# Patient Record
Sex: Female | Born: 1935
Health system: Southern US, Community
[De-identification: ages and names within clinical notes are randomized; demographics above are authoritative.]

## PROBLEM LIST (undated history)

## (undated) DIAGNOSIS — R011 Cardiac murmur, unspecified: Secondary | ICD-10-CM

## (undated) DIAGNOSIS — G4700 Insomnia, unspecified: Secondary | ICD-10-CM

## (undated) DIAGNOSIS — D649 Anemia, unspecified: Secondary | ICD-10-CM

## (undated) DIAGNOSIS — M479 Spondylosis, unspecified: Secondary | ICD-10-CM

## (undated) DIAGNOSIS — F32A Depression, unspecified: Secondary | ICD-10-CM

## (undated) DIAGNOSIS — C801 Malignant (primary) neoplasm, unspecified: Secondary | ICD-10-CM

## (undated) DIAGNOSIS — F419 Anxiety disorder, unspecified: Secondary | ICD-10-CM

## (undated) DIAGNOSIS — E039 Hypothyroidism, unspecified: Secondary | ICD-10-CM

## (undated) DIAGNOSIS — I10 Essential (primary) hypertension: Secondary | ICD-10-CM

## (undated) DIAGNOSIS — F329 Major depressive disorder, single episode, unspecified: Secondary | ICD-10-CM

## (undated) DIAGNOSIS — E079 Disorder of thyroid, unspecified: Secondary | ICD-10-CM

## (undated) DIAGNOSIS — E785 Hyperlipidemia, unspecified: Secondary | ICD-10-CM

## (undated) DIAGNOSIS — J189 Pneumonia, unspecified organism: Secondary | ICD-10-CM

## (undated) HISTORY — DX: Disorder of thyroid, unspecified: E07.9

## (undated) HISTORY — DX: Insomnia, unspecified: G47.00

## (undated) HISTORY — PX: APPENDECTOMY: SHX54

## (undated) HISTORY — DX: Essential (primary) hypertension: I10

## (undated) HISTORY — DX: Hyperlipidemia, unspecified: E78.5

## (undated) HISTORY — DX: Spondylosis, unspecified: M47.9

---

## 1988-06-28 HISTORY — PX: CHOLECYSTECTOMY: SHX55

## 1997-06-28 HISTORY — PX: CERVICAL DISC SURGERY: SHX588

## 1998-04-02 ENCOUNTER — Other Ambulatory Visit: Admission: RE | Admit: 1998-04-02 | Discharge: 1998-04-02 | Payer: Self-pay | Admitting: Family Medicine

## 1999-03-17 ENCOUNTER — Encounter: Admission: RE | Admit: 1999-03-17 | Discharge: 1999-03-30 | Payer: Self-pay | Admitting: Family Medicine

## 1999-07-08 ENCOUNTER — Other Ambulatory Visit: Admission: RE | Admit: 1999-07-08 | Discharge: 1999-07-08 | Payer: Self-pay | Admitting: Family Medicine

## 1999-09-22 ENCOUNTER — Encounter (INDEPENDENT_AMBULATORY_CARE_PROVIDER_SITE_OTHER): Payer: Self-pay

## 1999-09-22 ENCOUNTER — Ambulatory Visit (HOSPITAL_COMMUNITY): Admission: RE | Admit: 1999-09-22 | Discharge: 1999-09-22 | Payer: Self-pay | Admitting: Gastroenterology

## 2000-04-11 ENCOUNTER — Other Ambulatory Visit: Admission: RE | Admit: 2000-04-11 | Discharge: 2000-04-11 | Payer: Self-pay | Admitting: Internal Medicine

## 2000-04-11 ENCOUNTER — Encounter (INDEPENDENT_AMBULATORY_CARE_PROVIDER_SITE_OTHER): Payer: Self-pay

## 2001-07-27 ENCOUNTER — Encounter: Payer: Self-pay | Admitting: Family Medicine

## 2001-07-27 ENCOUNTER — Encounter: Admission: RE | Admit: 2001-07-27 | Discharge: 2001-07-27 | Payer: Self-pay | Admitting: Family Medicine

## 2002-10-09 ENCOUNTER — Encounter: Payer: Self-pay | Admitting: Family Medicine

## 2002-10-09 ENCOUNTER — Encounter: Admission: RE | Admit: 2002-10-09 | Discharge: 2002-10-09 | Payer: Self-pay | Admitting: Family Medicine

## 2003-08-17 ENCOUNTER — Emergency Department (HOSPITAL_COMMUNITY): Admission: EM | Admit: 2003-08-17 | Discharge: 2003-08-18 | Payer: Self-pay | Admitting: Emergency Medicine

## 2004-06-28 HISTORY — PX: CATARACT EXTRACTION, BILATERAL: SHX1313

## 2005-08-12 ENCOUNTER — Ambulatory Visit (HOSPITAL_COMMUNITY): Admission: RE | Admit: 2005-08-12 | Discharge: 2005-08-12 | Payer: Self-pay | Admitting: Ophthalmology

## 2005-10-18 ENCOUNTER — Ambulatory Visit (HOSPITAL_COMMUNITY): Admission: RE | Admit: 2005-10-18 | Discharge: 2005-10-18 | Payer: Self-pay | Admitting: Ophthalmology

## 2006-06-13 ENCOUNTER — Other Ambulatory Visit: Admission: RE | Admit: 2006-06-13 | Discharge: 2006-06-13 | Payer: Self-pay | Admitting: Family Medicine

## 2006-07-06 ENCOUNTER — Encounter: Admission: RE | Admit: 2006-07-06 | Discharge: 2006-07-06 | Payer: Self-pay | Admitting: Family Medicine

## 2007-06-29 HISTORY — PX: KNEE SURGERY: SHX244

## 2007-09-20 ENCOUNTER — Ambulatory Visit: Payer: Self-pay | Admitting: Internal Medicine

## 2007-09-27 LAB — HM COLONOSCOPY: HM Colonoscopy: NORMAL

## 2007-10-04 ENCOUNTER — Ambulatory Visit: Payer: Self-pay | Admitting: Internal Medicine

## 2007-10-04 ENCOUNTER — Encounter: Payer: Self-pay | Admitting: Internal Medicine

## 2007-11-27 ENCOUNTER — Encounter: Admission: RE | Admit: 2007-11-27 | Discharge: 2007-11-27 | Payer: Self-pay | Admitting: Family Medicine

## 2008-01-30 ENCOUNTER — Encounter: Admission: RE | Admit: 2008-01-30 | Discharge: 2008-03-05 | Payer: Self-pay | Admitting: Orthopedic Surgery

## 2008-08-20 LAB — HM PAP SMEAR: HM Pap smear: NORMAL

## 2009-06-28 HISTORY — PX: TOTAL HIP ARTHROPLASTY: SHX124

## 2009-08-19 ENCOUNTER — Inpatient Hospital Stay (HOSPITAL_COMMUNITY): Admission: RE | Admit: 2009-08-19 | Discharge: 2009-08-23 | Payer: Self-pay | Admitting: Orthopedic Surgery

## 2010-05-08 ENCOUNTER — Encounter: Payer: Self-pay | Admitting: Internal Medicine

## 2010-05-26 ENCOUNTER — Encounter: Admission: RE | Admit: 2010-05-26 | Discharge: 2010-05-26 | Payer: Self-pay | Admitting: Family Medicine

## 2010-06-05 ENCOUNTER — Encounter (INDEPENDENT_AMBULATORY_CARE_PROVIDER_SITE_OTHER): Payer: Self-pay | Admitting: *Deleted

## 2010-06-11 ENCOUNTER — Ambulatory Visit: Payer: Self-pay | Admitting: Internal Medicine

## 2010-07-02 ENCOUNTER — Ambulatory Visit
Admission: RE | Admit: 2010-07-02 | Discharge: 2010-07-02 | Payer: Self-pay | Source: Home / Self Care | Attending: Internal Medicine | Admitting: Internal Medicine

## 2010-07-02 ENCOUNTER — Encounter: Payer: Self-pay | Admitting: Internal Medicine

## 2010-07-06 ENCOUNTER — Encounter: Payer: Self-pay | Admitting: Internal Medicine

## 2010-07-18 ENCOUNTER — Encounter: Payer: Self-pay | Admitting: Family Medicine

## 2010-07-30 NOTE — Miscellaneous (Signed)
Summary: RECALL COLON/YF  Clinical Lists Changes  Medications: Added new medication of MOVIPREP 100 GM  SOLR (PEG-KCL-NACL-NASULF-NA ASC-C) As per prep instructions. - Signed Rx of MOVIPREP 100 GM  SOLR (PEG-KCL-NACL-NASULF-NA ASC-C) As per prep instructions.;  #1 x 0;  Signed;  Entered by: Clide Cliff RN;  Authorized by: Hilarie Fredrickson MD;  Method used: Print then Give to Patient Observations: Added new observation of ALLERGY REV: Done (06/11/2010 15:30)    Prescriptions: MOVIPREP 100 GM  SOLR (PEG-KCL-NACL-NASULF-NA ASC-C) As per prep instructions.  #1 x 0   Entered by:   Clide Cliff RN   Authorized by:   Hilarie Fredrickson MD   Signed by:   Clide Cliff RN on 06/11/2010   Method used:   Print then Give to Patient   RxID:   [id number]

## 2010-07-30 NOTE — Procedures (Signed)
Summary: Colonoscopy  Patient: [name] Note: All result statuses are Final unless otherwise noted.  Tests: (1) Colonoscopy (COL)   COL Colonoscopy           [facility]      [facility]     [address]     [place], [place]  [address]           COLONOSCOPY PROCEDURE REPORT           PATIENT:  Brenda Solis, Brenda Solis  MR#:  [id number]     BIRTHDATE:  1934/07/27, 75 yrs. old  GENDER:  female     ENDOSCOPIST:  Wilhemina Bonito. Eda Keys, MD     REF. BY:  Surveillance Program Recall,     PROCEDURE DATE:  07/02/2010     PROCEDURE:  Colonoscopy with snare polypectomy x 2,     Colonoscopy with submucosal     injection     ASA CLASS:  Class II     INDICATIONS:  history of pre-cancerous (adenomatous) colon polyps,     surveillance and high-risk screening ; index 2001 w/ large TVA;     f/u 2001; f/u 2009 w/ large TVA (pt overdue for followup - "put it     off")     MEDICATIONS:   Fentanyl 125 mcg IV, Versed 15 mg IV           DESCRIPTION OF PROCEDURE:   After the risks benefits and     alternatives of the procedure were thoroughly explained, informed     consent was obtained.  Digital rectal exam was performed and     revealed no abnormalities.   The LB 180AL [id number] endoscope was     introduced through the anus and advanced to the cecum, which was     identified by both the appendix and ileocecal valve, without     limitations.Time to cecum = 3:53 min.  The quality of the prep was     excellent, using MoviPrep.  The instrument was then slowly     withdrawn (time = 23:04 min) as the colon was fully examined.     <<PROCEDUREIMAGES>>           FINDINGS:  A 20-71mm sessile polyp was found in the cecum.     Submucosal injection of normal saline was used to raise the lesion     entirely. Polyp was snared, then cauterized with monopolar cautery     and removed in piecemeal fashion. Retrieval was successful.   A     diminutive polyp was found at the hepatic flexure. Polyp was     snared without cautery.  Retrieval was not attempted.  Otherwise     normal colonoscopy without other polyps, masses, vascular     ectasias, or inflammatory changes.   Retroflexed views in the     rectum revealed no abnormalities.    The scope was then withdrawn     from the patient and the procedure completed.           COMPLICATIONS:  None           ENDOSCOPIC IMPRESSION:     1) Large Sessile polyp in the cecum - removed     2) Diminutive polyp at the hepatic flexure - removed     3) Otherwise normal colonoscopy           RECOMMENDATIONS:     1) No ASA or NSAIDS x 2 weeks     2) Follow  up colonoscopy in 6 MONTHS           ______________________________     Wilhemina Bonito. Eda Keys, MD           CC:  Bennie Pierini, NP;  The Patient           n.     eSIGNED:   Myelle Poteat N. Eda Keys at 07/02/2010 10:05 AM           Rogelia Mire, [id number]  Note: An exclamation mark (!) indicates a result that was not dispersed into the flowsheet. Document Creation Date: 07/02/2010 10:06 AM _______________________________________________________________________  (1) Order result status: Final Collection or observation date-time: 07/02/2010 09:47 Requested date-time:  Receipt date-time:  Reported date-time:  Referring Physician:   Ordering Physician: Fransico Setters [id number]) Specimen Source:  Source: Launa Grill Order Number: [id number] Lab site:   Appended Document: Colonoscopy     Procedures Next Due Date:    Colonoscopy: 12/2010

## 2010-07-30 NOTE — Letter (Signed)
Summary: Pre Visit Letter Revised  [facility]  [address] [place], [place] [address]   Phone: [phone]  Fax: [phone]        05/08/2010 MRN: [id number]  Brenda Solis [address] [place], [place]  [address]             Procedure Date:  07-02-10 9am           Dr Marina Goodell   Welcome to the Gastroenterology Division at [facility].    You are scheduled to see a nurse for your pre-procedure visit on 06-11-10 at 3:30pm on the 3rd floor at [facility], [address]  We ask that you try to arrive at our office 15 minutes prior to your appointment time to allow for check-in.  Please take a minute to review the attached form.  If you answer "Yes" to one or more of the questions on the first page, we ask that you call the person listed at your earliest opportunity.  If you answer "No" to all of the questions, please complete the rest of the form and bring it to your appointment.    Your nurse visit will consist of discussing your medical and surgical history, your immediate family medical history, and your medications.   If you are unable to list all of your medications on the form, please bring the medication bottles to your appointment and we will list them.  We will need to be aware of both prescribed and over the counter drugs.  We will need to know exact dosage information as well.    Please be prepared to read and sign documents such as consent forms, a financial agreement, and acknowledgement forms.  If necessary, and with your consent, a friend or relative is welcome to sit-in on the nurse visit with you.  Please bring your insurance card so that we may make a copy of it.  If your insurance requires a referral to see a specialist, please bring your referral form from your primary care physician.  No co-pay is required for this nurse visit.     If you cannot keep your appointment, please call [phone] to cancel or reschedule prior to your appointment date.   This allows [place] the opportunity to schedule an appointment for another patient in need of care.    Thank you for choosing [facility] for your medical needs.  We appreciate the opportunity to care for you.  Please visit [place] at our website  to learn more about our practice.  Sincerely, The Gastroenterology Division

## 2010-07-30 NOTE — Letter (Signed)
Summary: Patient Notice- Polyp Results  [facility]  [address] [place], [place] [address]   Phone: [phone]  Fax: [phone]        July 06, 2010 MRN: [id number]    Brenda Solis [address] [place], [place]  [address]    Dear Ms. [place],  I am pleased to inform you that the colon polyps removed during your recent colonoscopy were found to be benign (no cancer detected) upon pathologic examination.  I recommend you have a repeat colonoscopy examination in 6 months to look for recurrent polyps, as having colon polyps increases your risk for having recurrent polyps or even colon cancer in the future.  Should you develop new or worsening symptoms of abdominal pain, bowel habit changes or bleeding from the rectum or bowels, please schedule an evaluation with either your primary care physician or with me.  Additional information/recommendations:  __ No further action with gastroenterology is needed at this time. Please      follow-up with your primary care physician for your other healthcare      needs.   Please call us if you are having persistent problems or have questions about your condition that have not been fully answered at this time.  Sincerely,  Hilarie Fredrickson MD  This letter has been electronically signed by your physician.  Appended Document: Patient Notice- Polyp Results Letter mailed

## 2010-07-30 NOTE — Letter (Signed)
Summary: [facility] Instructions  [facility]  [address] [place], [place] [address]   Phone: [phone]  Fax: [phone]       Brenda Solis    [birth year]    MRN: [id number]        Procedure Day [name]:  [name]  07/02/10     Arrival Time:  8:00AM     Procedure Time:  9:00AM     Location of Procedure:                    _ X_  [facility] (4th Floor)                      PREPARATION FOR COLONOSCOPY WITH MOVIPREP   Starting 5 days prior to your procedure 06/27/10 do not eat nuts, seeds, popcorn, corn, beans, peas,  salads, or any raw vegetables.  Do not take any fiber supplements (e.g. Metamucil, Citrucel, and Benefiber).  THE DAY BEFORE YOUR PROCEDURE         DATE: 07/01/10  DAY: WEDNESDAY  1.  Drink clear liquids the entire day-NO SOLID FOOD  2.  Do not drink anything colored red or purple.  Avoid juices with pulp.  No orange juice.  3.  Drink at least 64 oz. (8 glasses) of fluid/clear liquids during the day to prevent dehydration and help the prep work efficiently.  CLEAR LIQUIDS INCLUDE: Water Jello Ice Popsicles Tea (sugar ok, no milk/cream) Powdered fruit flavored drinks Coffee (sugar ok, no milk/cream) Gatorade Juice: apple, white grape, white cranberry  Lemonade Clear bullion, consomm, broth Carbonated beverages (any kind) Strained chicken noodle soup Hard Candy                             4.  In the morning, mix first dose of MoviPrep solution:    Empty 1 Pouch A and 1 Pouch B into the disposable container    Add lukewarm drinking water to the top line of the container. Mix to dissolve    Refrigerate (mixed solution should be used within 24 hrs)  5.  Begin drinking the prep at 5:00 p.m. The MoviPrep container is divided by 4 marks.   Every 15 minutes drink the solution down to the next mark (approximately 8 oz) until the full liter is complete.   6.  Follow completed prep with 16 oz of clear liquid of your choice (Nothing  red or purple).  Continue to drink clear liquids until bedtime.  7.  Before going to bed, mix second dose of MoviPrep solution:    Empty 1 Pouch A and 1 Pouch B into the disposable container    Add lukewarm drinking water to the top line of the container. Mix to dissolve    Refrigerate  THE DAY OF YOUR PROCEDURE      DATE: 07/02/10   DAY: THURSDAY  Beginning at 4:00AM (5 hours before procedure):         1. Every 15 minutes, drink the solution down to the next mark (approx 8 oz) until the full liter is complete.  2. Follow completed prep with 16 oz. of clear liquid of your choice.    3. You may drink clear liquids until 7:00AM (2 HOURS BEFORE PROCEDURE).   MEDICATION INSTRUCTIONS  Unless otherwise instructed, you should take regular prescription medications with a small sip of water   as early as possible the morning of  your procedure.         OTHER INSTRUCTIONS  You will need a responsible adult at least 74 years of age to accompany you and drive you home.   This person must remain in the waiting room during your procedure.  Wear loose fitting clothing that is easily removed.  Leave jewelry and other valuables at home.  However, you may wish to bring a book to read or  an iPod/MP3 player to listen to music as you wait for your procedure to start.  Remove all body piercing jewelry and leave at home.  Total time from sign-in until discharge is approximately 2-3 hours.  You should go home directly after your procedure and rest.  You can resume normal activities the  day after your procedure.  The day of your procedure you should not:   Drive   Make legal decisions   Operate machinery   Drink alcohol   Return to work  You will receive specific instructions about eating, activities and medications before you leave.    The above instructions have been reviewed and explained to me by   Clide Cliff, RN______________________    I fully understand and can  verbalize these instructions _____________________________ Date _________

## 2010-09-18 LAB — DIFFERENTIAL
Basophils Absolute: 0 10*3/uL (ref 0.0–0.1)
Basophils Relative: 1 % (ref 0–1)
Lymphocytes Relative: 36 % (ref 12–46)
Monocytes Relative: 9 % (ref 3–12)
Neutrophils Relative %: 53 % (ref 43–77)

## 2010-09-18 LAB — URINALYSIS, ROUTINE W REFLEX MICROSCOPIC
Glucose, UA: NEGATIVE mg/dL
Hgb urine dipstick: NEGATIVE
Ketones, ur: NEGATIVE mg/dL
Protein, ur: NEGATIVE mg/dL

## 2010-09-18 LAB — HEMOGLOBIN AND HEMATOCRIT, BLOOD
HCT: 28.1 % — ABNORMAL LOW (ref 36.0–46.0)
HCT: 28.6 % — ABNORMAL LOW (ref 36.0–46.0)
Hemoglobin: 10 g/dL — ABNORMAL LOW (ref 12.0–15.0)
Hemoglobin: 10.2 g/dL — ABNORMAL LOW (ref 12.0–15.0)
Hemoglobin: 10.4 g/dL — ABNORMAL LOW (ref 12.0–15.0)
Hemoglobin: 9.8 g/dL — ABNORMAL LOW (ref 12.0–15.0)

## 2010-09-18 LAB — CBC
Platelets: 232 10*3/uL (ref 150–400)
RDW: 13.8 % (ref 11.5–15.5)

## 2010-09-18 LAB — COMPREHENSIVE METABOLIC PANEL
Albumin: 4.3 g/dL (ref 3.5–5.2)
Alkaline Phosphatase: 53 U/L (ref 39–117)
BUN: 15 mg/dL (ref 6–23)
GFR calc non Af Amer: 59 mL/min — ABNORMAL LOW (ref >60)
Potassium: 4.3 mEq/L (ref 3.5–5.1)
Sodium: 138 mEq/L (ref 135–145)
Total Protein: 7.9 g/dL (ref 6.0–8.3)

## 2010-09-18 LAB — PROTIME-INR
INR: 1.01 (ref 0.00–1.49)
INR: 1.11 (ref 0.00–1.49)
INR: 1.39 (ref 0.00–1.49)
Prothrombin Time: 13.2 seconds (ref 11.6–15.2)
Prothrombin Time: 14.2 seconds (ref 11.6–15.2)
Prothrombin Time: 14.9 seconds (ref 11.6–15.2)
Prothrombin Time: 18.2 seconds — ABNORMAL HIGH (ref 11.6–15.2)

## 2010-09-18 LAB — TYPE AND SCREEN: Antibody Screen: NEGATIVE

## 2010-10-14 ENCOUNTER — Encounter: Payer: Self-pay | Admitting: Nurse Practitioner

## 2010-10-14 DIAGNOSIS — K2970 Gastritis, unspecified, without bleeding: Secondary | ICD-10-CM

## 2010-10-14 DIAGNOSIS — M479 Spondylosis, unspecified: Secondary | ICD-10-CM

## 2010-10-14 DIAGNOSIS — E039 Hypothyroidism, unspecified: Secondary | ICD-10-CM

## 2010-10-14 DIAGNOSIS — G4700 Insomnia, unspecified: Secondary | ICD-10-CM

## 2010-10-27 ENCOUNTER — Ambulatory Visit
Admission: RE | Admit: 2010-10-27 | Discharge: 2010-10-27 | Disposition: A | Discharge status: Home / Self Care | Payer: Medicare Other | Source: Ambulatory Visit | Attending: Family Medicine | Admitting: Family Medicine

## 2010-10-27 ENCOUNTER — Other Ambulatory Visit: Payer: Self-pay | Admitting: Family Medicine

## 2010-10-27 DIAGNOSIS — R55 Syncope and collapse: Secondary | ICD-10-CM

## 2010-10-27 MED ORDER — IOHEXOL 300 MG/ML  SOLN
75.0000 mL | Freq: Once | INTRAMUSCULAR | Status: AC | PRN
  Administered 2010-10-27: 75 mL via INTRAVENOUS

## 2010-11-03 ENCOUNTER — Ambulatory Visit: Payer: Medicare Other | Admitting: Cardiology

## 2010-11-13 NOTE — Op Note (Signed)
NAME:  Brenda Solis, Brenda Solis                 ACCOUNT NO.:  [id number]   MEDICAL RECORD NO.:  [id number]          PATIENT TYPE:  AMB   LOCATION:  DAY                           FACILITY:  [facility]   PHYSICIAN:  Trish Fountain, MD    DATE OF BIRTH:  01/10/1935   DATE OF PROCEDURE:  10/18/2005  DATE OF DISCHARGE:                                 OPERATIVE REPORT   PREOPERATIVE DIAGNOSIS:  Cataract, right eye.   POSTOPERATIVE DIAGNOSIS:  Cataract, right eye.   SURGERY:  Kelman phacoemulsification, right eye, with posterior chamber  intraocular lens, right eye.   ANESTHESIA:  MAC with topical anesthesia of the right eye.   SURGEON:  Trish Fountain, M.D.   SPECIMENS:  None.   COMPLICATIONS:  None.   LENS MODEL:  AMO model A945967, 17.5 Diopter lens, serial # [id number].   HISTORY:  This is a 76 year old female with progressive decreased vision in  the right eye.   DESCRIPTION OF PROCEDURE:  In the preoperative area, the patient had  Cyclogyl and Neo-Synephrine drops in the right eye in order to dilate the  eye along with Tetracaine to help anesthetize the eye.  Once the patient's  right eye was dilated, the patient was taken to the operating room and  prepped.  The right eye was prepped and draped in the usual sterile manner.  A lid speculum was placed in the right eye, and 2% Xylocaine jelly was  placed in the right eye as well.  A paracentesis was made through clear  cornea at the limbus at approximately the 11 o'clock position of the right  eye.  Nonpreserved Xylocaine 1% 1 cc was placed into the anterior chamber  for one minute.  Viscoat was then used to fill the anterior chamber.  Using  a 2.75 mm blade at the 9 o'clock position, an incision into the anterior  chamber was made through clear cornea near the limbus.  Viscoat was again  used to reform the anterior chamber.  A 25 gauge bent capsulotomy needle was  used to begin the capsulorrhexis through the anterior capsule of the lens.  Utrata forceps were used to make a 360 degree anterior capsulorrhexis.  A  Chang 27 gauge irrigating cannula was used to hydrodissect and  hydrodelineate the nucleus.  Once hydrodissection and hydrodelineation was  carried out, [facility] phacoemulsification was used to make a deep groove in  the lens nucleus.  The lens was rotated 360 degrees and divided into four  quadrants using deep grooves made by phacoemulsification with the [facility]  phacoemulsification tip.  The nucleus was then divided using the phaco tip  and the nucleus manipulator.  The nuclear quadrants were then removed using  phacoemulsification.  The irrigation aspiration was then used to remove the  remainder of the cortex.  The anterior chamber and posterior capsule was  filled with Provisc, and the 9 o'clock position incision was slightly  widened, using the same 2.75 mm blade that was initially used to make the  incision.  An intraocular lens was placed in the shooter, and this was  placed in the eye, followed by placement of the trailing haptic into the  posterior capsule, using the Kugelan.  Irrigation/aspiration was then used  to remove Provisc from the anterior chamber and the posterior capsule.  BSS  on a syringe was then used to hydrate the cornea at the 9 o'clock incision  site.  The incision site was then checked for water tightness, using a Weck-  cel.  Half-strength Betadine solution was placed, 1 drop, in the inner  canthus, and 1 drop in the outer canthus.  After one minute, this was rinsed  from the eye.  Drops were placed in the eye, Vigamox, followed by Nevanac  followed by Econopred.  A shield was placed over the patient's right eye,  and the patient was sent to the recovery room in satisfactory condition.      Trish Fountain, MD  Electronically Signed     PVK/MEDQ  D:  10/18/2005  T:  10/19/2005  Job:  [id number]

## 2010-11-13 NOTE — Op Note (Signed)
NAME:  Brenda Solis, Brenda Solis                 ACCOUNT NO.:  [id number]   MEDICAL RECORD NO.:  [id number]          PATIENT TYPE:  AMB   LOCATION:  DAY                           FACILITY:  [facility]   PHYSICIAN:  Trish Fountain, MD    DATE OF BIRTH:  1935/04/20   DATE OF PROCEDURE:  08/12/2005  DATE OF DISCHARGE:                                 OPERATIVE REPORT   /PREOPERATIVE DIAGNOSIS:  Cataract, left eye.   POSTOPERATIVE DIAGNOSIS:  Cataract, left eye.   SURGERY:  Kelman phacoemulsification, left eye, with posterior chamber  intraocular lens, left eye.   ANESTHESIA:  MAC with topical anesthesia of the left eye.   SURGEON:  Trish Fountain, MD   SPECIMENS:  None.   COMPLICATIONS:  None.   HISTORY:  This is a 76 year old female who has slowly progressive decrease  in vision in the left eye.   LENS MODEL:  AMO [id number], 17.0 diopter lens, serial #[id number].   DESCRIPTION OF PROCEDURE:  In the preoperative area, the patient had  Cyclogyl and Neo-Synephrine drops in the left eye in order to dilate the eye  along with Tetracaine to help anesthetize the eye.  Once the patient's left  eye was dilated, the patient was taken to the operating room and prepped.  The left eye was prepped and draped in the usual sterile manner.  A lid  speculum was placed in the left eye, and 2% Xylocaine jelly was placed in  the left eye as well.  A paracentesis was made through clear cornea at the  limbus at approximately the 5 o'clock position of the left eye.  Nonpreserved Xylocaine 1% 1 cc was placed into the anterior chamber for one  minute.  Viscoat was then used to fill the anterior chamber.  Using a 2.75  mm blade at the 3 o'clock position, an incision into the anterior chamber  was made through clear cornea near the limbus.  Viscoat was again used to  reform the anterior chamber.  A 25 gauge bent capsulotomy needle was used to  begin the capsulorrhexis through the anterior capsule of the lens.  Utrata  forceps were used to make a 360 degree anterior capsulorrhexis.  A Chang 27  gauge irrigating cannula was used to hydrodissect and hydrodelineate the  nucleus.  Once hydrodissection and hydrodelineation was carried out, [facility]  phacoemulsification was used to make a deep groove in the lens nucleus.  The  lens was rotated 360 degrees and divided into four quadrants using deep  grooves made by phacoemulsification with the [facility] phacoemulsification tip.  The nucleus was then divided using the phaco tip and the nucleus  manipulator.  The nuclear quadrants were then removed using  phacoemulsification.  The irrigation aspiration was then used to remove the  remainder of the cortex.  The anterior chamber and posterior capsule was  filled with Provisc, and the 3 o'clock position incision was slightly  widened, using the same 2.75 mm blade that was initially used to make the  incision.  An intraocular lens was placed in the shooter, and this was  placed in the eye, followed by placement of the trailing haptic into the  posterior capsule, using the Kugelan.  Irrigation/aspiration was then used  to remove Provisc from the anterior chamber and the posterior capsule.  BSS  on a syringe was then used to hydrate the cornea at the 3 o'clock incision  site.  The incision site was then checked for water tightness, using a Weck-  cel.  Half-strength Betadine solution was placed, 1 drop, in the inner  canthus, and 1 drop in the outer canthus.  After one minute, this was rinsed  from the eye.  Drops were placed in the eye, Vigamox, followed by Nevanac  followed by Econopred.  A shield was placed over the patient's left eye, and  the patient was sent to the recovery room in satisfactory condition.      Trish Fountain, MD  Electronically Signed     PVK/MEDQ  D:  08/13/2005  T:  08/13/2005  Job:  [id number]

## 2010-11-16 ENCOUNTER — Encounter: Payer: Self-pay | Admitting: Cardiology

## 2010-11-17 ENCOUNTER — Encounter: Payer: Self-pay | Admitting: Cardiology

## 2010-11-17 ENCOUNTER — Ambulatory Visit (INDEPENDENT_AMBULATORY_CARE_PROVIDER_SITE_OTHER): Payer: Medicare Other | Admitting: Cardiology

## 2010-11-17 ENCOUNTER — Encounter (INDEPENDENT_AMBULATORY_CARE_PROVIDER_SITE_OTHER): Payer: Medicare Other

## 2010-11-17 DIAGNOSIS — I10 Essential (primary) hypertension: Secondary | ICD-10-CM

## 2010-11-17 DIAGNOSIS — R55 Syncope and collapse: Secondary | ICD-10-CM

## 2010-11-17 MED ORDER — METOPROLOL SUCCINATE ER 25 MG PO TB24: 25.0000 mg | ORAL_TABLET | Freq: Every day | ORAL | Status: DC

## 2010-11-17 NOTE — Patient Instructions (Signed)
Your physician has requested that you have a dobutamine echocardiogram. For further information please visit [email or web address] Please follow instruction sheet as given.  Do not take your toprol the morning of your test.  Your physician has recommended that you wear an event monitor. Event monitors are medical devices that record the heart's electrical activity. Doctors most often [place] these monitors to diagnose arrhythmias. Arrhythmias are problems with the speed or rhythm of the heartbeat. The monitor is a small, portable device. You can wear one while you do your normal daily activities. This is usually used to diagnose what is causing palpitations/syncope (passing out). Cardionet monitor  Your physician recommends that you schedule a follow-up appointment in: 6 weeks with Dr. Jens Som Your physician has recommended you make the following change in your medication: STOP:  HCTZ    START: Toprol

## 2010-11-17 NOTE — Progress Notes (Signed)
HPI: 76 year old female with no prior cardiac history or evaluation of syncope. Patient had a syncopal episode in early May. CT of the head was unremarkable. Patient states that in December she had a syncopal episode. She had a leg cramp and when she stood she felt suddenly dizzy and had frank syncope. In early May she was in her kitchen. She was having a bout of vertigo. She developed nausea followed by syncope. There was no preceding chest pain, palpitations, dyspnea, or neurological symptoms. She is unclear about the duration of her unconsciousness but it appears to have been relatively short. She has had no problems since then. She denies dyspnea on exertion, orthopnea, PND, pedal edema or exertional chest pain. Because of her syncope we were asked to further evaluate.  Current Outpatient Prescriptions  Medication Sig Dispense Refill  . atorvastatin (LIPITOR) 40 MG tablet Take 40 mg by mouth daily.        . cholecalciferol (VITAMIN D) 1000 UNITS tablet Take 2,000 Units by mouth daily.        . hydrochlorothiazide 25 MG tablet Take 25 mg by mouth daily.        Marland Kitchen levothyroxine (SYNTHROID, LEVOTHROID) 25 MCG tablet Take 25 mcg by mouth daily.        . traMADol-acetaminophen (ULTRACET) 37.5-325 MG per tablet Take 1 tablet by mouth every 6 (six) hours as needed.        . zolpidem (AMBIEN) 10 MG tablet Take 10 mg by mouth at bedtime as needed.        . meclizine (ANTIVERT) 25 MG tablet Take 25 mg by mouth 3 (three) times daily as needed.        Marland Kitchen DISCONTD: aspirin 81 MG tablet Take 81 mg by mouth daily.        Marland Kitchen DISCONTD: benazepril-hydrochlorthiazide (LOTENSIN HCT) 10-12.5 MG per tablet Take 1 tablet by mouth daily.        Marland Kitchen DISCONTD: diclofenac (VOLTAREN) 75 MG EC tablet Take 75 mg by mouth 2 (two) times daily.        Marland Kitchen DISCONTD: DULoxetine (CYMBALTA) 60 MG capsule Take 60 mg by mouth daily.          No Known Allergies  Past Medical History  Diagnosis Date  . Thyroid disease     hypothyroidism    . Insomnia, unspecified   . Spondylosis of unspecified site without mention of myelopathy   . Hypertension   . Hyperlipidemia     Past Surgical History  Procedure Date  . Total hip arthroplasty     RIGHT  . Cataract extraction, bilateral   . Knee surgery   . Cholecystectomy   . Cervical disc surgery   . Cesarean section     History   Social History  . Marital Status: Widowed    Spouse Name: N/A    Number of Children: 3  . Years of Education: N/A   Occupational History  .     Social History Main Topics  . Smoking status: Never Smoker   . Smokeless tobacco: Not on file  . Alcohol Use: No  . Drug Use: No  . Sexually Active: Not on file   Other Topics Concern  . Not on file   Social History Narrative  . No narrative on file    Family History  Problem Relation Age of Onset  . Stroke      FAMILY  . Esophageal varices      FAMILY  . Coronary artery disease  Brother     70s    ROS: no fevers or chills, productive cough, hemoptysis, dysphasia, odynophagia, melena, hematochezia, dysuria, hematuria, rash, seizure activity, orthopnea, PND, pedal edema, claudication. Remaining systems are negative.  Physical Exam: General:  Well developed/well nourished in NAD Skin warm/dry Patient not depressed No peripheral clubbing Back-normal HEENT-normal/normal eyelids Neck supple/normal carotid upstroke bilaterally; no bruits; no JVD; no thyromegaly chest - CTA/ normal expansion CV - RRR/normal S1 and S2; no murmurs, rubs or gallops;  PMI nondisplaced, no change with Valsalva. Abdomen -NT/ND, no HSM, no mass, + bowel sounds, no bruit 2+ femoral pulses, no bruits Ext-no edema, chords, 2+ DP Neuro-grossly nonfocal  ECG Sinus rhythm at a rate of 79. Nonspecific T-wave changes.

## 2010-11-17 NOTE — Assessment & Plan Note (Signed)
Etiology unclear but this may have been a vagal episode. Schedule dobutamine echocardiogram to quantify LV function and exclude ischemia. Schedule [facility]. Discontinue HCTZ and treat hypertension with metoprolol 25 mg daily. Patient instructed not to drive until above testing complete.

## 2010-11-17 NOTE — Assessment & Plan Note (Signed)
Discontinued HCTZ and begin Toprol 25 mg daily.

## 2010-11-30 ENCOUNTER — Ambulatory Visit (HOSPITAL_BASED_OUTPATIENT_CLINIC_OR_DEPARTMENT_OTHER): Payer: Medicare Other | Admitting: Radiology

## 2010-11-30 ENCOUNTER — Ambulatory Visit (HOSPITAL_COMMUNITY): Payer: Medicare Other | Attending: Cardiology | Admitting: Radiology

## 2010-11-30 DIAGNOSIS — R55 Syncope and collapse: Secondary | ICD-10-CM | POA: Insufficient documentation

## 2010-11-30 DIAGNOSIS — R0989 Other specified symptoms and signs involving the circulatory and respiratory systems: Secondary | ICD-10-CM

## 2010-11-30 DIAGNOSIS — I10 Essential (primary) hypertension: Secondary | ICD-10-CM | POA: Insufficient documentation

## 2010-11-30 MED ORDER — SODIUM CHLORIDE 0.9 % IV SOLN: 30.0000 ug/kg | INTRAVENOUS | Status: DC

## 2010-12-01 ENCOUNTER — Telehealth: Payer: Self-pay | Admitting: Cardiology

## 2010-12-01 NOTE — Telephone Encounter (Signed)
Discuss stress test results from yesterday

## 2010-12-02 NOTE — Telephone Encounter (Signed)
Spoke with pt, she is aware of test results Brenda Solis

## 2010-12-03 ENCOUNTER — Telehealth: Payer: Self-pay | Admitting: Cardiology

## 2010-12-03 NOTE — Telephone Encounter (Signed)
Spoke with pt, questions answered Brenda Solis  

## 2010-12-15 ENCOUNTER — Telehealth: Payer: Self-pay | Admitting: *Deleted

## 2010-12-15 ENCOUNTER — Telehealth: Payer: Self-pay | Admitting: Cardiology

## 2010-12-15 NOTE — Telephone Encounter (Signed)
Spoke with pt, made aware monitor reviewed by dr Jens Som shows normal sinus rhythm Deliah Goody

## 2010-12-15 NOTE — Telephone Encounter (Signed)
Pt wants to know can she drive or wait until office visit.

## 2010-12-15 NOTE — Telephone Encounter (Signed)
Spoke with pt, will forward for dr Jens Som review Deliah Goody

## 2010-12-16 NOTE — Telephone Encounter (Signed)
Spoke with pt, she is aware she can drive [employer]

## 2010-12-16 NOTE — Telephone Encounter (Signed)
Ok to drive  Brenda Solis    

## 2010-12-24 ENCOUNTER — Ambulatory Visit: Payer: Medicare Other | Admitting: Cardiology

## 2010-12-28 ENCOUNTER — Ambulatory Visit: Payer: Medicare Other | Admitting: Cardiology

## 2011-01-12 ENCOUNTER — Encounter: Payer: Self-pay | Admitting: Internal Medicine

## 2011-01-14 ENCOUNTER — Ambulatory Visit (INDEPENDENT_AMBULATORY_CARE_PROVIDER_SITE_OTHER): Payer: Medicare Other | Admitting: Cardiology

## 2011-01-14 ENCOUNTER — Encounter: Payer: Self-pay | Admitting: Cardiology

## 2011-01-14 DIAGNOSIS — R55 Syncope and collapse: Secondary | ICD-10-CM

## 2011-01-14 DIAGNOSIS — I10 Essential (primary) hypertension: Secondary | ICD-10-CM

## 2011-01-14 NOTE — Assessment & Plan Note (Signed)
Blood pressure mildly elevated. Continue Toprol. She will monitor this and the dose can be increased if needed. We'll avoid diuretics as this may exacerbate syncope.

## 2011-01-14 NOTE — Progress Notes (Signed)
HPI:76 year old female with no prior cardiac history I saw in May of 2012 for evaluation of syncope. Patient had a syncopal episode in early May. CT of the head was unremarkable. Patient states that in December she had a syncopal episode. She had a leg cramp and when she stood she felt suddenly dizzy and had frank syncope. In early May she was in her kitchen. She was having a bout of vertigo. She developed nausea followed by syncope. There was no preceding chest pain, palpitations, dyspnea, or neurological symptoms. She is unclear about the duration of her unconsciousness but it appears to have been relatively short. Dobutamine echo in June of 2012 revealed no ischemia and normal LV function. Cardionet revealed sinus rhythm. Since then, the patient denies any dyspnea on exertion, orthopnea, PND, pedal edema, palpitations, syncope or chest pain.    Current Outpatient Prescriptions  Medication Sig Dispense Refill  . ALPRAZolam (XANAX) 0.25 MG tablet Take 0.25 mg by mouth at bedtime as needed.        Marland Kitchen atorvastatin (LIPITOR) 40 MG tablet Take 40 mg by mouth daily.        . cholecalciferol (VITAMIN D) 1000 UNITS tablet Take 2,000 Units by mouth daily.        Marland Kitchen levothyroxine (SYNTHROID, LEVOTHROID) 25 MCG tablet Take 25 mcg by mouth daily.        . meclizine (ANTIVERT) 25 MG tablet Take 25 mg by mouth 3 (three) times daily as needed.        . metoprolol succinate (TOPROL XL) 25 MG 24 hr tablet Take 1 tablet (25 mg total) by mouth daily.  30 tablet  11  . traMADol-acetaminophen (ULTRACET) 37.5-325 MG per tablet Take 1 tablet by mouth every 6 (six) hours as needed.        . zolpidem (AMBIEN) 10 MG tablet Take 10 mg by mouth at bedtime as needed.         No current facility-administered medications for this visit.   Facility-Administered Medications Ordered in Other Visits  Medication Dose Route Frequency Provider Last Rate Last Dose  . DOBUTamine (DOBUTREX) 1,000 mcg/mL in sodium chloride 0.9 % 150 mL  infusion  30 mcg/kg Intravenous Continuous Lewayne Bunting, MD   30 mcg/kg/min at 12/02/10 0815     Past Medical History  Diagnosis Date  . Thyroid disease     hypothyroidism  . Insomnia, unspecified   . Spondylosis of unspecified site without mention of myelopathy   . Hypertension   . Hyperlipidemia     Past Surgical History  Procedure Date  . Total hip arthroplasty     RIGHT  . Cataract extraction, bilateral   . Knee surgery   . Cholecystectomy   . Cervical disc surgery   . Cesarean section     History   Social History  . Marital Status: Widowed    Spouse Name: N/A    Number of Children: 3  . Years of Education: N/A   Occupational History  .     Social History Main Topics  . Smoking status: Never Smoker   . Smokeless tobacco: Not on file  . Alcohol Use: No  . Drug Use: No  . Sexually Active: Not on file   Other Topics Concern  . Not on file   Social History Narrative  . No narrative on file    ROS: no fevers or chills, productive cough, hemoptysis, dysphasia, odynophagia, melena, hematochezia, dysuria, hematuria, rash, seizure activity, orthopnea, PND, pedal edema, claudication. Remaining  systems are negative.  Physical Exam: Well-developed well-nourished in no acute distress.  Skin is warm and dry.  HEENT is normal.  Neck is supple. No thyromegaly.  Chest is clear to auscultation with normal expansion.  Cardiovascular exam is regular rate and rhythm.  Abdominal exam nontender or distended. No masses palpated. Extremities show no edema. neuro grossly intact

## 2011-01-14 NOTE — Assessment & Plan Note (Signed)
Etiology unclear. Possible vagal episode. Dobutamine echocardiogram normal. Cardionet with no significant arrhythmia. Will continue off of HCTZ. No further workup unless recurrent episodes in the future.

## 2011-12-05 ENCOUNTER — Other Ambulatory Visit: Payer: Self-pay | Admitting: Cardiology

## 2012-03-28 ENCOUNTER — Encounter: Payer: Self-pay | Admitting: Internal Medicine

## 2012-09-18 ENCOUNTER — Telehealth: Payer: Self-pay | Admitting: Nurse Practitioner

## 2012-09-18 NOTE — Telephone Encounter (Signed)
Patient would like celebrex samples if we have any. She asked if they could be given to Erick Colace if we do

## 2012-09-18 NOTE — Telephone Encounter (Signed)
celebrex samples OK if we have. Give to Osval[facility] Human

## 2012-09-18 NOTE — Telephone Encounter (Signed)
Samples given to lue ann

## 2012-09-24 ENCOUNTER — Other Ambulatory Visit: Payer: Self-pay | Admitting: Nurse Practitioner

## 2012-09-28 ENCOUNTER — Other Ambulatory Visit: Payer: Self-pay | Admitting: *Deleted

## 2012-09-28 MED ORDER — ZOLPIDEM TARTRATE 10 MG PO TABS: 10.0000 mg | ORAL_TABLET | Freq: Every evening | ORAL | Status: DC | PRN

## 2012-09-28 NOTE — Telephone Encounter (Signed)
Last seen 03/13/12, last filled 10/13 per our chart. Have nurse call into [facility] if approved

## 2012-10-02 ENCOUNTER — Telehealth: Payer: Self-pay | Admitting: Nurse Practitioner

## 2012-10-02 NOTE — Telephone Encounter (Signed)
rx callled into [facility]

## 2012-10-03 ENCOUNTER — Other Ambulatory Visit: Payer: Self-pay | Admitting: *Deleted

## 2012-10-05 ENCOUNTER — Telehealth: Payer: Self-pay | Admitting: Nurse Practitioner

## 2012-10-05 NOTE — Telephone Encounter (Signed)
Ok for celebrex samples if we have

## 2012-10-05 NOTE — Telephone Encounter (Signed)
PLEASE ADVISE.

## 2012-10-09 ENCOUNTER — Telehealth: Payer: Self-pay | Admitting: Nurse Practitioner

## 2012-10-09 NOTE — Telephone Encounter (Signed)
Samples given.  

## 2012-10-10 ENCOUNTER — Other Ambulatory Visit: Payer: Self-pay | Admitting: Nurse Practitioner

## 2012-10-10 NOTE — Telephone Encounter (Signed)
LAST OV 9/13 

## 2012-10-17 ENCOUNTER — Telehealth: Payer: Self-pay | Admitting: Nurse Practitioner

## 2012-10-17 NOTE — Telephone Encounter (Signed)
Savings card sent to patient since we have no samples.

## 2012-10-25 ENCOUNTER — Other Ambulatory Visit: Payer: Self-pay | Admitting: Nurse Practitioner

## 2012-10-26 NOTE — Telephone Encounter (Signed)
LAST LABS 9/13 

## 2012-10-31 ENCOUNTER — Other Ambulatory Visit: Payer: Self-pay | Admitting: *Deleted

## 2012-10-31 MED ORDER — ZOLPIDEM TARTRATE 10 MG PO TABS: 10.0000 mg | ORAL_TABLET | Freq: Every evening | ORAL | Status: DC | PRN

## 2012-10-31 NOTE — Telephone Encounter (Signed)
LAT REFILL 10/02/12. CALL IN [facility] [place]

## 2012-10-31 NOTE — Telephone Encounter (Signed)
Called to [facility] 

## 2012-10-31 NOTE — Telephone Encounter (Signed)
Call in [facility] to pharmacy

## 2012-11-13 NOTE — Telephone Encounter (Signed)
This is Brenda Solis's mother in law and she got her samples she requested

## 2012-11-24 ENCOUNTER — Other Ambulatory Visit: Payer: Self-pay

## 2012-11-24 MED ORDER — ZOLPIDEM TARTRATE 10 MG PO TABS: 10.0000 mg | ORAL_TABLET | Freq: Every evening | ORAL | Status: DC | PRN

## 2012-11-24 NOTE — Telephone Encounter (Signed)
Please call in ambien rx 

## 2012-11-24 NOTE — Telephone Encounter (Signed)
Last seen 9/13   Last filled 10/31/12  Call in and have nurse notify patient

## 2012-11-25 NOTE — Telephone Encounter (Signed)
Called into [facility] 

## 2012-11-27 ENCOUNTER — Telehealth: Payer: Self-pay | Admitting: Nurse Practitioner

## 2012-11-27 NOTE — Telephone Encounter (Signed)
Samples given.  

## 2012-12-04 ENCOUNTER — Other Ambulatory Visit: Payer: Self-pay | Admitting: Nurse Practitioner

## 2012-12-05 ENCOUNTER — Other Ambulatory Visit: Payer: Self-pay | Admitting: Orthopedic Surgery

## 2012-12-05 DIAGNOSIS — M48061 Spinal stenosis, lumbar region without neurogenic claudication: Secondary | ICD-10-CM

## 2012-12-06 ENCOUNTER — Ambulatory Visit
Admission: RE | Admit: 2012-12-06 | Discharge: 2012-12-06 | Disposition: A | Discharge status: Home / Self Care | Payer: Medicare Other | Source: Ambulatory Visit | Attending: Orthopedic Surgery | Admitting: Orthopedic Surgery

## 2012-12-06 VITALS — BP 129/49 | HR 58

## 2012-12-06 DIAGNOSIS — M48061 Spinal stenosis, lumbar region without neurogenic claudication: Secondary | ICD-10-CM

## 2012-12-06 MED ORDER — IOHEXOL 180 MG/ML  SOLN
17.0000 mL | Freq: Once | INTRAMUSCULAR | Status: AC | PRN
  Administered 2012-12-06: 17 mL via INTRATHECAL

## 2012-12-06 MED ORDER — DIAZEPAM 5 MG PO TABS
5.0000 mg | ORAL_TABLET | Freq: Once | ORAL | Status: AC
  Administered 2012-12-06: 5 mg via ORAL

## 2012-12-06 NOTE — Telephone Encounter (Signed)
Last seen 03/13/12

## 2012-12-06 NOTE — Progress Notes (Signed)
Pt states she has been off celexa for the past 2 days. Discharge instructions explained.

## 2012-12-25 ENCOUNTER — Other Ambulatory Visit: Payer: Self-pay

## 2012-12-25 MED ORDER — ZOLPIDEM TARTRATE 10 MG PO TABS: 10.0000 mg | ORAL_TABLET | Freq: Every evening | ORAL | Status: DC | PRN

## 2012-12-25 NOTE — Telephone Encounter (Signed)
Last seen 9/13    MMM    If approved call in and have nurse notify patient

## 2012-12-25 NOTE — Telephone Encounter (Signed)
RX called to [facility]. 

## 2012-12-25 NOTE — Telephone Encounter (Signed)
Lease call in [facility] with 2 refill

## 2013-01-01 ENCOUNTER — Other Ambulatory Visit: Payer: Self-pay | Admitting: Nurse Practitioner

## 2013-01-01 ENCOUNTER — Telehealth: Payer: Self-pay | Admitting: Nurse Practitioner

## 2013-01-04 NOTE — Telephone Encounter (Signed)
Last seen 09/13

## 2013-01-09 ENCOUNTER — Other Ambulatory Visit: Payer: Self-pay | Admitting: Cardiology

## 2013-01-22 ENCOUNTER — Encounter (HOSPITAL_COMMUNITY): Payer: Self-pay | Admitting: Pharmacy Technician

## 2013-01-22 ENCOUNTER — Other Ambulatory Visit (HOSPITAL_COMMUNITY): Payer: Self-pay | Admitting: Orthopedic Surgery

## 2013-01-22 NOTE — Patient Instructions (Addendum)
20 Brenda Solis  01/22/2013   Your procedure is scheduled on: 02-01-2013  Report to [facility] at 530  AM.  Call this number if you have problems the morning of surgery [phone]   Remember:   Do not eat food or drink liquids :After Midnight.     Take these medicines the morning of surgery with A SIP OF WATER: citalopram, levothyroxine, metoprolol succinate                                SEE [facility] PREPARING FOR SURGERY SHEET   Do not wear jewelry, make-up or nail polish.  Do not wear lotions, powders, or perfumes. You may wear deodorant.   Men may shave face and neck.  Do not bring valuables to the hospital. [facility] IS NOT RESPONSIBLE FOR VALUEABLES.  Contacts, dentures or bridgework may not be worn into surgery.  Leave suitcase in the car. After surgery it may be brought to your room.  For patients admitted to the hospital, checkout time is 11:00 AM the day of discharge.   Patients discharged the day of surgery will not be allowed to drive home.  Name and phone number of your driver:  Special Instructions: N/A   Please read over the following fact sheets that you were given: MRSA Informatio, incentive spirometer fact sheet.  Call Cain Sieve RN pre op nurse if needed [phone]    FAILURE TO FOLLOW THESE INSTRUCTIONS MAY RESULT IN THE CANCELLATION OF YOUR SURGERY.  PATIENT SIGNATURE___________________________________________  NURSE SIGNATURE_____________________________________________

## 2013-01-23 ENCOUNTER — Encounter (HOSPITAL_COMMUNITY)
Admission: RE | Admit: 2013-01-23 | Discharge: 2013-01-23 | Disposition: A | Discharge status: Home / Self Care | Payer: Medicare Other | Source: Ambulatory Visit | Attending: Orthopedic Surgery | Admitting: Orthopedic Surgery

## 2013-01-23 ENCOUNTER — Ambulatory Visit (HOSPITAL_COMMUNITY)
Admission: RE | Admit: 2013-01-23 | Discharge: 2013-01-23 | Disposition: A | Discharge status: Home / Self Care | Payer: Medicare Other | Source: Ambulatory Visit | Attending: Surgical | Admitting: Surgical

## 2013-01-23 ENCOUNTER — Encounter (HOSPITAL_COMMUNITY): Payer: Self-pay

## 2013-01-23 DIAGNOSIS — I10 Essential (primary) hypertension: Secondary | ICD-10-CM | POA: Insufficient documentation

## 2013-01-23 DIAGNOSIS — M5137 Other intervertebral disc degeneration, lumbosacral region: Secondary | ICD-10-CM | POA: Insufficient documentation

## 2013-01-23 DIAGNOSIS — M51379 Other intervertebral disc degeneration, lumbosacral region without mention of lumbar back pain or lower extremity pain: Secondary | ICD-10-CM | POA: Insufficient documentation

## 2013-01-23 DIAGNOSIS — Z01812 Encounter for preprocedural laboratory examination: Secondary | ICD-10-CM | POA: Insufficient documentation

## 2013-01-23 DIAGNOSIS — Z01818 Encounter for other preprocedural examination: Secondary | ICD-10-CM | POA: Insufficient documentation

## 2013-01-23 DIAGNOSIS — Z01810 Encounter for preprocedural cardiovascular examination: Secondary | ICD-10-CM | POA: Insufficient documentation

## 2013-01-23 HISTORY — DX: Depression, unspecified: F32.A

## 2013-01-23 HISTORY — DX: Major depressive disorder, single episode, unspecified: F32.9

## 2013-01-23 LAB — COMPREHENSIVE METABOLIC PANEL
ALT: 18 U/L (ref 0–35)
AST: 24 U/L (ref 0–37)
Albumin: 4 g/dL (ref 3.5–5.2)
Alkaline Phosphatase: 49 U/L (ref 39–117)
BUN: 20 mg/dL (ref 6–23)
CO2: 27 mEq/L (ref 19–32)
Calcium: 10.2 mg/dL (ref 8.4–10.5)
Chloride: 104 mEq/L (ref 96–112)
Creatinine, Ser: 0.88 mg/dL (ref 0.50–1.10)
GFR calc Af Amer: 72 mL/min — ABNORMAL LOW (ref >90)
GFR calc non Af Amer: 62 mL/min — ABNORMAL LOW (ref >90)
Glucose, Bld: 98 mg/dL (ref 70–99)
Potassium: 4.8 mEq/L (ref 3.5–5.1)
Sodium: 139 mEq/L (ref 135–145)
Total Bilirubin: 0.5 mg/dL (ref 0.3–1.2)
Total Protein: 7 g/dL (ref 6.0–8.3)

## 2013-01-23 LAB — CBC
MCHC: 33.5 g/dL (ref 30.0–36.0)
Platelets: 172 10*3/uL (ref 150–400)
RDW: 13.7 % (ref 11.5–15.5)

## 2013-01-23 LAB — URINALYSIS, ROUTINE W REFLEX MICROSCOPIC
Bilirubin Urine: NEGATIVE
Glucose, UA: NEGATIVE mg/dL
Hgb urine dipstick: NEGATIVE
Ketones, ur: NEGATIVE mg/dL
Leukocytes, UA: NEGATIVE
Nitrite: NEGATIVE
Protein, ur: NEGATIVE mg/dL
Specific Gravity, Urine: 1.012 (ref 1.005–1.030)
Urobilinogen, UA: 0.2 mg/dL (ref 0.0–1.0)
pH: 6.5 (ref 5.0–8.0)

## 2013-01-23 LAB — APTT: aPTT: 28 seconds (ref 24–37)

## 2013-01-23 LAB — SURGICAL PCR SCREEN
MRSA, PCR: NEGATIVE
Staphylococcus aureus: NEGATIVE

## 2013-01-23 LAB — PROTIME-INR
INR: 0.91 (ref 0.00–1.49)
Prothrombin Time: 12.1 seconds (ref 11.6–15.2)

## 2013-01-23 NOTE — Progress Notes (Signed)
Chest xray results from 01-23-13 faxed by epic to dr Darrelyn Hillock

## 2013-01-25 ENCOUNTER — Other Ambulatory Visit: Payer: Self-pay | Admitting: Nurse Practitioner

## 2013-01-26 NOTE — Telephone Encounter (Signed)
ast lipids 03/03/12  MMM

## 2013-01-28 NOTE — H&P (Signed)
Brenda Solis is an 78 y.o. female.   Chief Complaint: back pain HPI: Brenda Solis is a 78 year old female with the chief complaint of right sided back pain. She has had this for several months without any known injury. She is experiencing pain radiate from the back into the right LE. No numbness or tingling. No dysfunction with bladder or bowels. CT myelogram shows a near complete block at L4-L5.  Past Medical History  Diagnosis Date  . Thyroid disease     hypothyroidism  . Insomnia, unspecified   . Spondylosis of unspecified site without mention of myelopathy   . Hypertension   . Hyperlipidemia   . Depression     Past Surgical History  Procedure Laterality Date  . Total hip arthroplasty  2011    RIGHT  . Cataract extraction, bilateral Bilateral 2006  . Knee surgery Right 2009    meniscus repair  . Cervical disc surgery  1999  . Cesarean section  1962 and 1967  . Cholecystectomy  1990    Family History  Problem Relation Age of Onset  . Stroke      FAMILY  . Esophageal varices      FAMILY  . Coronary artery disease Brother     44s   Social History:  reports that she has never smoked. She has never used smokeless tobacco. She reports that she does not drink alcohol or use illicit drugs.  Allergies:  Allergies  Allergen Reactions  . Oxycodone Itching     Current outpatient prescriptions: ALPRAZolam (XANAX) 0.25 MG tablet, Take 0.25 mg by mouth at bedtime as needed for sleep. , Disp: , Rfl: ;   atorvastatin (LIPITOR) 40 MG tablet, Take 40 mg by mouth at bedtime., Disp: , Rfl: ;   cholecalciferol (VITAMIN D) 1000 UNITS tablet, Take 2,000 Units by mouth daily.  , Disp: , Rfl: ;   citalopram (CELEXA) 20 MG tablet, Take 20 mg by mouth every morning., Disp: , Rfl:  Cyanocobalamin (VITAMIN B 12) 100 MCG LOZG, Take by mouth daily., Disp: , Rfl: ;   levothyroxine (SYNTHROID, LEVOTHROID) 25 MCG tablet, Take 25 mcg by mouth daily before breakfast. , Disp: , Rfl: ;   meclizine  (ANTIVERT) 25 MG tablet, Take 25 mg by mouth 3 (three) times daily as needed (for inner ear problems). , Disp: , Rfl: ;   metoprolol succinate (TOPROL-XL) 25 MG 24 hr tablet, Take 25 mg by mouth every morning., Disp: , Rfl:  traMADol-acetaminophen (ULTRACET) 37.5-325 MG per tablet, Take 1 tablet by mouth every 8 (eight) hours as needed for pain. , Disp: , Rfl: ;   vitamin C (ASCORBIC ACID) 500 MG tablet, Take 500 mg by mouth daily., Disp: , Rfl: ;   vitamin E 400 UNIT capsule, Take 400 Units by mouth daily., Disp: , Rfl: ;   zolpidem (AMBIEN) 10 MG tablet, Take 10 mg by mouth at bedtime., Disp: , Rfl:      Review of Systems  Constitutional: Negative.   HENT: Negative.  Negative for neck pain.   Eyes: Negative.   Respiratory: Negative.   Cardiovascular: Negative.   Gastrointestinal: Negative.   Genitourinary: Negative.   Musculoskeletal: Positive for back pain and joint pain. Negative for myalgias and falls.  Skin: Negative.   Neurological: Negative.   Endo/Heme/Allergies: Negative.   Psychiatric/Behavioral: Negative.     Vitals Weight: 185 lb Height: 68.5 in Body Surface Area: 2.01 m Body Mass Index: 27.72 kg/m Pulse: 73 (Regular) BP: 166/60 (Sitting,  Left Arm, Standard)  Physical Exam  Constitutional: She is oriented to person, place, and time. She appears well-developed and well-nourished. No distress.  HENT:  Head: Normocephalic and atraumatic.  Right Ear: External ear normal.  Left Ear: External ear normal.  Nose: Nose normal.  Mouth/Throat: Oropharynx is clear and moist.  Eyes: Conjunctivae and EOM are normal.  Neck: Normal range of motion. Neck supple.  Cardiovascular: Normal rate, regular rhythm, normal heart sounds and intact distal pulses.   No murmur heard. Respiratory: Effort normal and breath sounds normal. She has no wheezes. She exhibits no tenderness.  GI: Soft. Bowel sounds are normal. She exhibits no distension and no mass.  Musculoskeletal:        Right hip: Normal.       Left hip: Normal.       Right knee: She exhibits decreased range of motion and swelling. She exhibits no effusion and no erythema. Tenderness found. Medial joint line and lateral joint line tenderness noted.       Left knee: Normal.       Lumbar back: She exhibits decreased range of motion, tenderness, pain and spasm. She exhibits no edema.       Right lower leg: She exhibits no tenderness and no swelling.       Left lower leg: She exhibits no tenderness and no swelling.  Neurological: She is alert and oriented to person, place, and time. She has normal strength and normal reflexes. No sensory deficit.  Skin: No rash noted. She is not diaphoretic. No erythema.  Psychiatric: She has a normal mood and affect.     Assessment/Plan Lumbar spinal stenosis, L4-L5 She needs a central decompression lumbar laminectomy L4-L5. Dr. Darrelyn Hillock discussed the risks and benefits of the procedure with the patient.   Brenda Solis Brenda Solis 01/28/2013, 1:54 PM

## 2013-02-01 ENCOUNTER — Encounter (HOSPITAL_COMMUNITY)
Admission: RE | Disposition: A | Discharge status: Home Health | Payer: Self-pay | Source: Ambulatory Visit | Attending: Orthopedic Surgery

## 2013-02-01 ENCOUNTER — Inpatient Hospital Stay (HOSPITAL_COMMUNITY): Payer: Medicare Other

## 2013-02-01 ENCOUNTER — Encounter (HOSPITAL_COMMUNITY): Payer: Self-pay | Admitting: *Deleted

## 2013-02-01 ENCOUNTER — Inpatient Hospital Stay (HOSPITAL_COMMUNITY): Payer: Medicare Other | Admitting: Anesthesiology

## 2013-02-01 ENCOUNTER — Inpatient Hospital Stay (HOSPITAL_COMMUNITY)
Admission: RE | Admit: 2013-02-01 | Discharge: 2013-02-02 | DRG: 491 | Disposition: A | Discharge status: Home Health | Payer: Medicare Other | Source: Ambulatory Visit | Attending: Orthopedic Surgery | Admitting: Orthopedic Surgery

## 2013-02-01 ENCOUNTER — Encounter (HOSPITAL_COMMUNITY): Payer: Self-pay | Admitting: Anesthesiology

## 2013-02-01 DIAGNOSIS — M216X9 Other acquired deformities of unspecified foot: Secondary | ICD-10-CM | POA: Diagnosis present

## 2013-02-01 DIAGNOSIS — F3289 Other specified depressive episodes: Secondary | ICD-10-CM | POA: Diagnosis present

## 2013-02-01 DIAGNOSIS — E039 Hypothyroidism, unspecified: Secondary | ICD-10-CM | POA: Diagnosis present

## 2013-02-01 DIAGNOSIS — M48062 Spinal stenosis, lumbar region with neurogenic claudication: Principal | ICD-10-CM | POA: Diagnosis present

## 2013-02-01 DIAGNOSIS — I10 Essential (primary) hypertension: Secondary | ICD-10-CM | POA: Diagnosis present

## 2013-02-01 DIAGNOSIS — F329 Major depressive disorder, single episode, unspecified: Secondary | ICD-10-CM | POA: Diagnosis present

## 2013-02-01 DIAGNOSIS — E785 Hyperlipidemia, unspecified: Secondary | ICD-10-CM | POA: Diagnosis present

## 2013-02-01 HISTORY — PX: DECOMPRESSIVE LUMBAR LAMINECTOMY LEVEL 1: SHX5791

## 2013-02-01 SURGERY — DECOMPRESSIVE LUMBAR LAMINECTOMY LEVEL 1
Anesthesia: General | Site: Back | Wound class: Clean

## 2013-02-01 MED ORDER — GLYCOPYRROLATE 0.2 MG/ML IJ SOLN
INTRAMUSCULAR | Status: DC | PRN
  Administered 2013-02-01: 0.6 mg via INTRAVENOUS

## 2013-02-01 MED ORDER — ROCURONIUM BROMIDE 100 MG/10ML IV SOLN
INTRAVENOUS | Status: DC | PRN
  Administered 2013-02-01: 50 mg via INTRAVENOUS
  Administered 2013-02-01: 10 mg via INTRAVENOUS
  Administered 2013-02-01: 5 mg via INTRAVENOUS

## 2013-02-01 MED ORDER — POLYETHYLENE GLYCOL 3350 17 G PO PACK: 17.0000 g | PACK | Freq: Every day | ORAL | Status: DC | PRN

## 2013-02-01 MED ORDER — MECLIZINE HCL 25 MG PO TABS
25.0000 mg | ORAL_TABLET | Freq: Three times a day (TID) | ORAL | Status: DC | PRN
  Filled 2013-02-01: qty 1

## 2013-02-01 MED ORDER — NEOSTIGMINE METHYLSULFATE 1 MG/ML IJ SOLN
INTRAMUSCULAR | Status: DC | PRN
  Administered 2013-02-01: 4 mg via INTRAVENOUS

## 2013-02-01 MED ORDER — FENTANYL CITRATE 0.05 MG/ML IJ SOLN: 25.0000 ug | INTRAMUSCULAR | Status: DC | PRN

## 2013-02-01 MED ORDER — LEVOTHYROXINE SODIUM 25 MCG PO TABS
25.0000 ug | ORAL_TABLET | Freq: Every day | ORAL | Status: DC
  Administered 2013-02-02: 25 ug via ORAL
  Filled 2013-02-01 (×2): qty 1

## 2013-02-01 MED ORDER — HYDROMORPHONE HCL PF 1 MG/ML IJ SOLN
0.5000 mg | INTRAMUSCULAR | Status: DC | PRN
  Administered 2013-02-01 – 2013-02-02 (×4): 1 mg via INTRAVENOUS

## 2013-02-01 MED ORDER — PROPOFOL INFUSION 10 MG/ML OPTIME
INTRAVENOUS | Status: DC | PRN
  Administered 2013-02-01: 150 mL via INTRAVENOUS

## 2013-02-01 MED ORDER — ACETAMINOPHEN 650 MG RE SUPP: 650.0000 mg | RECTAL | Status: DC | PRN

## 2013-02-01 MED ORDER — BISACODYL 10 MG RE SUPP: 10.0000 mg | Freq: Every day | RECTAL | Status: DC | PRN

## 2013-02-01 MED ORDER — ONDANSETRON HCL 4 MG/2ML IJ SOLN: 4.0000 mg | INTRAMUSCULAR | Status: DC | PRN

## 2013-02-01 MED ORDER — CEFAZOLIN SODIUM 1-5 GM-% IV SOLN
1.0000 g | Freq: Three times a day (TID) | INTRAVENOUS | Status: AC
  Administered 2013-02-01 – 2013-02-02 (×3): 1 g via INTRAVENOUS
  Filled 2013-02-01 (×4): qty 50

## 2013-02-01 MED ORDER — LIDOCAINE HCL (CARDIAC) 20 MG/ML IV SOLN
INTRAVENOUS | Status: DC | PRN
  Administered 2013-02-01: 80 mg via INTRAVENOUS

## 2013-02-01 MED ORDER — HYDROMORPHONE HCL PF 1 MG/ML IJ SOLN
1.0000 mg | INTRAMUSCULAR | Status: DC | PRN
  Administered 2013-02-02: 1 mg via INTRAVENOUS
  Filled 2013-02-01 (×5): qty 1

## 2013-02-01 MED ORDER — MEPERIDINE HCL 50 MG/ML IJ SOLN: 6.2500 mg | INTRAMUSCULAR | Status: DC | PRN

## 2013-02-01 MED ORDER — PHENOL 1.4 % MT LIQD: 1.0000 | OROMUCOSAL | Status: DC | PRN

## 2013-02-01 MED ORDER — SODIUM CHLORIDE 0.9 % IR SOLN
Status: DC | PRN
  Administered 2013-02-01: 08:00:00

## 2013-02-01 MED ORDER — BUPIVACAINE-EPINEPHRINE PF 0.5-1:200000 % IJ SOLN
INTRAMUSCULAR | Status: DC | PRN
  Administered 2013-02-01: 30 mL

## 2013-02-01 MED ORDER — METHOCARBAMOL 100 MG/ML IJ SOLN
500.0000 mg | Freq: Four times a day (QID) | INTRAVENOUS | Status: DC | PRN
  Administered 2013-02-01: 500 mg via INTRAVENOUS
  Filled 2013-02-01: qty 5

## 2013-02-01 MED ORDER — ONDANSETRON HCL 4 MG/2ML IJ SOLN
INTRAMUSCULAR | Status: DC | PRN
  Administered 2013-02-01 (×2): 2 mg via INTRAVENOUS

## 2013-02-01 MED ORDER — FLEET ENEMA 7-19 GM/118ML RE ENEM: 1.0000 | ENEMA | Freq: Once | RECTAL | Status: AC | PRN

## 2013-02-01 MED ORDER — CEFAZOLIN SODIUM-DEXTROSE 2-3 GM-% IV SOLR
2.0000 g | INTRAVENOUS | Status: AC
  Administered 2013-02-01: 2 g via INTRAVENOUS

## 2013-02-01 MED ORDER — FENTANYL CITRATE 0.05 MG/ML IJ SOLN
25.0000 ug | INTRAMUSCULAR | Status: DC | PRN
Start: 2013-02-01 — End: 2013-02-01
  Administered 2013-02-01 (×4): 50 ug via INTRAVENOUS

## 2013-02-01 MED ORDER — ZOLPIDEM TARTRATE 10 MG PO TABS: 10.0000 mg | ORAL_TABLET | Freq: Every day | ORAL | Status: DC

## 2013-02-01 MED ORDER — METHOCARBAMOL 500 MG PO TABS
500.0000 mg | ORAL_TABLET | Freq: Four times a day (QID) | ORAL | Status: DC | PRN
  Administered 2013-02-01 – 2013-02-02 (×2): 500 mg via ORAL
  Filled 2013-02-01 (×2): qty 1

## 2013-02-01 MED ORDER — LACTATED RINGERS IV SOLN: INTRAVENOUS | Status: DC

## 2013-02-01 MED ORDER — FENTANYL CITRATE 0.05 MG/ML IJ SOLN
INTRAMUSCULAR | Status: DC | PRN
  Administered 2013-02-01 (×3): 50 ug via INTRAVENOUS

## 2013-02-01 MED ORDER — CITALOPRAM HYDROBROMIDE 20 MG PO TABS
20.0000 mg | ORAL_TABLET | Freq: Every morning | ORAL | Status: DC
  Administered 2013-02-02: 20 mg via ORAL
  Filled 2013-02-01: qty 1

## 2013-02-01 MED ORDER — EPHEDRINE SULFATE 50 MG/ML IJ SOLN
INTRAMUSCULAR | Status: DC | PRN
  Administered 2013-02-01: 15 mg via INTRAVENOUS
  Administered 2013-02-01: 10 mg via INTRAVENOUS
  Administered 2013-02-01: 5 mg via INTRAVENOUS
  Administered 2013-02-01: 15 mg via INTRAVENOUS

## 2013-02-01 MED ORDER — ACETAMINOPHEN 325 MG PO TABS
650.0000 mg | ORAL_TABLET | ORAL | Status: DC | PRN
  Administered 2013-02-01 – 2013-02-02 (×2): 650 mg via ORAL
  Filled 2013-02-01 (×2): qty 2

## 2013-02-01 MED ORDER — BUPIVACAINE LIPOSOME 1.3 % IJ SUSP
20.0000 mL | Freq: Once | INTRAMUSCULAR | Status: DC
  Filled 2013-02-01: qty 20

## 2013-02-01 MED ORDER — THROMBIN 5000 UNITS EX SOLR
CUTANEOUS | Status: DC | PRN
  Administered 2013-02-01: 10000 [IU] via TOPICAL

## 2013-02-01 MED ORDER — BUPIVACAINE LIPOSOME 1.3 % IJ SUSP
INTRAMUSCULAR | Status: DC | PRN
  Administered 2013-02-01: 20 mL

## 2013-02-01 MED ORDER — LACTATED RINGERS IV SOLN
INTRAVENOUS | Status: DC | PRN
  Administered 2013-02-01 (×2): via INTRAVENOUS

## 2013-02-01 MED ORDER — METOPROLOL SUCCINATE ER 25 MG PO TB24
25.0000 mg | ORAL_TABLET | Freq: Every morning | ORAL | Status: DC
  Administered 2013-02-02: 25 mg via ORAL
  Filled 2013-02-01: qty 1

## 2013-02-01 MED ORDER — ZOLPIDEM TARTRATE 5 MG PO TABS
5.0000 mg | ORAL_TABLET | Freq: Every day | ORAL | Status: DC
  Administered 2013-02-01: 5 mg via ORAL
  Filled 2013-02-01: qty 1

## 2013-02-01 MED ORDER — PROMETHAZINE HCL 25 MG/ML IJ SOLN: 6.2500 mg | INTRAMUSCULAR | Status: DC | PRN

## 2013-02-01 MED ORDER — ALPRAZOLAM 0.25 MG PO TABS: 0.2500 mg | ORAL_TABLET | Freq: Every evening | ORAL | Status: DC | PRN

## 2013-02-01 MED ORDER — MENTHOL 3 MG MT LOZG: 1.0000 | LOZENGE | OROMUCOSAL | Status: DC | PRN

## 2013-02-01 MED ORDER — BACITRACIN-NEOMYCIN-POLYMYXIN 400-5-5000 EX OINT
TOPICAL_OINTMENT | CUTANEOUS | Status: DC | PRN
  Administered 2013-02-01: 1 via TOPICAL

## 2013-02-01 MED ORDER — POTASSIUM CHLORIDE 2 MEQ/ML IV SOLN
INTRAVENOUS | Status: DC
  Administered 2013-02-01: 18:00:00 via INTRAVENOUS
  Filled 2013-02-01 (×10): qty 1000

## 2013-02-01 MED ORDER — ATORVASTATIN CALCIUM 40 MG PO TABS
40.0000 mg | ORAL_TABLET | Freq: Every day | ORAL | Status: DC
  Administered 2013-02-01: 40 mg via ORAL
  Filled 2013-02-01 (×2): qty 1

## 2013-02-01 SURGICAL SUPPLY — 45 items
APL SKNCLS STERI-STRIP NONHPOA (GAUZE/BANDAGES/DRESSINGS) ×1
BAG SPEC THK2 15X12 ZIP CLS (MISCELLANEOUS) ×1
BAG ZIPLOCK 12X15 (MISCELLANEOUS) ×2 IMPLANT
BENZOIN TINCTURE PRP APPL 2/3 (GAUZE/BANDAGES/DRESSINGS) ×2 IMPLANT
CLEANER TIP ELECTROSURG 2X2 (MISCELLANEOUS) ×2 IMPLANT
CLOTH BEACON ORANGE TIMEOUT ST (SAFETY) ×2 IMPLANT
CONT SPECI 4OZ STER CLIK (MISCELLANEOUS) ×2 IMPLANT
DRAIN PENROSE 18X1/4 LTX STRL (WOUND CARE) IMPLANT
DRAPE MICROSCOPE LEICA (MISCELLANEOUS) ×2 IMPLANT
DRAPE POUCH INSTRU U-SHP 10X18 (DRAPES) ×2 IMPLANT
DRAPE SURG 17X11 SM STRL (DRAPES) ×2 IMPLANT
DRSG ADAPTIC 3X8 NADH LF (GAUZE/BANDAGES/DRESSINGS) ×2 IMPLANT
DRSG EMULSION OIL 3X16 NADH (GAUZE/BANDAGES/DRESSINGS) ×1 IMPLANT
DRSG PAD ABDOMINAL 8X10 ST (GAUZE/BANDAGES/DRESSINGS) ×4 IMPLANT
DURAPREP 26ML APPLICATOR (WOUND CARE) ×2 IMPLANT
ELECT REM PT RETURN 9FT ADLT (ELECTROSURGICAL) ×2
ELECTRODE REM PT RTRN 9FT ADLT (ELECTROSURGICAL) ×1 IMPLANT
GLOVE BIOGEL PI IND STRL 8 (GLOVE) ×1 IMPLANT
GLOVE BIOGEL PI INDICATOR 8 (GLOVE) ×1
GLOVE ECLIPSE 8.0 STRL XLNG CF (GLOVE) ×4 IMPLANT
GOWN PREVENTION PLUS LG XLONG (DISPOSABLE) ×6 IMPLANT
GOWN STRL REIN XL XLG (GOWN DISPOSABLE) ×4 IMPLANT
KIT BASIN OR (CUSTOM PROCEDURE TRAY) ×2 IMPLANT
KIT POSITIONING SURG ANDREWS (MISCELLANEOUS) ×2 IMPLANT
MANIFOLD NEPTUNE II (INSTRUMENTS) ×2 IMPLANT
NDL SPNL 18GX3.5 QUINCKE PK (NEEDLE) ×2 IMPLANT
NEEDLE SPNL 18GX3.5 QUINCKE PK (NEEDLE) ×4 IMPLANT
NS IRRIG 1000ML POUR BTL (IV SOLUTION) ×2 IMPLANT
PATTIES SURGICAL .5 X.5 (GAUZE/BANDAGES/DRESSINGS) IMPLANT
PATTIES SURGICAL .75X.75 (GAUZE/BANDAGES/DRESSINGS) IMPLANT
PATTIES SURGICAL 1X1 (DISPOSABLE) IMPLANT
PIN SAFETY NICK PLATE  2 MED (MISCELLANEOUS)
PIN SAFETY NICK PLATE 2 MED (MISCELLANEOUS) IMPLANT
POSITIONER SURGICAL ARM (MISCELLANEOUS) ×2 IMPLANT
SPONGE LAP 4X18 X RAY DECT (DISPOSABLE) IMPLANT
SPONGE SURGIFOAM ABS GEL 100 (HEMOSTASIS) ×2 IMPLANT
STAPLER VISISTAT 35W (STAPLE) IMPLANT
SUT VIC AB 0 CT1 27 (SUTURE) ×2
SUT VIC AB 0 CT1 27XBRD ANTBC (SUTURE) ×1 IMPLANT
SUT VIC AB 1 CT1 27 (SUTURE) ×8
SUT VIC AB 1 CT1 27XBRD ANTBC (SUTURE) ×4 IMPLANT
TAPE CLOTH SURG 6X10 WHT LF (GAUZE/BANDAGES/DRESSINGS) ×1 IMPLANT
TOWEL OR 17X26 10 PK STRL BLUE (TOWEL DISPOSABLE) ×4 IMPLANT
TRAY LAMINECTOMY (CUSTOM PROCEDURE TRAY) ×2 IMPLANT
WATER STERILE IRR 1500ML POUR (IV SOLUTION) ×2 IMPLANT

## 2013-02-01 NOTE — Brief Op Note (Signed)
02/01/2013  9:31 AM  PATIENT:  Brenda Solis  78 y.o. female  PRE-OPERATIVE DIAGNOSIS:  LUMBAR SPINAL STENOSIS  POST-OPERATIVE DIAGNOSIS:  LUMBAR SPINAL STENOSIS and Foraminal Stenosis at Two levels,Bilaterally  PROCEDURE:  Procedure(s): CENTRAL DECOMPRESSIVE LUMBAR LAMINECTOMY LEVEL 1 L4-L5 (N/A),Complete Decompressive Lumbar Laminectomy at L-4-L-5 and Hemilaminectomy L-3-L-4 Bilaterally with Decompression of TWO Roots,Bilaterall ,L-4 and L-5  Nerve Roots.  SURGEON:  Surgeon(s) and Role:    * Jacki Cones, MD - Primary    * Javier Docker, MD - Assisting    ASSISTANTS: Jene Every MD  ANESTHESIA:   general  EBL:  Total I/O In: 1500 [I.V.:1500] Out: 300 [Urine:200; Blood:100]  BLOOD ADMINISTERED:none  DRAINS: none   LOCAL MEDICATIONS USED:  MARCAINE 30cc of 0.50% with Epinephrine.Also Exparel 20cc mixed with 10cc of Normal Saline.    SPECIMEN:  No Specimen  DISPOSITION OF SPECIMEN:  N/A  COUNTS:  YES  TOURNIQUET:  * No tourniquets in log *  DICTATION: .Other Dictation: Dictation Number [id number]  PLAN OF CARE: Admit to inpatient   PATIENT DISPOSITION:  PACU - hemodynamically stable.   Delay start of Pharmacological VTE agent (>24hrs) due to surgical blood loss or risk of bleeding: yes

## 2013-02-01 NOTE — Interval H&P Note (Signed)
History and Physical Interval Note:  02/01/2013 7:17 AM  Brenda Solis  has presented today for surgery, with the diagnosis of LUMBAR SPINAL STENOSIS  The various methods of treatment have been discussed with the patient and family. After consideration of risks, benefits and other options for treatment, the patient has consented to  Procedure(s): CENTRAL DECOMPRESSIVE LUMBAR LAMINECTOMY LEVEL 1 L4-L5 (N/A) as a surgical intervention .  The patient's history has been reviewed, patient examined, no change in status, stable for surgery.  I have reviewed the patient's chart and labs.  Questions were answered to the patient's satisfaction.     Remi Rester A

## 2013-02-01 NOTE — Transfer of Care (Signed)
Immediate Anesthesia Transfer of Care Note  Patient: Brenda Solis  Procedure(s) Performed: Procedure(s): CENTRAL DECOMPRESSIVE LUMBAR LAMINECTOMY LEVEL 1 L4-L5 (N/A)  Patient Location: PACU  Anesthesia Type:General  Level of Consciousness: awake, alert , oriented and patient cooperative  Airway & Oxygen Therapy: Patient Spontanous Breathing and Patient connected to face mask oxygen  Post-op Assessment: Report given to PACU RN and Post -op Vital signs reviewed and stable  Post vital signs: Reviewed and stable  Complications: No apparent anesthesia complications

## 2013-02-01 NOTE — Anesthesia Postprocedure Evaluation (Signed)
  Anesthesia Post-op Note  Patient: Brenda Solis  Procedure(s) Performed: Procedure(s) (LRB): CENTRAL DECOMPRESSIVE LUMBAR LAMINECTOMY LEVEL 1 L4-L5 (N/A)  Patient Location: PACU  Anesthesia Type: General  Level of Consciousness: awake and alert   Airway and Oxygen Therapy: Patient Spontanous Breathing  Post-op Pain: mild  Post-op Assessment: Post-op Vital signs reviewed, Patient's Cardiovascular Status Stable, Respiratory Function Stable, Patent Airway and No signs of Nausea or vomiting  Last Vitals:  Filed Vitals:   02/01/13 1142  BP: 119/52  Pulse: 78  Temp: 36.3 C  Resp: 16    Post-op Vital Signs: stable   Complications: No apparent anesthesia complications

## 2013-02-01 NOTE — Anesthesia Preprocedure Evaluation (Addendum)
Anesthesia Evaluation  Patient identified by MRN, date of birth, ID band Patient awake    Reviewed: Allergy & Precautions, H&P , NPO status , Patient's Chart, lab work & pertinent test results  Airway Mallampati: II TM Distance: >3 FB Neck ROM: Full    Dental no notable dental hx.    Pulmonary neg pulmonary ROS,  breath sounds clear to auscultation  Pulmonary exam normal       Cardiovascular hypertension, Pt. on medications Rhythm:Regular Rate:Normal     Neuro/Psych negative neurological ROS  negative psych ROS   GI/Hepatic negative GI ROS, Neg liver ROS,   Endo/Other  Hypothyroidism   Renal/GU negative Renal ROS  negative genitourinary   Musculoskeletal negative musculoskeletal ROS (+)   Abdominal   Peds negative pediatric ROS (+)  Hematology negative hematology ROS (+)   Anesthesia Other Findings   Reproductive/Obstetrics negative OB ROS                          Anesthesia Physical Anesthesia Plan  ASA: II  Anesthesia Plan: General   Post-op Pain Management:    Induction: Intravenous  Airway Management Planned: Oral ETT  Additional Equipment:   Intra-op Plan:   Post-operative Plan: Extubation in OR  Informed Consent: I have reviewed the patients History and Physical, chart, labs and discussed the procedure including the risks, benefits and alternatives for the proposed anesthesia with the patient or authorized representative who has indicated his/her understanding and acceptance.   Dental advisory given  Plan Discussed with: CRNA  Anesthesia Plan Comments:         Anesthesia Quick Evaluation

## 2013-02-02 ENCOUNTER — Encounter (HOSPITAL_COMMUNITY): Payer: Self-pay | Admitting: Orthopedic Surgery

## 2013-02-02 MED ORDER — METHOCARBAMOL 500 MG PO TABS: 500.0000 mg | ORAL_TABLET | Freq: Four times a day (QID) | ORAL | Status: DC

## 2013-02-02 MED ORDER — HYDROMORPHONE HCL 2 MG PO TABS: 2.0000 mg | ORAL_TABLET | ORAL | Status: DC | PRN

## 2013-02-02 NOTE — Care Management Note (Signed)
    Page 1 of 2   02/02/2013     12:50:13 PM   CARE MANAGEMENT NOTE 02/02/2013  Patient:  Brenda Solis, Brenda Solis   Account Number:  [id number]  Date Initiated:  02/02/2013  Documentation initiated by:  Colleen Can  Subjective/Objective Assessment:   DX Lumbar spinal stenosis; decompressive lumbar laminectomy L3-4. L4-5     Action/Plan:   CM spoke with patient. Plans are for patient to reurn to her home in [place] [facility] where adult children will be her caregivers. She will need RW. Requesting [facility] for [facility] needs   Anticipated DC Date:  02/02/2013   Anticipated DC Plan:  HOME W HOME HEALTH SERVICES      DC Planning Services  CM consult      [facility] Choice  HOME HEALTH  DURABLE MEDICAL EQUIPMENT   Choice offered to / List presented to:  C-1 Patient   DME arranged  Levan Hurst      DME agency  [facility]     HH arranged  HH-2 PT  HH-3 OT      [facility] agency  [facility]   Status of service:  Completed, signed off Medicare Important Message given?  NA - LOS <3 / Initial given by admissions (If response is "NO", the following Medicare IM given date fields will be blank) Date Medicare IM given:   Date Additional Medicare IM given:    Discharge Disposition:  HOME W HOME HEALTH SERVICES  Per UR Regulation:    If discussed at Long Length of Stay Meetings, dates discussed:    Comments:  02/02/2013 Colleen Can BSN RN CCM [phone] [facility] notified of request for services; advised that they can provide hh services upon pt's discharge. RW has been delivered to pt's room. Pt states she already has commode seat and shower chair.

## 2013-02-02 NOTE — Op Note (Signed)
NAMEMarland Kitchen  Brenda Solis, Brenda Solis NO.:  [id number]  MEDICAL RECORD NO.:  [id number]  LOCATION:  [id number]                         FACILITY:  [facility]  PHYSICIAN:  Georges Lynch. Idamae Coccia, M.D.DATE OF BIRTH:  March 15, 1935  DATE OF PROCEDURE:  02/01/2013 DATE OF DISCHARGE:                              OPERATIVE REPORT   SURGEON:  Georges Lynch. Darrelyn Hillock, M.D.  ASSISTANT:  Jene Every, M.D.  PREOPERATIVE DIAGNOSIS: 1. Severe spinal stenosis at L4-L5. 2. Partial footdrop on the right. 3. Foraminal stenosis of L4 and L5 nerve roots bilaterally.  POSTOPERATIVE DIAGNOSIS: 1. Severe spinal stenosis at L4-L5. 2. Partial footdrop on the right. 3. Foraminal stenosis of L4 and L5 nerve roots bilaterally.  OPERATION: 1. Complete decompressive lumbar laminectomy at L4-5, hemilaminectomy     of L3-4 bilaterally. 2. Foraminotomies for the L4 and the L5 root bilaterally.  PROCEDURE IN DETAIL:  Under general anesthesia, the patient on spinal frame, routine orthopedic prep and draping of the lower back was carried out.  She had 2 g of IV Ancef preop.  At this particular point, appropriate time-out was carried out first.  Following that, after sterile prep and draping, 2 needles were placed in the back for localization purposes.  X-ray was taken.  Following that, I injected a mixture of 30 mL of 0.5% of Marcaine and epinephrine into the soft tissue structures.  This was done for bleeding control.  It was also done to help postop pain.  An incision then was made in the usual fashion.  Bleeders were identified and cauterized.  I then identified the spinous processes and I stripped the muscle from spinous process and lamina bilaterally at L4- L5, and proximal and distal to that level.  Another x-ray was taken. Following that, the MAS retractor were inserted.  We then began our central decompressive lumbar laminectomy, I removed the spinous process of L4 and a part of L3.  At this time, we went down to  the lamina and continued to remove bone centrally.  We then draped the microscope and brought the microscope and started our laminectomy.  We did a complete central decompressive lumbar laminectomy at L4-5.  Note, the ligamentum flavum was extremely thick and she also had evidence of a previous cortisone injection.  The ligamentum flavum was adhered to the dura.  We took a great deal of time with a microscope to go ahead and dissect that free.  Once that was done, we took another x-ray.  The x-rays finally showed [place] the appropriate levels distally and proximally, and we continued our decompression proximally and did a hemilaminectomy in L3-4 above as well went out into the foramina of the L4 root and L5 root to make sure now that we were completely free.  We were able to easily pass a hockey-stick distally and proximally and out the foramina.  We continued our dissection until the dura was free in all directions.  We thoroughly irrigated out the area and then I loosely applied some thrombin-soaked Gelfoam.  I then closed the wound layers in usual fashion.  At the end of the procedure, I injected a mixture of 20 mL of Exparel with 10  mL of saline into the muscle and the subcu.  The wound finally was closed with staple and sterile Neosporin dressing was applied.  The patient left the operative room in satisfactory condition.          ______________________________ Georges Lynch Darrelyn Hillock, M.D.     RAG/MEDQ  D:  02/01/2013  T:  02/02/2013  Job:  [id number]

## 2013-02-02 NOTE — Evaluation (Signed)
Physical Therapy Evaluation Patient Details Name: Brenda Solis MRN: [id number] DOB: August 17, 1934 Today's Date: 02/02/2013 Time: 4098-1191 PT Time Calculation (min): 24 min  PT Assessment / Plan / Recommendation History of Present Illness  s/p decompression L4-5  Clinical Impression  Pt will benefit form HHPT post acute    PT Assessment  All further PT needs can be met in the next venue of care    Follow Up Recommendations  Home health PT;Supervision - Intermittent    Does the patient have the potential to tolerate intense rehabilitation      Barriers to Discharge        Equipment Recommendations  Rolling walker with 5" wheels    Recommendations for Other Services     Frequency      Precautions / Restrictions Precautions Precautions: Back   Pertinent Vitals/Pain Back pain "ok", premedicated       Mobility  Bed Mobility Bed Mobility: Rolling Left;Sit to Sidelying Left Rolling Left: 4: Min guard Sit to Sidelying Left: 4: Min assist;HOB flat Details for Bed Mobility Assistance: cuesf or log roll, back precautions Transfers Transfers: Sit to Stand;Stand to Sit Sit to Stand: 4: Min guard;From chair/3-in-1;From bed Stand to Sit: 4: Min guard;To chair/3-in-1 Details for Transfer Assistance: cues for safety, hand placement, back precautions Ambulation/Gait Ambulation/Gait Assistance: 4: Min guard Ambulation Distance (Feet): 140 Feet (x 2) Assistive device: Rolling walker Ambulation/Gait Assistance Details: cues for RW position; LOB x 2 with min/guard recovery Gait Pattern: Step-through pattern Stairs Assistance: 4: Min assist Stairs Assistance Details (indicate cue type and reason): cues for sequence Stair Management Technique: One rail Right (HHA) Number of Stairs: 4    Exercises     PT Diagnosis:    PT Problem List:   PT Treatment Interventions:       PT Goals(Current goals can be found in the care plan section)    Visit Information  Last PT Received On:  02/02/13 Assistance Needed: +1 History of Present Illness: s/p decompression L4-5       Prior Functioning  Home Living Family/patient expects to be discharged to:: Private residence Living Arrangements: Alone Available Help at Discharge: Family Type of Home: House Home Access: Stairs to enter Secretary/administrator of Steps: 2 Entrance Stairs-Rails: Right Home Layout: One level Home Equipment: Shower seat;Cane - single point;Bedside commode Additional Comments: daughter with her for approx 1 day Prior Function Level of Independence: Independent Communication Communication: No difficulties    Cognition  Cognition Arousal/Alertness: Awake/alert Behavior During Therapy: WFL for tasks assessed/performed Overall Cognitive Status: Within Functional Limits for tasks assessed    Extremity/Trunk Assessment Upper Extremity Assessment Upper Extremity Assessment: Defer to OT evaluation Lower Extremity Assessment Lower Extremity Assessment: Overall WFL for tasks assessed   Balance Static Standing Balance Static Standing - Balance Support: Bilateral upper extremity supported;During functional activity Static Standing - Level of Assistance: 5: Stand by assistance  End of Session PT - End of Session Activity Tolerance: Patient tolerated treatment well Patient left: Other (comment) (with OT)  GP     [facility] 02/02/2013, 11:02 AM

## 2013-02-02 NOTE — Discharge Summary (Signed)
Physician Discharge Summary  Patient ID: Brenda Solis MRN: [id number] DOB/AGE: [birth year] 78 y.o.  Admit date: 02/01/2013 Discharge date: 02/02/2013  Admission Diagnoses:Spinal Stenosis  Discharge Diagnoses: Spinal Stenosis Active Problems:   Spinal stenosis, lumbar region, with neurogenic claudication   Discharged Condition: good  Hospital Course: No Post-op Problems.  Consults:PT  Significant Diagnostic Studies: Xrays in OR  Treatments: PT  Discharge Exam: Blood pressure 107/62, pulse 75, temperature 98.4 F (36.9 C), temperature source Oral, resp. rate 18, height 5' 8.5" (1.74 m), weight 85.276 kg (188 lb), SpO2 95.00%. Extremities: Homans sign is negative, no sign of DVT  Disposition:   Discharge Orders   Future Orders Complete By Expires     Call MD / Call 911  As directed     Comments:      If you experience chest pain or shortness of breath, CALL 911 and be transported to the hospital emergency room.  If you develope a fever above 101 F, pus (white drainage) or increased drainage or redness at the wound, or calf pain, call your surgeon's office.    Discharge instructions  As directed     Comments:      Change your dressing daily. Shower only, no tub bath. Call if any temperatures greater than 101 or any wound complications: [phone] during the day and ask for Dr. Jeannetta Ellis nurse, Mackey Birchwood. Aspirin 325mg m two times a day for two weeks.    Driving restrictions  As directed     Comments:      No driving for one week.    Increase activity slowly as tolerated  As directed         Medication List    STOP taking these medications       traMADol-acetaminophen 37.5-325 MG per tablet  Commonly known as:  ULTRACET      TAKE these medications       ALPRAZolam 0.25 MG tablet  Commonly known as:  XANAX  Take 0.25 mg by mouth at bedtime as needed for sleep.     atorvastatin 40 MG tablet  Commonly known as:  LIPITOR  Take 40 mg by mouth at bedtime.     cholecalciferol 1000 UNITS tablet  Commonly known as:  VITAMIN D  Take 2,000 Units by mouth daily.     citalopram 20 MG tablet  Commonly known as:  CELEXA  Take 20 mg by mouth every morning.     HYDROmorphone 2 MG tablet  Commonly known as:  DILAUDID  Take 1 tablet (2 mg total) by mouth every 4 (four) hours as needed for pain.     levothyroxine 25 MCG tablet  Commonly known as:  SYNTHROID, LEVOTHROID  Take 25 mcg by mouth daily before breakfast.     meclizine 25 MG tablet  Commonly known as:  ANTIVERT  Take 25 mg by mouth 3 (three) times daily as needed (for inner ear problems).     methocarbamol 500 MG tablet  Commonly known as:  ROBAXIN  Take 1 tablet (500 mg total) by mouth 4 (four) times daily.     metoprolol succinate 25 MG 24 hr tablet  Commonly known as:  TOPROL-XL  Take 25 mg by mouth every morning.     Vitamin B 12 100 MCG Lozg  Take by mouth daily.     vitamin C 500 MG tablet  Commonly known as:  ASCORBIC ACID  Take 500 mg by mouth daily.     vitamin E 400 UNIT capsule  Take 400 Units by mouth daily.     zolpidem 10 MG tablet  Commonly known as:  AMBIEN  Take 10 mg by mouth at bedtime.         Signed: Jessia Kief A 02/02/2013, 7:05 AM

## 2013-02-02 NOTE — Progress Notes (Signed)
Subjective: 1 Day Post-Op Procedure(s) (LRB): CENTRAL DECOMPRESSIVE LUMBAR LAMINECTOMY LEVEL 1 L4-L5 (N/A) Patient reports pain as 2 on 0-10 scale Doing very well today. Will ambulate and then DC.    Objective: Vital signs in last 24 hours: Temp:  [97.3 F (36.3 C)-99.5 F (37.5 C)] 98.4 F (36.9 C) (08/08 0612) Pulse Rate:  [75-86] 75 (08/08 0612) Resp:  [13-25] 18 (08/08 0612) BP: (100-125)/(40-67) 107/62 mmHg (08/08 0612) SpO2:  [95 %-100 %] 95 % (08/08 0612) Weight:  [85.276 kg (188 lb)] 85.276 kg (188 lb) (08/07 1052)  Intake/Output from previous day: 08/07 0701 - 08/08 0700 In: 4165 [P.O.:480; I.V.:3580; IV Piggyback:105] Out: 2450 [Urine:2350; Blood:100] Intake/Output this shift: Total I/O In: 1200 [I.V.:1200] Out: 900 [Urine:900]  No results found for this basename: HGB,  in the last 72 hours No results found for this basename: WBC, RBC, HCT, PLT,  in the last 72 hours No results found for this basename: NA, K, CL, CO2, BUN, CREATININE, GLUCOSE, CALCIUM,  in the last 72 hours No results found for this basename: LABPT, INR,  in the last 72 hours  Dorsiflexion/Plantar flexion intact  Assessment/Plan: 1 Day Post-Op Procedure(s) (LRB): CENTRAL DECOMPRESSIVE LUMBAR LAMINECTOMY LEVEL 1 L4-L5 (N/A) Discharge home with home health  Brenda Solis A 02/02/2013, 6:53 AM

## 2013-02-02 NOTE — Progress Notes (Signed)
Utilization review completed.  

## 2013-02-02 NOTE — Progress Notes (Addendum)
[facility]  [facility] is providing the following services: rolling walker (patient declined commode)  If patient discharges after hours, please call [phone].   Brenda Solis [phone] 02/02/2013, 12:04 PM

## 2013-02-02 NOTE — Evaluation (Signed)
Occupational Therapy Evaluation Patient Details Name: Brenda Solis MRN: [id number] DOB: October 04, 1934 Today's Date: 02/02/2013 Time: 4098-1191 OT Time Calculation (min): 32 min  OT Assessment / Plan / Recommendation History of present illness s/p decompression L4-5   Clinical Impression   Pt needs cues for reinforcement of back precautions throughout session. Recommend HHOT to follow up. Back care handout in room and reviewed with pt and explained how precautions apply to ADL.    OT Assessment  All further OT needs can be met in the next venue of care    Follow Up Recommendations  Home health OT;Supervision/Assistance - 24 hour    Barriers to Discharge      Equipment Recommendations  None recommended by OT    Recommendations for Other Services    Frequency       Precautions / Restrictions Precautions Precautions: Back Restrictions Weight Bearing Restrictions: No   Pertinent Vitals/Pain 2/10 premedicated    ADL  Eating/Feeding: Simulated;Independent Where Assessed - Eating/Feeding: Chair Grooming: Simulated;Wash/dry hands;Set up Where Assessed - Grooming: Supported sitting Upper Body Bathing: Simulated;Chest;Right arm;Left arm;Abdomen;Set up;Supervision/safety Where Assessed - Upper Body Bathing: Unsupported sitting Lower Body Bathing: Simulated;Minimal assistance (for lower legs and feet without AE) Where Assessed - Lower Body Bathing: Supported sit to stand Upper Body Dressing: Simulated;Set up;Supervision/safety Where Assessed - Upper Body Dressing: Unsupported sitting Lower Body Dressing: Moderate assistance;Performed (pt not able to cross legs up. used reacher to don underwear. Some difficulty with sequencing and use of reacher. Will benefit from additional practice.) Where Assessed - Lower Body Dressing: Supported sit to stand Toilet Transfer: Performed;Min Psychologist, sport and exercise: Raised toilet seat with arms (or 3-in-1 over toilet) Toileting - Clothing  Manipulation and Hygiene: Simulated;Minimal assistance Where Assessed - Engineer, mining and Hygiene: Sit to stand from 3-in-1 or toilet Tub/Shower Transfer: Simulated;Min guard (side step) Equipment Used: Long-handled shoe horn;Long-handled sponge;Reacher;Rolling walker;Sock aid ADL Comments: Demonstrated all AE and pt has a Sports administrator and LHS. She states she doesnt wear socks and has shoes that slip on easily but has ties. Issued elastic shoe laces to pt and explained coverage for AE if needed later. Pt does have some difficulty with using reacher to don underwear especially to not reach down to pull up underwear. Explained she needs to continue to pull up underwear with reacher until it reaches her knees and then she can reach for it. She needs frequent verbal and demo cues for hand placement during transition movements. Pt states her daughter can be with her at d/c for ADL. Recommend HHOT to reinforce back precautions. Pt also able to reach periareas for hygiene but had to emphasize back precautions several times and have her practice several trials to accomplish task while adhering to precautions.   OT Diagnosis: Generalized weakness  OT Problem List: Decreased strength;Decreased knowledge of use of DME or AE;Decreased knowledge of precautions OT Treatment Interventions:     OT Goals(Current goals can be found in the care plan section)    Visit Information  Last OT Received On: 02/02/13 Assistance Needed: +1 History of Present Illness: s/p decompression L4-5       Prior Functioning     Home Living Family/patient expects to be discharged to:: Private residence Living Arrangements: Alone Available Help at Discharge: Family Type of Home: House Home Access: Stairs to enter Secretary/administrator of Steps: 2 Entrance Stairs-Rails: Right Home Layout: One level Home Equipment: Shower seat;Cane - single point;Bedside commode;Adaptive equipment Adaptive Equipment:  Reacher;Long-handled sponge Additional Comments: daughter with  her for approx 1 day Prior Function Level of Independence: Independent Communication Communication: No difficulties         Vision/Perception     Cognition  Cognition Arousal/Alertness: Awake/alert Behavior During Therapy: WFL for tasks assessed/performed Overall Cognitive Status: Within Functional Limits for tasks assessed    Extremity/Trunk Assessment Upper Extremity Assessment Upper Extremity Assessment: Overall WFL for tasks assessed Lower Extremity Assessment Lower Extremity Assessment: Overall WFL for tasks assessed     Mobility  Transfers Transfers: Sit to Stand;Stand to Sit Sit to Stand: 4: Min guard;With upper extremity assist;From chair/3-in-1 Stand to Sit: 4: Min guard;With upper extremity assist;To chair/3-in-1 Details for Transfer Assistance: cues for safety, hand placement, back precautions     Exercise     Balance Balance Balance Assessed: Yes Static Standing Balance Static Standing - Balance Support: No upper extremity supported Static Standing - Level of Assistance: 4: Min assist (min guard)   End of Session OT - End of Session Activity Tolerance: Patient tolerated treatment well Patient left: in chair;with call bell/phone within reach  GO     Lennox Laity [phone] 02/02/2013, 11:41 AM

## 2013-02-12 ENCOUNTER — Other Ambulatory Visit (HOSPITAL_COMMUNITY): Payer: Self-pay | Admitting: Orthopedic Surgery

## 2013-02-12 ENCOUNTER — Ambulatory Visit (HOSPITAL_COMMUNITY)
Admission: RE | Admit: 2013-02-12 | Discharge: 2013-02-12 | Disposition: A | Discharge status: Home / Self Care | Payer: Medicare Other | Source: Ambulatory Visit | Attending: Orthopedic Surgery | Admitting: Orthopedic Surgery

## 2013-02-12 DIAGNOSIS — R52 Pain, unspecified: Secondary | ICD-10-CM

## 2013-02-12 DIAGNOSIS — R609 Edema, unspecified: Secondary | ICD-10-CM

## 2013-02-12 DIAGNOSIS — M79609 Pain in unspecified limb: Secondary | ICD-10-CM | POA: Insufficient documentation

## 2013-02-24 ENCOUNTER — Other Ambulatory Visit: Payer: Self-pay | Admitting: Nurse Practitioner

## 2013-02-27 NOTE — Telephone Encounter (Signed)
Patient needs to be seen. Has exceeded time since last visit. Needs to bring all medications to next appointment.   

## 2013-02-27 NOTE — Telephone Encounter (Signed)
Last lipids 03/03/12 MMM  Told on last fill that she needed an appt.  No appt scheduled

## 2013-02-28 ENCOUNTER — Other Ambulatory Visit: Payer: Self-pay | Admitting: Nurse Practitioner

## 2013-03-02 NOTE — Telephone Encounter (Signed)
Lacks 10 days being 1 year since she was seen

## 2013-03-05 ENCOUNTER — Ambulatory Visit (INDEPENDENT_AMBULATORY_CARE_PROVIDER_SITE_OTHER): Payer: Medicare Other | Admitting: Nurse Practitioner

## 2013-03-05 ENCOUNTER — Encounter: Payer: Self-pay | Admitting: Nurse Practitioner

## 2013-03-05 VITALS — BP 143/64 | HR 75 | Temp 98.8°F | Ht 68.5 in | Wt 186.0 lb

## 2013-03-05 DIAGNOSIS — I10 Essential (primary) hypertension: Secondary | ICD-10-CM

## 2013-03-05 DIAGNOSIS — F3289 Other specified depressive episodes: Secondary | ICD-10-CM

## 2013-03-05 DIAGNOSIS — E785 Hyperlipidemia, unspecified: Secondary | ICD-10-CM

## 2013-03-05 DIAGNOSIS — G4700 Insomnia, unspecified: Secondary | ICD-10-CM

## 2013-03-05 DIAGNOSIS — F329 Major depressive disorder, single episode, unspecified: Secondary | ICD-10-CM

## 2013-03-05 DIAGNOSIS — F411 Generalized anxiety disorder: Secondary | ICD-10-CM

## 2013-03-05 DIAGNOSIS — K5900 Constipation, unspecified: Secondary | ICD-10-CM

## 2013-03-05 DIAGNOSIS — E039 Hypothyroidism, unspecified: Secondary | ICD-10-CM

## 2013-03-05 DIAGNOSIS — G589 Mononeuropathy, unspecified: Secondary | ICD-10-CM

## 2013-03-05 DIAGNOSIS — G629 Polyneuropathy, unspecified: Secondary | ICD-10-CM

## 2013-03-05 MED ORDER — GABAPENTIN 300 MG PO CAPS: 300.0000 mg | ORAL_CAPSULE | Freq: Three times a day (TID) | ORAL | Status: DC

## 2013-03-05 MED ORDER — CITALOPRAM HYDROBROMIDE 20 MG PO TABS: 20.0000 mg | ORAL_TABLET | Freq: Every morning | ORAL | Status: DC

## 2013-03-05 MED ORDER — ZOLPIDEM TARTRATE 10 MG PO TABS: 10.0000 mg | ORAL_TABLET | Freq: Every day | ORAL | Status: DC

## 2013-03-05 MED ORDER — LINACLOTIDE 145 MCG PO CAPS: 145.0000 ug | ORAL_CAPSULE | Freq: Every day | ORAL | Status: DC

## 2013-03-05 MED ORDER — ATORVASTATIN CALCIUM 40 MG PO TABS: 40.0000 mg | ORAL_TABLET | Freq: Every day | ORAL | Status: DC

## 2013-03-05 MED ORDER — LEVOTHYROXINE SODIUM 25 MCG PO TABS: 25.0000 ug | ORAL_TABLET | Freq: Every day | ORAL | Status: DC

## 2013-03-05 MED ORDER — METOPROLOL SUCCINATE ER 25 MG PO TB24: 25.0000 mg | ORAL_TABLET | Freq: Every morning | ORAL | Status: DC

## 2013-03-05 MED ORDER — ALPRAZOLAM 0.25 MG PO TABS: 0.2500 mg | ORAL_TABLET | Freq: Every evening | ORAL | Status: DC | PRN

## 2013-03-05 NOTE — Patient Instructions (Signed)

## 2013-03-05 NOTE — Progress Notes (Signed)
Subjective:    Patient ID: Brenda Solis, female    DOB: [birth year], 78 y.o.   MRN: [id number]  Thyroid Problem Presents for follow-up (Hypothyroidism) visit. Patient reports no anxiety, cold intolerance, hair loss, heat intolerance or palpitations. The symptoms have been stable. The treatment provided moderate relief. Her past medical history is significant for hyperlipidemia. There is no history of diabetes.  Hypertension This is a chronic problem. The current episode started more than 1 year ago. The problem has been gradually improving since onset. The problem is controlled. Pertinent negatives include no palpitations or shortness of breath. Risk factors for coronary artery disease include post-menopausal state, dyslipidemia and family history. Past treatments include beta blockers. The current treatment provides moderate improvement. There are no compliance problems.  Hypertensive end-organ damage includes a thyroid problem.  Hyperlipidemia This is a chronic problem. The current episode started more than 1 year ago. The problem is uncontrolled. Recent lipid tests were reviewed and are high. Exacerbating diseases include hypothyroidism. She has no history of diabetes. Factors aggravating her hyperlipidemia include beta blockers. Pertinent negatives include no leg pain, myalgias or shortness of breath. Current antihyperlipidemic treatment includes statins. The current treatment provides moderate improvement of lipids. Risk factors for coronary artery disease include dyslipidemia, family history, hypertension and post-menopausal.   Depression Pt on Celexa 20 mg daily- pt states this controls her depression very well. No complaints or concerns. Pt also has prn Xanax .25 mg -but states she rarely uses it.  Insomnia Pt taking 10 mg Ambien q HS- pt states working well. Denies any side effects  * Patient c/o constant constipation - has tried [place] and miralax with no results     Review of  Systems  Respiratory: Negative for shortness of breath.   Cardiovascular: Negative for palpitations.  Endocrine: Negative for cold intolerance and heat intolerance.  Musculoskeletal: Positive for back pain (Surgery on lower back one month ago). Negative for myalgias.  All other systems reviewed and are negative.       Objective:   Physical Exam  Vitals reviewed. Constitutional: She is oriented to person, place, and time. She appears well-developed and well-nourished.  HENT:  Head: Normocephalic.  Right Ear: External ear normal.  Left Ear: External ear normal.  Nose: Nose normal.  Mouth/Throat: Oropharynx is clear and moist.  Eyes: EOM are normal. Pupils are equal, round, and reactive to light.  Neck: Normal range of motion. Neck supple.  Cardiovascular: Normal rate and regular rhythm.   Pulmonary/Chest: Effort normal and breath sounds normal.  Abdominal: Soft. Bowel sounds are normal. She exhibits no distension. There is no tenderness.  Musculoskeletal: She exhibits no edema.  Decreased ROM of lower back due to pain-Pt just had lower back surgery one month ago- walking with cane   Neurological: She is alert and oriented to person, place, and time.  Skin: Skin is warm and dry.  Psychiatric: She has a normal mood and affect. Her behavior is normal. Judgment and thought content normal.      BP 143/64  Pulse 75  Temp(Src) 98.8 F (37.1 C) (Oral)  Ht 5' 8.5" (1.74 m)  Wt 186 lb (84.369 kg)  BMI 27.87 kg/m2     Assessment & Plan:   1. GAD (generalized anxiety disorder)   2. Insomnia   3. Hypertension   4. Hyperlipidemia   5. Neuropathy   6. Hypothyroidism   7. Depression   8. Constipation  Orders Placed This Encounter  Procedures  . CMP14+EGFR  .  NMR, lipoprofile  . Thyroid Panel With TSH   Meds ordered this encounter  Medications  . DISCONTD: gabapentin (NEURONTIN) 300 MG capsule    Sig: Take 300 mg by mouth 3 (three) times daily.  Marland Kitchen atorvastatin (LIPITOR)  40 MG tablet    Sig: Take 1 tablet (40 mg total) by mouth at bedtime.    Dispense:  30 tablet    Refill:  5    Order Specific Question:  Supervising Provider    Answer:  Ernestina Penna [[id number]]  . gabapentin (NEURONTIN) 300 MG capsule    Sig: Take 1 capsule (300 mg total) by mouth 3 (three) times daily.    Dispense:  90 capsule    Refill:  5    Order Specific Question:  Supervising Provider    Answer:  Ernestina Penna [[id number]]  . metoprolol succinate (TOPROL-XL) 25 MG 24 hr tablet    Sig: Take 1 tablet (25 mg total) by mouth every morning.    Dispense:  30 tablet    Refill:  5    Order Specific Question:  Supervising Provider    Answer:  Ernestina Penna [[id number]]  . levothyroxine (SYNTHROID, LEVOTHROID) 25 MCG tablet    Sig: Take 1 tablet (25 mcg total) by mouth daily before breakfast.    Dispense:  30 tablet    Refill:  5    Order Specific Question:  Supervising Provider    Answer:  Ernestina Penna [[id number]]  . citalopram (CELEXA) 20 MG tablet    Sig: Take 1 tablet (20 mg total) by mouth every morning.    Dispense:  30 tablet    Refill:  5    Order Specific Question:  Supervising Provider    Answer:  Ernestina Penna [[id number]]  . zolpidem (AMBIEN) 10 MG tablet    Sig: Take 1 tablet (10 mg total) by mouth at bedtime.    Dispense:  30 tablet    Refill:  1    Order Specific Question:  Supervising Provider    Answer:  Ernestina Penna [[id number]]  . ALPRAZolam (XANAX) 0.25 MG tablet    Sig: Take 1 tablet (0.25 mg total) by mouth at bedtime as needed for sleep.    Dispense:  30 tablet    Refill:  1    Order Specific Question:  Supervising Provider    Answer:  Christell Constant, DONALD W [[id number]]  -Linzess 145mg  1 po qd #30 0 refills   Continue all meds Labs pending Diet and exercise encouraged Follow up in 3 months Health maintenance reviewed  Mary-Margaret Daphine Deutscher, FNP

## 2013-03-06 LAB — CMP14+EGFR
AST: 24 IU/L (ref 0–40)
Albumin: 4.4 g/dL (ref 3.5–4.8)
Alkaline Phosphatase: 56 IU/L (ref 39–117)
BUN/Creatinine Ratio: 13 (ref 11–26)
BUN: 11 mg/dL (ref 8–27)
CO2: 25 mmol/L (ref 18–29)
Calcium: 9.9 mg/dL (ref 8.6–10.2)
Chloride: 103 mmol/L (ref 97–108)
Creatinine, Ser: 0.82 mg/dL (ref 0.57–1.00)
Sodium: 140 mmol/L (ref 134–144)

## 2013-03-06 LAB — NMR, LIPOPROFILE
HDL Cholesterol by NMR: 51 mg/dL (ref >=40)
LDL Particle Number: 1028 nmol/L — ABNORMAL HIGH (ref <1000)
LDLC SERPL CALC-MCNC: 68 mg/dL (ref <100)
LP-IR Score: 51 — ABNORMAL HIGH (ref <=45)

## 2013-03-06 LAB — THYROID PANEL WITH TSH
Free Thyroxine Index: 2 (ref 1.2–4.9)
T3 Uptake Ratio: 27 % (ref 24–39)
TSH: 2.87 u[IU]/mL (ref 0.450–4.500)

## 2013-03-08 ENCOUNTER — Telehealth: Payer: Self-pay | Admitting: Nurse Practitioner

## 2013-03-08 NOTE — Telephone Encounter (Signed)
Patient aware.

## 2013-03-08 NOTE — Telephone Encounter (Signed)
Nothing else comparable that isn't expensive- will have to continue mucinex daily- with occasional laxative as needed

## 2013-03-28 ENCOUNTER — Ambulatory Visit (INDEPENDENT_AMBULATORY_CARE_PROVIDER_SITE_OTHER): Payer: Medicare Other

## 2013-03-28 DIAGNOSIS — Z23 Encounter for immunization: Secondary | ICD-10-CM

## 2013-04-18 ENCOUNTER — Telehealth: Payer: Self-pay | Admitting: Nurse Practitioner

## 2013-04-19 NOTE — Telephone Encounter (Signed)
No samples patient aware. 

## 2013-06-01 ENCOUNTER — Other Ambulatory Visit: Payer: Self-pay | Admitting: *Deleted

## 2013-06-01 DIAGNOSIS — G4700 Insomnia, unspecified: Secondary | ICD-10-CM

## 2013-06-01 MED ORDER — ZOLPIDEM TARTRATE 10 MG PO TABS: 10.0000 mg | ORAL_TABLET | Freq: Every day | ORAL | Status: DC

## 2013-06-01 NOTE — Telephone Encounter (Signed)
Please call in ambien rx with 1 refill 

## 2013-06-01 NOTE — Telephone Encounter (Signed)
Last filled 05/02/13, last seen 03/05/13. Pt uses [facility]

## 2013-06-04 NOTE — Telephone Encounter (Signed)
rx called [facility]

## 2013-06-13 ENCOUNTER — Other Ambulatory Visit: Payer: Self-pay | Admitting: Nurse Practitioner

## 2013-06-13 MED ORDER — OSELTAMIVIR PHOSPHATE 75 MG PO CAPS: 75.0000 mg | ORAL_CAPSULE | Freq: Every day | ORAL | Status: DC

## 2013-06-16 ENCOUNTER — Other Ambulatory Visit: Payer: Self-pay | Admitting: General Practice

## 2013-06-16 ENCOUNTER — Ambulatory Visit (INDEPENDENT_AMBULATORY_CARE_PROVIDER_SITE_OTHER): Payer: Medicare Other | Admitting: General Practice

## 2013-06-16 VITALS — BP 150/65 | HR 67 | Temp 97.8°F | Wt 191.0 lb

## 2013-06-16 DIAGNOSIS — R059 Cough, unspecified: Secondary | ICD-10-CM

## 2013-06-16 DIAGNOSIS — J029 Acute pharyngitis, unspecified: Secondary | ICD-10-CM

## 2013-06-16 DIAGNOSIS — J069 Acute upper respiratory infection, unspecified: Secondary | ICD-10-CM

## 2013-06-16 DIAGNOSIS — R509 Fever, unspecified: Secondary | ICD-10-CM

## 2013-06-16 DIAGNOSIS — R05 Cough: Secondary | ICD-10-CM

## 2013-06-16 LAB — POCT RAPID STREP A (OFFICE): Rapid Strep A Screen: NEGATIVE

## 2013-06-16 MED ORDER — AZITHROMYCIN 250 MG PO TABS: ORAL_TABLET | ORAL | Status: DC

## 2013-06-16 MED ORDER — BENZONATATE 100 MG PO CAPS: 100.0000 mg | ORAL_CAPSULE | Freq: Two times a day (BID) | ORAL | Status: DC | PRN

## 2013-06-16 NOTE — Patient Instructions (Signed)

## 2013-06-16 NOTE — Progress Notes (Signed)
   Subjective:    Patient ID: WANNA GULLY, female    DOB: May 26, 1935, 78 y.o.   MRN: [id number]  Fever  This is a new problem. The current episode started yesterday. The problem occurs daily. The problem has been unchanged. The maximum temperature noted was 100 to 100.9 F. The temperature was taken using an oral thermometer. Associated symptoms include congestion, coughing and a sore throat. Pertinent negatives include no chest pain, muscle aches or wheezing. She has tried acetaminophen for the symptoms. The treatment provided significant relief.      Review of Systems  Constitutional: Positive for fever. Negative for chills.  HENT: Positive for congestion, postnasal drip and sore throat. Negative for sinus pressure.   Respiratory: Positive for cough. Negative for chest tightness, shortness of breath and wheezing.   Cardiovascular: Negative for chest pain and palpitations.  All other systems reviewed and are negative.       Objective:   Physical Exam  Constitutional: She is oriented to person, place, and time. She appears well-developed and well-nourished.  HENT:  Head: Normocephalic and atraumatic.  Right Ear: External ear normal.  Left Ear: External ear normal.  Mouth/Throat: Posterior oropharyngeal erythema present.  Cardiovascular: Normal rate, regular rhythm and normal heart sounds.   Pulmonary/Chest: Effort normal and breath sounds normal. No respiratory distress. She exhibits no tenderness.  Neurological: She is alert and oriented to person, place, and time.  Skin: Skin is warm and dry.  Psychiatric: She has a normal mood and affect.      Results for orders placed in visit on 06/16/13  POCT RAPID STREP A (OFFICE)      Result Value Range   Rapid Strep A Screen Negative  Negative  POCT INFLUENZA A/B      Result Value Range   Influenza A, POC Negative     Influenza B, POC           Assessment & Plan:  1. Sore throat  - POCT rapid strep A  2. Fever  - POCT  Influenza A/B  3. Upper respiratory infection  - azithromycin (ZITHROMAX) 250 MG tablet; Take as directed  Dispense: 6 tablet; Refill: 0  4. Cough  - benzonatate (TESSALON) 100 MG capsule; Take 1 capsule (100 mg total) by mouth 2 (two) times daily as needed for cough.  Dispense: 20 capsule; Refill: 0 -avoid irritants -Patient verbalized understanding Coralie Keens, FNP-C

## 2013-06-19 ENCOUNTER — Other Ambulatory Visit: Payer: Self-pay | Admitting: General Practice

## 2013-06-19 ENCOUNTER — Telehealth: Payer: Self-pay | Admitting: General Practice

## 2013-06-19 NOTE — Telephone Encounter (Signed)
Spoke with patient and she verbalized diarrhea has resolved. Will continue to take zpack and notify office if symptoms (sinus pressure) unresolved.

## 2013-07-02 ENCOUNTER — Other Ambulatory Visit: Payer: Self-pay | Admitting: General Practice

## 2013-07-02 ENCOUNTER — Other Ambulatory Visit: Payer: Self-pay

## 2013-07-02 DIAGNOSIS — F411 Generalized anxiety disorder: Secondary | ICD-10-CM

## 2013-07-02 NOTE — Telephone Encounter (Signed)
Last seen 06/16/13 Brenda Solis  If approved route to nurse to call into [place]

## 2013-07-03 MED ORDER — ALPRAZOLAM 0.25 MG PO TABS: 0.2500 mg | ORAL_TABLET | Freq: Every evening | ORAL | Status: DC | PRN

## 2013-07-03 NOTE — Telephone Encounter (Signed)
Please phone in

## 2013-07-04 NOTE — Telephone Encounter (Signed)
Called in.

## 2013-07-04 NOTE — Telephone Encounter (Signed)
PT HAS MEDICINE. °

## 2013-08-06 ENCOUNTER — Other Ambulatory Visit: Payer: Self-pay | Admitting: *Deleted

## 2013-08-06 DIAGNOSIS — G4700 Insomnia, unspecified: Secondary | ICD-10-CM

## 2013-08-06 MED ORDER — ZOLPIDEM TARTRATE 10 MG PO TABS: 10.0000 mg | ORAL_TABLET | Freq: Every day | ORAL | Status: DC

## 2013-08-06 NOTE — Telephone Encounter (Signed)
Last filled 07/05/13, completely out, pt uses [facility]

## 2013-08-06 NOTE — Telephone Encounter (Signed)
Please call in zolipidem with 1 refills

## 2013-08-06 NOTE — Telephone Encounter (Signed)
Called into [facility] 

## 2013-10-01 ENCOUNTER — Other Ambulatory Visit: Payer: Self-pay | Admitting: Nurse Practitioner

## 2013-10-02 NOTE — Telephone Encounter (Signed)
Left refill authorization on voicemail

## 2013-10-02 NOTE — Telephone Encounter (Signed)
Patient last seen in office on 06-16-13. Rx last filled on 09-03-13 for #30. Please advise. If approved please route to Pool B so nurse can phone in to pharmacy

## 2013-10-02 NOTE — Telephone Encounter (Signed)
Please call in ambien with 1 refills 

## 2013-10-09 ENCOUNTER — Other Ambulatory Visit: Payer: Self-pay | Admitting: Nurse Practitioner

## 2013-10-10 NOTE — Telephone Encounter (Signed)
Patient NTBS for follow up and lab work in order to get refill

## 2013-10-10 NOTE — Telephone Encounter (Signed)
Last lipids 9/14.

## 2013-10-17 ENCOUNTER — Telehealth: Payer: Self-pay | Admitting: Nurse Practitioner

## 2013-10-17 NOTE — Telephone Encounter (Signed)
appt given for 11 with [facility]

## 2013-10-18 ENCOUNTER — Other Ambulatory Visit: Payer: Self-pay | Admitting: Nurse Practitioner

## 2013-10-18 ENCOUNTER — Ambulatory Visit (HOSPITAL_COMMUNITY)
Admission: RE | Admit: 2013-10-18 | Discharge: 2013-10-18 | Disposition: A | Discharge status: Home / Self Care | Payer: Medicare Other | Source: Ambulatory Visit | Attending: Nurse Practitioner | Admitting: Nurse Practitioner

## 2013-10-18 ENCOUNTER — Ambulatory Visit (INDEPENDENT_AMBULATORY_CARE_PROVIDER_SITE_OTHER): Payer: Medicare Other | Admitting: Nurse Practitioner

## 2013-10-18 ENCOUNTER — Encounter: Payer: Self-pay | Admitting: Nurse Practitioner

## 2013-10-18 VITALS — BP 157/68 | HR 72 | Temp 97.6°F | Ht 69.6 in | Wt 185.6 lb

## 2013-10-18 DIAGNOSIS — M79662 Pain in left lower leg: Secondary | ICD-10-CM

## 2013-10-18 DIAGNOSIS — M79609 Pain in unspecified limb: Secondary | ICD-10-CM | POA: Insufficient documentation

## 2013-10-18 NOTE — Patient Instructions (Signed)
Deep Vein Thrombosis  A deep vein thrombosis (DVT) is a blood clot that develops in the deep, larger veins of the leg, arm, or pelvis. These are more dangerous than clots that might form in veins near the surface of the body. A DVT can lead to complications if the clot breaks off and travels in the bloodstream to the lungs.   A DVT can damage the valves in your leg veins, so that instead of flowing upward, the blood pools in the lower leg. This is called post-thrombotic syndrome, and it can result in pain, swelling, discoloration, and sores on the leg.  CAUSES  Usually, several things contribute to blood clots forming. Contributing factors include:   The flow of blood slows down.   The inside of the vein is damaged in some way.   You have a condition that makes blood clot more easily.  RISK FACTORS  Some people are more likely than others to develop blood clots. Risk factors include:    Older age, especially over 75 years of age.   Having a family history of blood clots or if you have already had a blot clot.   Having major or lengthy surgery. This is especially true for surgery on the hip, knee, or belly (abdomen). Hip surgery is particularly high risk.   Breaking a hip or leg.   Sitting or lying still for a long time. This includes long-distance travel, paralysis, or recovery from an illness or surgery.   Having cancer or cancer treatment.   Having a long, thin tube (catheter) placed inside a vein during a medical procedure.   Being overweight (obese).   Pregnancy and childbirth.   Hormone changes make the blood clot more easily during pregnancy.   The fetus puts pressure on the veins of the pelvis.   There is a risk of injury to veins during delivery or a caesarean. The risk is highest just after childbirth.   Medicines with the female hormone estrogen. This includes birth control pills and hormone replacement therapy.   Smoking.   Other circulation or heart problems.    SIGNS AND SYMPTOMS  When  a clot forms, it can either partially or totally block the blood flow in that vein. Symptoms of a DVT can include:   Swelling of the leg or arm, especially if one side is much worse.   Warmth and redness of the leg or arm, especially if one side is much worse.   Pain in an arm or leg. If the clot is in the leg, symptoms may be more noticeable or worse when standing or walking.  The symptoms of a DVT that has traveled to the lungs (pulmonary embolism, PE) usually start suddenly and include:   Shortness of breath.   Coughing.   Coughing up blood or blood-tinged phlegm.   Chest pain. The chest pain is often worse with deep breaths.   Rapid heartbeat.  Anyone with these symptoms should get emergency medical treatment right away. Call your local emergency services (911 in the [place] if you have these symptoms.  DIAGNOSIS  If a DVT is suspected, your health care provider will take a full medical history and perform a physical exam. Tests that also may be required include:   Blood tests, including studies of the clotting properties of the blood.   Ultrasonography to see if you have clots in your legs or lungs.   X-rays to show the flow of blood when dye is injected into the veins (  venography).   Studies of your lungs if you have any chest symptoms.  PREVENTION   Exercise the legs regularly. Take a brisk 30-minute walk every day.   Maintain a weight that is appropriate for your height.   Avoid sitting or lying in bed for long periods of time without moving your legs.   Women, particularly those over the age of 35 years, should consider the risks and benefits of taking estrogen medicines, including birth control pills.   Do not smoke, especially if you take estrogen medicines.   Long-distance travel can increase your risk of DVT. You should exercise your legs by walking or pumping the muscles every hour.   In-hospital prevention:   Many of the risk factors above relate to situations that exist with  hospitalization, either for illness, injury, or elective surgery.   Your health care provider will assess you for the need for venous thromboembolism prophylaxis when you are admitted to the hospital. If you are having surgery, your surgeon will assess you the day of or day after surgery.   Prevention may include medical and nonmedical measures.  TREATMENT  Once identified, a DVT can be treated. It can also be prevented in some circumstances. Once you have had a DVT, you may be at increased risk for a DVT in the future. The most common treatment for DVT is blood thinning (anticoagulant) medicine, which reduces the blood's tendency to clot. Anticoagulants can stop new blood clots from forming and stop old ones from growing. They cannot dissolve existing clots. Your body does this by itself over time. Anticoagulants can be given by mouth, by IV access, or by injection. Your health care provider will determine the best program for you. Other medicines or treatments that may be used are:   Heparin or related medicines (low molecular weight heparin) are usually the first treatment for a blood clot. They act quickly. However, they cannot be taken orally.   Heparin can cause a fall in a component of blood that stops bleeding and forms blood clots (platelets). You will be monitored with blood tests to be sure this does not occur.   Warfarin is an anticoagulant that can be swallowed. It takes a few days to start working, so usually heparin or related medicines are used in combination. Once warfarin is working, heparin is usually stopped.   Less commonly, clot dissolving drugs (thrombolytics) are used to dissolve a DVT. They carry a high risk of bleeding, so they are used mainly in severe cases, where your life or a limb is threatened.   Very rarely, a blood clot in the leg needs to be removed surgically.   If you are unable to take anticoagulants, your health care provider may arrange for you to have a filter placed  in a main vein in your abdomen. This filter prevents clots from traveling to your lungs.  HOME CARE INSTRUCTIONS   Take all medicines prescribed by your health care provider. Only take over-the-counter or prescription medicines for pain, fever, or discomfort as directed by your health care provider.   Warfarin. Most people will continue taking warfarin after hospital discharge. Your health care provider will advise you on the length of treatment (usually 3 6 months, sometimes lifelong).   Too much and too little warfarin are both dangerous. Too much warfarin increases the risk of bleeding. Too little warfarin continues to allow the risk for blood clots. While taking warfarin, you will need to have regular blood tests to measure your   blood clotting time. These blood tests usually include both the prothrombin time (PT) and international normalized ratio (INR) tests. The PT and INR results allow your health care provider to adjust your dose of warfarin. The dose can change for many reasons. It is critically important that you take warfarin exactly as prescribed, and that you have your PT and INR levels drawn exactly as directed.   Many foods, especially foods high in vitamin K, can interfere with warfarin and affect the PT and INR results. Foods high in vitamin K include spinach, kale, broccoli, cabbage, collard and turnip greens, brussel sprouts, peas, cauliflower, seaweed, and parsley as well as beef and pork liver, green tea, and soybean oil. You should eat a consistent amount of foods high in vitamin K. Avoid major changes in your diet, or notify your health care provider before changing your diet. Arrange a visit with a dietitian to answer your questions.   Many medicines can interfere with warfarin and affect the PT and INR results. You must tell your health care provider about any and all medicines you take. This includes all vitamins and supplements. Be especially cautious with aspirin and  anti-inflammatory medicines. Ask your health care provider before taking these. Do not take or discontinue any prescribed or over-the-counter medicine except on the advice of your health care provider or pharmacist.   Warfarin can have side effects, primarily excessive bruising or bleeding. You will need to hold pressure over cuts for longer than usual. Your health care provider or pharmacist will discuss other potential side effects.   Alcohol can change the body's ability to handle warfarin. It is best to avoid alcoholic drinks or consume only very small amounts while taking warfarin. Notify your health care provider if you change your alcohol intake.   Notify your dentist or other health care providers before procedures.   Activity. Ask your health care provider how soon you can go back to normal activities. It is important to stay active to prevent blood clots. If you are on anticoagulant medicine, avoid contact sports.   Exercise. It is very important to exercise. This is especially important while traveling, sitting, or standing for long periods of time. Exercise your legs by walking or by pumping the muscles frequently. Take frequent walks.   Compression stockings. These are tight elastic stockings that apply pressure to the lower legs. This pressure can help keep the blood in the legs from clotting. You may need to wear compression stockings at home to help prevent a DVT.   Do not smoke. If you smoke, quit. Ask your health care provider for help with quitting smoking.   Learn as much as you can about DVT. Knowing more about the condition should help you keep it from coming back.   Wear a medical alert bracelet or carry a medical alert card.  SEEK MEDICAL CARE IF:   You notice a rapid heartbeat.   You feel weaker or more tired than usual.   You feel faint.   You notice increased bruising.   You feel your symptoms are not getting better in the time expected.   You believe you are having side  effects of medicine.  SEEK IMMEDIATE MEDICAL CARE IF:   You have chest pain.   You have trouble breathing.   You have new or increased swelling or pain in one leg.   You cough up blood.   You notice blood in vomit, in a bowel movement, or in urine.  MAKE SURE   YOU:   Understand these instructions.   Will watch your condition.   Will get help right away if you are not doing well or get worse.  Document Released: 06/14/2005 Document Revised: 04/04/2013 Document Reviewed: 02/19/2013  [employer] Patient Information 2014 [employer]

## 2013-10-18 NOTE — Progress Notes (Signed)
   Subjective:    Patient ID: Brenda Solis, female    DOB: 1935-03-06, 79 y.o.   MRN: [id number]  HPI Patient in c/o left leg pain- mainly on th lateral side of leg- worse in mornings when she first gets up- a sharp pain. Seems to get better throughout the day. No calf swelling.    Review of Systems  Constitutional: Negative.   Respiratory: Negative.   Cardiovascular: Negative.   Gastrointestinal: Negative.   Genitourinary: Negative.   Psychiatric/Behavioral: Negative.   All other systems reviewed and are negative.      Objective:   Physical Exam  Constitutional: She is oriented to person, place, and time. She appears well-developed and well-nourished.  Cardiovascular: Normal rate, normal heart sounds and intact distal pulses.   Pulmonary/Chest: Effort normal and breath sounds normal.  Musculoskeletal:  Pain left lateral leg on palpation (+) homan sign  Neurological: She is alert and oriented to person, place, and time.  Skin: Skin is warm and dry.  Psychiatric: She has a normal mood and affect. Her behavior is normal. Judgment and thought content normal.    BP 157/68  Pulse 72  Temp(Src) 97.6 F (36.4 C) (Oral)  Ht 5' 9.6" (1.768 m)  Wt 185 lb 9.6 oz (84.188 kg)  BMI 26.93 kg/m2       Assessment & Plan:   1. Pain of left lower leg    Orders Placed This Encounter  Procedures  . US Venous Img Lower Unilateral Left    Standing Status: Future     Number of Occurrences:      Standing Expiration Date: 12/19/2014    Order Specific Question:  Reason for Exam (SYMPTOM  OR DIAGNOSIS REQUIRED)    Answer:  left leg pain    Order Specific Question:  Preferred imaging location?    Answer:  [facility]    R/O DVT Continue ASA daily Will call once get doppler results  Mary-Margaret Hassell Done, FNP

## 2013-11-13 ENCOUNTER — Encounter: Payer: Self-pay | Admitting: Physician Assistant

## 2013-11-13 ENCOUNTER — Ambulatory Visit (INDEPENDENT_AMBULATORY_CARE_PROVIDER_SITE_OTHER): Payer: Medicare Other | Admitting: Physician Assistant

## 2013-11-13 VITALS — BP 137/64 | HR 72 | Temp 98.0°F | Ht 69.0 in | Wt 185.2 lb

## 2013-11-13 DIAGNOSIS — M25569 Pain in unspecified knee: Secondary | ICD-10-CM

## 2013-11-13 MED ORDER — PREDNISONE 10 MG PO KIT: 1.0000 | PACK | ORAL | Status: DC

## 2013-11-13 NOTE — Progress Notes (Signed)
Subjective:     Patient ID: Brenda Solis, female   DOB: Oct 07, 1934, 79 y.o.   MRN: [id number]  HPI Pt here for recheck of lateral L leg pain Prev seen for same and had neg [place] of leg for DVT Hx of spinal stenosis with decompression  Review of Systems Pain to the lateral L leg Sx worse in the am + giving way of the leg in the am Denies trauma to the leg/back    Objective:   Physical Exam NAD Sitting comfortably + TTP to lateral quad all of the way to the ankle No post calf TTP Good pulses/sensory Strength good distal No muscle wasting seen    Assessment:     Leg pain    Plan:     Discussed with pt this seems more like a radicular sx Trial of Pred Pack- SE reviewed Hold Mobic while taking Pred If sx cont f/u with [facility]

## 2013-11-16 ENCOUNTER — Other Ambulatory Visit: Payer: Self-pay | Admitting: Nurse Practitioner

## 2013-11-19 NOTE — Telephone Encounter (Signed)
Last labs 02/2013 

## 2013-11-20 NOTE — Telephone Encounter (Signed)
Patient NTBS for follow up and lab work  

## 2013-11-29 ENCOUNTER — Encounter: Payer: Self-pay | Admitting: Cardiology

## 2013-11-30 ENCOUNTER — Other Ambulatory Visit: Payer: Self-pay | Admitting: Nurse Practitioner

## 2013-11-30 NOTE — Telephone Encounter (Signed)
Patient last seen in office on 11-13-13. Rx last filled on 11-02-13 for #30. Please advise on refill. If approved please route to Pool B so nurse can phone in to pharmacy

## 2013-12-03 NOTE — Telephone Encounter (Signed)
Please call in ambien with 1 refills 

## 2013-12-03 NOTE — Telephone Encounter (Signed)
Called in [place]

## 2013-12-12 ENCOUNTER — Encounter: Payer: Self-pay | Admitting: Physician Assistant

## 2013-12-12 ENCOUNTER — Ambulatory Visit (INDEPENDENT_AMBULATORY_CARE_PROVIDER_SITE_OTHER): Payer: Medicare Other | Admitting: Physician Assistant

## 2013-12-12 VITALS — BP 164/76 | HR 70 | Temp 99.2°F | Ht 69.0 in | Wt 181.4 lb

## 2013-12-12 DIAGNOSIS — R5381 Other malaise: Secondary | ICD-10-CM

## 2013-12-12 DIAGNOSIS — R5383 Other fatigue: Principal | ICD-10-CM

## 2013-12-12 LAB — POCT CBC
GRANULOCYTE PERCENT: 59.6 % (ref 37–80)
HCT, POC: 36.4 % — AB (ref 37.7–47.9)
HEMOGLOBIN: 11.7 g/dL — AB (ref 12.2–16.2)
Lymph, poc: 2.3 (ref 0.6–3.4)
MCH, POC: 29.8 pg (ref 27–31.2)
MCHC: 32.3 g/dL (ref 31.8–35.4)
MCV: 92.3 fL (ref 80–97)
MPV: 6.9 fL (ref 0–99.8)
POC GRANULOCYTE: 4 (ref 2–6.9)
POC LYMPH %: 34.5 % (ref 10–50)
Platelet Count, POC: 203 10*3/uL (ref 142–424)
RBC: 3.9 M/uL — AB (ref 4.04–5.48)
RDW, POC: 14.3 %
WBC: 6.7 10*3/uL (ref 4.6–10.2)

## 2013-12-12 NOTE — Progress Notes (Signed)
Subjective:     Patient ID: Brenda Solis, female   DOB: February 21, 1935, 79 y.o.   MRN: [id number]  HPI Pt with a 3 week hx of fatigue Sx started with a several day bout of diarrhea Now after several weeks she does not feel like she has the same energy  Review of Systems No fever/chills No further N/V/D + hx of anemia in the past    Objective:   Physical Exam Oral- membranes moist No cerv nodes Heart- RRR w/o M Lungs- CTA CBC- sl anemia    Assessment:     Anemia    Plan:     Fluids Watch caff/dairy intake Restart daily iron and Vitamin F/U in 1 month for repeat CBC

## 2013-12-12 NOTE — Patient Instructions (Signed)
Anemia, Nonspecific Anemia is a condition in which the concentration of red blood cells or hemoglobin in the blood is below normal. Hemoglobin is a substance in red blood cells that carries oxygen to the tissues of the body. Anemia results in not enough oxygen reaching these tissues.  CAUSES  Common causes of anemia include:   Excessive bleeding. Bleeding may be internal or external. This includes excessive bleeding from periods (in women) or from the intestine.   Poor nutrition.   Chronic kidney, thyroid, and liver disease.  Bone marrow disorders that decrease red blood cell production.  Cancer and treatments for cancer.  HIV, AIDS, and their treatments.  Spleen problems that increase red blood cell destruction.  Blood disorders.  Excess destruction of red blood cells due to infection, medicines, and autoimmune disorders. SIGNS AND SYMPTOMS   Minor weakness.   Dizziness.   Headache.  Palpitations.   Shortness of breath, especially with exercise.   Paleness.  Cold sensitivity.  Indigestion.  Nausea.  Difficulty sleeping.  Difficulty concentrating. Symptoms may occur suddenly or they may develop slowly.  DIAGNOSIS  Additional blood tests are often needed. These help your health care provider determine the best treatment. Your health care provider will check your stool for blood and look for other causes of blood loss.  TREATMENT  Treatment varies depending on the cause of the anemia. Treatment can include:   Supplements of iron, vitamin B12, or folic acid.   Hormone medicines.   A blood transfusion. This may be needed if blood loss is severe.   Hospitalization. This may be needed if there is significant continual blood loss.   Dietary changes.  Spleen removal. HOME CARE INSTRUCTIONS Keep all follow-up appointments. It often takes many weeks to correct anemia, and having your health care provider check on your condition and your response to  treatment is very important. SEEK IMMEDIATE MEDICAL CARE IF:   You develop extreme weakness, shortness of breath, or chest pain.   You become dizzy or have trouble concentrating.  You develop heavy vaginal bleeding.   You develop a rash.   You have bloody or black, tarry stools.   You faint.   You vomit up blood.   You vomit repeatedly.   You have abdominal pain.  You have a fever or persistent symptoms for more than 2-3 days.   You have a fever and your symptoms suddenly get worse.   You are dehydrated.  MAKE SURE YOU:  Understand these instructions.  Will watch your condition.  Will get help right away if you are not doing well or get worse. Document Released: 07/22/2004 Document Revised: 02/14/2013 Document Reviewed: 12/08/2012 ExitCare Patient Information 2015 [employer]. This information is not intended to replace advice given to you by your health care provider. Make sure you discuss any questions you have with your health care provider.  

## 2013-12-13 ENCOUNTER — Telehealth: Payer: Self-pay | Admitting: Physician Assistant

## 2013-12-13 NOTE — Telephone Encounter (Signed)
Spoke with patient. She was told to take an MVI with iron but she would like to start B12 injections.  Explained that unless she is b12 deficient this will not correct the problem. She would like to know specifically what is causing the anemia.  Can you add an anemia panel to the labs drawn yesterday?

## 2013-12-13 NOTE — Addendum Note (Signed)
Addended by: Selmer Dominion on: 12/13/2013 02:46 PM   Modules accepted: Orders

## 2013-12-14 LAB — ANEMIA PROFILE B
Ferritin: 115 ng/mL (ref 15–150)
Folate: 12 ng/mL (ref >3.0)
IRON: 40 ug/dL (ref 35–155)
Iron Saturation: 13 % — ABNORMAL LOW (ref 15–55)
TIBC: 309 ug/dL (ref 250–450)
UIBC: 269 ug/dL (ref 150–375)
Vitamin B-12: >1999 pg/mL — ABNORMAL HIGH (ref 211–946)

## 2013-12-18 ENCOUNTER — Telehealth: Payer: Self-pay | Admitting: Physician Assistant

## 2013-12-18 NOTE — Telephone Encounter (Signed)
Patient is not feeling any better she was wondering if you could call in RX for iron that would be stronger for her. She still feels very week

## 2013-12-19 NOTE — Telephone Encounter (Signed)
She will try taking OTC iron supplement with vitamin C to aid in absorption. Also advised her to increase fiber and water intake to combat constipation and that she may want to add a stool softener as well. Patient stated understanding and agreement to plan and will f/u as directed and PRN.

## 2013-12-31 ENCOUNTER — Other Ambulatory Visit: Payer: Self-pay | Admitting: Nurse Practitioner

## 2014-01-01 ENCOUNTER — Other Ambulatory Visit: Payer: Self-pay | Admitting: Nurse Practitioner

## 2014-01-03 ENCOUNTER — Other Ambulatory Visit: Payer: Self-pay

## 2014-01-03 NOTE — Telephone Encounter (Signed)
Please call in ambien with 1 refills 

## 2014-01-03 NOTE — Telephone Encounter (Signed)
Last seen 12/12/13  [facility]  IF approved route to nurse to call into [facility]

## 2014-01-03 NOTE — Telephone Encounter (Signed)
Called to [facility]. 

## 2014-01-03 NOTE — Telephone Encounter (Signed)
Already doen earlier today- in basket to be called in

## 2014-01-28 ENCOUNTER — Other Ambulatory Visit: Payer: Self-pay | Admitting: Cardiology

## 2014-01-29 NOTE — Telephone Encounter (Signed)
Yes, thank you.

## 2014-01-29 NOTE — Telephone Encounter (Signed)
Patient has not seen Dr Stanford Breed in over 3 years. Should this be deferred to pcp? Please advise. Thanks, MI

## 2014-02-01 ENCOUNTER — Encounter: Payer: Self-pay | Admitting: Nurse Practitioner

## 2014-02-01 ENCOUNTER — Ambulatory Visit: Payer: Medicare Other | Admitting: Nurse Practitioner

## 2014-02-01 ENCOUNTER — Ambulatory Visit (INDEPENDENT_AMBULATORY_CARE_PROVIDER_SITE_OTHER): Payer: Medicare Other | Admitting: Nurse Practitioner

## 2014-02-01 VITALS — BP 142/70 | HR 80 | Temp 97.9°F | Ht 69.0 in | Wt 183.0 lb

## 2014-02-01 DIAGNOSIS — I10 Essential (primary) hypertension: Secondary | ICD-10-CM

## 2014-02-01 DIAGNOSIS — G4700 Insomnia, unspecified: Secondary | ICD-10-CM

## 2014-02-01 DIAGNOSIS — E039 Hypothyroidism, unspecified: Secondary | ICD-10-CM

## 2014-02-01 DIAGNOSIS — R5381 Other malaise: Secondary | ICD-10-CM

## 2014-02-01 DIAGNOSIS — E785 Hyperlipidemia, unspecified: Secondary | ICD-10-CM | POA: Insufficient documentation

## 2014-02-01 DIAGNOSIS — Z13820 Encounter for screening for osteoporosis: Secondary | ICD-10-CM

## 2014-02-01 DIAGNOSIS — F3289 Other specified depressive episodes: Secondary | ICD-10-CM

## 2014-02-01 DIAGNOSIS — R5383 Other fatigue: Secondary | ICD-10-CM

## 2014-02-01 DIAGNOSIS — F32A Depression, unspecified: Secondary | ICD-10-CM

## 2014-02-01 DIAGNOSIS — F329 Major depressive disorder, single episode, unspecified: Secondary | ICD-10-CM | POA: Insufficient documentation

## 2014-02-01 MED ORDER — CITALOPRAM HYDROBROMIDE 40 MG PO TABS: 40.0000 mg | ORAL_TABLET | Freq: Every day | ORAL | Status: DC

## 2014-02-01 NOTE — Progress Notes (Signed)
Subjective:    Patient ID: Brenda Solis, female    DOB: [birth year], 79 y.o.   MRN: [id number]  Leg Pain   Thyroid Problem Presents for follow-up (Hypothyroidism) visit. Patient reports no anxiety, cold intolerance, hair loss, heat intolerance or palpitations. The symptoms have been stable. The treatment provided moderate relief. Her past medical history is significant for hyperlipidemia. There is no history of diabetes.  Hypertension This is a chronic problem. The current episode started more than 1 year ago. The problem has been gradually improving since onset. The problem is controlled. Pertinent negatives include no palpitations or shortness of breath. Risk factors for coronary artery disease include post-menopausal state, dyslipidemia and family history. Past treatments include beta blockers. The current treatment provides moderate improvement. There are no compliance problems.  Hypertensive end-organ damage includes a thyroid problem.  Hyperlipidemia This is a chronic problem. The current episode started more than 1 year ago. The problem is uncontrolled. Recent lipid tests were reviewed and are high. Exacerbating diseases include hypothyroidism. She has no history of diabetes. Factors aggravating her hyperlipidemia include beta blockers. Associated symptoms include leg pain. Pertinent negatives include no myalgias or shortness of breath. Current antihyperlipidemic treatment includes statins. The current treatment provides moderate improvement of lipids. Risk factors for coronary artery disease include dyslipidemia, family history, hypertension and post-menopausal.   Depression Pt on Celexa 20 mg daily-depression screen scored 11- not under control.. No complaints or concerns. Pt also has prn Xanax .25 mg -but states she rarely uses it.  Insomnia Pt taking 10 mg Ambien q HS- pt states working well. Denies any side effects  * c/o of having very little energy- says that she has to push herself  to do things * C/O lower leg pain- said that she stopped her statin and that made no change in her soreness- so she wnet back on statin.     Review of Systems  Constitutional: Negative.   Respiratory: Negative for shortness of breath.   Cardiovascular: Negative for palpitations.  Endocrine: Negative for cold intolerance and heat intolerance.  Musculoskeletal: Positive for back pain (Surgery on lower back one month ago). Negative for myalgias.  Psychiatric/Behavioral: Positive for dysphoric mood. Negative for suicidal ideas.  All other systems reviewed and are negative.      Objective:   Physical Exam  Vitals reviewed. Constitutional: She is oriented to person, place, and time. She appears well-developed and well-nourished.  HENT:  Head: Normocephalic.  Right Ear: External ear normal.  Left Ear: External ear normal.  Nose: Nose normal.  Mouth/Throat: Oropharynx is clear and moist.  Eyes: EOM are normal. Pupils are equal, round, and reactive to light.  Neck: Normal range of motion. Neck supple.  Cardiovascular: Normal rate and regular rhythm.   Pulmonary/Chest: Effort normal and breath sounds normal.  Abdominal: Soft. Bowel sounds are normal. She exhibits no distension. There is no tenderness.  Musculoskeletal: She exhibits no edema.     Neurological: She is alert and oriented to person, place, and time.  Skin: Skin is warm and dry.  Psychiatric: She has a normal mood and affect. Her behavior is normal. Judgment and thought content normal.      BP 142/70  Pulse 80  Temp(Src) 97.9 F (36.6 C) (Oral)  Ht 5' 9"  (1.753 m)  Wt 183 lb (83.008 kg)  BMI 27.01 kg/m2     Assessment & Plan:   1. Hypothyroidism, unspecified hypothyroidism type   2. Essential hypertension   3. Insomnia   4.  Screening for osteoporosis   5. Other malaise and fatigue   6. Hyperlipidemia with target LDL less than 100   7. Depression    Orders Placed This Encounter  Procedures  . DG Bone  Density    Standing Status: Future     Number of Occurrences:      Standing Expiration Date: 04/03/2015    Order Specific Question:  Reason for Exam (SYMPTOM  OR DIAGNOSIS REQUIRED)    Answer:  screening    Order Specific Question:  Preferred imaging location?    Answer:  Internal  . Anemia Profile B  . CMP14+EGFR  . NMR, lipoprofile  . Thyroid Panel With TSH  . Vit D  25 hydroxy (rtn osteoporosis monitoring)   Meds ordered this encounter  Medications  . IRON, FERROUS GLUCONATE, PO    Sig: Take 65 mg by mouth.  . citalopram (CELEXA) 40 MG tablet    Sig: Take 1 tablet (40 mg total) by mouth daily.    Dispense:  30 tablet    Refill:  5    Order Specific Question:  Supervising Provider    Answer:  Chipper Herb [[id number]]  increased celexa to 40 mg daily hemoccult cards given to patient with directions Labs pending- once labs are back we may hold statin for a month to see if helps with leg pain Health maintenance reviewed Diet and exercise encouraged Continue all meds Follow up  In 3 months   [place], FNP

## 2014-02-01 NOTE — Patient Instructions (Signed)

## 2014-02-02 LAB — CMP14+EGFR
A/G RATIO: 1.7 (ref 1.1–2.5)
ALK PHOS: 50 IU/L (ref 39–117)
ALT: 15 IU/L (ref 0–32)
AST: 24 IU/L (ref 0–40)
Albumin: 4 g/dL (ref 3.5–4.8)
BUN/Creatinine Ratio: 17 (ref 11–26)
BUN: 18 mg/dL (ref 8–27)
CO2: 22 mmol/L (ref 18–29)
Calcium: 9.9 mg/dL (ref 8.7–10.3)
Chloride: 101 mmol/L (ref 97–108)
Creatinine, Ser: 1.05 mg/dL — ABNORMAL HIGH (ref 0.57–1.00)
GFR calc non Af Amer: 51 mL/min/{1.73_m2} — ABNORMAL LOW (ref >59)
GFR, EST AFRICAN AMERICAN: 59 mL/min/{1.73_m2} — AB (ref >59)
GLOBULIN, TOTAL: 2.4 g/dL (ref 1.5–4.5)
Glucose: 88 mg/dL (ref 65–99)
POTASSIUM: 4.3 mmol/L (ref 3.5–5.2)
Sodium: 139 mmol/L (ref 134–144)
Total Bilirubin: 0.3 mg/dL (ref 0.0–1.2)
Total Protein: 6.4 g/dL (ref 6.0–8.5)

## 2014-02-02 LAB — ANEMIA PROFILE B
BASOS ABS: 0 10*3/uL (ref 0.0–0.2)
Basos: 0 %
Eos: 2 %
Eosinophils Absolute: 0.2 10*3/uL (ref 0.0–0.4)
FERRITIN: 79 ng/mL (ref 15–150)
FOLATE: 18 ng/mL (ref >3.0)
HCT: 38.4 % (ref 34.0–46.6)
Hemoglobin: 13 g/dL (ref 11.1–15.9)
IMMATURE GRANS (ABS): 0 10*3/uL (ref 0.0–0.1)
IMMATURE GRANULOCYTES: 0 %
IRON SATURATION: 20 % (ref 15–55)
Iron: 59 ug/dL (ref 35–155)
LYMPHS ABS: 2.4 10*3/uL (ref 0.7–3.1)
LYMPHS: 34 %
MCH: 30.8 pg (ref 26.6–33.0)
MCHC: 33.9 g/dL (ref 31.5–35.7)
MCV: 91 fL (ref 79–97)
Monocytes Absolute: 0.7 10*3/uL (ref 0.1–0.9)
Monocytes: 10 %
Neutrophils Absolute: 3.8 10*3/uL (ref 1.4–7.0)
Neutrophils Relative %: 54 %
PLATELETS: 164 10*3/uL (ref 150–379)
RBC: 4.22 x10E6/uL (ref 3.77–5.28)
RDW: 13.9 % (ref 12.3–15.4)
RETIC CT PCT: 1.1 % (ref 0.6–2.6)
TIBC: 292 ug/dL (ref 250–450)
UIBC: 233 ug/dL (ref 150–375)
Vitamin B-12: >1999 pg/mL — ABNORMAL HIGH (ref 211–946)
WBC: 7.1 10*3/uL (ref 3.4–10.8)

## 2014-02-02 LAB — NMR, LIPOPROFILE
Cholesterol: 138 mg/dL (ref 100–199)
HDL CHOLESTEROL BY NMR: 43 mg/dL (ref >39)
HDL PARTICLE NUMBER: 33.7 umol/L (ref >=30.5)
LDL Particle Number: 884 nmol/L (ref <1000)
LDL Size: 20.8 nm (ref >20.5)
LDLC SERPL CALC-MCNC: 73 mg/dL (ref 0–99)
LP-IR SCORE: 53 — AB (ref <=45)
SMALL LDL PARTICLE NUMBER: 333 nmol/L (ref <=527)
Triglycerides by NMR: 109 mg/dL (ref 0–149)

## 2014-02-02 LAB — THYROID PANEL WITH TSH
Free Thyroxine Index: 1.8 (ref 1.2–4.9)
T3 Uptake Ratio: 28 % (ref 24–39)
T4, Total: 6.6 ug/dL (ref 4.5–12.0)
TSH: 4.34 u[IU]/mL (ref 0.450–4.500)

## 2014-02-02 LAB — VITAMIN D 25 HYDROXY (VIT D DEFICIENCY, FRACTURES): VIT D 25 HYDROXY: 34.6 ng/mL (ref 30.0–100.0)

## 2014-02-05 ENCOUNTER — Other Ambulatory Visit: Payer: Medicare Other

## 2014-02-05 DIAGNOSIS — Z1212 Encounter for screening for malignant neoplasm of rectum: Secondary | ICD-10-CM

## 2014-02-06 ENCOUNTER — Other Ambulatory Visit: Payer: Self-pay | Admitting: *Deleted

## 2014-02-06 LAB — FECAL OCCULT BLOOD, IMMUNOCHEMICAL: Fecal Occult Bld: NEGATIVE

## 2014-02-06 MED ORDER — LEVOTHYROXINE SODIUM 25 MCG PO TABS: 25.0000 ug | ORAL_TABLET | Freq: Every day | ORAL | Status: DC

## 2014-02-28 ENCOUNTER — Other Ambulatory Visit: Payer: Self-pay

## 2014-02-28 MED ORDER — ZOLPIDEM TARTRATE 10 MG PO TABS: ORAL_TABLET | ORAL | Status: DC

## 2014-02-28 NOTE — Telephone Encounter (Signed)
Please call in ambien  with 0 refills 

## 2014-02-28 NOTE — Telephone Encounter (Signed)
Left authorization on voicemail 

## 2014-02-28 NOTE — Telephone Encounter (Signed)
Last seen 02/01/14  MMM If approved route to nurse to call into [facility]

## 2014-03-05 ENCOUNTER — Encounter: Payer: Self-pay | Admitting: Family Medicine

## 2014-03-05 ENCOUNTER — Ambulatory Visit (INDEPENDENT_AMBULATORY_CARE_PROVIDER_SITE_OTHER): Payer: Medicare Other

## 2014-03-05 ENCOUNTER — Ambulatory Visit (INDEPENDENT_AMBULATORY_CARE_PROVIDER_SITE_OTHER): Payer: Medicare Other | Admitting: Family Medicine

## 2014-03-05 ENCOUNTER — Telehealth: Payer: Self-pay | Admitting: Nurse Practitioner

## 2014-03-05 VITALS — BP 130/61 | HR 70 | Temp 98.5°F | Ht 69.0 in | Wt 180.0 lb

## 2014-03-05 DIAGNOSIS — M542 Cervicalgia: Secondary | ICD-10-CM

## 2014-03-05 MED ORDER — HYDROCODONE-ACETAMINOPHEN 5-325 MG PO TABS: 1.0000 | ORAL_TABLET | Freq: Four times a day (QID) | ORAL | Status: DC | PRN

## 2014-03-05 MED ORDER — KETOROLAC TROMETHAMINE 30 MG/ML IJ SOLN
30.0000 mg | Freq: Once | INTRAMUSCULAR | Status: AC
  Administered 2014-03-05: 30 mg via INTRAMUSCULAR

## 2014-03-05 MED ORDER — METHYLPREDNISOLONE (PAK) 4 MG PO TABS: ORAL_TABLET | ORAL | Status: DC

## 2014-03-05 NOTE — Progress Notes (Signed)
   Subjective:    Patient ID: Brenda Solis, female    DOB: 11-22-1934, 79 y.o.   MRN: [id number]  HPI This 79 y.o. female presents for evaluation of right neck pain and discomfort.  She has hx of ddd of the cervical spine. She denies radicular sx's.   Review of Systems C/o neck discomfort No chest pain, SOB, HA, dizziness, vision change, N/V, diarrhea, constipation, dysuria, urinary urgency or frequency or rash.     Objective:   Physical Exam  Vital signs noted  Well developed well nourished female.  HEENT - Head atraumatic Normocephalic Respiratory - Lungs CTA bilateral Cardiac - RRR S1 and S2 without murmur GI - Abdomen soft Nontender and bowel sounds active x 4 MS - TTP left trapezius muscle  Cervical spine xray - DDD Prelimnary reading by Iverson Alamin    Assessment & Plan:  Neck pain - Plan: DG Cervical Spine Complete, methylPREDNIsolone (MEDROL DOSPACK) 4 MG tablet, HYDROcodone-acetaminophen (NORCO) 5-325 MG per tablet, ketorolac (TORADOL) 30 MG/ML injection 30 mg  Lysbeth Penner FNP

## 2014-03-05 NOTE — Telephone Encounter (Signed)
appt given for today at 2:45 with [facility]

## 2014-03-12 ENCOUNTER — Other Ambulatory Visit: Payer: Self-pay | Admitting: General Practice

## 2014-03-13 ENCOUNTER — Other Ambulatory Visit: Payer: Self-pay

## 2014-03-13 MED ORDER — METOPROLOL SUCCINATE ER 25 MG PO TB24
25.0000 mg | ORAL_TABLET | Freq: Every morning | ORAL | Status: DC
Start: 2014-03-13 — End: 2014-12-24

## 2014-03-13 NOTE — Telephone Encounter (Signed)
Please call in xanax with 1 refills 

## 2014-03-13 NOTE — Telephone Encounter (Signed)
Rf called to [facility]

## 2014-03-13 NOTE — Telephone Encounter (Signed)
Last seen 02/01/14, last filled 07/04/13. Call into [facility]

## 2014-03-14 ENCOUNTER — Encounter: Payer: Self-pay | Admitting: *Deleted

## 2014-03-23 ENCOUNTER — Other Ambulatory Visit: Payer: Self-pay | Admitting: Family Medicine

## 2014-03-23 ENCOUNTER — Telehealth: Payer: Self-pay | Admitting: Family Medicine

## 2014-03-23 MED ORDER — AZITHROMYCIN 250 MG PO TABS: ORAL_TABLET | ORAL | Status: DC

## 2014-03-23 NOTE — Telephone Encounter (Signed)
zpak sent to pharm

## 2014-03-23 NOTE — Telephone Encounter (Signed)
Patient aware rx sent to pharmacy.  

## 2014-04-22 ENCOUNTER — Telehealth: Payer: Self-pay | Admitting: Nurse Practitioner

## 2014-04-22 NOTE — Telephone Encounter (Signed)
Patient wants to try ketoprofen to see if it works better than her tramadol.  She has a friend that says it works great for there pain.

## 2014-04-23 MED ORDER — KETOPROFEN 50 MG PO CAPS: 50.0000 mg | ORAL_CAPSULE | Freq: Three times a day (TID) | ORAL | Status: DC | PRN

## 2014-04-23 NOTE — Telephone Encounter (Signed)
rx ready for pick up- can give rx to Brenda Solis if okay with patient

## 2014-04-23 NOTE — Telephone Encounter (Signed)
Patient aware.

## 2014-04-24 ENCOUNTER — Encounter: Payer: Self-pay | Admitting: Pharmacist

## 2014-04-24 ENCOUNTER — Ambulatory Visit (INDEPENDENT_AMBULATORY_CARE_PROVIDER_SITE_OTHER): Payer: Medicare Other

## 2014-04-24 ENCOUNTER — Ambulatory Visit (INDEPENDENT_AMBULATORY_CARE_PROVIDER_SITE_OTHER): Payer: Medicare Other | Admitting: Pharmacist

## 2014-04-24 VITALS — Ht 68.0 in | Wt 178.0 lb

## 2014-04-24 DIAGNOSIS — Z13820 Encounter for screening for osteoporosis: Secondary | ICD-10-CM

## 2014-04-24 DIAGNOSIS — Z23 Encounter for immunization: Secondary | ICD-10-CM

## 2014-04-24 DIAGNOSIS — M8580 Other specified disorders of bone density and structure, unspecified site: Secondary | ICD-10-CM

## 2014-04-24 LAB — HM DEXA SCAN

## 2014-04-24 NOTE — Addendum Note (Signed)
Addended by: Cherre Robins on: 04/24/2014 10:59 AM   Modules accepted: Orders, Medications, Level of Service

## 2014-04-24 NOTE — Patient Instructions (Signed)
Fall Prevention and Home Safety Falls cause injuries and can affect all age groups. It is possible to use preventive measures to significantly decrease the likelihood of falls. There are many simple measures which can make your home safer and prevent falls. OUTDOORS  Repair cracks and edges of walkways and driveways.  Remove high doorway thresholds.  Trim shrubbery on the main path into your home.  Have good outside lighting.  Clear walkways of tools, rocks, debris, and clutter.  Check that handrails are not broken and are securely fastened. Both sides of steps should have handrails.  Have leaves, snow, and ice cleared regularly.  Use sand or salt on walkways during winter months.  In the garage, clean up grease or oil spills. BATHROOM  Install night lights.  Install grab bars by the toilet and in the tub and shower.  Use non-skid mats or decals in the tub or shower.  Place a plastic non-slip stool in the shower to sit on, if needed.  Keep floors dry and clean up all water on the floor immediately.  Remove soap buildup in the tub or shower on a regular basis.  Secure bath mats with non-slip, double-sided rug tape.  Remove throw rugs and tripping hazards from the floors. BEDROOMS  Install night lights.  Make sure a bedside light is easy to reach.  Do not use oversized bedding.  Keep a telephone by your bedside.  Have a firm chair with side arms to use for getting dressed.  Remove throw rugs and tripping hazards from the floor. KITCHEN  Keep handles on pots and pans turned toward the center of the stove. Use back burners when possible.  Clean up spills quickly and allow time for drying.  Avoid walking on wet floors.  Avoid hot utensils and knives.  Position shelves so they are not too high or low.  Place commonly used objects within easy reach.  If necessary, use a sturdy step stool with a grab bar when reaching.  Keep electrical cables out of the  way.  Do not use floor polish or wax that makes floors slippery. If you must use wax, use non-skid floor wax.  Remove throw rugs and tripping hazards from the floor. STAIRWAYS  Never leave objects on stairs.  Place handrails on both sides of stairways and use them. Fix any loose handrails. Make sure handrails on both sides of the stairways are as long as the stairs.  Check carpeting to make sure it is firmly attached along stairs. Make repairs to worn or loose carpet promptly.  Avoid placing throw rugs at the top or bottom of stairways, or properly secure the rug with carpet tape to prevent slippage. Get rid of throw rugs, if possible.  Have an electrician put in a light switch at the top and bottom of the stairs. OTHER FALL PREVENTION TIPS  Wear low-heel or rubber-soled shoes that are supportive and fit well. Wear closed toe shoes.  When using a stepladder, make sure it is fully opened and both spreaders are firmly locked. Do not climb a closed stepladder.  Add color or contrast paint or tape to grab bars and handrails in your home. Place contrasting color strips on first and last steps.  Learn and use mobility aids as needed. Install an electrical emergency response system.  Turn on lights to avoid dark areas. Replace light bulbs that burn out immediately. Get light switches that glow.  Arrange furniture to create clear pathways. Keep furniture in the same place.    Firmly attach carpet with non-skid or double-sided tape.  Eliminate uneven floor surfaces.  Select a carpet pattern that does not visually hide the edge of steps.  Be aware of all pets. OTHER HOME SAFETY TIPS  Set the water temperature for 120 F (48.8 C).  Keep emergency numbers on or near the telephone.  Keep smoke detectors on every level of the home and near sleeping areas. Document Released: 06/04/2002 Document Revised: 12/14/2011 Document Reviewed: 09/03/2011 ExitCare Patient Information 2015  [employer]. This information is not intended to replace advice given to you by your health care provider. Make sure you discuss any questions you have with your health care provider.                Exercise for Strong Bones  Exercise is important to build and maintain strong bones / bone density.  There are 2 types of exercises that are important to building and maintaining strong bones:  Weight- bearing and muscle-stregthening.  Weight-bearing Exercises  These exercises include activities that make you move against gravity while staying upright. Weight-bearing exercises can be high-impact or low-impact.  High-impact weight-bearing exercises help build bones and keep them strong. If you have broken a bone due to osteoporosis or are at risk of breaking a bone, you may need to avoid high-impact exercises. If you're not sure, you should check with your healthcare provider.  Examples of high-impact weight-bearing exercises are: Dancing  Doing high-impact aerobics  Hiking  Jogging/running  Jumping Rope  Stair climbing  Tennis  Low-impact weight-bearing exercises can also help keep bones strong and are a safe alternative if you cannot do high-impact exercises.   Examples of low-impact weight-bearing exercises are: Using elliptical training machines  Doing low-impact aerobics  Using stair-step machines  Fast walking on a treadmill or outside   Muscle-Strengthening Exercises These exercises include activities where you move your body, a weight or some other resistance against gravity. They are also known as resistance exercises and include: Lifting weights  Using elastic exercise bands  Using weight machines  Lifting your own body weight  Functional movements, such as standing and rising up on your toes  Yoga and Pilates can also improve strength, balance and flexibility. However, certain positions may not be safe for people with osteoporosis or those at increased risk of broken  bones. For example, exercises that have you bend forward may increase the chance of breaking a bone in the spine.   Non-Impact Exercises There are other types of exercises that can help prevent falls.  Non-impact exercises can help you to improve balance, posture and how well you move in everyday activities. Some of these exercises include: Balance exercises that strengthen your legs and test your balance, such as Tai Chi, can decrease your risk of falls.  Posture exercises that improve your posture and reduce rounded or "sloping" shoulders can help you decrease the chance of breaking a bone, especially in the spine.  Functional exercises that improve how well you move can help you with everyday activities and decrease your chance of falling and breaking a bone. For example, if you have trouble getting up from a chair or climbing stairs, you should do these activities as exercises.   **A physical therapist can teach you balance, posture and functional exercises. He/she can also help you learn which exercises are safe and appropriate for you.  [facility] has a physical therapy office in [place] in front of our office and referrals can be made for assessments   and treatment as needed and strength and balance training.  If you would like to have an assessment with [facility] and our physical therapy team please let a nurse or provider know.    

## 2014-04-24 NOTE — Progress Notes (Addendum)
Patient ID: Brenda Solis, female   DOB: [birth year], 79 y.o.   MRN: [id number]  [facility] Current Height: Height: 5\' 8"  (172.7 cm)      Max Lifetime Height:  5'9" Current Weight: Weight: 178 lb (80.74 kg)       Ethnicity:Caucasian   HPI: Does pt already have a diagnosis of:  Osteopenia?  No Osteoporosis?  No  Back Pain?  Yes - arthritis and history of past back surgery 02/2013  Has appt for spinal steroid injection in November 4th 2015 Kyphosis?  No Prior fracture?  Yes - right foot  Med(s) for Osteoporosis/Osteopenia:  none Med(s) previously tried for Osteoporosis/Osteopenia:  none                                                             PMH: Age at menopause:  79 yo Hysterectomy?  No Oophorectomy?  No HRT? Yes - Former.  Type/duration: unsure type - only took for a few months Steroid Use?  No Thyroid med?  Yes History of cancer?  No History of digestive disorders (ie Crohn's)?  No Current or previous eating disorders?  No Last Vitamin D Result:  34.6 (02/01/2014) Last GFR Result:  51 (02/01/2014)   FH/SH: Family history of osteoporosis?  Yes - mother Parent with history of hip fracture?  No Family history of breast cancer?  Yes - mother Exercise?  No Smoking?  No Alcohol?  No    Calcium Assessment Calcium Intake  # of servings/day  Calcium mg  Milk (8 oz) 1-2  x  300  = 300- 600mg   Yogurt (4 oz) 0 x  200 = 0  Cheese (1 oz) 1 x  200 = 200mg   Other Calcium sources   250mg   Ca supplement none = 0   Estimated calcium intake per day 750mg  - 1050mg     DEXA Results Date of Test T-Score for AP Spine L1-L4 T-Score for Neck of Left Hip T-Score for Total Right Hip  04/24/2014 0.9 -1.4 -0.7  05/13/2010 0.8 -1.2 -0.3  07/25/2006 0.7 -0.8 -0.2  02/07/2001 0.6 -0.1 0.1   FRAX 10 year estimate: Total FX risk:  18%  (consider medication if >/= 20%) Hip FX risk:  3.4%  (consider medication if >/= 3%)  Assessment: Osteopenia   Recommendations: 1.   Discussed fracture risk and FRAX estimate.  Results of BMD discussed 2.  recommend calcium 1200mg  daily through supplementation and / or diet.  3.  recommend weight bearing exercise - 30 minutes at least 4 days per week - as able with back pain.  4.  Counseled and educated about fall risk and prevention. 5.   Influenza vaccine given in office today 6.  Recommended decrease zolpidem 10mg  to 1/2 tablet qhs and try to discontinue in future due to patient's age and increased fall risk.  7.  Also recommended discontinue meloxicam since starting ketoprofen  Recheck DEXA:  2 years  Time spent counseling patient:  20 minutes

## 2014-04-30 ENCOUNTER — Other Ambulatory Visit: Payer: Self-pay | Admitting: Nurse Practitioner

## 2014-05-01 NOTE — Telephone Encounter (Signed)
Called in to [place].

## 2014-05-01 NOTE — Telephone Encounter (Signed)
Last ordered 03/13/14 #30 no refills, last seen 03/05/14, phone in [place] appt scheduled

## 2014-05-01 NOTE — Telephone Encounter (Signed)
Please call in ambien 10 mg  1 po qhs #30 with 1 refills 

## 2014-06-15 ENCOUNTER — Emergency Department (HOSPITAL_BASED_OUTPATIENT_CLINIC_OR_DEPARTMENT_OTHER)
Admission: EM | Admit: 2014-06-15 | Discharge: 2014-06-15 | Disposition: A | Discharge status: Home / Self Care | Payer: Medicare Other | Attending: Emergency Medicine | Admitting: Emergency Medicine

## 2014-06-15 ENCOUNTER — Encounter (HOSPITAL_BASED_OUTPATIENT_CLINIC_OR_DEPARTMENT_OTHER): Payer: Self-pay

## 2014-06-15 ENCOUNTER — Emergency Department (HOSPITAL_BASED_OUTPATIENT_CLINIC_OR_DEPARTMENT_OTHER): Payer: Medicare Other

## 2014-06-15 DIAGNOSIS — F329 Major depressive disorder, single episode, unspecified: Secondary | ICD-10-CM | POA: Diagnosis not present

## 2014-06-15 DIAGNOSIS — Z79899 Other long term (current) drug therapy: Secondary | ICD-10-CM | POA: Diagnosis not present

## 2014-06-15 DIAGNOSIS — E039 Hypothyroidism, unspecified: Secondary | ICD-10-CM | POA: Insufficient documentation

## 2014-06-15 DIAGNOSIS — F428 Other obsessive-compulsive disorder: Secondary | ICD-10-CM | POA: Insufficient documentation

## 2014-06-15 DIAGNOSIS — F419 Anxiety disorder, unspecified: Secondary | ICD-10-CM | POA: Insufficient documentation

## 2014-06-15 DIAGNOSIS — F919 Conduct disorder, unspecified: Secondary | ICD-10-CM | POA: Diagnosis not present

## 2014-06-15 DIAGNOSIS — I10 Essential (primary) hypertension: Secondary | ICD-10-CM | POA: Insufficient documentation

## 2014-06-15 DIAGNOSIS — E785 Hyperlipidemia, unspecified: Secondary | ICD-10-CM | POA: Insufficient documentation

## 2014-06-15 DIAGNOSIS — F39 Unspecified mood [affective] disorder: Secondary | ICD-10-CM | POA: Insufficient documentation

## 2014-06-15 DIAGNOSIS — Z008 Encounter for other general examination: Secondary | ICD-10-CM | POA: Diagnosis present

## 2014-06-15 LAB — COMPREHENSIVE METABOLIC PANEL
ALK PHOS: 55 U/L (ref 39–117)
ALT: 18 U/L (ref 0–35)
ANION GAP: 13 (ref 5–15)
AST: 26 U/L (ref 0–37)
Albumin: 4.3 g/dL (ref 3.5–5.2)
BUN: 17 mg/dL (ref 6–23)
CALCIUM: 10.5 mg/dL (ref 8.4–10.5)
CO2: 23 mEq/L (ref 19–32)
Chloride: 105 mEq/L (ref 96–112)
Creatinine, Ser: 0.9 mg/dL (ref 0.50–1.10)
GFR calc non Af Amer: 60 mL/min — ABNORMAL LOW (ref >90)
GFR, EST AFRICAN AMERICAN: 70 mL/min — AB (ref >90)
GLUCOSE: 106 mg/dL — AB (ref 70–99)
Potassium: 4.2 mEq/L (ref 3.7–5.3)
Sodium: 141 mEq/L (ref 137–147)
Total Bilirubin: 0.6 mg/dL (ref 0.3–1.2)
Total Protein: 7.5 g/dL (ref 6.0–8.3)

## 2014-06-15 LAB — RAPID URINE DRUG SCREEN, HOSP PERFORMED
Amphetamines: NOT DETECTED
BENZODIAZEPINES: NOT DETECTED
Barbiturates: NOT DETECTED
Cocaine: NOT DETECTED
OPIATES: NOT DETECTED
Tetrahydrocannabinol: NOT DETECTED

## 2014-06-15 LAB — URINALYSIS, ROUTINE W REFLEX MICROSCOPIC
BILIRUBIN URINE: NEGATIVE
GLUCOSE, UA: NEGATIVE mg/dL
HGB URINE DIPSTICK: NEGATIVE
KETONES UR: NEGATIVE mg/dL
Leukocytes, UA: NEGATIVE
NITRITE: NEGATIVE
Protein, ur: NEGATIVE mg/dL
SPECIFIC GRAVITY, URINE: 1.013 (ref 1.005–1.030)
Urobilinogen, UA: 1 mg/dL (ref 0.0–1.0)
pH: 7 (ref 5.0–8.0)

## 2014-06-15 LAB — CBC
HEMATOCRIT: 39.2 % (ref 36.0–46.0)
HEMOGLOBIN: 13.2 g/dL (ref 12.0–15.0)
MCH: 31.2 pg (ref 26.0–34.0)
MCHC: 33.7 g/dL (ref 30.0–36.0)
MCV: 92.7 fL (ref 78.0–100.0)
Platelets: 215 10*3/uL (ref 150–400)
RBC: 4.23 MIL/uL (ref 3.87–5.11)
RDW: 13.5 % (ref 11.5–15.5)
WBC: 7.2 10*3/uL (ref 4.0–10.5)

## 2014-06-15 LAB — TSH: TSH: 2.83 u[IU]/mL (ref 0.350–4.500)

## 2014-06-15 LAB — ETHANOL

## 2014-06-15 MED ORDER — RISPERIDONE 1 MG PO TABS: 0.2500 mg | ORAL_TABLET | Freq: Every day | ORAL | Status: DC

## 2014-06-15 MED ORDER — LORAZEPAM 1 MG PO TABS
0.5000 mg | ORAL_TABLET | Freq: Once | ORAL | Status: AC
  Administered 2014-06-15: 0.5 mg via ORAL
  Filled 2014-06-15: qty 1
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
## 2014-06-15 NOTE — ED Provider Notes (Signed)
CSN: [id number]     Arrival date & time 06/15/14  0746 History   First MD Initiated Contact with Patient 06/15/14 [phone]     Chief Complaint  Patient presents with  . Medical Clearance     (Consider location/radiation/quality/duration/timing/severity/associated sxs/prior Treatment) HPI 79 year old female who presents today stating that for the past 4 days she has had thoughts that she cannot get out of her head. She voices these as not religious thoughts which cause her her great anxiety as she states that she is "a [place]" and that these thoughts are making her ashamed. She states that these thoughts are present constantly while she is awake and she is unable to get them out of her mind. She has been sleeping she takes Ambien at night. She denies any sleep problems. She denies any head injury, headaches, fever, chills, or recent changes in her medication. She has a history of a reactive depression her husband died several years ago and has been on citalopram since that time but she has not had any major depressive disorders, bipolar episodes, or psychoses in the past. She has no history of substance abuse. Past Medical History  Diagnosis Date  . Thyroid disease     hypothyroidism  . Insomnia, unspecified   . Spondylosis of unspecified site without mention of myelopathy   . Hypertension   . Hyperlipidemia   . Depression    Past Surgical History  Procedure Laterality Date  . Total hip arthroplasty  2011    RIGHT  . Cataract extraction, bilateral Bilateral 2006  . Knee surgery Right 2009    meniscus repair  . Cervical disc surgery  1999  . Cesarean section  1962 and 1967  . Cholecystectomy  1990  . Decompressive lumbar laminectomy level 1 N/A 02/01/2013    Procedure: CENTRAL DECOMPRESSIVE LUMBAR LAMINECTOMY LEVEL 1 L4-L5;  Surgeon: Tobi Bastos, MD;  Location: [facility];  Service: Orthopedics;  Laterality: N/A;   Family History  Problem Relation Age of Onset  . Stroke      FAMILY   . Esophageal varices      FAMILY  . Coronary artery disease Brother     62s  . Heart disease Brother   . Cancer Mother     breast  . Osteoporosis Mother   . Osteoporosis Maternal Aunt    History  Substance Use Topics  . Smoking status: Never Smoker   . Smokeless tobacco: Never Used  . Alcohol Use: No   OB History    No data available     Review of Systems  All other systems reviewed and are negative.     Allergies  Gabapentin and Oxycodone  Home Medications   Prior to Admission medications   Medication Sig Start Date End Date Taking? Authorizing Provider  ALPRAZolam Duanne Moron) 0.25 MG tablet TAKE ONE TABLET BY MOUTH AT BEDTIME AS NEEDED 03/13/14   Mary-Margaret Hassell Done, FNP  atorvastatin (LIPITOR) 40 MG tablet TAKE ONE TABLET BY MOUTH AT BEDTIME    Mary-Margaret Hassell Done, FNP  cholecalciferol (VITAMIN D) 1000 UNITS tablet Take 2,000 Units by mouth daily.      Historical Provider, MD  citalopram (CELEXA) 40 MG tablet Take 1 tablet (40 mg total) by mouth daily. 02/01/14   Mary-Margaret Hassell Done, FNP  ketoprofen (ORUDIS) 50 MG capsule Take 1 capsule (50 mg total) by mouth every 8 (eight) hours as needed. 04/23/14   Mary-Margaret Hassell Done, FNP  levothyroxine (SYNTHROID, LEVOTHROID) 25 MCG tablet Take 1 tablet (25 mcg total)  by mouth daily before breakfast. 02/06/14   Mary-Margaret Hassell Done, FNP  meclizine (ANTIVERT) 25 MG tablet Take 25 mg by mouth 3 (three) times daily as needed (for inner ear problems).     Historical Provider, MD  metoprolol succinate (TOPROL-XL) 25 MG 24 hr tablet Take 1 tablet (25 mg total) by mouth every morning. 03/13/14   Lysbeth Penner, FNP  traMADol (ULTRAM) 50 MG tablet Take 50 mg by mouth every 6 (six) hours as needed.    Historical Provider, MD  zolpidem (AMBIEN) 10 MG tablet TAKE ONE TABLET BY MOUTH AT BEDTIME 05/01/14   Mary-Margaret Hassell Done, FNP   BP 130/46 mmHg  Pulse 60  Temp(Src) 98.3 F (36.8 C) (Oral)  Resp 16  Ht 5\' 8"  (1.727 m)  Wt 175 lb (79.379  kg)  BMI 26.61 kg/m2  SpO2 96% Physical Exam  Constitutional: She is oriented to person, place, and time. She appears well-developed and well-nourished.  HENT:  Head: Normocephalic and atraumatic.  Right Ear: External ear normal.  Left Ear: External ear normal.  Nose: Nose normal.  Mouth/Throat: Oropharynx is clear and moist.  Eyes: Conjunctivae and EOM are normal. Pupils are equal, round, and reactive to light.  Neck: Normal range of motion. Neck supple.  Cardiovascular: Normal rate, regular rhythm, normal heart sounds and intact distal pulses.   Pulmonary/Chest: Effort normal and breath sounds normal.  Abdominal: Soft. Bowel sounds are normal.  Musculoskeletal: Normal range of motion.  Neurological: She is alert and oriented to person, place, and time. She has normal reflexes.  Skin: Skin is warm and dry.  Psychiatric: Her speech is normal and behavior is normal. Judgment normal. Her mood appears anxious. Cognition and memory are normal.  Intrusive thoughts present.  Nursing note and vitals reviewed.   ED Course  Procedures (including critical care time) Labs Review Labs Reviewed  COMPREHENSIVE METABOLIC PANEL - Abnormal; Notable for the following:    Glucose, Bld 106 (*)    GFR calc non Af Amer 60 (*)    GFR calc Af Amer 70 (*)    All other components within normal limits  URINALYSIS, ROUTINE W REFLEX MICROSCOPIC - Abnormal; Notable for the following:    APPearance CLOUDY (*)    All other components within normal limits  CBC  ETHANOL  URINE RAPID DRUG SCREEN (HOSP PERFORMED)  TSH    Imaging Review Ct Head Wo Contrast  06/15/2014   CLINICAL DATA:  Patient here requesting help for "thoughts I just cant get out of my head". Denies suicidal ideation. History of depression but this has been very different the past 4 days. Denies injury, headache and blurred vision. Hx of htn.  EXAM: CT HEAD WITHOUT CONTRAST  TECHNIQUE: Contiguous axial images were obtained from the base of  the skull through the vertex without intravenous contrast.  COMPARISON:  10/27/2010  FINDINGS: Ventricles are normal in size and configuration.  No parenchymal masses or mass effect. There is no evidence of an infarct.  Subtle areas of white matter hypoattenuation are noted consistent with chronic microvascular ischemic change.  No extra-axial masses or abnormal fluid collections.  No intracranial hemorrhage.  Visualized sinuses and mastoid air cells are clear. No skull lesion.  IMPRESSION: 1. No acute intracranial abnormalities. Stable appearance from the prior head CT.   Electronically Signed   By: Lajean Manes M.D.   On: 06/15/2014 09:08    MDM   Final diagnoses:  Obsessional thoughts   Patient care discussed with [facility] nurse practitioner  on 4 behavioral health. We will start Risperdal 0.25 mg daily at bedtime. She is arranging for outpatient follow-up.  Shaune Pollack, MD 06/15/14 [phone]

## 2014-06-15 NOTE — ED Notes (Signed)
TTS assessment in process  

## 2014-06-15 NOTE — Discharge Instructions (Signed)
Please follow up as instructed by behavioral health.

## 2014-06-15 NOTE — ED Notes (Signed)
Patient here requesting help for "thoughts I just cant get out of my head". Denies suicidal ideation. History of depression but this has been very different the past 4 days.

## 2014-06-15 NOTE — ED Notes (Signed)
MD at bedside. 
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
## 2014-06-15 NOTE — ED Notes (Signed)
Patient excorted to family room to wait for additional discharge instructions from behavorial health

## 2014-06-18 ENCOUNTER — Other Ambulatory Visit: Payer: Self-pay | Admitting: Nurse Practitioner

## 2014-06-18 ENCOUNTER — Telehealth: Payer: Self-pay | Admitting: Nurse Practitioner

## 2014-06-18 NOTE — Patient Instructions (Signed)
[facility]- Therapist [address] [place] ,[place] [address] [phone] Children limited to anxiety and depression- NO ADD/ADHD Does not accept Medicaid  [facility] [address] [place], [place] [phone] Does see children Does accept medicaid Will assess for Autism but not treat  [facility] [address] [place] [phone] Does see children  Does accept Medicaid Medication management- substance abuse- bipolar- grief- family-marriage- OCD- Anxiety- PTSD  [facility] [address] [place]  [phone] Does see children Does accept medicaid They do perform psychological testing  [facility] [address] [place] Schedule through [employer] [phone] Patient must call and make own appointment Does se children Does accept Medicaid  [facility] [address] [place], [place] [phone] Sees Children 7-10 accompanied by an adult, 11 and up by themselves Does accept Medicaid Will see patients with- substance abuse-ADHD-ADD-Bipolar-Domestic violence-Marriage counseling- Family Counseling and sexual abuse  [facility]- Psychologist and Psychiatrist [address] [place] [phone] Does see children Does accept Medicaid  [facility] [address] [place] [phone]  Dr. Lugo-  Psychiatrist [address] [place], [place] [phone] Specializes in ADHD and addictions They do ADHD testing Suboxone clinic  [facility] [address] [place] [phone] Does Child psychological testing  [facility] [address] [place] [phone] Does Accept Medicaid Evaluates for Autism  [facility] [address] [place] [phone] Does Not accept Medicaid Does do adult ADD  evaluations  Dr. Akinlayo [address] [place] [phone] Does not Take Medicaid Sees ADD and ADHD for treatment      [facility] [address] [place], [place] [address] [phone] Takes Medicaid WIll see children as young as 3         

## 2014-06-18 NOTE — Telephone Encounter (Signed)
Patient aware that we are sending a list of counselors home with Brenda Solis and that if she feels in any way that she may harm herself she is advised to go to [facility] for evaluation.

## 2014-07-01 ENCOUNTER — Other Ambulatory Visit: Payer: Self-pay | Admitting: Nurse Practitioner

## 2014-07-01 NOTE — Telephone Encounter (Signed)
Last seen 04/24/14, last filled 06/01/14. Call into [facility]

## 2014-07-01 NOTE — Telephone Encounter (Signed)
Last filled 06/01/14, last seen 04/24/14.

## 2014-07-02 ENCOUNTER — Telehealth: Payer: Self-pay | Admitting: *Deleted

## 2014-07-02 NOTE — Telephone Encounter (Signed)
Please call in ambien with 1 refills 

## 2014-07-02 NOTE — Telephone Encounter (Signed)
Script for [employer] called in.

## 2014-07-09 ENCOUNTER — Encounter: Payer: Self-pay | Admitting: Nurse Practitioner

## 2014-07-09 ENCOUNTER — Telehealth: Payer: Self-pay | Admitting: Nurse Practitioner

## 2014-07-09 ENCOUNTER — Ambulatory Visit (INDEPENDENT_AMBULATORY_CARE_PROVIDER_SITE_OTHER): Payer: Medicare Other | Admitting: Nurse Practitioner

## 2014-07-09 VITALS — BP 153/58 | HR 67 | Temp 96.9°F | Ht 68.0 in | Wt 178.0 lb

## 2014-07-09 DIAGNOSIS — R29898 Other symptoms and signs involving the musculoskeletal system: Secondary | ICD-10-CM

## 2014-07-09 DIAGNOSIS — M47816 Spondylosis without myelopathy or radiculopathy, lumbar region: Secondary | ICD-10-CM

## 2014-07-09 NOTE — Telephone Encounter (Signed)
Appointment given for 3:45 today with Mary Martin, FNP.  

## 2014-07-09 NOTE — Progress Notes (Signed)
   Subjective:    Patient ID: Brenda Solis, female    DOB: 27-Feb-1935, 80 y.o.   MRN: [id number]  HPI Patient in c/o low blood pressure but she thought it should be higher than 50 diastolic. She is c/o of feeling like her legs are weak and some trouble with her balance. She says this is has been going on since she had her back surgery in 2014. Pain starts in lower back and goes all the way down to her ankles. Dr. Nelva Bush injected her back 2 weeks ago and she can see no difference. She refuses PT because it is $40 everytime she goes.    Review of Systems  Constitutional: Negative.   HENT: Negative.   Respiratory: Negative.   Cardiovascular: Negative.   Gastrointestinal: Negative.   Genitourinary: Negative.   Neurological: Negative.   Psychiatric/Behavioral: Negative.   All other systems reviewed and are negative.      Objective:   Physical Exam  Constitutional: She is oriented to person, place, and time. She appears well-developed and well-nourished.  Cardiovascular: Normal rate, regular rhythm and normal heart sounds.   Pulmonary/Chest: Effort normal and breath sounds normal.  Musculoskeletal:  Decrease ROM of lumbar spine due to pain with flexion and extension. (-) SLR bil Motor strength and sensation distally intact  Neurological: She is alert and oriented to person, place, and time. She has normal reflexes.  Skin: Skin is warm.  Psychiatric: She has a normal mood and affect. Her behavior is normal. Judgment and thought content normal.   BP 153/58 mmHg  Pulse 67  Temp(Src) 96.9 F (36.1 C) (Oral)  Ht 5\' 8"  (1.727 m)  Wt 178 lb (80.74 kg)  BMI 27.07 kg/m2        Assessment & Plan:   1. Lumbar spondylosis, unspecified spinal osteoarthritis   2. Weakness of both lower extremities    Ride exercise bike daily Back strengthening exercises RTO for routine follow up.   Brenda Hassell Done, FNP

## 2014-07-09 NOTE — Patient Instructions (Signed)

## 2014-07-12 ENCOUNTER — Ambulatory Visit (INDEPENDENT_AMBULATORY_CARE_PROVIDER_SITE_OTHER): Payer: Medicare Other | Admitting: Nurse Practitioner

## 2014-07-12 ENCOUNTER — Encounter: Payer: Self-pay | Admitting: Nurse Practitioner

## 2014-07-12 VITALS — BP 127/55 | HR 66 | Temp 97.7°F | Ht 68.0 in | Wt 178.0 lb

## 2014-07-12 DIAGNOSIS — D485 Neoplasm of uncertain behavior of skin: Secondary | ICD-10-CM

## 2014-07-12 DIAGNOSIS — L989 Disorder of the skin and subcutaneous tissue, unspecified: Secondary | ICD-10-CM

## 2014-07-12 DIAGNOSIS — Z0000 Encounter for general adult medical examination without abnormal findings: Secondary | ICD-10-CM | POA: Diagnosis not present

## 2014-07-12 DIAGNOSIS — L821 Other seborrheic keratosis: Secondary | ICD-10-CM | POA: Diagnosis not present

## 2014-07-12 DIAGNOSIS — D487 Neoplasm of uncertain behavior of other specified sites: Secondary | ICD-10-CM | POA: Diagnosis not present

## 2014-07-12 NOTE — Patient Instructions (Signed)

## 2014-07-12 NOTE — Progress Notes (Signed)
   Subjective:    Patient ID: Brenda Solis, female    DOB: April 03, 1935, 79 y.o.   MRN: [id number]  HPI  Patient in today for mole removal- mole right temporal area- wants removed.   Review of Systems  Constitutional: Negative.   HENT: Negative.   Respiratory: Negative.   Cardiovascular: Negative.   Gastrointestinal: Negative.   Genitourinary: Negative.   Neurological: Negative.   Psychiatric/Behavioral: Negative.   All other systems reviewed and are negative.      Objective:   Physical Exam  Constitutional: She is oriented to person, place, and time. She appears well-developed and well-nourished.  Cardiovascular: Normal rate, regular rhythm and normal heart sounds.   Pulmonary/Chest: Effort normal and breath sounds normal.  Neurological: She is alert and oriented to person, place, and time.  Skin: Skin is warm.  2cm annular raised flesh colored lesion right temporal area    BP 127/55 mmHg  Pulse 66  Temp(Src) 97.7 F (36.5 C) (Oral)  Ht 5\' 8"  (1.727 m)  Wt 178 lb (80.74 kg)  BMI 27.07 kg/m2  Procedure:  Lidocaine 1% with epi- 1cc local Cleaned with betadine Lesion shaved off Silver nitrate sticks for cauterization Cleaned with Saline Antibiotic ointment     Assessment & Plan:

## 2014-07-12 NOTE — Addendum Note (Signed)
Addended by: Earlene Plater on: 07/12/2014 04:58 PM   Modules accepted: Orders

## 2014-07-15 ENCOUNTER — Telehealth: Payer: Self-pay | Admitting: Nurse Practitioner

## 2014-07-15 NOTE — Telephone Encounter (Signed)
Notified pt okay to put Bacitracin ointment on wound Verbalizes understanding

## 2014-07-17 DIAGNOSIS — Z961 Presence of intraocular lens: Secondary | ICD-10-CM | POA: Diagnosis not present

## 2014-07-17 DIAGNOSIS — H5212 Myopia, left eye: Secondary | ICD-10-CM | POA: Diagnosis not present

## 2014-07-17 DIAGNOSIS — H5201 Hypermetropia, right eye: Secondary | ICD-10-CM | POA: Diagnosis not present

## 2014-07-17 DIAGNOSIS — H52223 Regular astigmatism, bilateral: Secondary | ICD-10-CM | POA: Diagnosis not present

## 2014-07-17 LAB — PATHOLOGY

## 2014-07-24 DIAGNOSIS — M5126 Other intervertebral disc displacement, lumbar region: Secondary | ICD-10-CM | POA: Diagnosis not present

## 2014-07-30 ENCOUNTER — Other Ambulatory Visit: Payer: Self-pay | Admitting: Nurse Practitioner

## 2014-07-31 NOTE — Telephone Encounter (Signed)
Looks like she should have another month?

## 2014-07-31 NOTE — Telephone Encounter (Signed)
rx  Called into pharmacy

## 2014-07-31 NOTE — Telephone Encounter (Signed)
Please call in ambien with 1 refills 

## 2014-08-14 ENCOUNTER — Other Ambulatory Visit: Payer: Self-pay | Admitting: Nurse Practitioner

## 2014-08-28 ENCOUNTER — Other Ambulatory Visit: Payer: Self-pay

## 2014-08-28 MED ORDER — RISPERIDONE 1 MG PO TABS: 0.2500 mg | ORAL_TABLET | Freq: Every day | ORAL | Status: DC

## 2014-08-28 NOTE — Telephone Encounter (Signed)
Last seen 07/12/14 MMM If approved route to nurse to call into [facility]

## 2014-09-12 DIAGNOSIS — M4696 Unspecified inflammatory spondylopathy, lumbar region: Secondary | ICD-10-CM | POA: Diagnosis not present

## 2014-09-12 DIAGNOSIS — M4806 Spinal stenosis, lumbar region: Secondary | ICD-10-CM | POA: Diagnosis not present

## 2014-09-18 ENCOUNTER — Other Ambulatory Visit: Payer: Self-pay | Admitting: Orthopedic Surgery

## 2014-09-18 DIAGNOSIS — Z9889 Other specified postprocedural states: Secondary | ICD-10-CM

## 2014-09-18 DIAGNOSIS — M79605 Pain in left leg: Secondary | ICD-10-CM

## 2014-09-18 DIAGNOSIS — M545 Low back pain: Principal | ICD-10-CM

## 2014-09-18 DIAGNOSIS — M48061 Spinal stenosis, lumbar region without neurogenic claudication: Secondary | ICD-10-CM

## 2014-09-18 DIAGNOSIS — M79604 Pain in right leg: Secondary | ICD-10-CM

## 2014-09-18 DIAGNOSIS — G8929 Other chronic pain: Secondary | ICD-10-CM

## 2014-09-30 ENCOUNTER — Other Ambulatory Visit: Payer: Self-pay | Admitting: Orthopedic Surgery

## 2014-09-30 ENCOUNTER — Inpatient Hospital Stay
Admission: RE | Admit: 2014-09-30 | Discharge: 2014-09-30 | Disposition: A | Discharge status: Home / Self Care | Payer: Self-pay | Source: Ambulatory Visit | Attending: Orthopedic Surgery | Admitting: Orthopedic Surgery

## 2014-09-30 DIAGNOSIS — M545 Low back pain: Secondary | ICD-10-CM

## 2014-10-01 ENCOUNTER — Ambulatory Visit
Admission: RE | Admit: 2014-10-01 | Discharge: 2014-10-01 | Disposition: A | Discharge status: Home / Self Care | Payer: 59 | Source: Ambulatory Visit | Attending: Orthopedic Surgery | Admitting: Orthopedic Surgery

## 2014-10-01 ENCOUNTER — Other Ambulatory Visit: Payer: Self-pay | Admitting: *Deleted

## 2014-10-01 DIAGNOSIS — M79605 Pain in left leg: Secondary | ICD-10-CM | POA: Diagnosis not present

## 2014-10-01 DIAGNOSIS — Z9889 Other specified postprocedural states: Secondary | ICD-10-CM

## 2014-10-01 DIAGNOSIS — M545 Low back pain, unspecified: Secondary | ICD-10-CM

## 2014-10-01 DIAGNOSIS — I7090 Unspecified atherosclerosis: Secondary | ICD-10-CM | POA: Diagnosis not present

## 2014-10-01 DIAGNOSIS — M48061 Spinal stenosis, lumbar region without neurogenic claudication: Secondary | ICD-10-CM

## 2014-10-01 DIAGNOSIS — M79604 Pain in right leg: Secondary | ICD-10-CM

## 2014-10-01 DIAGNOSIS — G8929 Other chronic pain: Secondary | ICD-10-CM

## 2014-10-01 MED ORDER — IOHEXOL 180 MG/ML  SOLN
15.0000 mL | Freq: Once | INTRAMUSCULAR | Status: AC | PRN
  Administered 2014-10-01: 15 mL via INTRATHECAL

## 2014-10-01 MED ORDER — MEPERIDINE HCL 100 MG/ML IJ SOLN
50.0000 mg | Freq: Once | INTRAMUSCULAR | Status: AC
  Administered 2014-10-01: 50 mg via INTRAMUSCULAR

## 2014-10-01 MED ORDER — ZOLPIDEM TARTRATE 10 MG PO TABS: 10.0000 mg | ORAL_TABLET | Freq: Every day | ORAL | Status: DC

## 2014-10-01 MED ORDER — ONDANSETRON HCL 4 MG/2ML IJ SOLN: 4.0000 mg | Freq: Four times a day (QID) | INTRAMUSCULAR | Status: DC | PRN

## 2014-10-01 MED ORDER — ONDANSETRON HCL 4 MG/2ML IJ SOLN
4.0000 mg | Freq: Once | INTRAMUSCULAR | Status: AC
  Administered 2014-10-01: 4 mg via INTRAMUSCULAR

## 2014-10-01 MED ORDER — DIAZEPAM 5 MG PO TABS
5.0000 mg | ORAL_TABLET | Freq: Once | ORAL | Status: AC
Start: 2014-10-01 — End: 2014-10-01
  Administered 2014-10-01: 5 mg via ORAL

## 2014-10-01 NOTE — Progress Notes (Signed)
Pt states she has been off Risperdal, Tramadol and Celexa for the past 2 days. Discharge instructions explained to pt.

## 2014-10-01 NOTE — Telephone Encounter (Signed)
Please call in ambien with 1 refills 

## 2014-10-01 NOTE — Discharge Instructions (Signed)
Myelogram Discharge Instructions  1. Go home and rest quietly for the next 24 hours.  It is important to lie flat for the next 24 hours.  Get up only to go to the restroom.  You may lie in the bed or on a couch on your back, your stomach, your left side or your right side.  You may have one pillow under your head.  You may have pillows between your knees while you are on your side or under your knees while you are on your back.  2. DO NOT drive today.  Recline the seat as far back as it will go, while still wearing your seat belt, on the way home.  3. You may get up to go to the bathroom as needed.  You may sit up for 10 minutes to eat.  You may resume your normal diet and medications unless otherwise indicated.  Drink plenty of extra fluids today and tomorrow.  4. The incidence of a spinal headache with nausea and/or vomiting is about 5% (one in 20 patients).  If you develop a headache, lie flat and drink plenty of fluids until the headache goes away.  Caffeinated beverages may be helpful.  If you develop severe nausea and vomiting or a headache that does not go away with flat bed rest, call [phone].  5. You may resume normal activities after your 24 hours of bed rest is over; however, do not exert yourself strongly or do any heavy lifting tomorrow.  6. Call your physician for a follow-up appointment.   You may resume Celexa, Risperdal and Tramadol on Wednesday, October 02, 2014 after 2:00p.m.

## 2014-10-01 NOTE — Telephone Encounter (Signed)
Last filled 08/28/14, last seen 07/12/14. Call into [facility] as[facility], pt is out

## 2014-10-01 NOTE — Telephone Encounter (Signed)
Refill called to pharmacy.

## 2014-10-07 DIAGNOSIS — M4806 Spinal stenosis, lumbar region: Secondary | ICD-10-CM | POA: Diagnosis not present

## 2014-10-09 ENCOUNTER — Other Ambulatory Visit: Payer: Self-pay | Admitting: Nurse Practitioner

## 2014-10-15 IMAGING — CR DG CHEST 2V
2 series · 2 of 2 positions shown · non-contrast
Comparison: CT 12/06/2012.

CLINICAL DATA: Preoperative evaluation.  History of hypertension.
History of spinal stenosis.

CHEST - 2 VIEW

[w chest pa]
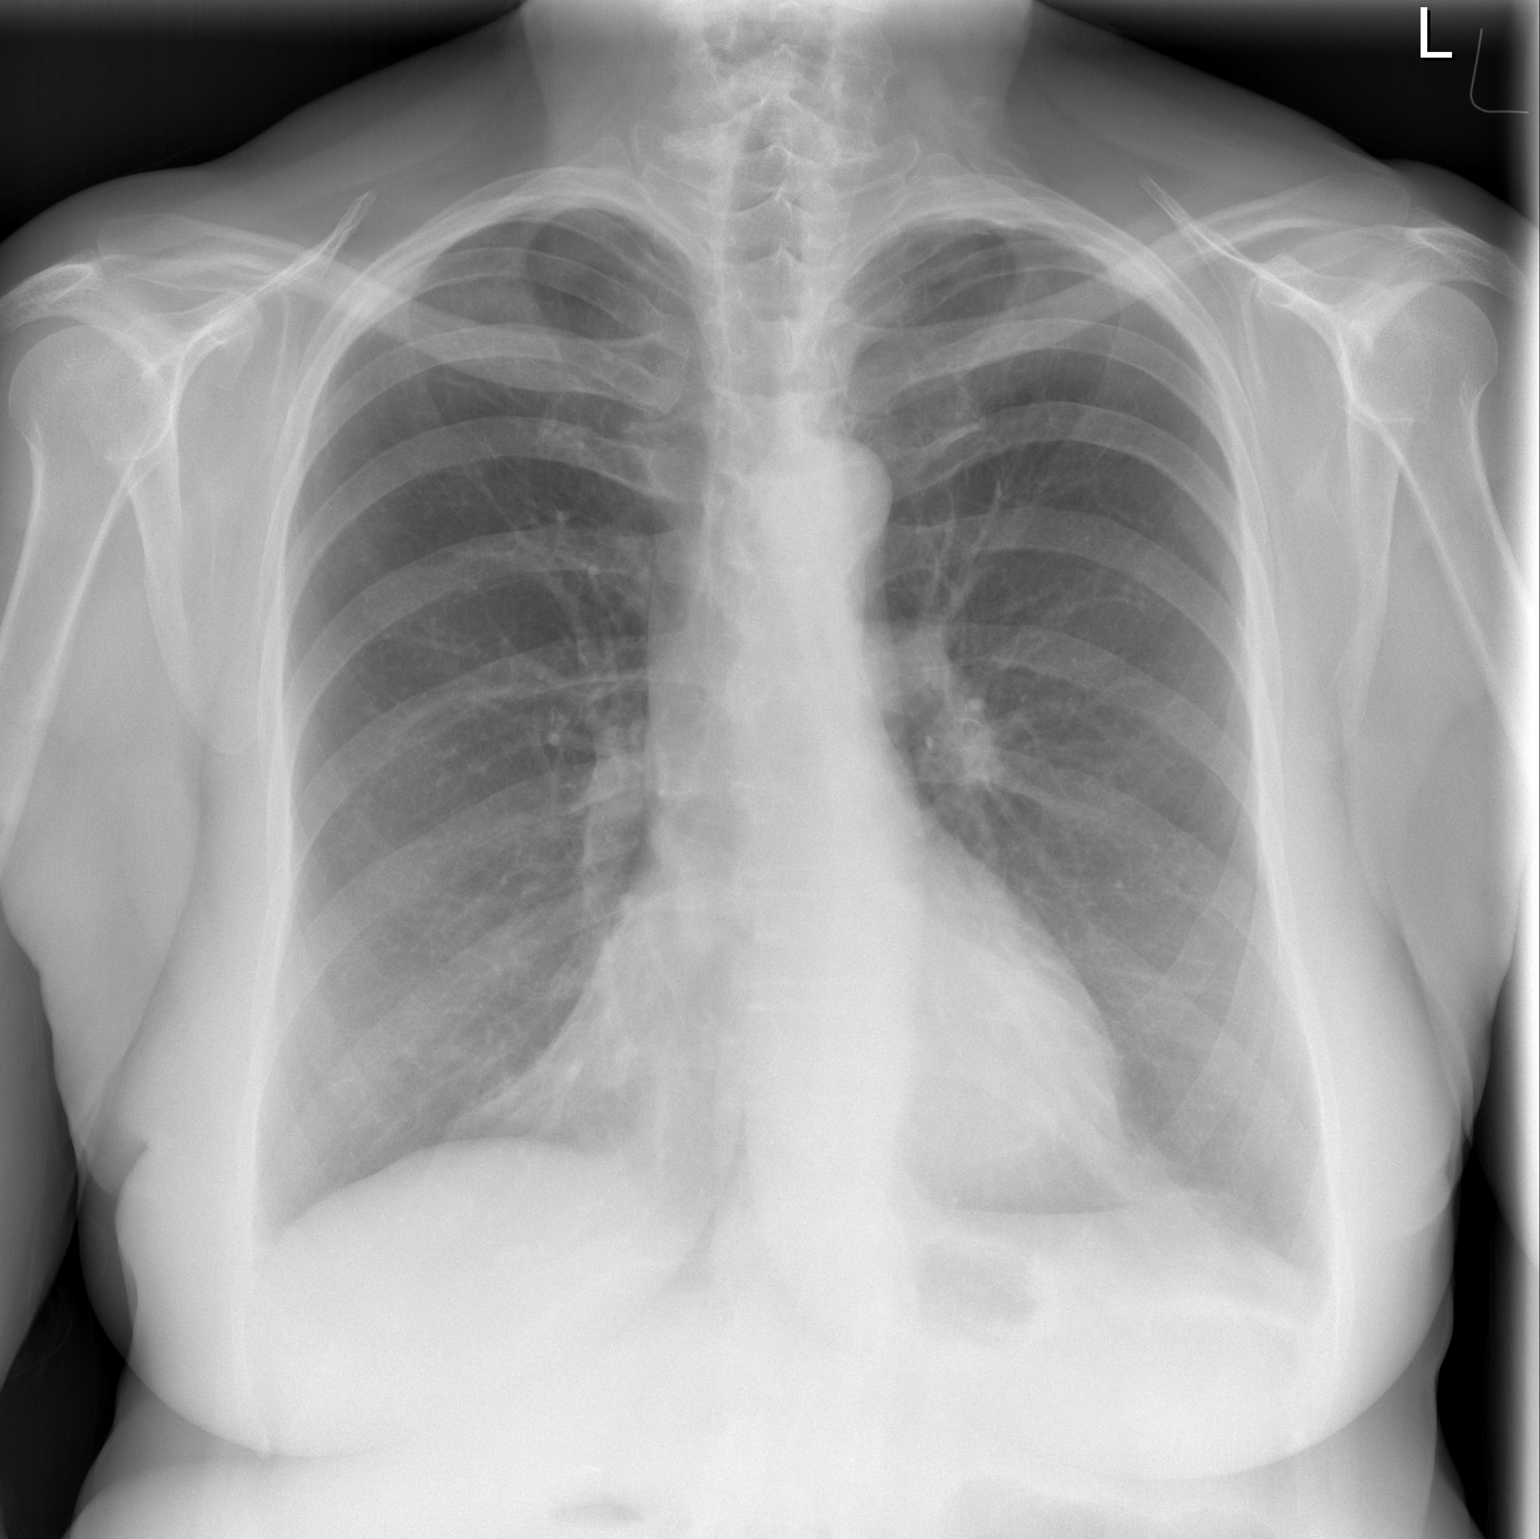

[w chest lat]
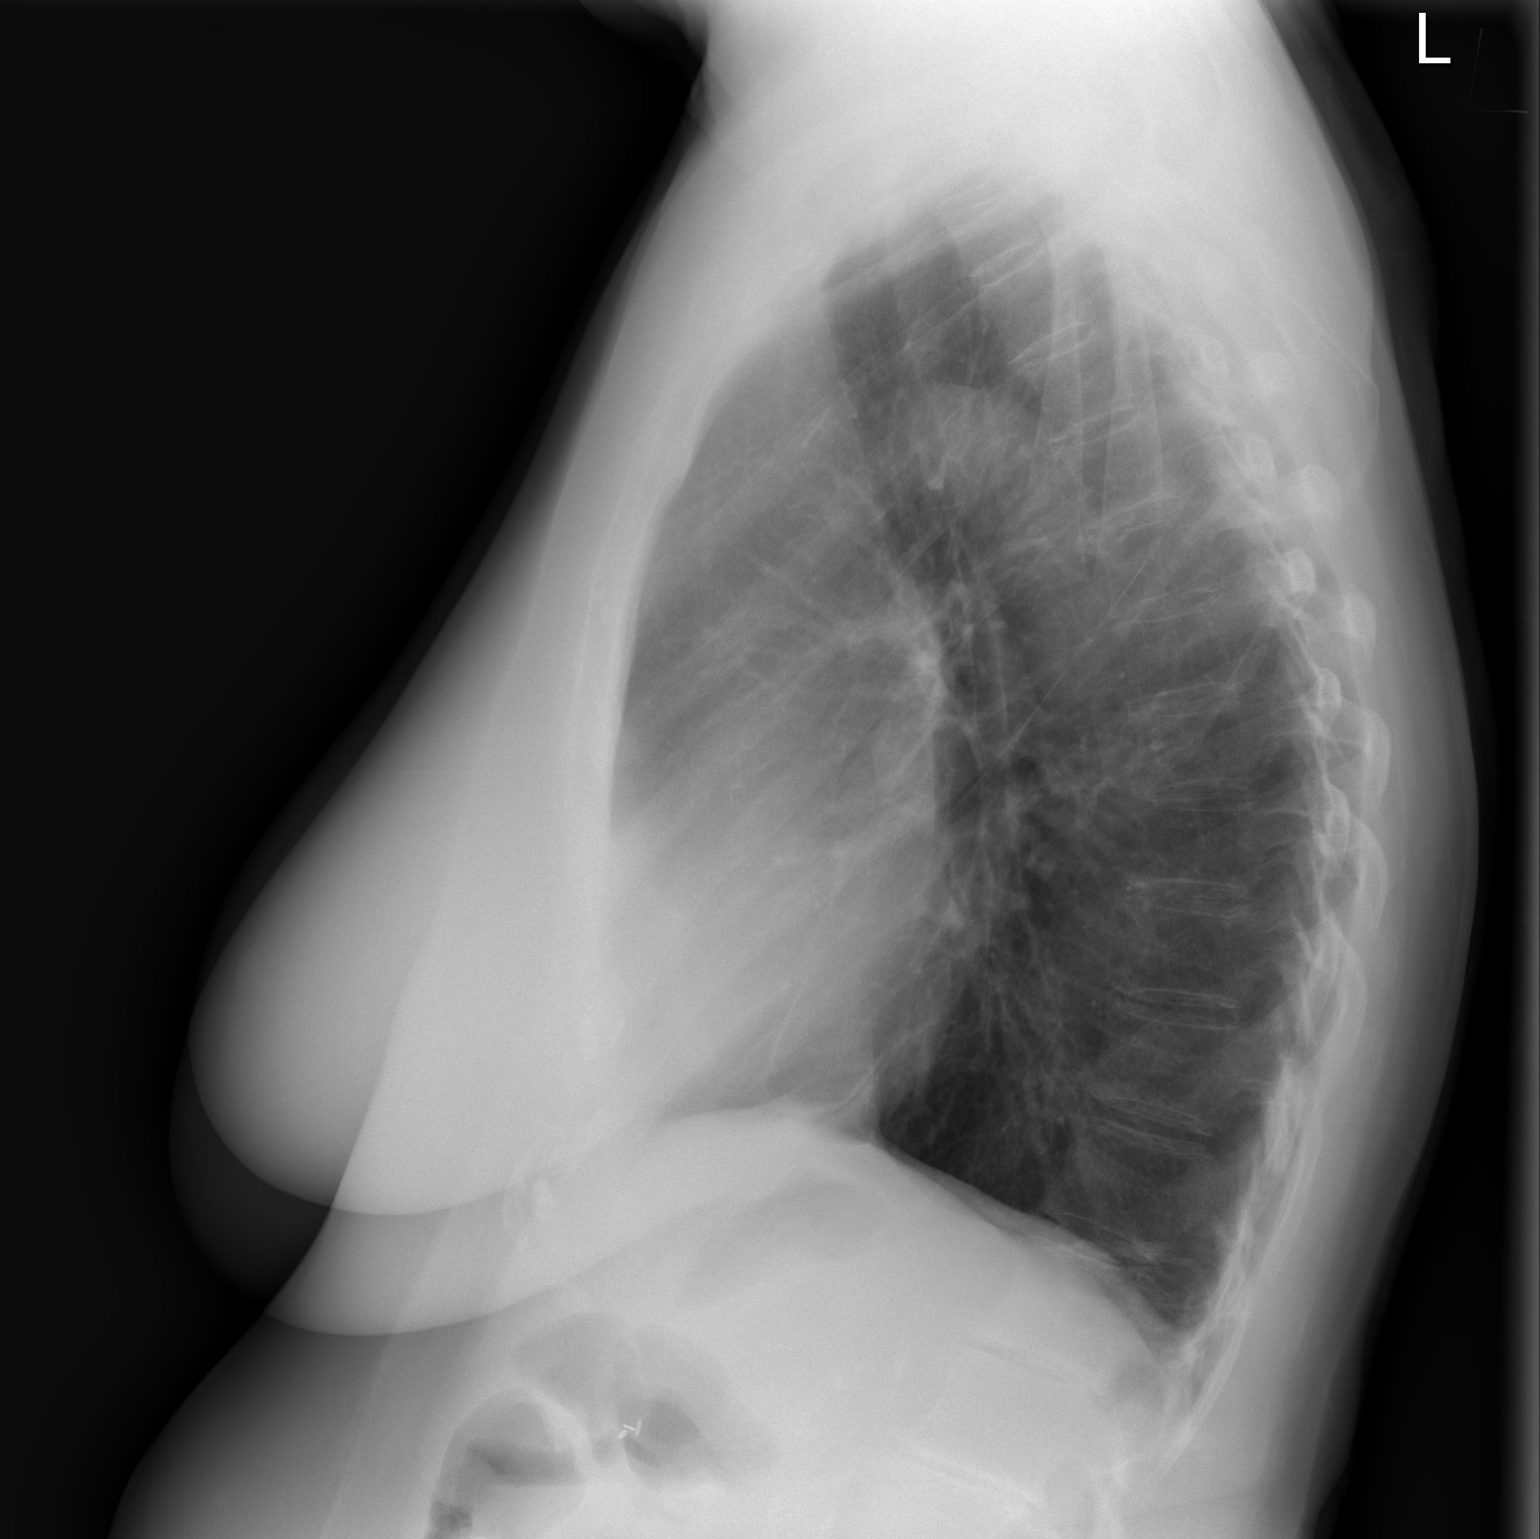

[2 of 2 positions shown; findings below may reference images not displayed]

FINDINGS: Cardiac silhouette is normal size.  There is opacity seen
in the right cardiophrenic angle region with loss of definition of
the margin of the right side of the cardiac silhouette.  There is
minimal density in the left cardiophrenic angle with slight loss of
definition of the cardiac margin inferiorly on the left on the PA
image.  On the lateral image no infiltrative densities are seen
within the lingula or the right middle lobe.  The opacities in the
cardiophrenic angle regions may reflect atelectasis right greater
than left or epicardial fat pads.  The similar appearance is seen
on the scout image of the previous CT of 12/06/2012.

There is a low-lying diaphragm with slight flattening on lateral
image suggesting mild hyperinflation configuration.  No pulmonary
masses are seen.  No consolidation or pleural effusion is evident.
There is slight aortic ectasia.  There is osteopenic appearance of
the bones.  Changes of degenerative disc disease and degenerative
spondylosis are seen.  Cholecystectomy clips are present.
IMPRESSION: The opacities in the cardiophrenic angle regions may reflect
atelectasis right greater than left or epicardial fat pads.  The
similar appearance is seen on the scout image of the previous CT of
12/06/2012.

Low-lying diaphragm with slight flattening of diaphragm on lateral
image suggests mild hyperinflation.  No pulmonary edema or pleural
effusion is seen.  Chronic findings detailed above.

## 2014-10-16 ENCOUNTER — Encounter: Payer: Self-pay | Admitting: *Deleted

## 2014-10-16 ENCOUNTER — Ambulatory Visit (INDEPENDENT_AMBULATORY_CARE_PROVIDER_SITE_OTHER): Payer: Medicare Other | Admitting: *Deleted

## 2014-10-16 VITALS — BP 114/54 | HR 62 | Ht 68.0 in | Wt 194.0 lb

## 2014-10-16 DIAGNOSIS — Z0000 Encounter for general adult medical examination without abnormal findings: Secondary | ICD-10-CM

## 2014-10-16 NOTE — Patient Instructions (Signed)
Information on advanced directives can be found at [email or web address]  You received a pneumonia booster shot today  Please make sure to call and schedule your mammogram   Thank you for coming in today for your annual Medicare Wellness Visit!  Preventive Care for Adults A healthy lifestyle and preventive care can promote health and wellness. Preventive health guidelines for women include the following key practices.  A routine yearly physical is a good way to check with your health care provider about your health and preventive screening. It is a chance to share any concerns and updates on your health and to receive a thorough exam.  Visit your dentist for a routine exam and preventive care every 6 months. Brush your teeth twice a day and floss once a day. Good oral hygiene prevents tooth decay and gum disease.  The frequency of eye exams is based on your age, health, family medical history, use of contact lenses, and other factors. Follow your health care provider's recommendations for frequency of eye exams.  Eat a healthy diet. Foods like vegetables, fruits, whole grains, low-fat dairy products, and lean protein foods contain the nutrients you need without too many calories. Decrease your intake of foods high in solid fats, added sugars, and salt. Eat the right amount of calories for you.Get information about a proper diet from your health care provider, if necessary.  Regular physical exercise is one of the most important things you can do for your health. Most adults should get at least 150 minutes of moderate-intensity exercise (any activity that increases your heart rate and causes you to sweat) each week. In addition, most adults need muscle-strengthening exercises on 2 or more days a week.  Maintain a healthy weight. The body mass index (BMI) is a screening tool to identify possible weight problems. It provides an estimate of body fat based on height and weight. Your health care  provider can find your BMI and can help you achieve or maintain a healthy weight.For adults 20 years and older:  A BMI below 18.5 is considered underweight.  A BMI of 18.5 to 24.9 is normal.  A BMI of 25 to 29.9 is considered overweight.  A BMI of 30 and above is considered obese.  Maintain normal blood lipids and cholesterol levels by exercising and minimizing your intake of saturated fat. Eat a balanced diet with plenty of fruit and vegetables. Blood tests for lipids and cholesterol should begin at age 84 and be repeated every 5 years. If your lipid or cholesterol levels are high, you are over 50, or you are at high risk for heart disease, you may need your cholesterol levels checked more frequently.Ongoing high lipid and cholesterol levels should be treated with medicines if diet and exercise are not working.  If you smoke, find out from your health care provider how to quit. If you do not use tobacco, do not start.  Lung cancer screening is recommended for adults aged 51-80 years who are at high risk for developing lung cancer because of a history of smoking. A yearly low-dose CT scan of the lungs is recommended for people who have at least a 30-pack-year history of smoking and are a current smoker or have quit within the past 15 years. A pack year of smoking is smoking an average of 1 pack of cigarettes a day for 1 year (for example: 1 pack a day for 30 years or 2 packs a day for 15 years). Yearly screening should continue until the smoker  has stopped smoking for at least 15 years. Yearly screening should be stopped for people who develop a health problem that would prevent them from having lung cancer treatment.  If you are pregnant, do not drink alcohol. If you are breastfeeding, be very cautious about drinking alcohol. If you are not pregnant and choose to drink alcohol, do not have more than 1 drink per day. One drink is considered to be 12 ounces (355 mL) of beer, 5 ounces (148 mL) of  wine, or 1.5 ounces (44 mL) of liquor.  Avoid use of street drugs. Do not share needles with anyone. Ask for help if you need support or instructions about stopping the use of drugs.  High blood pressure causes heart disease and increases the risk of stroke. Your blood pressure should be checked at least every 1 to 2 years. Ongoing high blood pressure should be treated with medicines if weight loss and exercise do not work.  If you are 57-45 years old, ask your health care provider if you should take aspirin to prevent strokes.  Diabetes screening involves taking a blood sample to check your fasting blood sugar level. This should be done once every 3 years, after age 35, if you are within normal weight and without risk factors for diabetes. Testing should be considered at a younger age or be carried out more frequently if you are overweight and have at least 1 risk factor for diabetes.  Breast cancer screening is essential preventive care for women. You should practice "breast self-awareness." This means understanding the normal appearance and feel of your breasts and may include breast self-examination. Any changes detected, no matter how small, should be reported to a health care provider. Women in their 2s and 30s should have a clinical breast exam (CBE) by a health care provider as part of a regular health exam every 1 to 3 years. After age 88, women should have a CBE every year. Starting at age 21, women should consider having a mammogram (breast X-ray test) every year. Women who have a family history of breast cancer should talk to their health care provider about genetic screening. Women at a high risk of breast cancer should talk to their health care providers about having an MRI and a mammogram every year.  Breast cancer gene (BRCA)-related cancer risk assessment is recommended for women who have family members with BRCA-related cancers. BRCA-related cancers include breast, ovarian, tubal, and  peritoneal cancers. Having family members with these cancers may be associated with an increased risk for harmful changes (mutations) in the breast cancer genes BRCA1 and BRCA2. Results of the assessment will determine the need for genetic counseling and BRCA1 and BRCA2 testing.  Routine pelvic exams to screen for cancer are no longer recommended for nonpregnant women who are considered low risk for cancer of the pelvic organs (ovaries, uterus, and vagina) and who do not have symptoms. Ask your health care provider if a screening pelvic exam is right for you.  If you have had past treatment for cervical cancer or a condition that could lead to cancer, you need Pap tests and screening for cancer for at least 20 years after your treatment. If Pap tests have been discontinued, your risk factors (such as having a new sexual partner) need to be reassessed to determine if screening should be resumed. Some women have medical problems that increase the chance of getting cervical cancer. In these cases, your health care provider may recommend more frequent screening and Pap  tests.  The HPV test is an additional test that may be used for cervical cancer screening. The HPV test looks for the virus that can cause the cell changes on the cervix. The cells collected during the Pap test can be tested for HPV. The HPV test could be used to screen women aged 68 years and older, and should be used in women of any age who have unclear Pap test results. After the age of 55, women should have HPV testing at the same frequency as a Pap test.  Colorectal cancer can be detected and often prevented. Most routine colorectal cancer screening begins at the age of 19 years and continues through age 70 years. However, your health care provider may recommend screening at an earlier age if you have risk factors for colon cancer. On a yearly basis, your health care provider may provide home test kits to check for hidden blood in the stool.  Use of a small camera at the end of a tube, to directly examine the colon (sigmoidoscopy or colonoscopy), can detect the earliest forms of colorectal cancer. Talk to your health care provider about this at age 29, when routine screening begins. Direct exam of the colon should be repeated every 5-10 years through age 73 years, unless early forms of pre-cancerous polyps or small growths are found.  People who are at an increased risk for hepatitis B should be screened for this virus. You are considered at high risk for hepatitis B if:  You were born in a country where hepatitis B occurs often. Talk with your health care provider about which countries are considered high risk.  Your parents were born in a high-risk country and you have not received a shot to protect against hepatitis B (hepatitis B vaccine).  You have HIV or AIDS.  You use needles to inject street drugs.  You live with, or have sex with, someone who has hepatitis B.  You get hemodialysis treatment.  You take certain medicines for conditions like cancer, organ transplantation, and autoimmune conditions.  Hepatitis C blood testing is recommended for all people born from [birth year] through 1965 and any individual with known risks for hepatitis C.  Practice safe sex. Use condoms and avoid high-risk sexual practices to reduce the spread of sexually transmitted infections (STIs). STIs include gonorrhea, chlamydia, syphilis, trichomonas, herpes, HPV, and human immunodeficiency virus (HIV). Herpes, HIV, and HPV are viral illnesses that have no cure. They can result in disability, cancer, and death.  You should be screened for sexually transmitted illnesses (STIs) including gonorrhea and chlamydia if:  You are sexually active and are younger than 24 years.  You are older than 24 years and your health care provider tells you that you are at risk for this type of infection.  Your sexual activity has changed since you were last screened and  you are at an increased risk for chlamydia or gonorrhea. Ask your health care provider if you are at risk.  If you are at risk of being infected with HIV, it is recommended that you take a prescription medicine daily to prevent HIV infection. This is called preexposure prophylaxis (PrEP). You are considered at risk if:  You are a heterosexual woman, are sexually active, and are at increased risk for HIV infection.  You take drugs by injection.  You are sexually active with a partner who has HIV.  Talk with your health care provider about whether you are at high risk of being infected with HIV.  If you choose to begin PrEP, you should first be tested for HIV. You should then be tested every 3 months for as long as you are taking PrEP.  Osteoporosis is a disease in which the bones lose minerals and strength with aging. This can result in serious bone fractures or breaks. The risk of osteoporosis can be identified using a bone density scan. Women ages 32 years and over and women at risk for fractures or osteoporosis should discuss screening with their health care providers. Ask your health care provider whether you should take a calcium supplement or vitamin D to reduce the rate of osteoporosis.  Menopause can be associated with physical symptoms and risks. Hormone replacement therapy is available to decrease symptoms and risks. You should talk to your health care provider about whether hormone replacement therapy is right for you.  Use sunscreen. Apply sunscreen liberally and repeatedly throughout the day. You should seek shade when your shadow is shorter than you. Protect yourself by wearing long sleeves, pants, a wide-brimmed hat, and sunglasses year round, whenever you are outdoors.  Once a month, do a whole body skin exam, using a mirror to look at the skin on your back. Tell your health care provider of new moles, moles that have irregular borders, moles that are larger than a pencil eraser, or  moles that have changed in shape or color.  Stay current with required vaccines (immunizations).  Influenza vaccine. All adults should be immunized every year.  Tetanus, diphtheria, and acellular pertussis (Td, Tdap) vaccine. Pregnant women should receive 1 dose of Tdap vaccine during each pregnancy. The dose should be obtained regardless of the length of time since the last dose. Immunization is preferred during the 27th-36th week of gestation. An adult who has not previously received Tdap or who does not know her vaccine status should receive 1 dose of Tdap. This initial dose should be followed by tetanus and diphtheria toxoids (Td) booster doses every 10 years. Adults with an unknown or incomplete history of completing a 3-dose immunization series with Td-containing vaccines should begin or complete a primary immunization series including a Tdap dose. Adults should receive a Td booster every 10 years.  Varicella vaccine. An adult without evidence of immunity to varicella should receive 2 doses or a second dose if she has previously received 1 dose. Pregnant females who do not have evidence of immunity should receive the first dose after pregnancy. This first dose should be obtained before leaving the health care facility. The second dose should be obtained 4-8 weeks after the first dose.  Human papillomavirus (HPV) vaccine. Females aged 13-26 years who have not received the vaccine previously should obtain the 3-dose series. The vaccine is not recommended for use in pregnant females. However, pregnancy testing is not needed before receiving a dose. If a female is found to be pregnant after receiving a dose, no treatment is needed. In that case, the remaining doses should be delayed until after the pregnancy. Immunization is recommended for any person with an immunocompromised condition through the age of 60 years if she did not get any or all doses earlier. During the 3-dose series, the second dose  should be obtained 4-8 weeks after the first dose. The third dose should be obtained 24 weeks after the first dose and 16 weeks after the second dose.  Zoster vaccine. One dose is recommended for adults aged 53 years or older unless certain conditions are present.  Measles, mumps, and rubella (MMR) vaccine. Adults  born before [birth year] generally are considered immune to measles and mumps. Adults born in [birth year] or later should have 1 or more doses of MMR vaccine unless there is a contraindication to the vaccine or there is laboratory evidence of immunity to each of the three diseases. A routine second dose of MMR vaccine should be obtained at least 28 days after the first dose for students attending postsecondary schools, health care workers, or international travelers. People who received inactivated measles vaccine or an unknown type of measles vaccine during 1963-1967 should receive 2 doses of MMR vaccine. People who received inactivated mumps vaccine or an unknown type of mumps vaccine before 1979 and are at high risk for mumps infection should consider immunization with 2 doses of MMR vaccine. For females of childbearing age, rubella immunity should be determined. If there is no evidence of immunity, females who are not pregnant should be vaccinated. If there is no evidence of immunity, females who are pregnant should delay immunization until after pregnancy. Unvaccinated health care workers born before [birth year] who lack laboratory evidence of measles, mumps, or rubella immunity or laboratory confirmation of disease should consider measles and mumps immunization with 2 doses of MMR vaccine or rubella immunization with 1 dose of MMR vaccine.  Pneumococcal 13-valent conjugate (PCV13) vaccine. When indicated, a person who is uncertain of her immunization history and has no record of immunization should receive the PCV13 vaccine. An adult aged 51 years or older who has certain medical conditions and has not been  previously immunized should receive 1 dose of PCV13 vaccine. This PCV13 should be followed with a dose of pneumococcal polysaccharide (PPSV23) vaccine. The PPSV23 vaccine dose should be obtained at least 8 weeks after the dose of PCV13 vaccine. An adult aged 71 years or older who has certain medical conditions and previously received 1 or more doses of PPSV23 vaccine should receive 1 dose of PCV13. The PCV13 vaccine dose should be obtained 1 or more years after the last PPSV23 vaccine dose.  Pneumococcal polysaccharide (PPSV23) vaccine. When PCV13 is also indicated, PCV13 should be obtained first. All adults aged 24 years and older should be immunized. An adult younger than age 36 years who has certain medical conditions should be immunized. Any person who resides in a nursing home or long-term care facility should be immunized. An adult smoker should be immunized. People with an immunocompromised condition and certain other conditions should receive both PCV13 and PPSV23 vaccines. People with human immunodeficiency virus (HIV) infection should be immunized as soon as possible after diagnosis. Immunization during chemotherapy or radiation therapy should be avoided. Routine use of PPSV23 vaccine is not recommended for American Indians, [place] Natives, or people younger than 65 years unless there are medical conditions that require PPSV23 vaccine. When indicated, people who have unknown immunization and have no record of immunization should receive PPSV23 vaccine. One-time revaccination 5 years after the first dose of PPSV23 is recommended for people aged 19-64 years who have chronic kidney failure, nephrotic syndrome, asplenia, or immunocompromised conditions. People who received 1-2 doses of PPSV23 before age 86 years should receive another dose of PPSV23 vaccine at age 66 years or later if at least 5 years have passed since the previous dose. Doses of PPSV23 are not needed for people immunized with PPSV23 at or  after age 80 years.  Meningococcal vaccine. Adults with asplenia or persistent complement component deficiencies should receive 2 doses of quadrivalent meningococcal conjugate (MenACWY-D) vaccine. The doses should be obtained at least  2 months apart. Microbiologists working with certain meningococcal bacteria, [place] recruits, people at risk during an outbreak, and people who travel to or live in countries with a high rate of meningitis should be immunized. A first-year college student up through age 77 years who is living in a residence hall should receive a dose if she did not receive a dose on or after her 16th birthday. Adults who have certain high-risk conditions should receive one or more doses of vaccine.  Hepatitis A vaccine. Adults who wish to be protected from this disease, have certain high-risk conditions, work with hepatitis A-infected animals, work in hepatitis A research labs, or travel to or work in countries with a high rate of hepatitis A should be immunized. Adults who were previously unvaccinated and who anticipate close contact with an international adoptee during the first 60 days after arrival in the [place] from a country with a high rate of hepatitis A should be immunized.  Hepatitis B vaccine. Adults who wish to be protected from this disease, have certain high-risk conditions, may be exposed to blood or other infectious body fluids, are household contacts or sex partners of hepatitis B positive people, are clients or workers in certain care facilities, or travel to or work in countries with a high rate of hepatitis B should be immunized.  Haemophilus influenzae type b (Hib) vaccine. A previously unvaccinated person with asplenia or sickle cell disease or having a scheduled splenectomy should receive 1 dose of Hib vaccine. Regardless of previous immunization, a recipient of a hematopoietic stem cell transplant should receive a 3-dose series 6-12 months after her successful  transplant. Hib vaccine is not recommended for adults with HIV infection. Preventive Services / Frequency Ages 63 to 90 years  Blood pressure check.** / Every 1 to 2 years.  Lipid and cholesterol check.** / Every 5 years beginning at age 22.  Clinical breast exam.** / Every 3 years for women in their 63s and 42s.  BRCA-related cancer risk assessment.** / For women who have family members with a BRCA-related cancer (breast, ovarian, tubal, or peritoneal cancers).  Pap test.** / Every 2 years from ages 75 through 83. Every 3 years starting at age 61 through age 20 or 54 with a history of 3 consecutive normal Pap tests.  HPV screening.** / Every 3 years from ages 36 through ages 67 to 72 with a history of 3 consecutive normal Pap tests.  Hepatitis C blood test.** / For any individual with known risks for hepatitis C.  Skin self-exam. / Monthly.  Influenza vaccine. / Every year.  Tetanus, diphtheria, and acellular pertussis (Tdap, Td) vaccine.** / Consult your health care provider. Pregnant women should receive 1 dose of Tdap vaccine during each pregnancy. 1 dose of Td every 10 years.  Varicella vaccine.** / Consult your health care provider. Pregnant females who do not have evidence of immunity should receive the first dose after pregnancy.  HPV vaccine. / 3 doses over 6 months, if 36 and younger. The vaccine is not recommended for use in pregnant females. However, pregnancy testing is not needed before receiving a dose.  Measles, mumps, rubella (MMR) vaccine.** / You need at least 1 dose of MMR if you were born in 1957 or later. You may also need a 2nd dose. For females of childbearing age, rubella immunity should be determined. If there is no evidence of immunity, females who are not pregnant should be vaccinated. If there is no evidence of immunity, females who are  pregnant should delay immunization until after pregnancy.  Pneumococcal 13-valent conjugate (PCV13) vaccine.** / Consult  your health care provider.  Pneumococcal polysaccharide (PPSV23) vaccine.** / 1 to 2 doses if you smoke cigarettes or if you have certain conditions.  Meningococcal vaccine.** / 1 dose if you are age 72 to 85 years and a Market researcher living in a residence hall, or have one of several medical conditions, you need to get vaccinated against meningococcal disease. You may also need additional booster doses.  Hepatitis A vaccine.** / Consult your health care provider.  Hepatitis B vaccine.** / Consult your health care provider.  Haemophilus influenzae type b (Hib) vaccine.** / Consult your health care provider. Ages 64 to 3 years  Blood pressure check.** / Every 1 to 2 years.  Lipid and cholesterol check.** / Every 5 years beginning at age 74 years.  Lung cancer screening. / Every year if you are aged 87-80 years and have a 30-pack-year history of smoking and currently smoke or have quit within the past 15 years. Yearly screening is stopped once you have quit smoking for at least 15 years or develop a health problem that would prevent you from having lung cancer treatment.  Clinical breast exam.** / Every year after age 64 years.  BRCA-related cancer risk assessment.** / For women who have family members with a BRCA-related cancer (breast, ovarian, tubal, or peritoneal cancers).  Mammogram.** / Every year beginning at age 56 years and continuing for as long as you are in good health. Consult with your health care provider.  Pap test.** / Every 3 years starting at age 2 years through age 37 or 1 years with a history of 3 consecutive normal Pap tests.  HPV screening.** / Every 3 years from ages 54 years through ages 52 to 84 years with a history of 3 consecutive normal Pap tests.  Fecal occult blood test (FOBT) of stool. / Every year beginning at age 20 years and continuing until age 37 years. You may not need to do this test if you get a colonoscopy every 10  years.  Flexible sigmoidoscopy or colonoscopy.** / Every 5 years for a flexible sigmoidoscopy or every 10 years for a colonoscopy beginning at age 39 years and continuing until age 66 years.  Hepatitis C blood test.** / For all people born from [birth year] through 1965 and any individual with known risks for hepatitis C.  Skin self-exam. / Monthly.  Influenza vaccine. / Every year.  Tetanus, diphtheria, and acellular pertussis (Tdap/Td) vaccine.** / Consult your health care provider. Pregnant women should receive 1 dose of Tdap vaccine during each pregnancy. 1 dose of Td every 10 years.  Varicella vaccine.** / Consult your health care provider. Pregnant females who do not have evidence of immunity should receive the first dose after pregnancy.  Zoster vaccine.** / 1 dose for adults aged 51 years or older.  Measles, mumps, rubella (MMR) vaccine.** / You need at least 1 dose of MMR if you were born in 1957 or later. You may also need a 2nd dose. For females of childbearing age, rubella immunity should be determined. If there is no evidence of immunity, females who are not pregnant should be vaccinated. If there is no evidence of immunity, females who are pregnant should delay immunization until after pregnancy.  Pneumococcal 13-valent conjugate (PCV13) vaccine.** / Consult your health care provider.  Pneumococcal polysaccharide (PPSV23) vaccine.** / 1 to 2 doses if you smoke cigarettes or if you have certain conditions.  Meningococcal vaccine.** / Consult your health care provider.  Hepatitis A vaccine.** / Consult your health care provider.  Hepatitis B vaccine.** / Consult your health care provider.  Haemophilus influenzae type b (Hib) vaccine.** / Consult your health care provider. Ages 53 years and over  Blood pressure check.** / Every 1 to 2 years.  Lipid and cholesterol check.** / Every 5 years beginning at age 37 years.  Lung cancer screening. / Every year if you are aged 10-80 years  and have a 30-pack-year history of smoking and currently smoke or have quit within the past 15 years. Yearly screening is stopped once you have quit smoking for at least 15 years or develop a health problem that would prevent you from having lung cancer treatment.  Clinical breast exam.** / Every year after age 7 years.  BRCA-related cancer risk assessment.** / For women who have family members with a BRCA-related cancer (breast, ovarian, tubal, or peritoneal cancers).  Mammogram.** / Every year beginning at age 70 years and continuing for as long as you are in good health. Consult with your health care provider.  Pap test.** / Every 3 years starting at age 100 years through age 15 or 5 years with 3 consecutive normal Pap tests. Testing can be stopped between 65 and 70 years with 3 consecutive normal Pap tests and no abnormal Pap or HPV tests in the past 10 years.  HPV screening.** / Every 3 years from ages 55 years through ages 38 or 38 years with a history of 3 consecutive normal Pap tests. Testing can be stopped between 65 and 70 years with 3 consecutive normal Pap tests and no abnormal Pap or HPV tests in the past 10 years.  Fecal occult blood test (FOBT) of stool. / Every year beginning at age 26 years and continuing until age 44 years. You may not need to do this test if you get a colonoscopy every 10 years.  Flexible sigmoidoscopy or colonoscopy.** / Every 5 years for a flexible sigmoidoscopy or every 10 years for a colonoscopy beginning at age 97 years and continuing until age 107 years.  Hepatitis C blood test.** / For all people born from [birth year] through 1963 and any individual with known risks for hepatitis C.  Osteoporosis screening.** / A one-time screening for women ages 84 years and over and women at risk for fractures or osteoporosis.  Skin self-exam. / Monthly.  Influenza vaccine. / Every year.  Tetanus, diphtheria, and acellular pertussis (Tdap/Td) vaccine.** / 1 dose of Td  every 10 years.  Varicella vaccine.** / Consult your health care provider.  Zoster vaccine.** / 1 dose for adults aged 53 years or older.  Pneumococcal 13-valent conjugate (PCV13) vaccine.** / Consult your health care provider.  Pneumococcal polysaccharide (PPSV23) vaccine.** / 1 dose for all adults aged 56 years and older.  Meningococcal vaccine.** / Consult your health care provider.  Hepatitis A vaccine.** / Consult your health care provider.  Hepatitis B vaccine.** / Consult your health care provider.  Haemophilus influenzae type b (Hib) vaccine.** / Consult your health care provider. ** Family history and personal history of risk and conditions may change your health care provider's recommendations. Document Released: 08/10/2001 Document Revised: 10/29/2013 Document Reviewed: 11/09/2010 [facility] Patient Information 2015 [place], [place]. This information is not intended to replace advice given to you by your health care provider. Make sure you discuss any questions you have with your health care provider.  Fall Prevention and Home Safety Falls cause injuries and can  affect all age groups. It is possible to use preventive measures to significantly decrease the likelihood of falls. There are many simple measures which can make your home safer and prevent falls. OUTDOORS  Repair cracks and edges of walkways and driveways.  Remove high doorway thresholds.  Trim shrubbery on the main path into your home.  Have good outside lighting.  Clear walkways of tools, rocks, debris, and clutter.  Check that handrails are not broken and are securely fastened. Both sides of steps should have handrails.  Have leaves, snow, and ice cleared regularly.  Use sand or salt on walkways during winter months.  In the garage, clean up grease or oil spills. BATHROOM  Install night lights.  Install grab bars by the toilet and in the tub and shower.  Use non-skid mats or decals in the tub or  shower.  Place a plastic non-slip stool in the shower to sit on, if needed.  Keep floors dry and clean up all water on the floor immediately.  Remove soap buildup in the tub or shower on a regular basis.  Secure bath mats with non-slip, double-sided rug tape.  Remove throw rugs and tripping hazards from the floors. BEDROOMS  Install night lights.  Make sure a bedside light is easy to reach.  Do not use oversized bedding.  Keep a telephone by your bedside.  Have a firm chair with side arms to use for getting dressed.  Remove throw rugs and tripping hazards from the floor. KITCHEN  Keep handles on pots and pans turned toward the center of the stove. Use back burners when possible.  Clean up spills quickly and allow time for drying.  Avoid walking on wet floors.  Avoid hot utensils and knives.  Position shelves so they are not too high or low.  Place commonly used objects within easy reach.  If necessary, use a sturdy step stool with a grab bar when reaching.  Keep electrical cables out of the way.  Do not use floor polish or wax that makes floors slippery. If you must use wax, use non-skid floor wax.  Remove throw rugs and tripping hazards from the floor. STAIRWAYS  Never leave objects on stairs.  Place handrails on both sides of stairways and use them. Fix any loose handrails. Make sure handrails on both sides of the stairways are as long as the stairs.  Check carpeting to make sure it is firmly attached along stairs. Make repairs to worn or loose carpet promptly.  Avoid placing throw rugs at the top or bottom of stairways, or properly secure the rug with carpet tape to prevent slippage. Get rid of throw rugs, if possible.  Have an electrician put in a light switch at the top and bottom of the stairs. OTHER FALL PREVENTION TIPS  Wear low-heel or rubber-soled shoes that are supportive and fit well. Wear closed toe shoes.  When using a stepladder, make sure it  is fully opened and both spreaders are firmly locked. Do not climb a closed stepladder.  Add color or contrast paint or tape to grab bars and handrails in your home. Place contrasting color strips on first and last steps.  Learn and use mobility aids as needed. Install an electrical emergency response system.  Turn on lights to avoid dark areas. Replace light bulbs that burn out immediately. Get light switches that glow.  Arrange furniture to create clear pathways. Keep furniture in the same place.  Firmly attach carpet with non-skid or double-sided tape.  Eliminate uneven floor surfaces.  Select a carpet pattern that does not visually hide the edge of steps.  Be aware of all pets. OTHER HOME SAFETY TIPS  Set the water temperature for 120 F (48.8 C).  Keep emergency numbers on or near the telephone.  Keep smoke detectors on every level of the home and near sleeping areas. Document Released: 06/04/2002 Document Revised: 12/14/2011 Document Reviewed: 09/03/2011 [facility] Patient Information 2015 [place], [place]. This information is not intended to replace advice given to you by your health care provider. Make sure you discuss any questions you have with your health care provider.

## 2014-10-17 ENCOUNTER — Encounter: Payer: Self-pay | Admitting: *Deleted

## 2014-10-17 NOTE — Progress Notes (Signed)
Subjective:   Brenda Solis is a 80 y.o. female who presents for an Initial Medicare Annual Wellness Visit.  She complains of leg and back pain, and uses the assistance of a cane for walkin.  She states her back and leg pain is chornic.  She is in the process of seeing Dr. Gladstone Lighter and one of his associates to schedule surgery- lumbar disc fusion - per patient.  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  Her husband is deceased, all of her children within walking distance of her home.    Review of Systems      Cardiac Risk Factors include: advanced age (>61men, >70 women);dyslipidemia;hypertension;sedentary lifestyle     Objective:    Today's Vitals   10/16/14 1013  Height: 5\' 8"  (1.727 m)  Weight: 194 lb (87.998 kg)  PainSc: 4     Current Medications (verified) Outpatient Encounter Prescriptions as of 10/16/2014  Medication Sig  . ALPRAZolam (XANAX) 0.25 MG tablet TAKE ONE TABLET BY MOUTH AT BEDTIME AS NEEDED  . cholecalciferol (VITAMIN D) 1000 UNITS tablet Take 2,000 Units by mouth daily.    . citalopram (CELEXA) 40 MG tablet TAKE ONE TABLET BY MOUTH ONE TIME DAILY  . levothyroxine (SYNTHROID, LEVOTHROID) 25 MCG tablet Take 1 tablet (25 mcg total) by mouth daily before breakfast.  . meclizine (ANTIVERT) 25 MG tablet TAKE ONE TABLET BY MOUTH THREE TIMES DAILY AS NEEDED FOR DIZZINESS  . meloxicam (MOBIC) 15 MG tablet Take 15 mg by mouth daily as needed for pain.  . metoprolol succinate (TOPROL-XL) 25 MG 24 hr tablet Take 1 tablet (25 mg total) by mouth every morning.  . risperiDONE (RISPERDAL) 1 MG tablet Take 0.5 tablets (0.5 mg total) by mouth at bedtime.  . traMADol (ULTRAM) 50 MG tablet Take 50 mg by mouth every 6 (six) hours as needed.  . zolpidem (AMBIEN) 10 MG tablet Take 1 tablet (10 mg total) by mouth at bedtime.  Marland Kitchen atorvastatin (LIPITOR) 40 MG tablet TAKE ONE TABLET BY MOUTH AT BEDTIME (Patient not taking: Reported on 10/16/2014)    Allergies  (verified) Gabapentin and Oxycodone   History: Past Medical History  Diagnosis Date  . Thyroid disease     hypothyroidism  . Insomnia, unspecified   . Spondylosis of unspecified site without mention of myelopathy   . Hypertension   . Hyperlipidemia   . Depression    Past Surgical History  Procedure Laterality Date  . Total hip arthroplasty  2011    RIGHT  . Cataract extraction, bilateral Bilateral 2006  . Knee surgery Right 2009    meniscus repair  . Cervical disc surgery  1999  . Cesarean section  1962 and 1967  . Cholecystectomy  1990  . Decompressive lumbar laminectomy level 1 N/A 02/01/2013    Procedure: CENTRAL DECOMPRESSIVE LUMBAR LAMINECTOMY LEVEL 1 L4-L5;  Surgeon: Tobi Bastos, MD;  Location: [facility];  Service: Orthopedics;  Laterality: N/A;   Family History  Problem Relation Age of Onset  . Stroke      FAMILY  . Esophageal varices      FAMILY  . Coronary artery disease Brother     52s  . Heart disease Brother   . Cancer Mother     breast  . Osteoporosis Mother   . Heart disease Mother   . Osteoporosis Maternal Aunt   . Heart disease Father   . Stroke Father    Social History  Occupational History  .     Social History Main Topics  . Smoking status: Never Smoker   . Smokeless tobacco: Never Used  . Alcohol Use: No  . Drug Use: No  . Sexual Activity: No    Tobacco Counseling Counseling given: Not Answered   Activities of Daily Living In your present state of health, do you have any difficulty performing the following activities: 10/16/2014 06/15/2014  Hearing? N N  Vision? N N  Difficulty concentrating or making decisions? Y N  Walking or climbing stairs? Y N  Dressing or bathing? N N  Doing errands, shopping? N -  Preparing Food and eating ? N -  Using the Toilet? N -  In the past six months, have you accidently leaked urine? N -  Do you have problems with loss of bowel control? N -  Managing your Medications? N -  Managing your  Finances? N -  Housekeeping or managing your Housekeeping? N -    Immunizations and Health Maintenance Immunization History  Administered Date(s) Administered  . Influenza,inj,Quad PF,36+ Mos 03/28/2013, 04/24/2014  . Pneumococcal Conjugate-13 05/28/2006  . Td 06/29/1995   Health Maintenance Due  Topic Date Due  . ZOSTAVAX  12/05/1996  . PNA vac Low Risk Adult (2 of 2 - PPSV23) 05/29/2007    Patient Care Team: Chevis Pretty, FNP as PCP - General (Nurse Practitioner)  Indicate any recent Medical Services you may have received from other than [facility] providers in the past year (date may be approximate).     Assessment:   This is a routine wellness examination for Brenda Solis.   Hearing/Vision screen Patient saw Dr. Hassell Done at [facility] office in [place] 09/15/14- she got a prescription for new glasses.  She has history of bilateral cataract surgery in 2006, negative for glaucoma. States she still hears well, except for when there is significant background noise- she declines a referral to audiology at this point.   Dietary issues and exercise activities discussed: Current Exercise Habits:: Exercise is limited by, Limited by:: orthopedic condition(s);neurologic condition(s) (planning to schedule lumbar fusion )  Goals    None    Increase walking for exercise once spine issues are corrected   Depression Screen PHQ 2/9 Scores 10/16/2014 07/09/2014 02/01/2014 03/05/2013  PHQ - 2 Score 0 0 5 0  PHQ- 9 Score - - 11 -    Fall Risk Fall Risk  10/16/2014 07/09/2014 02/01/2014 03/05/2013  Falls in the past year? No No No No  Risk for fall due to : Impaired mobility - - -       Screening Tests Health Maintenance  Topic Date Due  . ZOSTAVAX  12/05/1996  . PNA vac Low Risk Adult (2 of 2 - PPSV23) 05/29/2007  . INFLUENZA VACCINE  01/27/2015  . COLONOSCOPY  09/26/2017  . TETANUS/TDAP  04/28/2021  . DEXA SCAN  Completed   Declined zostavax today due to cost, she will check back later  in the year.  Prevnar 13 given today.      Plan:       During the course of the visit, Brenda Solis was educated and counseled about the following appropriate screening and preventive services:   Vaccines to include Pneumoccal, Influenza, Td, Zostavax  Colorectal cancer screening - up to date  Bone density screening- up to date  Diabetes screening- up to date  Glaucoma screening- up to date  Mammography/PAP- patient states she has her mammograms done in [place]- she will call to get this  scheduled as mammogram is due now   Patient Instructions (the written plan) were given to the patient.    Zaviyar Rahal M, RN   10/17/2014      I have reviewed and agree with the above AWV documentation.  Claretta Fraise, M.D.

## 2014-10-23 ENCOUNTER — Telehealth: Payer: Self-pay | Admitting: Nurse Practitioner

## 2014-10-23 ENCOUNTER — Encounter: Payer: Self-pay | Admitting: Nurse Practitioner

## 2014-10-23 NOTE — Telephone Encounter (Signed)
Letter ready for pick up

## 2014-10-30 DIAGNOSIS — M4806 Spinal stenosis, lumbar region: Secondary | ICD-10-CM | POA: Diagnosis not present

## 2014-10-30 DIAGNOSIS — M4696 Unspecified inflammatory spondylopathy, lumbar region: Secondary | ICD-10-CM | POA: Diagnosis not present

## 2014-11-04 DIAGNOSIS — M21371 Foot drop, right foot: Secondary | ICD-10-CM | POA: Diagnosis not present

## 2014-11-05 ENCOUNTER — Ambulatory Visit (INDEPENDENT_AMBULATORY_CARE_PROVIDER_SITE_OTHER): Payer: Medicare Other | Admitting: Nurse Practitioner

## 2014-11-05 ENCOUNTER — Encounter: Payer: Self-pay | Admitting: Nurse Practitioner

## 2014-11-05 VITALS — BP 134/59 | HR 56 | Temp 97.7°F | Ht 68.0 in | Wt 191.0 lb

## 2014-11-05 DIAGNOSIS — Z01818 Encounter for other preprocedural examination: Secondary | ICD-10-CM

## 2014-11-05 NOTE — Progress Notes (Signed)
   Subjective:    Patient ID: Brenda Solis, female    DOB: March 31, 1935, 80 y.o.   MRN: [id number]  HPI Patient here today for surgical clearance. She is going to have back surgery due to chronic back pain and scar tissue in back. SHe is having lots of pain.    Review of Systems  Constitutional: Negative.   HENT: Negative.   Respiratory: Negative.   Cardiovascular: Negative.   Gastrointestinal: Negative.   Genitourinary: Negative.   Neurological: Negative.   Psychiatric/Behavioral: Negative.   All other systems reviewed and are negative.      Objective:   Physical Exam  Constitutional: She is oriented to person, place, and time. She appears well-developed and well-nourished.  Cardiovascular: Normal rate, regular rhythm and normal heart sounds.   Pulmonary/Chest: Effort normal and breath sounds normal.  Musculoskeletal:  Low back pain with all movement (+) SLR on right Motor strength and sensation distally intact Gait slow and steady with use of cane  Neurological: She is alert and oriented to person, place, and time.  Skin: Skin is warm and dry.  Psychiatric: She has a normal mood and affect. Her behavior is normal. Judgment and thought content normal.   BP 134/59 mmHg  Pulse 56  Temp(Src) 97.7 F (36.5 C) (Oral)  Ht 5\' 8"  (1.727 m)  Wt 191 lb (86.637 kg)  BMI 29.05 kg/m2   EKG- sinus Verne Grain, FNP      Assessment & Plan:   1. Preoperative clearance    ekg copy given to patient Labs to be done at hospital RTO prn  [place], [place]

## 2014-11-12 DIAGNOSIS — M4806 Spinal stenosis, lumbar region: Secondary | ICD-10-CM | POA: Diagnosis not present

## 2014-11-15 ENCOUNTER — Encounter (HOSPITAL_COMMUNITY)
Admission: RE | Admit: 2014-11-15 | Discharge: 2014-11-15 | Disposition: A | Discharge status: Home / Self Care | Payer: 59 | Source: Ambulatory Visit | Attending: Orthopedic Surgery | Admitting: Orthopedic Surgery

## 2014-11-15 ENCOUNTER — Encounter (HOSPITAL_COMMUNITY): Payer: Self-pay

## 2014-11-15 DIAGNOSIS — Z01812 Encounter for preprocedural laboratory examination: Secondary | ICD-10-CM | POA: Insufficient documentation

## 2014-11-15 DIAGNOSIS — I10 Essential (primary) hypertension: Secondary | ICD-10-CM | POA: Diagnosis not present

## 2014-11-15 DIAGNOSIS — Z0183 Encounter for blood typing: Secondary | ICD-10-CM | POA: Diagnosis not present

## 2014-11-15 DIAGNOSIS — E785 Hyperlipidemia, unspecified: Secondary | ICD-10-CM | POA: Insufficient documentation

## 2014-11-15 DIAGNOSIS — G4700 Insomnia, unspecified: Secondary | ICD-10-CM | POA: Insufficient documentation

## 2014-11-15 DIAGNOSIS — Z01818 Encounter for other preprocedural examination: Secondary | ICD-10-CM | POA: Insufficient documentation

## 2014-11-15 DIAGNOSIS — Z79899 Other long term (current) drug therapy: Secondary | ICD-10-CM | POA: Diagnosis not present

## 2014-11-15 DIAGNOSIS — E039 Hypothyroidism, unspecified: Secondary | ICD-10-CM | POA: Insufficient documentation

## 2014-11-15 DIAGNOSIS — M4806 Spinal stenosis, lumbar region: Secondary | ICD-10-CM | POA: Diagnosis not present

## 2014-11-15 HISTORY — DX: Cardiac murmur, unspecified: R01.1

## 2014-11-15 HISTORY — DX: Hypothyroidism, unspecified: E03.9

## 2014-11-15 LAB — CBC
HCT: 39 % (ref 36.0–46.0)
Hemoglobin: 13.1 g/dL (ref 12.0–15.0)
MCH: 30 pg (ref 26.0–34.0)
MCHC: 33.6 g/dL (ref 30.0–36.0)
MCV: 89.2 fL (ref 78.0–100.0)
Platelets: 178 10*3/uL (ref 150–400)
RBC: 4.37 MIL/uL (ref 3.87–5.11)
RDW: 13.4 % (ref 11.5–15.5)
WBC: 8.2 10*3/uL (ref 4.0–10.5)

## 2014-11-15 LAB — BASIC METABOLIC PANEL
ANION GAP: 9 (ref 5–15)
BUN: 18 mg/dL (ref 6–20)
CO2: 22 mmol/L (ref 22–32)
CREATININE: 0.95 mg/dL (ref 0.44–1.00)
Calcium: 9.8 mg/dL (ref 8.9–10.3)
Chloride: 106 mmol/L (ref 101–111)
GFR calc Af Amer: >60 mL/min (ref >60)
GFR calc non Af Amer: 56 mL/min — ABNORMAL LOW (ref >60)
Glucose, Bld: 85 mg/dL (ref 65–99)
POTASSIUM: 4.3 mmol/L (ref 3.5–5.1)
Sodium: 137 mmol/L (ref 135–145)

## 2014-11-15 LAB — SURGICAL PCR SCREEN
MRSA, PCR: NEGATIVE
Staphylococcus aureus: NEGATIVE

## 2014-11-15 NOTE — Pre-Procedure Instructions (Addendum)
Brenda Solis  11/15/2014      [place], [place] - [address] [place] [address] Phone: [phone] Fax: [phone]    Your procedure is scheduled on 11/20/14.  [facility] short [facility] admitting at 630 A.M.  Call this number if you have problems the morning of surgery:  [phone]   Remember:  Do not eat food or drink liquids after midnight.  Take these medicines the morning of surgery with A SIP OF WATER xanax,pain med if needed,  Celexa, metropolol    STOP all herbel meds, nsaids (aleve,naproxen,advil,ibuprofen) 5 days prior to surgery starting today including vitamins, aspirin, vit D, meloxicam,aspercreme,vit C    Bring brace   Do not wear jewelry, make-up or nail polish.  Do not wear lotions, powders, or perfumes.  You may wear deodorant.  Do not shave 48 hours prior to surgery.  Men may shave face and neck.  Do not bring valuables to the hospital.  [facility] is not responsible for any belongings or valuables.  Contacts, dentures or bridgework may not be worn into surgery.  Leave your suitcase in the car.  After surgery it may be brought to your room.  For patients admitted to the hospital, discharge time will be determined by your treatment team.  Patients discharged the day of surgery will not be allowed to drive home.   Name and phone number of your driver:    Special instructions:   Special Instructions: [place] - Preparing for Surgery  Before surgery, you can play an important role.  Because skin is not sterile, your skin needs to be as free of germs as possible.  You can reduce the number of germs on you skin by washing with CHG (chlorahexidine gluconate) soap before surgery.  CHG is an antiseptic cleaner which kills germs and bonds with the skin to continue killing germs even after washing.  Please DO NOT use if you have an allergy to CHG or antibacterial soaps.  If your skin becomes reddened/irritated stop  using the CHG and inform your nurse when you arrive at Short Stay.  Do not shave (including legs and underarms) for at least 48 hours prior to the first CHG shower.  You may shave your face.  Please follow these instructions carefully:   1.  Shower with CHG Soap the night before surgery and the morning of Surgery.  2.  If you choose to wash your hair, wash your hair first as usual with your normal shampoo.  3.  After you shampoo, rinse your hair and body thoroughly to remove the Shampoo.  4.  Use CHG as you would any other liquid soap.  You can apply chg directly  to the skin and wash gently with scrungie or a clean washcloth.  5.  Apply the CHG Soap to your body ONLY FROM THE NECK DOWN.  Do not use on open wounds or open sores.  Avoid contact with your eyes ears, mouth and genitals (private parts).  Wash genitals (private parts)       with your normal soap.  6.  Wash thoroughly, paying special attention to the area where your surgery will be performed.  7.  Thoroughly rinse your body with warm water from the neck down.  8.  DO NOT shower/wash with your normal soap after using and rinsing off the CHG Soap.  9.  Pat yourself dry with a clean towel.  10.  Wear clean pajamas.            11.  Place clean sheets on your bed the night of your first shower and do not sleep with pets.  Day of Surgery  Do not apply any lotions/deodorants the morning of surgery.  Please wear clean clothes to the hospital/surgery center.  Please read over the following fact sheets that you were given. Pain Booklet, Coughing and Deep Breathing, Blood Transfusion Information, MRSA Information and Surgical Site Infection Prevention

## 2014-11-18 NOTE — Progress Notes (Signed)
Anesthesia Chart Review:  Patient is a 80 year old female scheduled for revision lumbar decompression and in situ fusion L4-5 on 11/20/14 by Dr. Rolena Infante.  History includes non-smoker, hypothyroidism, insomnia, HTN, depression, HLD, murmur (not specified), right THA '11, cervical and lumbar ('14) surgeries. PCP is with [facility].  Patient was seen there by Mary-Margaret Hassell Done, FNP on 11/05/14 for a pre-operative evaluation (see Epic; message left with Judeen Hammans at Dr. Rolena Infante' office to fax any additional clearance records). She is not routinely followed by cardiology, but saw Dr. Stanford Breed in 2012 after a syncopal episode. Following a stress echo and event monitor, he thought the event was likely a vagal episode.   Meds include Xanax, Celexa, levothyroxine, Toprol XL, tramadol, Ambien.  11/05/14 EKG: SB at 55 bpm, non-specific ST abnormality.  11/30/10 Stress echo: Stress echo results: Normal dobutamine echo. There is no evidence of inducible ischemia.  21 day event monitor in 5/21012 showed SR.  Preoperative labs noted.   Pre-operative testing results appear acceptable for OR. Further evaluation by her assigned anesthesiologist on the day of surgery.  [facility] Short Stay Center/Anesthesiology Phone [phone] 11/18/2014 11:39 AM

## 2014-11-19 MED ORDER — CEFAZOLIN SODIUM-DEXTROSE 2-3 GM-% IV SOLR
2.0000 g | INTRAVENOUS | Status: AC
  Administered 2014-11-20: 2 g via INTRAVENOUS
  Filled 2014-11-19: qty 50

## 2014-11-20 ENCOUNTER — Encounter (HOSPITAL_COMMUNITY)
Admission: RE | Disposition: A | Discharge status: Skilled Nursing Facility | Payer: Self-pay | Source: Ambulatory Visit | Attending: Orthopedic Surgery

## 2014-11-20 ENCOUNTER — Inpatient Hospital Stay (HOSPITAL_COMMUNITY): Payer: Medicare Other | Admitting: Vascular Surgery

## 2014-11-20 ENCOUNTER — Inpatient Hospital Stay (HOSPITAL_COMMUNITY): Payer: Medicare Other | Admitting: Anesthesiology

## 2014-11-20 ENCOUNTER — Encounter (HOSPITAL_COMMUNITY): Payer: Self-pay | Admitting: Surgery

## 2014-11-20 ENCOUNTER — Inpatient Hospital Stay (HOSPITAL_COMMUNITY): Payer: Medicare Other

## 2014-11-20 ENCOUNTER — Inpatient Hospital Stay (HOSPITAL_COMMUNITY)
Admission: RE | Admit: 2014-11-20 | Discharge: 2014-11-23 | DRG: 460 | Disposition: A | Discharge status: Skilled Nursing Facility | Payer: Medicare Other | Source: Ambulatory Visit | Attending: Orthopedic Surgery | Admitting: Orthopedic Surgery

## 2014-11-20 DIAGNOSIS — M47816 Spondylosis without myelopathy or radiculopathy, lumbar region: Secondary | ICD-10-CM | POA: Diagnosis not present

## 2014-11-20 DIAGNOSIS — Z4789 Encounter for other orthopedic aftercare: Secondary | ICD-10-CM | POA: Diagnosis not present

## 2014-11-20 DIAGNOSIS — M21371 Foot drop, right foot: Secondary | ICD-10-CM | POA: Diagnosis not present

## 2014-11-20 DIAGNOSIS — F329 Major depressive disorder, single episode, unspecified: Secondary | ICD-10-CM | POA: Diagnosis present

## 2014-11-20 DIAGNOSIS — E785 Hyperlipidemia, unspecified: Secondary | ICD-10-CM | POA: Diagnosis not present

## 2014-11-20 DIAGNOSIS — M5126 Other intervertebral disc displacement, lumbar region: Secondary | ICD-10-CM | POA: Diagnosis not present

## 2014-11-20 DIAGNOSIS — M179 Osteoarthritis of knee, unspecified: Secondary | ICD-10-CM | POA: Diagnosis present

## 2014-11-20 DIAGNOSIS — Z9889 Other specified postprocedural states: Secondary | ICD-10-CM | POA: Diagnosis not present

## 2014-11-20 DIAGNOSIS — M549 Dorsalgia, unspecified: Secondary | ICD-10-CM | POA: Diagnosis not present

## 2014-11-20 DIAGNOSIS — M4806 Spinal stenosis, lumbar region: Principal | ICD-10-CM | POA: Diagnosis present

## 2014-11-20 DIAGNOSIS — G4700 Insomnia, unspecified: Secondary | ICD-10-CM | POA: Diagnosis present

## 2014-11-20 DIAGNOSIS — E079 Disorder of thyroid, unspecified: Secondary | ICD-10-CM | POA: Diagnosis not present

## 2014-11-20 DIAGNOSIS — M129 Arthropathy, unspecified: Secondary | ICD-10-CM | POA: Diagnosis not present

## 2014-11-20 DIAGNOSIS — M4316 Spondylolisthesis, lumbar region: Secondary | ICD-10-CM | POA: Diagnosis not present

## 2014-11-20 DIAGNOSIS — M4800 Spinal stenosis, site unspecified: Secondary | ICD-10-CM | POA: Diagnosis present

## 2014-11-20 DIAGNOSIS — I10 Essential (primary) hypertension: Secondary | ICD-10-CM | POA: Diagnosis not present

## 2014-11-20 DIAGNOSIS — Z981 Arthrodesis status: Secondary | ICD-10-CM | POA: Diagnosis not present

## 2014-11-20 DIAGNOSIS — F39 Unspecified mood [affective] disorder: Secondary | ICD-10-CM | POA: Diagnosis not present

## 2014-11-20 DIAGNOSIS — E039 Hypothyroidism, unspecified: Secondary | ICD-10-CM | POA: Diagnosis not present

## 2014-11-20 DIAGNOSIS — Z419 Encounter for procedure for purposes other than remedying health state, unspecified: Secondary | ICD-10-CM

## 2014-11-20 HISTORY — PX: DECOMPRESSIVE LUMBAR LAMINECTOMY LEVEL 1: SHX5791

## 2014-11-20 LAB — TYPE AND SCREEN
ABO/RH(D): A POS
ANTIBODY SCREEN: NEGATIVE

## 2014-11-20 SURGERY — DECOMPRESSIVE LUMBAR LAMINECTOMY LEVEL 1
Anesthesia: General | Site: Back

## 2014-11-20 MED ORDER — ALPRAZOLAM 0.25 MG PO TABS: 0.2500 mg | ORAL_TABLET | Freq: Every evening | ORAL | Status: DC | PRN

## 2014-11-20 MED ORDER — FENTANYL CITRATE (PF) 100 MCG/2ML IJ SOLN
INTRAMUSCULAR | Status: AC
  Administered 2014-11-20: 25 ug via INTRAVENOUS
  Filled 2014-11-20: qty 2

## 2014-11-20 MED ORDER — LIDOCAINE HCL (CARDIAC) 20 MG/ML IV SOLN
INTRAVENOUS | Status: AC
  Filled 2014-11-20: qty 5

## 2014-11-20 MED ORDER — 0.9 % SODIUM CHLORIDE (POUR BTL) OPTIME
TOPICAL | Status: DC | PRN
  Administered 2014-11-20: 1000 mL

## 2014-11-20 MED ORDER — GLYCOPYRROLATE 0.2 MG/ML IJ SOLN
INTRAMUSCULAR | Status: DC | PRN
  Administered 2014-11-20: 0.6 mg via INTRAVENOUS
  Administered 2014-11-20: 0.2 mg via INTRAVENOUS

## 2014-11-20 MED ORDER — LACTATED RINGERS IV SOLN
INTRAVENOUS | Status: DC | PRN
  Administered 2014-11-20 (×2): via INTRAVENOUS

## 2014-11-20 MED ORDER — ACETAMINOPHEN 10 MG/ML IV SOLN
1000.0000 mg | Freq: Four times a day (QID) | INTRAVENOUS | Status: AC
  Administered 2014-11-20 – 2014-11-21 (×3): 1000 mg via INTRAVENOUS
  Filled 2014-11-20 (×3): qty 100

## 2014-11-20 MED ORDER — VANCOMYCIN HCL 1000 MG IV SOLR
INTRAVENOUS | Status: DC | PRN
Start: 2014-11-20 — End: 2014-11-20
  Administered 2014-11-20: 1000 mg

## 2014-11-20 MED ORDER — OXYCODONE HCL 5 MG PO TABS: 10.0000 mg | ORAL_TABLET | ORAL | Status: DC | PRN

## 2014-11-20 MED ORDER — THROMBIN 20000 UNITS EX SOLR
CUTANEOUS | Status: AC
  Filled 2014-11-20: qty 20000

## 2014-11-20 MED ORDER — LEVOTHYROXINE SODIUM 25 MCG PO TABS
25.0000 ug | ORAL_TABLET | Freq: Every day | ORAL | Status: DC
  Administered 2014-11-21 – 2014-11-23 (×3): 25 ug via ORAL
  Filled 2014-11-20 (×3): qty 1

## 2014-11-20 MED ORDER — PROMETHAZINE HCL 25 MG/ML IJ SOLN: 6.2500 mg | INTRAMUSCULAR | Status: DC | PRN

## 2014-11-20 MED ORDER — FENTANYL CITRATE (PF) 250 MCG/5ML IJ SOLN
INTRAMUSCULAR | Status: AC
  Filled 2014-11-20: qty 5

## 2014-11-20 MED ORDER — HEMOSTATIC AGENTS (NO CHARGE) OPTIME
TOPICAL | Status: DC | PRN
  Administered 2014-11-20: 1 via TOPICAL

## 2014-11-20 MED ORDER — SUCCINYLCHOLINE CHLORIDE 20 MG/ML IJ SOLN
INTRAMUSCULAR | Status: AC
  Filled 2014-11-20: qty 1

## 2014-11-20 MED ORDER — NEOSTIGMINE METHYLSULFATE 10 MG/10ML IV SOLN
INTRAVENOUS | Status: AC
  Filled 2014-11-20: qty 1

## 2014-11-20 MED ORDER — EPHEDRINE SULFATE 50 MG/ML IJ SOLN
INTRAMUSCULAR | Status: AC
  Filled 2014-11-20: qty 1

## 2014-11-20 MED ORDER — ALBUMIN HUMAN 5 % IV SOLN
INTRAVENOUS | Status: DC | PRN
  Administered 2014-11-20: 12:00:00 via INTRAVENOUS

## 2014-11-20 MED ORDER — CITALOPRAM HYDROBROMIDE 40 MG PO TABS
40.0000 mg | ORAL_TABLET | Freq: Every day | ORAL | Status: DC
  Administered 2014-11-21 – 2014-11-23 (×3): 40 mg via ORAL
  Filled 2014-11-20 (×3): qty 1

## 2014-11-20 MED ORDER — METHOCARBAMOL 1000 MG/10ML IJ SOLN
500.0000 mg | Freq: Four times a day (QID) | INTRAVENOUS | Status: DC | PRN
  Filled 2014-11-20: qty 5

## 2014-11-20 MED ORDER — PHENOL 1.4 % MT LIQD: 1.0000 | OROMUCOSAL | Status: DC | PRN

## 2014-11-20 MED ORDER — BUPIVACAINE-EPINEPHRINE (PF) 0.25% -1:200000 IJ SOLN
INTRAMUSCULAR | Status: AC
  Filled 2014-11-20: qty 30

## 2014-11-20 MED ORDER — EPHEDRINE SULFATE 50 MG/ML IJ SOLN
INTRAMUSCULAR | Status: DC | PRN
  Administered 2014-11-20: 10 mg via INTRAVENOUS
  Administered 2014-11-20 (×2): 5 mg via INTRAVENOUS

## 2014-11-20 MED ORDER — CEFAZOLIN SODIUM 1-5 GM-% IV SOLN
1.0000 g | Freq: Three times a day (TID) | INTRAVENOUS | Status: AC
  Administered 2014-11-20 – 2014-11-21 (×2): 1 g via INTRAVENOUS
  Filled 2014-11-20 (×2): qty 50

## 2014-11-20 MED ORDER — LACTATED RINGERS IV SOLN
INTRAVENOUS | Status: DC
  Administered 2014-11-20 (×2): via INTRAVENOUS

## 2014-11-20 MED ORDER — SODIUM CHLORIDE 0.9 % IJ SOLN: 3.0000 mL | INTRAMUSCULAR | Status: DC | PRN

## 2014-11-20 MED ORDER — FLEET ENEMA 7-19 GM/118ML RE ENEM
1.0000 | ENEMA | Freq: Once | RECTAL | Status: DC
Start: 2014-11-20 — End: 2014-11-23

## 2014-11-20 MED ORDER — FENTANYL CITRATE (PF) 100 MCG/2ML IJ SOLN
25.0000 ug | INTRAMUSCULAR | Status: AC | PRN
  Administered 2014-11-20 (×6): 25 ug via INTRAVENOUS

## 2014-11-20 MED ORDER — ONDANSETRON HCL 4 MG/2ML IJ SOLN
INTRAMUSCULAR | Status: DC | PRN
  Administered 2014-11-20: 4 mg via INTRAVENOUS

## 2014-11-20 MED ORDER — ACETAMINOPHEN 10 MG/ML IV SOLN
INTRAVENOUS | Status: DC | PRN
  Administered 2014-11-20: 1000 mg via INTRAVENOUS

## 2014-11-20 MED ORDER — ARTIFICIAL TEARS OP OINT
TOPICAL_OINTMENT | OPHTHALMIC | Status: DC | PRN
  Administered 2014-11-20: 1 via OPHTHALMIC

## 2014-11-20 MED ORDER — PROPOFOL 10 MG/ML IV BOLUS
INTRAVENOUS | Status: AC
  Filled 2014-11-20: qty 20

## 2014-11-20 MED ORDER — ACETAMINOPHEN 10 MG/ML IV SOLN
INTRAVENOUS | Status: AC
  Filled 2014-11-20: qty 100

## 2014-11-20 MED ORDER — SUCCINYLCHOLINE CHLORIDE 20 MG/ML IJ SOLN
INTRAMUSCULAR | Status: DC | PRN
  Administered 2014-11-20: 100 mg via INTRAVENOUS

## 2014-11-20 MED ORDER — FENTANYL CITRATE (PF) 100 MCG/2ML IJ SOLN
INTRAMUSCULAR | Status: AC
  Filled 2014-11-20: qty 2

## 2014-11-20 MED ORDER — SODIUM CHLORIDE 0.9 % IV SOLN: 250.0000 mL | INTRAVENOUS | Status: DC

## 2014-11-20 MED ORDER — ONDANSETRON HCL 4 MG/2ML IJ SOLN: 4.0000 mg | INTRAMUSCULAR | Status: DC | PRN

## 2014-11-20 MED ORDER — NEOSTIGMINE METHYLSULFATE 10 MG/10ML IV SOLN
INTRAVENOUS | Status: DC | PRN
  Administered 2014-11-20: 4 mg via INTRAVENOUS

## 2014-11-20 MED ORDER — RISPERIDONE 0.5 MG PO TABS
0.2500 mg | ORAL_TABLET | Freq: Every day | ORAL | Status: DC
  Administered 2014-11-20 – 2014-11-22 (×3): 0.25 mg via ORAL
  Filled 2014-11-20 (×3): qty 1

## 2014-11-20 MED ORDER — VANCOMYCIN HCL 1000 MG IV SOLR
INTRAVENOUS | Status: AC
  Filled 2014-11-20: qty 1000

## 2014-11-20 MED ORDER — LIDOCAINE HCL (CARDIAC) 20 MG/ML IV SOLN
INTRAVENOUS | Status: DC | PRN
  Administered 2014-11-20: 60 mg via INTRAVENOUS

## 2014-11-20 MED ORDER — METHOCARBAMOL 500 MG PO TABS
500.0000 mg | ORAL_TABLET | Freq: Four times a day (QID) | ORAL | Status: DC | PRN
Start: 2014-11-20 — End: 2014-11-23
  Administered 2014-11-20 – 2014-11-23 (×5): 500 mg via ORAL
  Filled 2014-11-20 (×6): qty 1

## 2014-11-20 MED ORDER — ROCURONIUM BROMIDE 100 MG/10ML IV SOLN
INTRAVENOUS | Status: DC | PRN
  Administered 2014-11-20: 5 mg via INTRAVENOUS
  Administered 2014-11-20: 50 mg via INTRAVENOUS
  Administered 2014-11-20: 10 mg via INTRAVENOUS

## 2014-11-20 MED ORDER — MORPHINE SULFATE 2 MG/ML IJ SOLN
1.0000 mg | INTRAMUSCULAR | Status: DC | PRN
  Administered 2014-11-20: 2 mg via INTRAVENOUS
  Administered 2014-11-20: 4 mg via INTRAVENOUS
  Administered 2014-11-21 (×2): 2 mg via INTRAVENOUS
  Filled 2014-11-20: qty 2
  Filled 2014-11-20 (×3): qty 1

## 2014-11-20 MED ORDER — METOPROLOL SUCCINATE ER 25 MG PO TB24
25.0000 mg | ORAL_TABLET | Freq: Every day | ORAL | Status: DC
  Administered 2014-11-21 – 2014-11-23 (×3): 25 mg via ORAL
  Filled 2014-11-20 (×3): qty 1

## 2014-11-20 MED ORDER — MENTHOL 3 MG MT LOZG: 1.0000 | LOZENGE | OROMUCOSAL | Status: DC | PRN

## 2014-11-20 MED ORDER — METHOCARBAMOL 500 MG PO TABS
ORAL_TABLET | ORAL | Status: AC
  Filled 2014-11-20: qty 1

## 2014-11-20 MED ORDER — ROCURONIUM BROMIDE 50 MG/5ML IV SOLN
INTRAVENOUS | Status: AC
  Filled 2014-11-20: qty 1

## 2014-11-20 MED ORDER — THROMBIN 20000 UNITS EX SOLR
CUTANEOUS | Status: DC | PRN
  Administered 2014-11-20: 20 mL via TOPICAL

## 2014-11-20 MED ORDER — GLYCOPYRROLATE 0.2 MG/ML IJ SOLN
INTRAMUSCULAR | Status: AC
  Filled 2014-11-20: qty 3

## 2014-11-20 MED ORDER — ONDANSETRON HCL 4 MG/2ML IJ SOLN
INTRAMUSCULAR | Status: AC
  Filled 2014-11-20: qty 2

## 2014-11-20 MED ORDER — ROCURONIUM BROMIDE 100 MG/10ML IV SOLN: INTRAVENOUS | Status: DC | PRN

## 2014-11-20 MED ORDER — SODIUM CHLORIDE 0.9 % IJ SOLN
3.0000 mL | Freq: Two times a day (BID) | INTRAMUSCULAR | Status: DC
  Administered 2014-11-21: 3 mL via INTRAVENOUS

## 2014-11-20 MED ORDER — ZOLPIDEM TARTRATE 5 MG PO TABS
5.0000 mg | ORAL_TABLET | Freq: Every evening | ORAL | Status: DC | PRN
  Administered 2014-11-20 – 2014-11-22 (×3): 5 mg via ORAL
  Filled 2014-11-20 (×3): qty 1

## 2014-11-20 MED ORDER — MIDAZOLAM HCL 2 MG/2ML IJ SOLN
INTRAMUSCULAR | Status: AC
  Filled 2014-11-20: qty 2

## 2014-11-20 MED ORDER — ARTIFICIAL TEARS OP OINT
TOPICAL_OINTMENT | OPHTHALMIC | Status: AC
  Filled 2014-11-20: qty 3.5

## 2014-11-20 MED ORDER — FENTANYL CITRATE (PF) 100 MCG/2ML IJ SOLN
INTRAMUSCULAR | Status: DC | PRN
  Administered 2014-11-20 (×5): 50 ug via INTRAVENOUS

## 2014-11-20 MED ORDER — BUPIVACAINE-EPINEPHRINE (PF) 0.25% -1:200000 IJ SOLN
INTRAMUSCULAR | Status: DC | PRN
  Administered 2014-11-20: 10 mL

## 2014-11-20 MED ORDER — HYDROCODONE-ACETAMINOPHEN 5-325 MG PO TABS
1.0000 | ORAL_TABLET | ORAL | Status: DC | PRN
  Administered 2014-11-20 – 2014-11-21 (×3): 2 via ORAL
  Administered 2014-11-21: 1 via ORAL
  Administered 2014-11-21 – 2014-11-22 (×5): 2 via ORAL
  Administered 2014-11-23: 1 via ORAL
  Administered 2014-11-23 (×3): 2 via ORAL
  Filled 2014-11-20 (×14): qty 2

## 2014-11-20 MED ORDER — MAGNESIUM CITRATE PO SOLN: 0.5000 | Freq: Once | ORAL | Status: DC

## 2014-11-20 MED ORDER — STERILE WATER FOR INJECTION IJ SOLN
INTRAMUSCULAR | Status: AC
  Filled 2014-11-20: qty 10

## 2014-11-20 MED ORDER — PROPOFOL 10 MG/ML IV BOLUS
INTRAVENOUS | Status: DC | PRN
  Administered 2014-11-20: 160 mg via INTRAVENOUS

## 2014-11-20 MED ORDER — EPHEDRINE SULFATE 50 MG/ML IJ SOLN: INTRAMUSCULAR | Status: DC | PRN

## 2014-11-20 SURGICAL SUPPLY — 69 items
BNDG GAUZE ELAST 4 BULKY (GAUZE/BANDAGES/DRESSINGS) ×3 IMPLANT
BUR ROUND PRECISION 4.0 (BURR) ×1 IMPLANT
BUR ROUND PRECISION 4.0MM (BURR) ×1
BUR SURG 4X8 MED (BURR) IMPLANT
BURR SURG 4MMX8MM MEDIUM (BURR) ×1
BURR SURG 4X8 MED (BURR) ×2
CLOSURE STERI-STRIP 1/2X4 (GAUZE/BANDAGES/DRESSINGS) ×1
CLSR STERI-STRIP ANTIMIC 1/2X4 (GAUZE/BANDAGES/DRESSINGS) ×1 IMPLANT
CORDS BIPOLAR (ELECTRODE) ×3 IMPLANT
COVER SURGICAL LIGHT HANDLE (MISCELLANEOUS) ×3 IMPLANT
DRAIN CHANNEL 15F RND FF W/TCR (WOUND CARE) ×2 IMPLANT
DRAPE POUCH INSTRU U-SHP 10X18 (DRAPES) ×3 IMPLANT
DRAPE SURG 17X11 SM STRL (DRAPES) ×3 IMPLANT
DRAPE U-SHAPE 47X51 STRL (DRAPES) ×3 IMPLANT
DRSG MEPILEX BORDER 4X4 (GAUZE/BANDAGES/DRESSINGS) ×3 IMPLANT
DRSG MEPILEX BORDER 4X8 (GAUZE/BANDAGES/DRESSINGS) ×2 IMPLANT
DURAPREP 26ML APPLICATOR (WOUND CARE) ×3 IMPLANT
ELECT BLADE 4.0 EZ CLEAN MEGAD (MISCELLANEOUS) ×3
ELECT PENCIL ROCKER SW 15FT (MISCELLANEOUS) ×3 IMPLANT
ELECT REM PT RETURN 9FT ADLT (ELECTROSURGICAL) ×3
ELECTRODE BLDE 4.0 EZ CLN MEGD (MISCELLANEOUS) ×1 IMPLANT
ELECTRODE REM PT RTRN 9FT ADLT (ELECTROSURGICAL) ×1 IMPLANT
EVACUATOR SILICONE 100CC (DRAIN) ×2 IMPLANT
GAUZE SPONGE 4X4 12PLY STRL (GAUZE/BANDAGES/DRESSINGS) ×3 IMPLANT
GLOVE BIO SURGEON STRL SZ 6.5 (GLOVE) ×2 IMPLANT
GLOVE BIO SURGEONS STRL SZ 6.5 (GLOVE) ×2
GLOVE BIOGEL PI IND STRL 7.0 (GLOVE) IMPLANT
GLOVE BIOGEL PI IND STRL 8 (GLOVE) ×1 IMPLANT
GLOVE BIOGEL PI IND STRL 8.5 (GLOVE) ×1 IMPLANT
GLOVE BIOGEL PI INDICATOR 7.0 (GLOVE) ×2
GLOVE BIOGEL PI INDICATOR 8 (GLOVE) ×2
GLOVE BIOGEL PI INDICATOR 8.5 (GLOVE) ×2
GLOVE ORTHO TXT STRL SZ7.5 (GLOVE) ×3 IMPLANT
GLOVE SS BIOGEL STRL SZ 8.5 (GLOVE) ×1 IMPLANT
GLOVE SUPERSENSE BIOGEL SZ 8.5 (GLOVE) ×2
GOWN STRL REUS W/ TWL LRG LVL3 (GOWN DISPOSABLE) ×1 IMPLANT
GOWN STRL REUS W/TWL 2XL LVL3 (GOWN DISPOSABLE) ×6 IMPLANT
GOWN STRL REUS W/TWL LRG LVL3 (GOWN DISPOSABLE) ×3
KIT BASIN OR (CUSTOM PROCEDURE TRAY) ×3 IMPLANT
KIT STIMULAN RAPID CURE 5CC (Orthopedic Implant) ×2 IMPLANT
NDL SPNL 18GX3.5 QUINCKE PK (NEEDLE) ×2 IMPLANT
NEEDLE 22X1 1/2 (OR ONLY) (NEEDLE) ×3 IMPLANT
NEEDLE SPNL 18GX3.5 QUINCKE PK (NEEDLE) ×6 IMPLANT
NS IRRIG 1000ML POUR BTL (IV SOLUTION) ×3 IMPLANT
PACK LAMINECTOMY ORTHO (CUSTOM PROCEDURE TRAY) ×3 IMPLANT
PACK UNIVERSAL I (CUSTOM PROCEDURE TRAY) ×3 IMPLANT
PATTIES SURGICAL .5 X.5 (GAUZE/BANDAGES/DRESSINGS) IMPLANT
PATTIES SURGICAL .5 X1 (DISPOSABLE) ×6 IMPLANT
PUTTY DBX 5CC (Putty) ×2 IMPLANT
SPONGE LAP 4X18 X RAY DECT (DISPOSABLE) IMPLANT
SPONGE SURGIFOAM ABS GEL 100 (HEMOSTASIS) ×3 IMPLANT
SURGIFLO TRUKIT (HEMOSTASIS) ×2 IMPLANT
SUT BONE WAX W31G (SUTURE) ×3 IMPLANT
SUT ETHILON 2 0 FS 18 (SUTURE) ×2 IMPLANT
SUT MON AB 3-0 SH 27 (SUTURE) ×3
SUT MON AB 3-0 SH27 (SUTURE) ×1 IMPLANT
SUT VIC AB 1 CT1 27 (SUTURE) ×6
SUT VIC AB 1 CT1 27XBRD ANTBC (SUTURE) ×2 IMPLANT
SUT VIC AB 2-0 CT1 18 (SUTURE) ×6 IMPLANT
SUT VIC AB 2-0 CT1 27 (SUTURE) ×3
SUT VIC AB 2-0 CT1 TAPERPNT 27 (SUTURE) IMPLANT
SUT VICRYL 0 UR6 27IN ABS (SUTURE) ×3 IMPLANT
SYR BULB IRRIGATION 50ML (SYRINGE) ×3 IMPLANT
SYR CONTROL 10ML LL (SYRINGE) ×3 IMPLANT
TAPE CLOTH SURG 4X10 WHT LF (GAUZE/BANDAGES/DRESSINGS) ×2 IMPLANT
TOWEL OR 17X26 10 PK STRL BLUE (TOWEL DISPOSABLE) ×6 IMPLANT
TRAY FOLEY CATH 16FRSI W/METER (SET/KITS/TRAYS/PACK) IMPLANT
WATER STERILE IRR 1000ML POUR (IV SOLUTION) ×1 IMPLANT
YANKAUER SUCT BULB TIP NO VENT (SUCTIONS) ×3 IMPLANT

## 2014-11-20 NOTE — Transfer of Care (Signed)
Immediate Anesthesia Transfer of Care Note  Patient: Brenda Solis  Procedure(s) Performed: Procedure(s): REVISION L4-S1 DECOMPRESSION, L4-L5 IN SITU FUSION (LEVEL 1) (N/A)  Patient Location: PACU  Anesthesia Type:General  Level of Consciousness: awake, alert , oriented and sedated  Airway & Oxygen Therapy: Patient Spontanous Breathing and Patient connected to nasal cannula oxygen  Post-op Assessment: Report given to RN, Post -op Vital signs reviewed and stable and Patient moving all extremities  Post vital signs: Reviewed and stable  Last Vitals:  Filed Vitals:   11/20/14 0704  BP: 154/63  Pulse: 59  Temp: 36.5 C  Resp: 20    Complications: No apparent anesthesia complications

## 2014-11-20 NOTE — Anesthesia Procedure Notes (Signed)
Procedure Name: Intubation Date/Time: 11/20/2014 8:42 AM Performed by: Scheryl Darter Pre-anesthesia Checklist: Patient identified, Emergency Drugs available, Suction available, Patient being monitored and Timeout performed Patient Re-evaluated:Patient Re-evaluated prior to inductionOxygen Delivery Method: Circle system utilized Preoxygenation: Pre-oxygenation with 100% oxygen Intubation Type: IV induction and Rapid sequence Ventilation: Mask ventilation without difficulty Laryngoscope Size: Miller and 2 Grade View: Grade I Tube type: Oral Tube size: 7.5 mm Number of attempts: 1 Airway Equipment and Method: Stylet Placement Confirmation: ETT inserted through vocal cords under direct vision and positive ETCO2 Secured at: 21 cm Tube secured with: Tape Dental Injury: Teeth and Oropharynx as per pre-operative assessment

## 2014-11-20 NOTE — Brief Op Note (Signed)
11/20/2014  1:10 PM  PATIENT:  Brenda Solis  80 y.o. female  PRE-OPERATIVE DIAGNOSIS:  L4-L5 RECURRENT SPINAL STENOSIS WITH GRADE 1 SLIP  POST-OPERATIVE DIAGNOSIS:  L4-L5 RECURRENT SPINAL STENOSIS WITH GRADE 1 SLIP  PROCEDURE:  Procedure(s): REVISION L4-S1 DECOMPRESSION, L4-L5 IN SITU FUSION (LEVEL 1) (N/A)  SURGEON:  Surgeon(s) and Role:    * Melina Schools, MD - Primary  PHYSICIAN ASSISTANT:   ASSISTANTS: none   ANESTHESIA:   general  EBL:  Total I/O In: 1950 [I.V.:1700; IV Piggyback:250] Out: 712 [Urine:185; Blood:500]  BLOOD ADMINISTERED:none  DRAINS: JP in the back  LOCAL MEDICATIONS USED:  MARCAINE     SPECIMEN:  No Specimen  DISPOSITION OF SPECIMEN:  N/A  COUNTS:  YES  TOURNIQUET:  * No tourniquets in log *  DICTATION: .Other Dictation: Dictation Number [id number]  PLAN OF CARE: Admit to inpatient   PATIENT DISPOSITION:  PACU - hemodynamically stable.

## 2014-11-20 NOTE — H&P (Signed)
History of Present Illness The patient is a 80 year old female who presents to the practice today for a transition into care. The patient is transitioning into care from another physician and a summary of care was reviewed .  Additional reasons for visit:  Back pain is described as the following: The patient is here today for a surgical consult. The patient reports low back symptoms including pain and bil leg pain which began year(s) ago. Symptoms are reported to be located symmetrically and Symptoms include pain, muscle spasm, stiffness, weakness and pain in the calf. The pain radiates to the left buttock, left posterior thigh, left posterior lower leg, left foot, right buttock, right posterior thigh, right posterior lower leg and right foot. The patient describes the pain as throbbing. The patient describes the severity of their symptoms as severe. The patient feels as if the symptoms are unchanging. Symptoms are exacerbated by standing. Symptoms are relieved by recumbency, activity modification, nonsteroidal anti-inflammatory drugs and non-opioid analgesics. Current treatment includes nonsteroidal anti-inflammatory drugs, opioid analgesics and muscle relaxants. Pertinent medical history includes previous back surgery and spinal stenosis. Prior to being seen today the patient was previously evaluated Dr. Gladstone Lighter. Symptoms present at the patient's previous evaluation included back pain and decreased range of motion. Past evaluation has included CT of the lumbar spine and MRI of the lumbar spine. Past treatment has included nonsteroidal anti-inflammatory drugs, opioid analgesics, muscle relaxants, corticosteroids and back surgery.  Problem List/Past Medical  Spinal stenosis of lumbar region (M48.06) Aftercare following surgery of the musculoskeletal system (Z47.89) Lumbar facet arthropathy (M46.96) Osteoarthritis, Knee (715.96) Bursitis of hip (M70.70) Spondylolisthesis, lumbar region  (M43.16) Spondylosis without myelopathy or radiculopathy, lumbar region (M47.816)  Allergies No Known Drug Allergies05/29/2014  Family History First Degree Relatives Cerebrovascular Accident First Degree Relatives. father Heart Disease First Degree Relatives. brother  Social History  Living situation live alone Children 3 Tobacco / smoke exposure no Tobacco use Never smoker. never smoker Marital status widowed Current work status retired Alcohol use never consumed alcohol  Past Surgical History  Total Hip Replacement right Tubal Ligation Spinal Fusion neck Appendectomy  Other Problems  Lumbar spine pain (724.2) Derangement, lateral meniscus NEC (717.49)02/24/2009 Osteoarthrosis, local NOS, pelvis/thigh (715.35)02/17/2010   Objective Transcription She is a pleasant woman, appears younger than her stated age. She is alert. She is oriented x3. She has no shortness breath or chest pain. The abdomen is soft and nontender. No loss of bowel and bladder control. She has horrific back pain radiating down bilaterally into both hamstring and down into both legs. She has horrific pain with extension or rotation despite relief with forward flexion. She has an obvious gait disturbance with a right footdrop. She has no hip, knee or ankle pain with range of motion. The compartments are soft and nontender. She has numbness and dysesthesias in both lower extremities worse in the proximal aspect of the hamstring and lateral aspect of the thigh, but still present in the calf. Intact 1+ peripheral pulses. She had a CT myelogram that was done on 10/01/2014. She has actually severe spinal canal at L4-5 with an anterolisthesis at L4-5. There is actually a dye cut-off at L4-5, indicating how severe the stenosis is. This has actually progressed since her previous imaging study. She has chronic degenerative disc disease at L1-2 and L2-3, but it only produces mild  stenosis.    Assessment & Plan   Goal Of Surgery:Discussed that goal of surgery is to reduce pain and improve function and  quality of life. Patient is aware that despite all appropriate treatment that there pain and function could be the same, worse, or different. Posterior decompression/Fusion:Risks of surgery include infection, bleeding, nerve damage, death, stroke, paralysis, failure to heal, need for further surgery, ongoing or worse pain, need for further surgery, CSF leak, loss of bowel or bladder, and recurrent disc herniation or stenosis which would necessitate need for further surgery. Non-union, adjacent segment disease and recurrent pain.  At this point in time, the patient's principal problem is lumbar spinal stenosis with neurogenic claudication. She has had a previous L4-5 decompression for a similar symptom. She states she got a little bit better for short period of time and then essentially has been getting progressively worse over the last 14 to 18 months. At this point, her myelogram is quite concerning because the dye flow cut-off the high-grade spinal stenosis. It is consistent with her clinical findings of neurogenic claudication. At this point, my recommendation is to do a revision decompression at L4-5. This has essentially taken down starting at the L5 level and then doing a laminectomy at L5 so, that it could be in the correct plane, remove the scar tissue at the L4-5 level and complete the L4 laminectomy and bring that out wider to about the medial border of the pedicle. This large central decompression will address the severe central stenosis. Then, I will do a revision foraminotomy and lateral recess decompression. Should she already has the anterior instability, I would then recommend doing an in situ posterolateral fusion at L4-5. Given the underlying osteopenia that she has, I am reluctant to recommend instrumentation as this may lead to further complicating features. I am  also going to get her an AFO brace for her chronic footdrop. I have had a long talk with the patient and her daughter, the goal of surgery are essentially to help eliminate her pain. I have indicated to her that she will feel the relief that she gets when she leans forward or if she is standing in erect neutral position. I am a little less confident in how much of the nerve function in terms of the foot drop can be restored simply because of the severity and duration of that symptom. The risks of surgery include infection, bleeding, nerve damage, death, stroke, paralysis, failure to heal, need for further surgery, ongoing or worse pain, and leak of spinal fluid. All of her questions were addressed. We will plan on moving forward with surgery in a very timely manner because of the severity of her pain and deterioration that she has been feeling in terms of pain and quality of life.

## 2014-11-20 NOTE — Anesthesia Preprocedure Evaluation (Addendum)
Anesthesia Evaluation  Patient identified by MRN, date of birth, ID band Patient awake    Reviewed: Allergy & Precautions, NPO status , Patient's Chart, lab work & pertinent test results  Airway Mallampati: II  TM Distance: >3 FB Neck ROM: Full    Dental   Pulmonary neg pulmonary ROS,  breath sounds clear to auscultation        Cardiovascular hypertension, Rhythm:Regular Rate:Normal     Neuro/Psych    GI/Hepatic negative GI ROS, Neg liver ROS,   Endo/Other  Hypothyroidism   Renal/GU negative Renal ROS     Musculoskeletal   Abdominal   Peds  Hematology   Anesthesia Other Findings   Reproductive/Obstetrics                            Anesthesia Physical Anesthesia Plan  ASA: III  Anesthesia Plan: General   Post-op Pain Management:    Induction: Intravenous  Airway Management Planned: Oral ETT  Additional Equipment:   Intra-op Plan:   Post-operative Plan: Possible Post-op intubation/ventilation  Informed Consent: I have reviewed the patients History and Physical, chart, labs and discussed the procedure including the risks, benefits and alternatives for the proposed anesthesia with the patient or authorized representative who has indicated his/her understanding and acceptance.   Dental advisory given  Plan Discussed with: CRNA and Anesthesiologist  Anesthesia Plan Comments:         Anesthesia Quick Evaluation

## 2014-11-21 ENCOUNTER — Encounter (HOSPITAL_COMMUNITY): Payer: Self-pay | Admitting: Orthopedic Surgery

## 2014-11-21 NOTE — Progress Notes (Signed)
    Subjective: Procedure(s) (LRB): REVISION L4-S1 DECOMPRESSION, L4-L5 IN SITU FUSION (LEVEL 1) (N/A) 1 Day Post-Op  Patient reports pain as 3 on 0-10 scale.  Reports decreased leg pain reports incisional back pain   Positive void Negative bowel movement Positive flatus Negative chest pain or shortness of breath  Objective: Vital signs in last 24 hours: Temp:  [98 F (36.7 C)-98.9 F (37.2 C)] 98.2 F (36.8 C) (05/26 0552) Pulse Rate:  [61-75] 75 (05/26 0552) Resp:  [13-18] 16 (05/26 0552) BP: (101-132)/(34-52) 112/42 mmHg (05/26 0552) SpO2:  [99 %-100 %] 100 % (05/26 0552)  Intake/Output from previous day: 05/25 0701 - 05/26 0700 In: 3493.5 [P.O.:100; I.V.:2643.5; IV Piggyback:450] Out: 5701 [Urine:2885; Drains:260; Blood:500]  Labs: No results for input(s): WBC, RBC, HCT, PLT in the last 72 hours. No results for input(s): NA, K, CL, CO2, BUN, CREATININE, GLUCOSE, CALCIUM in the last 72 hours. No results for input(s): LABPT, INR in the last 72 hours.  Physical Exam: Neurologically intact ABD soft Neurovascular intact Incision: dressing C/D/I and no drainage Compartment soft  Assessment/Plan: Patient stable  xrays n/a Continue mobilization with physical therapy Continue care  Advance diet Up with therapy    Melina Schools, MD [place] [phone]

## 2014-11-21 NOTE — Evaluation (Signed)
Occupational Therapy Evaluation Patient Details Name: Brenda Solis MRN: [id number] DOB: [birth year] Today's Date: 11/21/2014    History of Present Illness s/p decompressive lumbar laminectomy L4-S1   Clinical Impression   This 80 yo female admitted and underwent above presents to acute OT with decreased mobility and back precautions affecting her ability to care for herself independently as she was doing at home pta. She will benefit from one more session of acute OT with follow up [place].    Follow Up Recommendations  Home health OT    Equipment Recommendations  None recommended by OT       Precautions / Restrictions Precautions Precautions: Back Precaution Booklet Issued: Yes (comment) Precaution Comments: Pt able to state all 3 back precautions when asked Required Braces or Orthoses: Spinal Brace Spinal Brace: Lumbar corset;Applied in sitting position Restrictions Weight Bearing Restrictions: No      Mobility Bed Mobility Overal bed mobility: Needs Assistance Bed Mobility: Rolling;Sidelying to Sit;Sit to Sidelying Rolling: Supervision Sidelying to sit: Supervision (with rail)     Sit to sidelying: Supervision (without rail) General bed mobility comments: Cues for proper technique  Transfers Overall transfer level: Needs assistance Equipment used: Rolling walker (2 wheeled) Transfers: Sit to/from Stand Sit to Stand: Supervision         General transfer comment: Cues for safe hand placement         ADL Overall ADL's : Needs assistance/impaired Eating/Feeding: Independent;Sitting   Grooming: Set up;Supervision/safety;Standing Grooming Details (indicate cue type and reason): Explained to pt how it is beneficial to use 2 cups for brushing her teeth to avoid bending over the sink (a rinse cup and a spit cup) Upper Body Bathing: Set up;Supervision/ safety   Lower Body Bathing: Moderate assistance (for lower legs and feet with S sit<>stand)   Upper Body  Dressing : Set up;Sitting   Lower Body Dressing: Moderate assistance (for socks and shoes, with S sit<>stand, pt states that family will A her with LBD prn.)   Toilet Transfer: Supervision/safety;Ambulation Toilet Transfer Details (indicate cue type and reason): Educated pt on better stance for sit<>stand from toilet to allow her to keep her back as straight as possible and avoid bending Toileting- Clothing Manipulation and Hygiene: Supervision/safety Toileting - Clothing Manipulation Details (indicate cue type and reason): Educated pt on use of wet wipes for back peri care to increased ease of hygiene and avoid as much twisting             Vision Additional Comments: No change from baseline          Pertinent Vitals/Pain Pain Assessment: No/denies pain Pain Score: 0-No pain      Hand Dominance Right   Extremity/Trunk Assessment Upper Extremity Assessment Upper Extremity Assessment: Overall WFL for tasks assessed     Communication Communication Communication: No difficulties   Cognition Arousal/Alertness: Awake/alert Behavior During Therapy: WFL for tasks assessed/performed Overall Cognitive Status: Within Functional Limits for tasks assessed                                Home Living Family/patient expects to be discharged to:: Private residence Living Arrangements: Alone Available Help at Discharge: Family;Available 24 hours/day Type of Home: House Home Access: Stairs to enter [employer] of Steps: 2 Entrance Stairs-Rails: Right Home Layout: One level     Bathroom Shower/Tub: Tub/shower unit;Curtain Shower/tub characteristics: Architectural technologist: Standard     Home Equipment: Cane - single  point;Shower seat;Walker - 2 wheels;Toilet riser   Additional Comments: Reports family will be able to stay with her 24 hours/day through the holiday and then A  from family and friends as needed.      Prior Functioning/Environment Level of  Independence: Independent with assistive device(s)        Comments: Amb with cane    OT Diagnosis: Generalized weakness   OT Problem List: Decreased strength;Decreased knowledge of use of DME or AE;Decreased knowledge of precautions   OT Treatment/Interventions: Self-care/ADL training;Patient/family education;Balance training;DME and/or AE instruction    OT Goals(Current goals can be found in the care plan section) Acute Rehab OT Goals Patient Stated Goal: home maybe tomorrow OT Goal Formulation: With patient Time For Goal Achievement: 11/28/14 Potential to Achieve Goals: Good  OT Frequency: Min 2X/week              End of Session Equipment Utilized During Treatment: Rolling walker;Back brace  Activity Tolerance: Patient tolerated treatment well Patient left: in bed;with call bell/phone within reach   Time: 4695-0722 OT Time Calculation (min): 20 min Charges:  OT General Charges $OT Visit: 1 Procedure OT Evaluation $Initial OT Evaluation Tier I: 1 Procedure  Almon Register [phone] 11/21/2014, 2:09 PM

## 2014-11-21 NOTE — Evaluation (Signed)
Physical Therapy Evaluation Patient Details Name: Brenda Solis MRN: [id number] DOB: 08/26/1934 Today's Date: 11/21/2014   History of Present Illness  s/p decompressive lumbar laminectomy L4-S1  Clinical Impression  Pt admitted with above diagnosis. Pt currently with functional limitations due to the deficits listed below (see PT Problem List).  Pt will benefit from skilled PT to increase their independence and safety with mobility to allow discharge to the venue listed below.  Pt moving at MIn/guard to MIN A level with RW and some cueing for body mechanics.  Pt will have 24 hour family/friend support through next week and intermittent A after that.  Feel pt is safe to d/c home with HHPT.      Follow Up Recommendations Home health PT    Equipment Recommendations  None recommended by PT    Recommendations for Other Services       Precautions / Restrictions Precautions Precautions: Back Precaution Booklet Issued: Yes (comment) Precaution Comments: Reviewed back precautions and how to incorporate into mobility as well as brace. Required Braces or Orthoses: Spinal Brace Spinal Brace: Lumbar corset;Applied in sitting position      Mobility  Bed Mobility Overal bed mobility: Needs Assistance Bed Mobility: Rolling;Sidelying to Sit;Sit to Sidelying Rolling: Supervision Sidelying to sit: Min assist     Sit to sidelying: Min assist General bed mobility comments: Cues for body mechanics with transition to EOB.  sit > supine needed A for LE and supine> sit she needed very light MIn A at end of transition to fully get trunk upright. Educated on use of brace.   Transfers Overall transfer level: Needs assistance Equipment used: Rolling walker (2 wheeled) Transfers: Sit to/from Stand Sit to Stand: Min guard         General transfer comment: Cues for hand placement and for proper form.  Ambulation/Gait Ambulation/Gait assistance: Min guard Ambulation Distance (Feet): 55  Feet Assistive device: Rolling walker (2 wheeled) Gait Pattern/deviations: Trunk flexed;Step-through pattern     General Gait Details: decreased speed and guarded. Adjusted RW at end of gait session due to pt height.  Stairs            Wheelchair Mobility    Modified Rankin (Stroke Patients Only)       Balance Overall balance assessment: Needs assistance Sitting-balance support: Feet supported Sitting balance-Leahy Scale: Good     Standing balance support: Bilateral upper extremity supported Standing balance-Leahy Scale: Fair Standing balance comment: Pt able to adjust brace as needed in standing.                             Pertinent Vitals/Pain Pain Assessment: 0-10 Pain Score: 3  Pain Descriptors / Indicators: Aching Pain Intervention(s): Premedicated before session;Limited activity within patient's tolerance;Monitored during session    [place] expects to be discharged to:: Private residence Living Arrangements: Alone Available Help at Discharge: Family;Available 24 hours/day Type of Home: House Home Access: Stairs to enter Entrance Stairs-Rails: Right Entrance Stairs-Number of Steps: 2 (not together ) Home Layout: One level Home Equipment: Cane - single point;Bedside commode;Shower seat;Walker - 2 wheels Additional Comments: Reports family will be able to stay with her 24 hours/day through next week and then friends can A as needed.    Prior Function Level of Independence: Independent with assistive device(s)         Comments: Amb with cane     Hand Dominance        Extremity/Trunk  Assessment   Upper Extremity Assessment: Defer to OT evaluation           Lower Extremity Assessment: Overall WFL for tasks assessed      Cervical / Trunk Assessment: Other exceptions  Communication   Communication: No difficulties  Cognition Arousal/Alertness: Awake/alert Behavior During Therapy: WFL for tasks  assessed/performed Overall Cognitive Status: Within Functional Limits for tasks assessed                      General Comments General comments (skin integrity, edema, etc.): Pt able to recall back precautions at end of session without any verbal cues. Pt up in recliner upon arrival.  Adjusted brace, then ambulated and practiced bed mobility and remained in bed at end of session.    Exercises        Assessment/Plan    PT Assessment Patient needs continued PT services  PT Diagnosis Difficulty walking   PT Problem List Decreased activity tolerance;Decreased mobility;Decreased knowledge of precautions;Decreased knowledge of use of DME  PT Treatment Interventions DME instruction;Gait training;Functional mobility training;Therapeutic activities;Therapeutic exercise;Patient/family education   PT Goals (Current goals can be found in the Care Plan section) Acute Rehab PT Goals Patient Stated Goal: to get moving PT Goal Formulation: With patient Time For Goal Achievement: 11/28/14 Potential to Achieve Goals: Good    Frequency Min 5X/week   Barriers to discharge        Co-evaluation               End of Session Equipment Utilized During Treatment: Gait belt;Back brace Activity Tolerance: Patient tolerated treatment well Patient left: in bed;with call bell/phone within reach;Other (comment);with SCD's reapplied (with lunch) Nurse Communication: Mobility status         Time: 8366-2947 PT Time Calculation (min) (ACUTE ONLY): 29 min   Charges:   PT Evaluation $Initial PT Evaluation Tier I: 1 Procedure PT Treatments $Gait Training: 8-22 mins   PT G Codes:        Dyamon Sosinski [id number] 11/21/2014, 1:43 PM

## 2014-11-21 NOTE — Op Note (Signed)
NAMEMarland Kitchen  Brenda, Solis                 ACCOUNT NO.:  [id number]  MEDICAL RECORD NO.:  [id number]  LOCATION:  5N23C                        FACILITY:  [facility]  PHYSICIAN:  Jourdyn Ferrin D. [name], M.D. DATE OF BIRTH:  1934/12/01  DATE OF PROCEDURE:  11/20/2014 DATE OF DISCHARGE:                              OPERATIVE REPORT   PREOPERATIVE DIAGNOSES: 1. Recurrent lumbar spinal stenosis, L4-L5. 2. Grade 1 degenerative spondylolisthesis, L4-L5.  POSTOPERATIVE DIAGNOSES: 1. Recurrent lumbar spinal stenosis, L4-L5. 2. Grade 1 degenerative spondylolisthesis, L4-L5.  OPERATIVE PROCEDURE:  Posterior decompression at L4-S1.  In situ arthrodesis at L4-L5 with local autograft bone plus DBX.  HISTORY:  This is a very pleasant 80 year old woman, who had a previous L4-L5 decompression done, but continued to have a footdrop and severe back pain and neurogenic claudication (bilateral leg pain). Preoperative CT myelogram confirmed a complete cut off at L4-L5 with ongoing severe spinal stenosis.  As a result, she ultimately presented to my care for further workup and treatment.  After discussing treatment options, she elected to proceed with surgery.  All risks, benefits, and alternatives to surgery were discussed and consent was obtained.  OPERATIVE FINDINGS:  Initially, I felt as though L4-L5 revision decompression would be required, however, because of the extensive scar, I elected to go down to the L5-S1 space and started from where there was essentially original tissue planes and performed an L5 laminectomy, so that I could avoid inadvertent CSF penetration (dural leak).  COMPLICATIONS:  None.  CONDITION:  Stable.  OPERATIVE NOTE:  The patient was brought to the operating room, placed supine on the operating table.  After successful induction of general anesthesia and endotracheal intubation, TEDs, SCDs, and Foley were inserted.  The patient was turned prone onto the Wilson frame.  All  bony prominences were well padded and the back was prepped and draped in standard fashion.  Time-out was taken confirming patient, procedure, and all other pertinent important data.  Needles were placed at the proposed incision site and identified the L4-L5 disk space.  A portion of the previous incision was infiltrated with 0.25% Marcaine and this was reused.  Sharp dissection was carried out down to the deep fascia.  I stripped the deep fascia and then identified the L3 spinous process.  I then dissected the paraspinal muscles off this and used a Cobb elevator to completely expose the L3 spinous process.  Based on the preoperative MRI, the left side of L4 was still mostly intact.  I then proceeded down the left-hand side.  The L5 spinous process was intact based on the preoperative imaging CT.  I then exposed the L5 spinous process and again stripped the paraspinal muscles to expose the lamina and the facet complex.  At this point, I had a good bony landmarks above and below the previous incision site.  There was an exuberant amount of scar tissue noted.  I then began dissecting laterally until I exposed the L4-L5 facet complex.  Once this was done, I took my curette and began resecting the scar tissue as I proceeded medially along the intact lamina.  Once I had the portion of the L4 spinous process that was intact  based on the imaging studies, I was able to use a double-action Leksell rongeur to debride the bulk of the scar tissue.  At this point, once I had the posterolateral exposure done, I then identified the L5 lamina and took an x-ray to confirm this.  Once this was confirmed, I then elected to perform an L5 laminectomy.  After examining the L4-L5 space, there was a significant amount of scar tissue and will take a long period of time to gently and fully  develop a plane; however, by extending my decompression to L5, I essentially could identify the thecal sac and have the level  upon which I could then appropriately do my decompression with less risk to the thecal sac.  I elected to proceed with doing this.  Once I used 4-0 forward-angled Karlin curette to release the ligamentum flavum from the undersurface of the L5 lamina and then used 2 and 3 mm Kerrison punches to perform a very generous central laminotomy of L5.  I then carried this into the lateral recess.  There was actually some significant thickened ligamentum flavum that I was able to release.  I then identified the traversing S1 nerve root and traced this back towards the foramen.  At this point, I had a very adequate central and posterolateral decompression at L5-S1.  With the thecal sac level known, I was able to continue my decompression superiorly.  I then resected using a double-action Leksell rongeur even more of the scar tissue once I had the appropriate level.  However, once the majority of the L5 lamina was resected, I was running into scar tissue, and I could not complete this without knowing exactly where the thecal sac was at the L4-L5 space.  As such, I took a curette and then identified the intact L4 lamina and then began resecting the scar tissue as I slowly worked inferiorly.  Once I identified the leading edge of the previous laminotomy, I was able to release the scar tissue and perform an even more generous laminotomy of L4.  Once this was done, I now had exposed the thecal sac above and below the area of marked compression.  I then used a Penfield 4 and painstakingly began dissecting through the bulk of the scar tissue.  Once this was resected, I was able to remove the remaining portion of the L5 lamina and complete L5 laminectomy.  With this done, I was able to work into the lateral recess decompressing the thickened ligamentum flavum and exposing the medial border of the L5 pedicle.  I then identified the L5 nerve root and began decompressing this as it traveled towards the L5  foramen.  I then went superiorly into the lateral recess over the disk space again decompressing the area.  I then proceeded superiorly until I identified the L4 foramen and performed a generous foraminotomy.  At this point, I could easily take my [facility] and pass it superiorly along the thecal sac above the L4 pedicle, palpate the medial border of the L4 pedicle and to help visualize the L4 nerve root going into the foramen and passed my Surveyor, quantity at the foramen.  I could also freely pass my [facility] in the lateral recess.  I could visualize the L5 nerve root and traced it along the medial border of the pedicle and out the L5 foramen.  At this point, with one side completely decompressed, I went to the contralateral side and using the same technique, decompressed L4-L5 nerve roots  at this level.  At this point, I had thinned down the overlying scar tissue significantly, but in trying to get more of the scar tissue, I realized that it was quite adherent to the thecal sac.  I was concerned that too aggressive resection of the scar tissue would result in tear to the thecal sac, which would result in a CSF leak.  At this point, I had completely decompressed the L4-L5 and L5-S1 from above the L4 pedicle to about the level of the S1 pedicle.  This corresponded to the area of maximum compression seen on the CT myelogram.  I also decompressed from pedicle to pedicle, so I had a wide enough decompression channel.  The thecal sac had re-expanded. In addition, there was no further downward pressure on the scar tissue to cause further compression.  At this point, I elected not to proceed with further decompression.  I had the necessary nerve roots decompressed and thecal sac decompressed.  At this point, because of the underlying pre-existing spondylolisthesis, I elected to do an in situ fusion.  I decorticated the lateral aspect of the L4 and L5 facets and transverse  processes and packed the posterolateral gutter with autograft bone that I had harvested from the decompression and DBX mix.  I packed this into the lateral gutter, and I had good fixation.  I then obtained hemostasis using bipolar electrocautery and FloSeal.  I then irrigated the wound copiously with normal saline and then placed a thrombin-soaked Gelfoam patty over the exposed thecal sac and then placed a drain. Because of her age and the duration of surgery, I then placed vancomycin- impregnated beads to decrease the risk of postoperative infection into the wound and then closed the deep fascia with interrupted #1 Vicryl sutures.  I then closed superficial with 2-0 Vicryl sutures and a 3-0 Monocryl for the skin.  Steri-Strips and dry dressing were applied and the patient was ultimately extubated, transferred to PACU without incident. At the end of the case, all needle and sponge counts were correct. There were no adverse intraoperative events.     Brenda Solis, M.D.     DDB/MEDQ  D:  11/20/2014  T:  11/21/2014  Job:  [id number]

## 2014-11-21 NOTE — Anesthesia Postprocedure Evaluation (Signed)
  Anesthesia Post-op Note  Patient: Brenda Solis  Procedure(s) Performed: Procedure(s): REVISION L4-S1 DECOMPRESSION, L4-L5 IN SITU FUSION (LEVEL 1) (N/A)  Patient Location: PACU  Anesthesia Type:General  Level of Consciousness: awake  Airway and Oxygen Therapy: Patient Spontanous Breathing  Post-op Pain: mild  Post-op Assessment: Post-op Vital signs reviewed  Post-op Vital Signs: Reviewed  Last Vitals:  Filed Vitals:   11/21/14 0552  BP: 112/42  Pulse: 75  Temp: 36.8 C  Resp: 16    Complications: No apparent anesthesia complications

## 2014-11-21 NOTE — Progress Notes (Signed)
Utilization Review Completed.Donne Anon T5/26/2016

## 2014-11-22 MED ORDER — HYDROCODONE-ACETAMINOPHEN 10-325 MG PO TABS: 1.0000 | ORAL_TABLET | Freq: Four times a day (QID) | ORAL | Status: DC | PRN

## 2014-11-22 MED ORDER — ONDANSETRON HCL 4 MG PO TABS: 4.0000 mg | ORAL_TABLET | Freq: Three times a day (TID) | ORAL | Status: DC | PRN

## 2014-11-22 MED ORDER — METHOCARBAMOL 500 MG PO TABS: 500.0000 mg | ORAL_TABLET | Freq: Three times a day (TID) | ORAL | Status: DC | PRN

## 2014-11-22 NOTE — Progress Notes (Signed)
    Subjective: Procedure(s) (LRB): REVISION L4-S1 DECOMPRESSION, L4-L5 IN SITU FUSION (LEVEL 1) (N/A) 2 Days Post-Op  Patient reports pain as 2 on 0-10 scale.  Reports decreased leg pain reports incisional back pain   Positive void Positive bowel movement Positive flatus Negative chest pain or shortness of breath  Objective: Vital signs in last 24 hours: Temp:  [98.1 F (36.7 C)-98.5 F (36.9 C)] 98.5 F (36.9 C) (05/27 0051) Pulse Rate:  [66-70] 70 (05/27 0623) Resp:  [16-18] 18 (05/27 0623) BP: (113-132)/(39-43) 113/40 mmHg (05/27 0625) SpO2:  [93 %-100 %] 93 % (05/27 0623)  Intake/Output from previous day: 05/26 0701 - 05/27 0700 In: 600 [P.O.:600] Out: 165 [Drains:165]  Labs: No results for input(s): WBC, RBC, HCT, PLT in the last 72 hours. No results for input(s): NA, K, CL, CO2, BUN, CREATININE, GLUCOSE, CALCIUM in the last 72 hours. No results for input(s): LABPT, INR in the last 72 hours.  Physical Exam: Neurologically intact ABD soft Intact pulses distally Incision: dressing C/D/I and no drainage Compartment soft  Assessment/Plan: Patient stable  xrays n/a Continue mobilization with physical therapy Continue care  Advance diet Up with therapy Discharge to SNF when bed is ready  Melina Schools, MD [place] [phone]

## 2014-11-22 NOTE — Progress Notes (Signed)
Physical Therapy Treatment Patient Details Name: Brenda Solis MRN: [id number] DOB: [birth year] Today's Date: 11/22/2014    History of Present Illness s/p decompressive lumbar laminectomy L4-S1    PT Comments    Patient is progressing well with ambulation and overall mobility. After speaking with patient and MD, family does not have adequate support at home and would benefit from SNF for ongoing therapy to regain functional independence prior to returning home where she lives alone. Patient and family in agreement. Will update CSW and Case Manager.   Follow Up Recommendations  SNF     Equipment Recommendations  None recommended by PT    Recommendations for Other Services       Precautions / Restrictions Precautions Precautions: Back Precaution Comments: Pt able to state all 3 back precautions when asked Required Braces or Orthoses: Spinal Brace Spinal Brace: Lumbar corset;Applied in sitting position    Mobility  Bed Mobility     Rolling: Supervision Sidelying to sit: Supervision (with rails)       General bed mobility comments: Cues for proper technique  Transfers Overall transfer level: Needs assistance Equipment used: Rolling walker (2 wheeled)   Sit to Stand: Min guard         General transfer comment: Cues for safe hand placement  Ambulation/Gait Ambulation/Gait assistance: Min guard Ambulation Distance (Feet): 110 Feet Assistive device: Rolling walker (2 wheeled) Gait Pattern/deviations: Step-through pattern;Decreased stride length   Gait velocity interpretation: Below normal speed for age/gender General Gait Details: Cues for upright posture and RW safety   Stairs            Wheelchair Mobility    Modified Rankin (Stroke Patients Only)       Balance                                    Cognition Arousal/Alertness: Awake/alert Behavior During Therapy: WFL for tasks assessed/performed Overall Cognitive Status: Within  Functional Limits for tasks assessed                      Exercises      General Comments        Pertinent Vitals/Pain Pain Score: 4  Pain Location: back Pain Descriptors / Indicators: Sore Pain Intervention(s): Monitored during session    Home Living                      Prior Function            PT Goals (current goals can now be found in the care plan section) Progress towards PT goals: Progressing toward goals    Frequency  Min 5X/week    PT Plan Discharge plan needs to be updated    Co-evaluation             End of Session Equipment Utilized During Treatment: Back brace Activity Tolerance: Patient tolerated treatment well Patient left: in chair;with call bell/phone within reach     Time: 0756-0815 PT Time Calculation (min) (ACUTE ONLY): 19 min  Charges:  $Gait Training: 8-22 mins                    G Codes:      Jacqualyn Posey 11/22/2014, 8:23 AM  11/22/2014 Jacqualyn Posey PTA [phone] pager [phone] office

## 2014-11-22 NOTE — Clinical Social Work Placement (Signed)
   CLINICAL SOCIAL WORK PLACEMENT  NOTE  Date:  11/22/2014  Patient Details  Name: Brenda Solis MRN: [id number] Date of Birth: 08-May-1935  Clinical Social Work is seeking post-discharge placement for this patient at the [place] level of care (*CSW will initial, date and re-position this form in  chart as items are completed):  Yes   Patient/family provided with [facility] Work Department's list of facilities offering this level of care within the geographic area requested by the patient (or if unable, by the patient's family).  Yes   Patient/family informed of their freedom to choose among providers that offer the needed level of care, that participate in Medicare, Medicaid or managed care program needed by the patient, have an available bed and are willing to accept the patient.  Yes   Patient/family informed of [facility]'s ownership interest in [facility] and [facility], as well as of the fact that they are under no obligation to receive care at these facilities.  PASRR submitted to EDS on 11/22/14     PASRR number received on 11/22/14     Existing PASRR number confirmed on       FL2 transmitted to all facilities in geographic area requested by pt/family on 11/22/14     FL2 transmitted to all facilities within larger geographic area on       Patient informed that his/her managed care company has contracts with or will negotiate with certain facilities, including the following:            Patient/family informed of bed offers received.  Patient chooses bed at       Physician recommends and patient chooses bed at      Patient to be transferred to   on  .  Patient to be transferred to facility by       Patient family notified on   of transfer.  Name of family member notified:        PHYSICIAN Please sign FL2     Additional Comment:    _______________________________________________ Caroline Sauger, LCSW 11/22/2014, 4:00  PM [phone]

## 2014-11-22 NOTE — Progress Notes (Signed)
Occupational Therapy Treatment Patient Details Name: Brenda Solis MRN: [id number] DOB: 06/02/1935 Today's Date: 11/22/2014    History of present illness s/p decompressive lumbar laminectomy L4-S1   OT comments  This 80 yo female progressing, but as she needs to to be able to be at home by herself intermittently. Plan now recommended is [facility] SNF.  Follow Up Recommendations  SNF    Equipment Recommendations  None recommended by OT       Precautions / Restrictions Precautions Precautions: Back Precaution Comments: Pt able to state all 3 back precautions when asked Required Braces or Orthoses: Spinal Brace Spinal Brace: Lumbar corset;Applied in sitting position Restrictions Weight Bearing Restrictions: No       Mobility Bed Mobility     Rolling: Supervision Sidelying to sit: Supervision (with rails)       General bed mobility comments: Pt up in recliner upon my arrival  Transfers Overall transfer level: Needs assistance Equipment used: Rolling walker (2 wheeled) Transfers: Sit to/from Stand Sit to Stand: Supervision         General transfer comment: Cues for safe hand placement        ADL Overall ADL's : Needs assistance/impaired     Grooming: Set up;Supervision/safety;Standing                           Tub/ Shower Transfer: Tub transfer;Min guard;Ambulation;Rolling walker (side step)                      Cognition   Behavior During Therapy: WFL for tasks assessed/performed Overall Cognitive Status: Within Functional Limits for tasks assessed                                    Pertinent Vitals/ Pain       Pain Assessment: 0-10 Pain Score: 2  Pain Location: back Pain Descriptors / Indicators: Aching;Sore Pain Intervention(s): Monitored during session         Frequency Min 2X/week     Progress Toward Goals  OT Goals(current goals can now be found in the care plan section)  Progress towards OT goals:  Progressing toward goals     Plan Discharge plan needs to be updated       End of Session Equipment Utilized During Treatment: Rolling walker;Back brace   Activity Tolerance Patient tolerated treatment well   Patient Left in chair;with call bell/phone within reach           Time: 2800-3491 OT Time Calculation (min): 20 min  Charges: OT General Charges $OT Visit: 1 Procedure OT Treatments $Self Care/Home Management : 8-22 mins  Almon Register [phone] 11/22/2014, 10:08 AM

## 2014-11-22 NOTE — Clinical Social Work Note (Signed)
Clinical Social Work Assessment  Patient Details  Name: Brenda Solis MRN: [id number] Date of Birth: Dec 20, 1934  Date of referral:  11/22/14               Reason for consult:  Discharge Planning, Facility Placement                Permission sought to share information with:  Family Supports Permission granted to share information::  Yes, Verbal Permission Granted  Name::     Amy Rowe Robert, [place]::     Relationship::  Daughter, Son  Sport and exercise psychologist Information:     Housing/Transportation Living arrangements for the past 2 months:  Single Family Home Source of Information:  Patient Patient Interpreter Needed:  None Criminal Activity/Legal Involvement Pertinent to Current Situation/Hospitalization:  No - Comment as needed Significant Relationships:  Adult Children Lives with:  Self Do you feel safe going back to the place where you live?  No (High fall risk.) Need for family participation in patient care:  Yes (Comment) (Patient's children active in patient's care.)  Care giving concerns:  Patient expressed no concerns at this time.   Social Worker assessment / plan:  CSW received referral for possible SNF placement at time of discharge. CSW met with patient and patient's children at bedside. Patient stated patient understanding and agreeable to PT recommendation for SNF placement. Per patient, patient would prefer to discharge to [facility]. Patient reports patient lives home alone with patient's three children living close in proximity. CSW to continue to follow and assist with discharge planning needs.  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  Patient/Family's Understanding of and Emotional Response  to Diagnosis, Current Treatment, and Prognosis:  Patient understanding and agreeable to CSW plan of care.  Emotional Assessment Appearance:  Appears stated age Attitude/Demeanor/Rapport:  Other (Pleasant) Affect (typically observed):  Appropriate, Pleasant, Accepting Orientation:  Oriented to Self, Oriented to Place, Oriented to  Time, Oriented to Situation Alcohol / Substance use:  Not Applicable Psych involvement (Current and /or in the community):  No (Comment) (Not appropriate on this admission.)  Discharge Needs  Concerns to be addressed:  No discharge needs identified Readmission within the last 30 days:  No Current discharge risk:  None Barriers to Discharge:  No Barriers Identified   Caroline Sauger, LCSW 11/22/2014, 3:58 PM [phone]

## 2014-11-23 DIAGNOSIS — M549 Dorsalgia, unspecified: Secondary | ICD-10-CM | POA: Diagnosis not present

## 2014-11-23 DIAGNOSIS — M4806 Spinal stenosis, lumbar region: Secondary | ICD-10-CM | POA: Diagnosis not present

## 2014-11-23 DIAGNOSIS — E119 Type 2 diabetes mellitus without complications: Secondary | ICD-10-CM | POA: Diagnosis not present

## 2014-11-23 DIAGNOSIS — E039 Hypothyroidism, unspecified: Secondary | ICD-10-CM | POA: Diagnosis not present

## 2014-11-23 DIAGNOSIS — I10 Essential (primary) hypertension: Secondary | ICD-10-CM | POA: Diagnosis not present

## 2014-11-23 DIAGNOSIS — Z981 Arthrodesis status: Secondary | ICD-10-CM | POA: Diagnosis not present

## 2014-11-23 DIAGNOSIS — F418 Other specified anxiety disorders: Secondary | ICD-10-CM | POA: Diagnosis not present

## 2014-11-23 DIAGNOSIS — E079 Disorder of thyroid, unspecified: Secondary | ICD-10-CM | POA: Diagnosis not present

## 2014-11-23 DIAGNOSIS — F329 Major depressive disorder, single episode, unspecified: Secondary | ICD-10-CM | POA: Diagnosis not present

## 2014-11-23 DIAGNOSIS — E785 Hyperlipidemia, unspecified: Secondary | ICD-10-CM | POA: Diagnosis not present

## 2014-11-23 DIAGNOSIS — F39 Unspecified mood [affective] disorder: Secondary | ICD-10-CM | POA: Diagnosis not present

## 2014-11-23 DIAGNOSIS — Z4789 Encounter for other orthopedic aftercare: Secondary | ICD-10-CM | POA: Diagnosis not present

## 2014-11-23 NOTE — Progress Notes (Addendum)
CSW provided list of bed offers for SNF facilities.   Pt/Family prefers:  [facility]  CSW will contact the facility for d/c planning.      [facility]  2S, 72M,3S, 5N, 6N [phone]

## 2014-11-23 NOTE — Progress Notes (Signed)
Pt to be d/c today to [facility].   Pt and family agreeable. Confirmed plans with facility. Administrator was able to come in for completion of admission paperwork.   Plan transfer via EMS.     [facility]  2S, 77M,3S, 5N, 6N [phone]

## 2014-11-23 NOTE — Discharge Summary (Signed)
Physician Discharge Summary   Patient ID: Brenda Solis MRN: [id number] DOB/AGE: 78-Jun-1936 78 y.o.  Admit date: 11/20/2014 Discharge date: tentative 11/23/2014  Primary Diagnosis: Spinal stenosis   Admission Diagnoses:  Past Medical History  Diagnosis Date  . Thyroid disease     hypothyroidism  . Insomnia, unspecified   . Spondylosis of unspecified site without mention of myelopathy   . Hypertension   . Hyperlipidemia   . Depression   . Heart murmur   . Hypothyroidism    Discharge Diagnoses:   Active Problems:   Spinal stenosis  Estimated body mass index is 29.05 kg/(m^2) as calculated from the following:   Height as of 11/15/14: 5' 8"  (1.727 m).   Weight as of this encounter: 86.637 kg (191 lb).  Procedure:  Procedure(s) (LRB): REVISION L4-S1 DECOMPRESSION, L4-L5 IN SITU FUSION (LEVEL 1) (N/A)   Consults: None  HPI: The patient reports low back symptoms including pain and bil leg pain which began year(s) ago. Symptoms are reported to be located symmetrically and Symptoms include pain, muscle spasm, stiffness, weakness and pain in the calf. The pain radiates to the left buttock, left posterior thigh, left posterior lower leg, left foot, right buttock, right posterior thigh, right posterior lower leg and right foot. The patient describes the pain as throbbing. The patient describes the severity of their symptoms as severe. The patient feels as if the symptoms are unchanging. Symptoms are exacerbated by standing. Symptoms are relieved by recumbency, activity modification, nonsteroidal anti-inflammatory drugs and non-opioid analgesics. Current treatment includes nonsteroidal anti-inflammatory drugs, opioid analgesics and muscle relaxants. Pertinent medical history includes previous back surgery and spinal stenosis. Prior to being seen today the patient was previously evaluated Dr. Gladstone Lighter. Symptoms present at the patient's previous evaluation included back pain and decreased range of  motion. Past evaluation has included CT of the lumbar spine and MRI of the lumbar spine. Past treatment has included nonsteroidal anti-inflammatory drugs, opioid analgesics, muscle relaxants, corticosteroids and back surgery.  Laboratory Data: Admission on 11/20/2014  Component Date Value Ref Range Status  . ABO/RH(D) 11/20/2014 A POS   Final  . Antibody Screen 11/20/2014 NEG   Final  . Sample Expiration 11/20/2014 11/23/2014   Final  Hospital Outpatient Visit on 11/15/2014  Component Date Value Ref Range Status  . MRSA, PCR 11/15/2014 NEGATIVE  NEGATIVE Final  . Staphylococcus aureus 11/15/2014 NEGATIVE  NEGATIVE Final   Comment:        The Xpert SA Assay (FDA approved for NASAL specimens in patients over 71 years of age), is one component of a comprehensive surveillance program.  Test performance has been validated by [facility] for patients greater than or equal to 53 year old. It is not intended to diagnose infection nor to guide or monitor treatment.   . Sodium 11/15/2014 137  135 - 145 mmol/L Final  . Potassium 11/15/2014 4.3  3.5 - 5.1 mmol/L Final  . Chloride 11/15/2014 106  101 - 111 mmol/L Final  . CO2 11/15/2014 22  22 - 32 mmol/L Final  . Glucose, Bld 11/15/2014 85  65 - 99 mg/dL Final  . BUN 11/15/2014 18  6 - 20 mg/dL Final  . Creatinine, Ser 11/15/2014 0.95  0.44 - 1.00 mg/dL Final  . Calcium 11/15/2014 9.8  8.9 - 10.3 mg/dL Final  . GFR calc non Af Amer 11/15/2014 56* >60 mL/min Final  . GFR calc Af Amer 11/15/2014 >60  >60 mL/min Final   Comment: (NOTE) The eGFR has been  calculated using the CKD EPI equation. This calculation has not been validated in all clinical situations. eGFR's persistently <60 mL/min signify possible Chronic Kidney Disease.   . Anion gap 11/15/2014 9  5 - 15 Final  . WBC 11/15/2014 8.2  4.0 - 10.5 K/uL Final  . RBC 11/15/2014 4.37  3.87 - 5.11 MIL/uL Final  . Hemoglobin 11/15/2014 13.1  12.0 - 15.0 g/dL Final  . HCT 11/15/2014 39.0   36.0 - 46.0 % Final  . MCV 11/15/2014 89.2  78.0 - 100.0 fL Final  . MCH 11/15/2014 30.0  26.0 - 34.0 pg Final  . MCHC 11/15/2014 33.6  30.0 - 36.0 g/dL Final  . RDW 11/15/2014 13.4  11.5 - 15.5 % Final  . Platelets 11/15/2014 178  150 - 400 K/uL Final  . ABO/RH(D) 11/15/2014 A POS   Final  . Antibody Screen 11/15/2014 POS   Final  . Sample Expiration 11/15/2014 11/29/2014   Final  . Antibody Identification 50/02/3817 NO CLINICALLY SIGNIFICANT ANTIBODY IDENTIFIED   Final  . PT AG Type 11/15/2014 POSITIVE FOR KIDD A ANTIGEN   Final  . DAT, IgG 11/15/2014 NEG   Final  . Unit Number 11/15/2014 [id number]   Final  . Blood Component Type 11/15/2014 RED CELLS,LR   Final  . Unit division 11/15/2014 00   Final  . Status of Unit 11/15/2014 ALLOCATED   Final  . Transfusion Status 11/15/2014 OK TO TRANSFUSE   Final  . Crossmatch Result 11/15/2014 COMPATIBLE   Final  . Unit Number 11/15/2014 [id number]   Final  . Blood Component Type 11/15/2014 RED CELLS,LR   Final  . Unit division 11/15/2014 00   Final  . Status of Unit 11/15/2014 ALLOCATED   Final  . Transfusion Status 11/15/2014 OK TO TRANSFUSE   Final  . Crossmatch Result 11/15/2014 COMPATIBLE   Final     X-Rays:Dg Lumbar Spine 2-3 Views  11/20/2014   CLINICAL DATA:  Lumbar spine surgery.  EXAM: LUMBAR SPINE - 2-3 VIEW  COMPARISON:  10/01/2014.  FINDINGS: Lumbar vertebra numbered as per prior CT of 10/01/2014. Stable L4-L5 mild anterolisthesis. Metallic probes noted along the posterior aspect of L3 and L5.  IMPRESSION: Metallic probes noted along the posterior aspect of L3 and L5.   Electronically Signed   By: Decker   On: 11/20/2014 13:56   Dg Spine Portable 1 View  11/20/2014   CLINICAL DATA:  Revision L4-5 decompression. Dr. Rolena Infante is aware that he is at L5-S1 at the time of imaging.  EXAM: PORTABLE SPINE - 1 VIEW  COMPARISON:  11/20/2014 at 0908 hr and CT lumbar spine 10/01/2014.  FINDINGS: Single intraoperative cross-table  lateral view of the lumbar spine, labeled "#2," is submitted. Numbering system utilized on 10/01/2014 is preserved. Surgical instrument tip projects posterior to the L5-S1 interspace. Degenerative changes are seen in the spine, worst at L1-2 and L5-S1.  IMPRESSION: Intraoperative localization at L5-S1.   Electronically Signed   By: Lorin Picket M.D.   On: 11/20/2014 10:05    EKG: Orders placed or performed in visit on 11/05/14  . EKG 12-Lead     Hospital Course: Brenda Solis is a 80 y.o. who was admitted to [facility]. They were brought to the operating room on 11/20/2014 and underwent Procedure(s): REVISION L4-S1 DECOMPRESSION, L4-L5 IN SITU FUSION (LEVEL 1).  Patient tolerated the procedure well and was later transferred to the recovery room and then to the orthopaedic floor for postoperative care.  They  were given PO and IV analgesics for pain control following their surgery.  They were given 24 hours of postoperative antibiotics of  Anti-infectives    Start     Dose/Rate Route Frequency Ordered Stop   11/20/14 1800  ceFAZolin (ANCEF) IVPB 1 g/50 mL premix     1 g 100 mL/hr over 30 Minutes Intravenous Every 8 hours 11/20/14 1635 11/21/14 0239   11/20/14 1137  vancomycin (VANCOCIN) powder  Status:  Discontinued       As needed 11/20/14 1138 11/20/14 1314   11/20/14 0715  ceFAZolin (ANCEF) IVPB 2 g/50 mL premix     2 g 100 mL/hr over 30 Minutes Intravenous To ShortStay Surgical 11/19/14 1316 11/20/14 0844    PT and OT were ordered. Discharge planning consulted to help with postop disposition and equipment needs.  Patient had a fair night on the evening of surgery.  They started to get up OOB with therapy on day one. Hemovac drain was pulled without difficulty.  Continued to work with therapy into day two.  Dressing was changed on day three and the incision was clean and dry. At time of discharge summary patient was tentative DC home vs SNF based on progression with PT.    Diet:  Regular diet Activity:WBAT Follow-up:in 2 weeks Disposition - Skilled nursing facility vs Home Discharged Condition: stable      Medication List    STOP taking these medications        HYDROcodone-acetaminophen 5-325 MG per tablet  Commonly known as:  NORCO/VICODIN  Replaced by:  HYDROcodone-acetaminophen 10-325 MG per tablet     meloxicam 15 MG tablet  Commonly known as:  MOBIC     traMADol 50 MG tablet  Commonly known as:  ULTRAM      TAKE these medications        ALPRAZolam 0.25 MG tablet  Commonly known as:  XANAX  TAKE ONE TABLET BY MOUTH AT BEDTIME AS NEEDED     ASPERCREME W/LIDOCAINE EX  Apply 1 application topically as needed (pain).     atorvastatin 40 MG tablet  Commonly known as:  LIPITOR  TAKE ONE TABLET BY MOUTH AT BEDTIME     cholecalciferol 1000 UNITS tablet  Commonly known as:  VITAMIN D  Take 2,000 Units by mouth daily.     citalopram 40 MG tablet  Commonly known as:  CELEXA  TAKE ONE TABLET BY MOUTH ONE TIME DAILY     HYDROcodone-acetaminophen 10-325 MG per tablet  Commonly known as:  NORCO  Take 1 tablet by mouth every 6 (six) hours as needed (hold for sedation).     levothyroxine 25 MCG tablet  Commonly known as:  SYNTHROID, LEVOTHROID  Take 1 tablet (25 mcg total) by mouth daily before breakfast.     meclizine 25 MG tablet  Commonly known as:  ANTIVERT  TAKE ONE TABLET BY MOUTH THREE TIMES DAILY AS NEEDED FOR DIZZINESS     methocarbamol 500 MG tablet  Commonly known as:  ROBAXIN  Take 1 tablet (500 mg total) by mouth 3 (three) times daily as needed for muscle spasms (hold for sedation).     metoprolol succinate 25 MG 24 hr tablet  Commonly known as:  TOPROL-XL  Take 1 tablet (25 mg total) by mouth every morning.     ondansetron 4 MG tablet  Commonly known as:  ZOFRAN  Take 1 tablet (4 mg total) by mouth every 8 (eight) hours as needed for nausea or vomiting.  risperiDONE 1 MG tablet  Commonly known as:  RISPERDAL  Take 0.5  tablets (0.5 mg total) by mouth at bedtime.     vitamin C 500 MG tablet  Commonly known as:  ASCORBIC ACID  Take 500 mg by mouth daily.     zolpidem 10 MG tablet  Commonly known as:  AMBIEN  Take 1 tablet (10 mg total) by mouth at bedtime.           Follow-up Information    Follow up with Dahlia Bailiff, MD. Schedule an appointment as soon as possible for a visit in 2 weeks.   Specialty:  Orthopedic Surgery   Why:  If symptoms worsen, For suture removal, For wound re-check   Contact information:   [address] [place] [address] [phone]       Signed: Ardeen Jourdain, PA-C Orthopaedic Surgery 11/23/2014, 10:04 AM

## 2014-11-23 NOTE — Progress Notes (Signed)
   Subjective: 3 Days Post-Op Procedure(s) (LRB): REVISION L4-S1 DECOMPRESSION, L4-L5 IN SITU FUSION (LEVEL 1) (N/A) Patient reports pain as mild.   Patient seen in rounds for Dr. Rolena Infante. Patient is well, and has had no acute complaints or problems other than soreness in her back. She feels that she is improving. No issues overnight. No SOB or chest pain. Would like to go home if possible but is open to SNF if needed.    Objective: Vital signs in last 24 hours: Temp:  [98 F (36.7 C)-100.1 F (37.8 C)] 98 F (36.7 C) (05/28 0645) Pulse Rate:  [61-71] 63 (05/28 0857) Resp:  [17-18] 17 (05/28 0645) BP: (114-126)/(39-59) 126/59 mmHg (05/28 0857) SpO2:  [94 %-97 %] 95 % (05/28 0645)  Intake/Output from previous day:  Intake/Output Summary (Last 24 hours) at 11/23/14 1001 Last data filed at 11/23/14 0900  Gross per 24 hour  Intake    720 ml  Output      0 ml  Net    720 ml    Intake/Output this shift: Total I/O In: 240 [P.O.:240] Out: -    EXAM General - Patient is Alert and Oriented Extremity - Neurologically intact Dorsiflexion/Plantar flexion intact Dressing/Incision - clean, dry, no drainage Motor Function - intact, moving foot and toes well on exam.   Past Medical History  Diagnosis Date  . Thyroid disease     hypothyroidism  . Insomnia, unspecified   . Spondylosis of unspecified site without mention of myelopathy   . Hypertension   . Hyperlipidemia   . Depression   . Heart murmur   . Hypothyroidism     Assessment/Plan: 3 Days Post-Op Procedure(s) (LRB): REVISION L4-S1 DECOMPRESSION, L4-L5 IN SITU FUSION (LEVEL 1) (N/A) Active Problems:   Spinal stenosis  Estimated body mass index is 29.05 kg/(m^2) as calculated from the following:   Height as of 11/15/14: 5\' 8"  (1.727 m).   Weight as of this encounter: 86.637 kg (191 lb). Advance diet Up with therapy D/C IV fluids when tolerating POs well  Continue PT today. Possible DC home vs SNF today. If therapy  goes well and the patient and her family agree, she can DC home today. If the progression is not there, she may need to hold off for SNF DC.  Ardeen Jourdain, PA-C Orthopaedic Surgery 11/23/2014, 10:01 AM

## 2014-11-23 NOTE — Progress Notes (Signed)
Called report to [facility]. Gave report to receiving RN.

## 2014-11-23 NOTE — Progress Notes (Signed)
CSW spoke with the Pt's nurse concerning decision for SNF vs HHPT. Pt and family would like SNF placement.   CSW contacted [facility] and admissions coordinator cannot assist with paperwork until Monday. CSW will speak with family and Pt concerning choosing a different facility.    [facility]  2S, 15M,3S, 5N, 6N [phone]

## 2014-11-26 DIAGNOSIS — F418 Other specified anxiety disorders: Secondary | ICD-10-CM | POA: Diagnosis not present

## 2014-11-26 DIAGNOSIS — E039 Hypothyroidism, unspecified: Secondary | ICD-10-CM | POA: Diagnosis not present

## 2014-11-26 DIAGNOSIS — M4806 Spinal stenosis, lumbar region: Secondary | ICD-10-CM | POA: Diagnosis not present

## 2014-11-26 DIAGNOSIS — Z981 Arthrodesis status: Secondary | ICD-10-CM | POA: Diagnosis not present

## 2014-11-26 DIAGNOSIS — E119 Type 2 diabetes mellitus without complications: Secondary | ICD-10-CM | POA: Diagnosis not present

## 2014-11-27 LAB — TYPE AND SCREEN
ABO/RH(D): A POS
ANTIBODY SCREEN: POSITIVE
DAT, IgG: NEGATIVE
PT AG Type: POSITIVE
Unit division: 0
Unit division: 0

## 2014-12-04 DIAGNOSIS — I10 Essential (primary) hypertension: Secondary | ICD-10-CM | POA: Diagnosis not present

## 2014-12-04 DIAGNOSIS — F328 Other depressive episodes: Secondary | ICD-10-CM | POA: Diagnosis not present

## 2014-12-04 DIAGNOSIS — Z4789 Encounter for other orthopedic aftercare: Secondary | ICD-10-CM | POA: Diagnosis not present

## 2014-12-04 DIAGNOSIS — E785 Hyperlipidemia, unspecified: Secondary | ICD-10-CM | POA: Diagnosis not present

## 2014-12-04 DIAGNOSIS — E039 Hypothyroidism, unspecified: Secondary | ICD-10-CM | POA: Diagnosis not present

## 2014-12-04 DIAGNOSIS — M4806 Spinal stenosis, lumbar region: Secondary | ICD-10-CM | POA: Diagnosis not present

## 2014-12-05 DIAGNOSIS — Z4789 Encounter for other orthopedic aftercare: Secondary | ICD-10-CM | POA: Diagnosis not present

## 2014-12-05 DIAGNOSIS — F328 Other depressive episodes: Secondary | ICD-10-CM | POA: Diagnosis not present

## 2014-12-05 DIAGNOSIS — E039 Hypothyroidism, unspecified: Secondary | ICD-10-CM | POA: Diagnosis not present

## 2014-12-05 DIAGNOSIS — E785 Hyperlipidemia, unspecified: Secondary | ICD-10-CM | POA: Diagnosis not present

## 2014-12-05 DIAGNOSIS — I10 Essential (primary) hypertension: Secondary | ICD-10-CM | POA: Diagnosis not present

## 2014-12-05 DIAGNOSIS — M4806 Spinal stenosis, lumbar region: Secondary | ICD-10-CM | POA: Diagnosis not present

## 2014-12-07 DIAGNOSIS — F328 Other depressive episodes: Secondary | ICD-10-CM | POA: Diagnosis not present

## 2014-12-07 DIAGNOSIS — E039 Hypothyroidism, unspecified: Secondary | ICD-10-CM | POA: Diagnosis not present

## 2014-12-07 DIAGNOSIS — M4806 Spinal stenosis, lumbar region: Secondary | ICD-10-CM | POA: Diagnosis not present

## 2014-12-07 DIAGNOSIS — Z4789 Encounter for other orthopedic aftercare: Secondary | ICD-10-CM | POA: Diagnosis not present

## 2014-12-07 DIAGNOSIS — E785 Hyperlipidemia, unspecified: Secondary | ICD-10-CM | POA: Diagnosis not present

## 2014-12-07 DIAGNOSIS — I10 Essential (primary) hypertension: Secondary | ICD-10-CM | POA: Diagnosis not present

## 2014-12-10 DIAGNOSIS — E785 Hyperlipidemia, unspecified: Secondary | ICD-10-CM | POA: Diagnosis not present

## 2014-12-10 DIAGNOSIS — F328 Other depressive episodes: Secondary | ICD-10-CM | POA: Diagnosis not present

## 2014-12-10 DIAGNOSIS — Z4789 Encounter for other orthopedic aftercare: Secondary | ICD-10-CM | POA: Diagnosis not present

## 2014-12-10 DIAGNOSIS — M4806 Spinal stenosis, lumbar region: Secondary | ICD-10-CM | POA: Diagnosis not present

## 2014-12-10 DIAGNOSIS — E039 Hypothyroidism, unspecified: Secondary | ICD-10-CM | POA: Diagnosis not present

## 2014-12-10 DIAGNOSIS — I10 Essential (primary) hypertension: Secondary | ICD-10-CM | POA: Diagnosis not present

## 2014-12-12 DIAGNOSIS — E039 Hypothyroidism, unspecified: Secondary | ICD-10-CM | POA: Diagnosis not present

## 2014-12-12 DIAGNOSIS — M4806 Spinal stenosis, lumbar region: Secondary | ICD-10-CM | POA: Diagnosis not present

## 2014-12-12 DIAGNOSIS — E785 Hyperlipidemia, unspecified: Secondary | ICD-10-CM | POA: Diagnosis not present

## 2014-12-12 DIAGNOSIS — I10 Essential (primary) hypertension: Secondary | ICD-10-CM | POA: Diagnosis not present

## 2014-12-12 DIAGNOSIS — F328 Other depressive episodes: Secondary | ICD-10-CM | POA: Diagnosis not present

## 2014-12-12 DIAGNOSIS — Z4789 Encounter for other orthopedic aftercare: Secondary | ICD-10-CM | POA: Diagnosis not present

## 2014-12-16 DIAGNOSIS — E039 Hypothyroidism, unspecified: Secondary | ICD-10-CM | POA: Diagnosis not present

## 2014-12-16 DIAGNOSIS — F328 Other depressive episodes: Secondary | ICD-10-CM | POA: Diagnosis not present

## 2014-12-16 DIAGNOSIS — Z4789 Encounter for other orthopedic aftercare: Secondary | ICD-10-CM | POA: Diagnosis not present

## 2014-12-16 DIAGNOSIS — I10 Essential (primary) hypertension: Secondary | ICD-10-CM | POA: Diagnosis not present

## 2014-12-16 DIAGNOSIS — E785 Hyperlipidemia, unspecified: Secondary | ICD-10-CM | POA: Diagnosis not present

## 2014-12-16 DIAGNOSIS — M4806 Spinal stenosis, lumbar region: Secondary | ICD-10-CM | POA: Diagnosis not present

## 2014-12-17 ENCOUNTER — Other Ambulatory Visit: Payer: Self-pay | Admitting: Nurse Practitioner

## 2014-12-17 NOTE — Telephone Encounter (Signed)
LAST SEEN 11/05/14 mmm   If approved route to nurse to call into [facility]

## 2014-12-17 NOTE — Telephone Encounter (Signed)
Please call in xanax with 1 refills 

## 2014-12-18 DIAGNOSIS — M4806 Spinal stenosis, lumbar region: Secondary | ICD-10-CM | POA: Diagnosis not present

## 2014-12-18 DIAGNOSIS — E039 Hypothyroidism, unspecified: Secondary | ICD-10-CM | POA: Diagnosis not present

## 2014-12-18 DIAGNOSIS — E785 Hyperlipidemia, unspecified: Secondary | ICD-10-CM | POA: Diagnosis not present

## 2014-12-18 DIAGNOSIS — F328 Other depressive episodes: Secondary | ICD-10-CM | POA: Diagnosis not present

## 2014-12-18 DIAGNOSIS — I10 Essential (primary) hypertension: Secondary | ICD-10-CM | POA: Diagnosis not present

## 2014-12-18 DIAGNOSIS — Z4789 Encounter for other orthopedic aftercare: Secondary | ICD-10-CM | POA: Diagnosis not present

## 2014-12-18 NOTE — Telephone Encounter (Signed)
rx called into pharmacy

## 2014-12-19 DIAGNOSIS — E785 Hyperlipidemia, unspecified: Secondary | ICD-10-CM | POA: Diagnosis not present

## 2014-12-19 DIAGNOSIS — M4806 Spinal stenosis, lumbar region: Secondary | ICD-10-CM | POA: Diagnosis not present

## 2014-12-19 DIAGNOSIS — F328 Other depressive episodes: Secondary | ICD-10-CM | POA: Diagnosis not present

## 2014-12-19 DIAGNOSIS — I10 Essential (primary) hypertension: Secondary | ICD-10-CM | POA: Diagnosis not present

## 2014-12-19 DIAGNOSIS — Z4789 Encounter for other orthopedic aftercare: Secondary | ICD-10-CM | POA: Diagnosis not present

## 2014-12-19 DIAGNOSIS — E039 Hypothyroidism, unspecified: Secondary | ICD-10-CM | POA: Diagnosis not present

## 2014-12-20 ENCOUNTER — Telehealth: Payer: Self-pay | Admitting: Nurse Practitioner

## 2014-12-20 NOTE — Telephone Encounter (Signed)
Appt given per patient request 

## 2014-12-24 ENCOUNTER — Encounter: Payer: Self-pay | Admitting: Nurse Practitioner

## 2014-12-24 ENCOUNTER — Ambulatory Visit (INDEPENDENT_AMBULATORY_CARE_PROVIDER_SITE_OTHER): Payer: Medicare Other | Admitting: Nurse Practitioner

## 2014-12-24 VITALS — BP 133/61 | HR 59 | Temp 97.4°F | Ht 68.0 in | Wt 188.0 lb

## 2014-12-24 DIAGNOSIS — I10 Essential (primary) hypertension: Secondary | ICD-10-CM | POA: Diagnosis not present

## 2014-12-24 DIAGNOSIS — E039 Hypothyroidism, unspecified: Secondary | ICD-10-CM

## 2014-12-24 DIAGNOSIS — E785 Hyperlipidemia, unspecified: Secondary | ICD-10-CM | POA: Diagnosis not present

## 2014-12-24 DIAGNOSIS — G4700 Insomnia, unspecified: Secondary | ICD-10-CM | POA: Diagnosis not present

## 2014-12-24 DIAGNOSIS — F329 Major depressive disorder, single episode, unspecified: Secondary | ICD-10-CM | POA: Diagnosis not present

## 2014-12-24 DIAGNOSIS — F39 Unspecified mood [affective] disorder: Secondary | ICD-10-CM | POA: Diagnosis not present

## 2014-12-24 DIAGNOSIS — M47816 Spondylosis without myelopathy or radiculopathy, lumbar region: Secondary | ICD-10-CM

## 2014-12-24 DIAGNOSIS — F32A Depression, unspecified: Secondary | ICD-10-CM

## 2014-12-24 MED ORDER — METOPROLOL SUCCINATE ER 25 MG PO TB24: 25.0000 mg | ORAL_TABLET | Freq: Every morning | ORAL | Status: DC

## 2014-12-24 MED ORDER — ZOLPIDEM TARTRATE 10 MG PO TABS: 10.0000 mg | ORAL_TABLET | Freq: Every day | ORAL | Status: DC

## 2014-12-24 MED ORDER — RISPERIDONE 1 MG PO TABS: 0.2500 mg | ORAL_TABLET | Freq: Every day | ORAL | Status: DC

## 2014-12-24 MED ORDER — ATORVASTATIN CALCIUM 40 MG PO TABS: 40.0000 mg | ORAL_TABLET | Freq: Every day | ORAL | Status: DC

## 2014-12-24 MED ORDER — CITALOPRAM HYDROBROMIDE 40 MG PO TABS: 40.0000 mg | ORAL_TABLET | Freq: Every day | ORAL | Status: DC

## 2014-12-24 MED ORDER — LEVOTHYROXINE SODIUM 25 MCG PO TABS: 25.0000 ug | ORAL_TABLET | Freq: Every day | ORAL | Status: DC

## 2014-12-24 NOTE — Progress Notes (Signed)
Subjective:    Patient ID: Brenda Solis, female    DOB: Sep 02, 1934, 80 y.o.   MRN: [id number]  Thyroid Problem Patient reports no cold intolerance, heat intolerance or palpitations. Her past medical history is significant for hyperlipidemia.  Hypertension This is a chronic problem. The current episode started more than 1 year ago. The problem is controlled. Pertinent negatives include no palpitations or shortness of breath. Risk factors for coronary artery disease include dyslipidemia and post-menopausal state. Past treatments include nothing. The current treatment provides moderate improvement. Compliance problems include diet and exercise.  Hypertensive end-organ damage includes a thyroid problem. There is no history of CAD/MI, CVA or retinopathy.  Hyperlipidemia This is a chronic problem. The current episode started more than 1 year ago. Recent lipid tests were reviewed and are variable. Pertinent negatives include no myalgias or shortness of breath. Current antihyperlipidemic treatment includes statins. Compliance problems include adherence to diet and adherence to exercise.  Risk factors for coronary artery disease include dyslipidemia and post-menopausal.  Depression Pt on Celexa 20 mg daily-depression screen scored 11- not under control.. No complaints or concerns. Pt also has prn Xanax .25 mg -but states she rarely uses it. Insomnia Pt taking 10 mg Ambien q HS- pt states working well. Denies any side effects     Review of Systems  Constitutional: Negative.   Respiratory: Negative for shortness of breath.   Cardiovascular: Negative for palpitations.  Endocrine: Negative for cold intolerance and heat intolerance.  Musculoskeletal: Positive for back pain (Surgery on lower back one month ago). Negative for myalgias.  Psychiatric/Behavioral: Positive for dysphoric mood. Negative for suicidal ideas.  All other systems reviewed and are negative.      Objective:   Physical Exam   Constitutional: She is oriented to person, place, and time. She appears well-developed and well-nourished.  HENT:  Head: Normocephalic.  Right Ear: External ear normal.  Left Ear: External ear normal.  Nose: Nose normal.  Mouth/Throat: Oropharynx is clear and moist.  Eyes: EOM are normal. Pupils are equal, round, and reactive to light.  Neck: Normal range of motion. Neck supple.  Cardiovascular: Normal rate and regular rhythm.   Pulmonary/Chest: Effort normal and breath sounds normal.  Abdominal: Soft. Bowel sounds are normal. She exhibits no distension. There is no tenderness.  Musculoskeletal: She exhibits no edema.     Neurological: She is alert and oriented to person, place, and time.  Skin: Skin is warm and dry.  Psychiatric: She has a normal mood and affect. Her behavior is normal. Judgment and thought content normal.  Vitals reviewed.    BP 133/61 mmHg  Pulse 59  Temp(Src) 97.4 F (36.3 C) (Oral)  Ht 5' 8"  (1.727 m)  Wt 188 lb (85.276 kg)  BMI 28.59 kg/m2     Assessment & Plan:   1. Essential hypertension Do not add slat to diet - metoprolol succinate (TOPROL-XL) 25 MG 24 hr tablet; Take 1 tablet (25 mg total) by mouth every morning.  Dispense: 30 tablet; Refill: 5 - CMP14+EGFR  2. Hypothyroidism, unspecified hypothyroidism type - levothyroxine (SYNTHROID, LEVOTHROID) 25 MCG tablet; Take 1 tablet (25 mcg total) by mouth daily before breakfast.  Dispense: 30 tablet; Refill: 11 - Thyroid Panel With TSH  3. Mood disorder Stress management - citalopram (CELEXA) 40 MG tablet; Take 1 tablet (40 mg total) by mouth daily.  Dispense: 30 tablet; Refill: 5  4. Insomnia Bedtime ritual - risperiDONE (RISPERDAL) 1 MG tablet; Take 0.5 tablets (0.5 mg total) by mouth  at bedtime.  Dispense: 30 tablet; Refill: 1 - zolpidem (AMBIEN) 10 MG tablet; Take 1 tablet (10 mg total) by mouth at bedtime.  Dispense: 30 tablet; Refill: 1  5. Hyperlipidemia with target LDL less than  100 Low fat diet - atorvastatin (LIPITOR) 40 MG tablet; Take 1 tablet (40 mg total) by mouth at bedtime.  Dispense: 30 tablet; Refill: 5 - Lipid panel  6. Depression  7. Lumbar spondylosis, unspecified spinal osteoarthritis    Labs pending Health maintenance reviewed Diet and exercise encouraged Continue all meds Follow up  In 3 month   [place], FNP

## 2014-12-24 NOTE — Patient Instructions (Signed)
Insomnia Insomnia is frequent trouble falling and/or staying asleep. Insomnia can be a long term problem or a short term problem. Both are common. Insomnia can be a short term problem when the wakefulness is related to a certain stress or worry. Long term insomnia is often related to ongoing stress during waking hours and/or poor sleeping habits. Overtime, sleep deprivation itself can make the problem worse. Every little thing feels more severe because you are overtired and your ability to cope is decreased. CAUSES   Stress, anxiety, and depression.  Poor sleeping habits.  Distractions such as TV in the bedroom.  Naps close to bedtime.  Engaging in emotionally charged conversations before bed.  Technical reading before sleep.  Alcohol and other sedatives. They may make the problem worse. They can hurt normal sleep patterns and normal dream activity.  Stimulants such as caffeine for several hours prior to bedtime.  Pain syndromes and shortness of breath can cause insomnia.  Exercise late at night.  Changing time zones may cause sleeping problems (jet lag). It is sometimes helpful to have someone observe your sleeping patterns. They should look for periods of not breathing during the night (sleep apnea). They should also look to see how long those periods last. If you live alone or observers are uncertain, you can also be observed at a sleep clinic where your sleep patterns will be professionally monitored. Sleep apnea requires a checkup and treatment. Give your caregivers your medical history. Give your caregivers observations your family has made about your sleep.  SYMPTOMS   Not feeling rested in the morning.  Anxiety and restlessness at bedtime.  Difficulty falling and staying asleep. TREATMENT   Your caregiver may prescribe treatment for an underlying medical disorders. Your caregiver can give advice or help if you are using alcohol or other drugs for self-medication. Treatment  of underlying problems will usually eliminate insomnia problems.  Medications can be prescribed for short time use. They are generally not recommended for lengthy use.  Over-the-counter sleep medicines are not recommended for lengthy use. They can be habit forming.  You can promote easier sleeping by making lifestyle changes such as:  Using relaxation techniques that help with breathing and reduce muscle tension.  Exercising earlier in the day.  Changing your diet and the time of your last meal. No night time snacks.  Establish a regular time to go to bed.  Counseling can help with stressful problems and worry.  Soothing music and white noise may be helpful if there are background noises you cannot remove.  Stop tedious detailed work at least one hour before bedtime. HOME CARE INSTRUCTIONS   Keep a diary. Inform your caregiver about your progress. This includes any medication side effects. See your caregiver regularly. Take note of:  Times when you are asleep.  Times when you are awake during the night.  The quality of your sleep.  How you feel the next day. This information will help your caregiver care for you.  Get out of bed if you are still awake after 15 minutes. Read or do some quiet activity. Keep the lights down. Wait until you feel sleepy and go back to bed.  Keep regular sleeping and waking hours. Avoid naps.  Exercise regularly.  Avoid distractions at bedtime. Distractions include watching television or engaging in any intense or detailed activity like attempting to balance the household checkbook.  Develop a bedtime ritual. Keep a familiar routine of bathing, brushing your teeth, climbing into bed at the same   time each night, listening to soothing music. Routines increase the success of falling to sleep faster.  Use relaxation techniques. This can be using breathing and muscle tension release routines. It can also include visualizing peaceful scenes. You can  also help control troubling or intruding thoughts by keeping your mind occupied with boring or repetitive thoughts like the old concept of counting sheep. You can make it more creative like imagining planting one beautiful flower after another in your backyard garden.  During your day, work to eliminate stress. When this is not possible use some of the previous suggestions to help reduce the anxiety that accompanies stressful situations. MAKE SURE YOU:   Understand these instructions.  Will watch your condition.  Will get help right away if you are not doing well or get worse. Document Released: 06/11/2000 Document Revised: 09/06/2011 Document Reviewed: 07/12/2007 Exit[employer] Patient Information 2015 [employer]. This information is not intended to replace advice given to you by your health care provider. Make sure you discuss any questions you have with your health care provider.  

## 2014-12-25 ENCOUNTER — Telehealth: Payer: Self-pay | Admitting: *Deleted

## 2014-12-25 LAB — CMP14+EGFR
ALK PHOS: 65 IU/L (ref 39–117)
ALT: 19 IU/L (ref 0–32)
AST: 28 IU/L (ref 0–40)
Albumin/Globulin Ratio: 2 (ref 1.1–2.5)
Albumin: 4.5 g/dL (ref 3.5–4.8)
BUN / CREAT RATIO: 25 (ref 11–26)
BUN: 25 mg/dL (ref 8–27)
Bilirubin Total: 0.4 mg/dL (ref 0.0–1.2)
CO2: 18 mmol/L (ref 18–29)
CREATININE: 0.99 mg/dL (ref 0.57–1.00)
Calcium: 10 mg/dL (ref 8.7–10.3)
Chloride: 101 mmol/L (ref 97–108)
GFR calc non Af Amer: 55 mL/min/{1.73_m2} — ABNORMAL LOW (ref >59)
GFR, EST AFRICAN AMERICAN: 63 mL/min/{1.73_m2} (ref >59)
Globulin, Total: 2.3 g/dL (ref 1.5–4.5)
Glucose: 86 mg/dL (ref 65–99)
Potassium: 5 mmol/L (ref 3.5–5.2)
SODIUM: 140 mmol/L (ref 134–144)
Total Protein: 6.8 g/dL (ref 6.0–8.5)

## 2014-12-25 LAB — THYROID PANEL WITH TSH
Free Thyroxine Index: 1.7 (ref 1.2–4.9)
T3 Uptake Ratio: 25 % (ref 24–39)
T4, Total: 6.7 ug/dL (ref 4.5–12.0)
TSH: 2.63 u[IU]/mL (ref 0.450–4.500)

## 2014-12-25 LAB — LIPID PANEL
Chol/HDL Ratio: 2.4 ratio units (ref 0.0–4.4)
Cholesterol, Total: 120 mg/dL (ref 100–199)
HDL: 50 mg/dL (ref >39)
LDL Calculated: 45 mg/dL (ref 0–99)
TRIGLYCERIDES: 124 mg/dL (ref 0–149)
VLDL Cholesterol Cal: 25 mg/dL (ref 5–40)

## 2014-12-25 NOTE — Telephone Encounter (Signed)
-----   Message from [facility], [place] sent at 12/25/2014 11:19 AM EDT ----- Kidney and liver function stable Cholesterol looks great Continue current meds- low fat diet and exercise and recheck in 3 months

## 2014-12-25 NOTE — Telephone Encounter (Signed)
Pt notified of results Verbalizes understanding 

## 2015-01-09 ENCOUNTER — Ambulatory Visit (INDEPENDENT_AMBULATORY_CARE_PROVIDER_SITE_OTHER): Payer: Medicare Other | Admitting: Nurse Practitioner

## 2015-01-09 ENCOUNTER — Encounter: Payer: Self-pay | Admitting: Nurse Practitioner

## 2015-01-09 ENCOUNTER — Ambulatory Visit (INDEPENDENT_AMBULATORY_CARE_PROVIDER_SITE_OTHER): Payer: Medicare Other

## 2015-01-09 VITALS — BP 134/67 | HR 63 | Temp 97.2°F | Ht 68.0 in | Wt 189.0 lb

## 2015-01-09 DIAGNOSIS — M25551 Pain in right hip: Secondary | ICD-10-CM | POA: Diagnosis not present

## 2015-01-09 MED ORDER — METHYLPREDNISOLONE ACETATE 80 MG/ML IJ SUSP
80.0000 mg | Freq: Once | INTRAMUSCULAR | Status: AC
  Administered 2015-01-09: 80 mg via INTRAMUSCULAR

## 2015-01-09 MED ORDER — MELOXICAM 15 MG PO TABS: 15.0000 mg | ORAL_TABLET | Freq: Every day | ORAL | Status: DC

## 2015-01-09 NOTE — Patient Instructions (Signed)
Hip Pain Your hip is the joint between your upper legs and your lower pelvis. The bones, cartilage, tendons, and muscles of your hip joint perform a lot of work each day supporting your body weight and allowing you to move around. Hip pain can range from a minor ache to severe pain in one or both of your hips. Pain may be felt on the inside of the hip joint near the groin, or the outside near the buttocks and upper thigh. You may have swelling or stiffness as well.  HOME CARE INSTRUCTIONS   Take medicines only as directed by your health care provider.  Apply ice to the injured area:  Put ice in a plastic bag.  Place a towel between your skin and the bag.  Leave the ice on for 15-20 minutes at a time, 3-4 times a day.  Keep your leg raised (elevated) when possible to lessen swelling.  Avoid activities that cause pain.  Follow specific exercises as directed by your health care provider.  Sleep with a pillow between your legs on your most comfortable side.  Record how often you have hip pain, the location of the pain, and what it feels like. SEEK MEDICAL CARE IF:   You are unable to put weight on your leg.  Your hip is red or swollen or very tender to touch.  Your pain or swelling continues or worsens after 1 week.  You have increasing difficulty walking.  You have a fever. SEEK IMMEDIATE MEDICAL CARE IF:   You have fallen.  You have a sudden increase in pain and swelling in your hip. MAKE SURE YOU:   Understand these instructions.  Will watch your condition.  Will get help right away if you are not doing well or get worse. Document Released: 12/02/2009 Document Revised: 10/29/2013 Document Reviewed: 02/08/2013 [employer] Patient Information 2015 [employer]. This information is not intended to replace advice given to you by your health care provider. Make sure you discuss any questions you have with your health care provider.  

## 2015-01-09 NOTE — Progress Notes (Signed)
   Subjective:    Patient ID: Brenda Solis, female    DOB: 1934-12-19, 80 y.o.   MRN: [id number]  HPI Patient in today c/o right hip pain- started hurting several days ago- worsens by the end of the day. Pain radiates down leg laterally to the knee- right hip sore to touch. Rates pain 5-9/10. Tramadol helps slightly. SHe had a total hip replacement on right side 5 years ago. SHe had back surgery 2 months ago- says that this pain is different then pain she had when she had her back surgery.    Review of Systems  Constitutional: Negative.   HENT: Negative.   Respiratory: Negative.   Cardiovascular: Negative.   Gastrointestinal: Negative.   Genitourinary: Negative.   Neurological: Negative.   Psychiatric/Behavioral: Negative.   All other systems reviewed and are negative.      Objective:   Physical Exam  Constitutional: She is oriented to person, place, and time. She appears well-developed and well-nourished.  Cardiovascular: Normal rate and normal heart sounds.   Pulmonary/Chest: Effort normal and breath sounds normal.  Musculoskeletal:  Pain on palpation of rigt hip and down thigh laterally. FROM of hip with pain on extension. Motor strength and sensation distally intact  Neurological: She is alert and oriented to person, place, and time. She has normal reflexes.  Skin: Skin is warm and dry.    BP 134/67 mmHg  Pulse 63  Temp(Src) 97.2 F (36.2 C) (Oral)  Ht 5\' 8"  (1.727 m)  Wt 189 lb (85.73 kg)  BMI 28.74 kg/m2  Right hip- total hip replacement looks Virl Cagey, FNP      Assessment & Plan:  1. Hip pain, right Rest Ice if helps - DG HIP UNILAT WITH PELVIS 1V RIGHT; Future - methylPREDNISolone acetate (DEPO-MEDROL) injection 80 mg; Inject 1 mL (80 mg total) into the muscle once. - meloxicam (MOBIC) 15 MG tablet; Take 1 tablet (15 mg total) by mouth daily.  Dispense: 30 tablet; Refill: 3 RTO prn  Mary-Margaret Hassell Done, FNP

## 2015-01-17 DIAGNOSIS — M4806 Spinal stenosis, lumbar region: Secondary | ICD-10-CM | POA: Diagnosis not present

## 2015-01-17 DIAGNOSIS — Z4789 Encounter for other orthopedic aftercare: Secondary | ICD-10-CM | POA: Diagnosis not present

## 2015-03-09 ENCOUNTER — Other Ambulatory Visit: Payer: Self-pay | Admitting: Nurse Practitioner

## 2015-03-10 NOTE — Telephone Encounter (Signed)
Last seen 01/09/15, last filled 02/07/15. Call in at [facility]

## 2015-03-10 NOTE — Telephone Encounter (Signed)
rx called into pharmacy

## 2015-03-10 NOTE — Telephone Encounter (Signed)
Please call in ambien with 1 refills 

## 2015-03-11 ENCOUNTER — Telehealth: Payer: Self-pay | Admitting: Nurse Practitioner

## 2015-03-11 NOTE — Telephone Encounter (Signed)
ERROR

## 2015-03-19 DIAGNOSIS — Z4789 Encounter for other orthopedic aftercare: Secondary | ICD-10-CM | POA: Diagnosis not present

## 2015-04-16 DIAGNOSIS — M5136 Other intervertebral disc degeneration, lumbar region: Secondary | ICD-10-CM | POA: Diagnosis not present

## 2015-05-01 ENCOUNTER — Ambulatory Visit: Payer: Self-pay

## 2015-05-02 DIAGNOSIS — Z4789 Encounter for other orthopedic aftercare: Secondary | ICD-10-CM | POA: Diagnosis not present

## 2015-05-02 DIAGNOSIS — M4806 Spinal stenosis, lumbar region: Secondary | ICD-10-CM | POA: Diagnosis not present

## 2015-05-06 ENCOUNTER — Other Ambulatory Visit: Payer: Self-pay | Admitting: Nurse Practitioner

## 2015-05-06 NOTE — Telephone Encounter (Signed)
Last filled 04/08/15, last seen 12/24/14. Call in at [facility]

## 2015-05-07 NOTE — Telephone Encounter (Signed)
Please call in ambien with 1 refills 

## 2015-05-07 NOTE — Telephone Encounter (Signed)
rx called into pharmacy

## 2015-05-19 ENCOUNTER — Telehealth: Payer: Self-pay | Admitting: Nurse Practitioner

## 2015-05-19 DIAGNOSIS — G4700 Insomnia, unspecified: Secondary | ICD-10-CM

## 2015-05-19 MED ORDER — RISPERIDONE 1 MG PO TABS: 1.0000 mg | ORAL_TABLET | Freq: Every day | ORAL | Status: DC

## 2015-05-19 NOTE — Telephone Encounter (Signed)
Call patient and let her know that she is on highest dose of her depression med and the only thing she can do right now is increase resperidol to 1 qhs- the new rx can be given to lou ann to take to her

## 2015-05-19 NOTE — Telephone Encounter (Signed)
Pt is aware and Brenda Solis has rx already.

## 2015-05-28 DIAGNOSIS — M961 Postlaminectomy syndrome, not elsewhere classified: Secondary | ICD-10-CM | POA: Diagnosis not present

## 2015-05-28 DIAGNOSIS — M5136 Other intervertebral disc degeneration, lumbar region: Secondary | ICD-10-CM | POA: Diagnosis not present

## 2015-06-05 ENCOUNTER — Other Ambulatory Visit: Payer: Self-pay | Admitting: Nurse Practitioner

## 2015-06-18 DIAGNOSIS — M5136 Other intervertebral disc degeneration, lumbar region: Secondary | ICD-10-CM | POA: Diagnosis not present

## 2015-06-20 ENCOUNTER — Encounter: Payer: Self-pay | Admitting: Nurse Practitioner

## 2015-06-20 ENCOUNTER — Ambulatory Visit (INDEPENDENT_AMBULATORY_CARE_PROVIDER_SITE_OTHER): Payer: Medicare Other | Admitting: Nurse Practitioner

## 2015-06-20 VITALS — BP 149/73 | HR 67 | Temp 97.5°F | Ht 68.0 in | Wt 189.0 lb

## 2015-06-20 DIAGNOSIS — Z23 Encounter for immunization: Secondary | ICD-10-CM | POA: Diagnosis not present

## 2015-06-20 DIAGNOSIS — R5383 Other fatigue: Secondary | ICD-10-CM | POA: Diagnosis not present

## 2015-06-20 DIAGNOSIS — F329 Major depressive disorder, single episode, unspecified: Secondary | ICD-10-CM | POA: Diagnosis not present

## 2015-06-20 DIAGNOSIS — F32A Depression, unspecified: Secondary | ICD-10-CM

## 2015-06-20 MED ORDER — SERTRALINE HCL 100 MG PO TABS: 100.0000 mg | ORAL_TABLET | Freq: Every day | ORAL | Status: DC

## 2015-06-20 NOTE — Progress Notes (Signed)
   Subjective:    Patient ID: KIYO TIFFIN, female    DOB: 02/14/1935, 80 y.o.   MRN: [id number]  HPI Patient in today c/o feeling cold all the time and having very little injury. This has been going on for a good while just seems to be worsening. She is depressed and says that she does not feel like her medicine is working anymore. She is currently on celexa and has been on it for years. Shje says that she just does not feel like doing anything.    Review of Systems  Constitutional: Negative.   HENT: Negative.   Respiratory: Negative.   Cardiovascular: Negative.   Genitourinary: Negative.   Neurological: Negative.   Psychiatric/Behavioral: Negative.   All other systems reviewed and are negative.      Objective:   Physical Exam  Constitutional: She appears well-developed and well-nourished.  Cardiovascular: Normal rate, regular rhythm and normal heart sounds.   Pulmonary/Chest: Effort normal and breath sounds normal.  Skin: Skin is warm.  Psychiatric: She has a normal mood and affect. Her behavior is normal. Judgment and thought content normal.   Depression screen [facility] 2/9 06/20/2015 11/05/2014 10/16/2014 07/09/2014 02/01/2014  Decreased Interest 3 0 0 0 2  Down, Depressed, Hopeless 3 0 0 0 3  PHQ - 2 Score 6 0 0 0 5  Altered sleeping 2 - - - 0  Tired, decreased energy 3 - - - 3  Change in appetite 2 - - - 2  Feeling bad or failure about yourself  1 - - - 0  Trouble concentrating 1 - - - 0  Moving slowly or fidgety/restless 1 - - - 1  Suicidal thoughts 0 - - - 0  PHQ-9 Score 16 - - - 11  Difficult doing work/chores Very difficult - - - -          Assessment & Plan:  1. Depression Stress management try to get out of house more Stop celexa - sertraline (ZOLOFT) 100 MG tablet; Take 1 tablet (100 mg total) by mouth daily.  Dispense: 30 tablet; Refill: 3  2. Fatigue due to depression Labs pending - Anemia Profile B - Thyroid Panel With TSH   Mary-Margaret Hassell Done,  FNP

## 2015-06-20 NOTE — Patient Instructions (Signed)
Stress and Stress Management Stress is a normal reaction to life events. It is what you feel when life demands more than you are used to or more than you can handle. Some stress can be useful. For example, the stress reaction can help you catch the last bus of the day, study for a test, or meet a deadline at work. But stress that occurs too often or for too long can cause problems. It can affect your emotional health and interfere with relationships and normal daily activities. Too much stress can weaken your immune system and increase your risk for physical illness. If you already have a medical problem, stress can make it worse. CAUSES  All sorts of life events may cause stress. An event that causes stress for one person may not be stressful for another person. Major life events commonly cause stress. These may be positive or negative. Examples include losing your job, moving into a new home, getting married, having a baby, or losing a loved one. Less obvious life events may also cause stress, especially if they occur day after day or in combination. Examples include working long hours, driving in traffic, caring for children, being in debt, or being in a difficult relationship. SIGNS AND SYMPTOMS Stress may cause emotional symptoms including, the following:  Anxiety. This is feeling worried, afraid, on edge, overwhelmed, or out of control.  Anger. This is feeling irritated or impatient.  Depression. This is feeling sad, down, helpless, or guilty.  Difficulty focusing, remembering, or making decisions. Stress may cause physical symptoms, including the following:   Aches and pains. These may affect your head, neck, back, stomach, or other areas of your body.  Tight muscles or clenched jaw.  Low energy or trouble sleeping. Stress may cause unhealthy behaviors, including the following:   Eating to feel better (overeating) or skipping meals.  Sleeping too little, too much, or both.  Working  too much or putting off tasks (procrastination).  Smoking, drinking alcohol, or using drugs to feel better. DIAGNOSIS  Stress is diagnosed through an assessment by your health care provider. Your health care provider will ask questions about your symptoms and any stressful life events.Your health care provider will also ask about your medical history and may order blood tests or other tests. Certain medical conditions and medicine can cause physical symptoms similar to stress. Mental illness can cause emotional symptoms and unhealthy behaviors similar to stress. Your health care provider may refer you to a mental health professional for further evaluation.  TREATMENT  Stress management is the recommended treatment for stress.The goals of stress management are reducing stressful life events and coping with stress in healthy ways.  Techniques for reducing stressful life events include the following:  Stress identification. Self-monitor for stress and identify what causes stress for you. These skills may help you to avoid some stressful events.  Time management. Set your priorities, keep a calendar of events, and learn to say "no." These tools can help you avoid making too many commitments. Techniques for coping with stress include the following:  Rethinking the problem. Try to think realistically about stressful events rather than ignoring them or overreacting. Try to find the positives in a stressful situation rather than focusing on the negatives.  Exercise. Physical exercise can release both physical and emotional tension. The key is to find a form of exercise you enjoy and do it regularly.  Relaxation techniques. These relax the body and mind. Examples include yoga, meditation, tai chi, biofeedback, deep  breathing, progressive muscle relaxation, listening to music, being out in nature, journaling, and other hobbies. Again, the key is to find one or more that you enjoy and can do  regularly.  Healthy lifestyle. Eat a balanced diet, get plenty of sleep, and do not smoke. Avoid using alcohol or drugs to relax.  Strong support network. Spend time with family, friends, or other people you enjoy being around.Express your feelings and talk things over with someone you trust. Counseling or talktherapy with a mental health professional may be helpful if you are having difficulty managing stress on your own. Medicine is typically not recommended for the treatment of stress.Talk to your health care provider if you think you need medicine for symptoms of stress. HOME CARE INSTRUCTIONS  Keep all follow-up visits as directed by your health care provider.  Take all medicines as directed by your health care provider. SEEK MEDICAL CARE IF:  Your symptoms get worse or you start having new symptoms.  You feel overwhelmed by your problems and can no longer manage them on your own. SEEK IMMEDIATE MEDICAL CARE IF:  You feel like hurting yourself or someone else.   This information is not intended to replace advice given to you by your health care provider. Make sure you discuss any questions you have with your health care provider.   Document Released: 12/08/2000 Document Revised: 07/05/2014 Document Reviewed: 02/06/2013 Elsevier Interactive Patient Education 2016 Elsevier Inc.  

## 2015-06-21 LAB — ANEMIA PROFILE B
BASOS: 0 %
Basophils Absolute: 0 10*3/uL (ref 0.0–0.2)
EOS (ABSOLUTE): 0 10*3/uL (ref 0.0–0.4)
EOS: 0 %
Ferritin: 79 ng/mL (ref 15–150)
Folate: 8.2 ng/mL (ref >3.0)
HEMATOCRIT: 39.8 % (ref 34.0–46.6)
HEMOGLOBIN: 12.9 g/dL (ref 11.1–15.9)
IMMATURE GRANS (ABS): 0 10*3/uL (ref 0.0–0.1)
IMMATURE GRANULOCYTES: 0 %
IRON SATURATION: 21 % (ref 15–55)
IRON: 71 ug/dL (ref 27–139)
LYMPHS: 31 %
Lymphocytes Absolute: 2.8 10*3/uL (ref 0.7–3.1)
MCH: 30.5 pg (ref 26.6–33.0)
MCHC: 32.4 g/dL (ref 31.5–35.7)
MCV: 94 fL (ref 79–97)
Monocytes Absolute: 0.8 10*3/uL (ref 0.1–0.9)
Monocytes: 8 %
NEUTROS PCT: 61 %
Neutrophils Absolute: 5.4 10*3/uL (ref 1.4–7.0)
Platelets: 228 10*3/uL (ref 150–379)
RBC: 4.23 x10E6/uL (ref 3.77–5.28)
RDW: 14.4 % (ref 12.3–15.4)
RETIC CT PCT: 1.4 % (ref 0.6–2.6)
TIBC: 343 ug/dL (ref 250–450)
UIBC: 272 ug/dL (ref 118–369)
Vitamin B-12: 1479 pg/mL — ABNORMAL HIGH (ref 211–946)
WBC: 9.1 10*3/uL (ref 3.4–10.8)

## 2015-06-21 LAB — THYROID PANEL WITH TSH
FREE THYROXINE INDEX: 2.1 (ref 1.2–4.9)
T3 UPTAKE RATIO: 26 % (ref 24–39)
T4 TOTAL: 8 ug/dL (ref 4.5–12.0)
TSH: 3.17 u[IU]/mL (ref 0.450–4.500)

## 2015-06-24 ENCOUNTER — Telehealth: Payer: Self-pay | Admitting: Nurse Practitioner

## 2015-07-03 NOTE — Telephone Encounter (Signed)
Patient called.

## 2015-07-04 ENCOUNTER — Other Ambulatory Visit: Payer: Self-pay | Admitting: Nurse Practitioner

## 2015-07-04 NOTE — Telephone Encounter (Signed)
Last filled 06/05/15, last seen 06/20/15. MMM pt. Route to pool, call in at [facility]

## 2015-07-04 NOTE — Telephone Encounter (Signed)
Forwarding to MMM

## 2015-07-07 DIAGNOSIS — Z4789 Encounter for other orthopedic aftercare: Secondary | ICD-10-CM | POA: Diagnosis not present

## 2015-07-07 NOTE — Telephone Encounter (Signed)
Please call in ambien with 2 refills 

## 2015-07-07 NOTE — Telephone Encounter (Signed)
rx called into pharmacy

## 2015-07-11 ENCOUNTER — Other Ambulatory Visit: Payer: Self-pay | Admitting: Family Medicine

## 2015-07-15 ENCOUNTER — Telehealth: Payer: Self-pay

## 2015-07-15 NOTE — Telephone Encounter (Signed)
x

## 2015-07-25 ENCOUNTER — Encounter (HOSPITAL_COMMUNITY): Payer: Self-pay | Admitting: Emergency Medicine

## 2015-07-25 ENCOUNTER — Observation Stay (HOSPITAL_COMMUNITY)
Admission: EM | Admit: 2015-07-25 | Discharge: 2015-07-28 | Disposition: A | Discharge status: Home / Self Care | Payer: Medicare Other | Attending: Internal Medicine | Admitting: Internal Medicine

## 2015-07-25 DIAGNOSIS — R42 Dizziness and giddiness: Secondary | ICD-10-CM | POA: Diagnosis not present

## 2015-07-25 DIAGNOSIS — Z79899 Other long term (current) drug therapy: Secondary | ICD-10-CM | POA: Diagnosis not present

## 2015-07-25 DIAGNOSIS — E785 Hyperlipidemia, unspecified: Secondary | ICD-10-CM | POA: Diagnosis not present

## 2015-07-25 DIAGNOSIS — Z96641 Presence of right artificial hip joint: Secondary | ICD-10-CM | POA: Diagnosis not present

## 2015-07-25 DIAGNOSIS — E039 Hypothyroidism, unspecified: Secondary | ICD-10-CM | POA: Insufficient documentation

## 2015-07-25 DIAGNOSIS — E872 Acidosis, unspecified: Secondary | ICD-10-CM

## 2015-07-25 DIAGNOSIS — E876 Hypokalemia: Secondary | ICD-10-CM | POA: Insufficient documentation

## 2015-07-25 DIAGNOSIS — I951 Orthostatic hypotension: Principal | ICD-10-CM | POA: Insufficient documentation

## 2015-07-25 DIAGNOSIS — M79606 Pain in leg, unspecified: Secondary | ICD-10-CM | POA: Diagnosis not present

## 2015-07-25 DIAGNOSIS — F329 Major depressive disorder, single episode, unspecified: Secondary | ICD-10-CM | POA: Insufficient documentation

## 2015-07-25 DIAGNOSIS — Z9049 Acquired absence of other specified parts of digestive tract: Secondary | ICD-10-CM | POA: Diagnosis not present

## 2015-07-25 DIAGNOSIS — I10 Essential (primary) hypertension: Secondary | ICD-10-CM | POA: Insufficient documentation

## 2015-07-25 DIAGNOSIS — R000 Tachycardia, unspecified: Secondary | ICD-10-CM | POA: Insufficient documentation

## 2015-07-25 DIAGNOSIS — R197 Diarrhea, unspecified: Secondary | ICD-10-CM | POA: Diagnosis present

## 2015-07-25 DIAGNOSIS — E860 Dehydration: Secondary | ICD-10-CM | POA: Diagnosis not present

## 2015-07-25 DIAGNOSIS — A09 Infectious gastroenteritis and colitis, unspecified: Secondary | ICD-10-CM | POA: Diagnosis not present

## 2015-07-25 DIAGNOSIS — R404 Transient alteration of awareness: Secondary | ICD-10-CM | POA: Diagnosis not present

## 2015-07-25 DIAGNOSIS — F32A Depression, unspecified: Secondary | ICD-10-CM | POA: Diagnosis present

## 2015-07-25 DIAGNOSIS — R531 Weakness: Secondary | ICD-10-CM | POA: Diagnosis not present

## 2015-07-25 LAB — COMPREHENSIVE METABOLIC PANEL
ALK PHOS: 43 U/L (ref 38–126)
ALT: 18 U/L (ref 14–54)
AST: 29 U/L (ref 15–41)
Albumin: 3.9 g/dL (ref 3.5–5.0)
Anion gap: 12 (ref 5–15)
BUN: 36 mg/dL — AB (ref 6–20)
CALCIUM: 9.1 mg/dL (ref 8.9–10.3)
CHLORIDE: 108 mmol/L (ref 101–111)
CO2: 17 mmol/L — AB (ref 22–32)
CREATININE: 1.03 mg/dL — AB (ref 0.44–1.00)
GFR, EST AFRICAN AMERICAN: 59 mL/min — AB (ref >60)
GFR, EST NON AFRICAN AMERICAN: 51 mL/min — AB (ref >60)
Glucose, Bld: 142 mg/dL — ABNORMAL HIGH (ref 65–99)
Potassium: 3.8 mmol/L (ref 3.5–5.1)
SODIUM: 137 mmol/L (ref 135–145)
Total Bilirubin: 0.6 mg/dL (ref 0.3–1.2)
Total Protein: 6.6 g/dL (ref 6.5–8.1)

## 2015-07-25 LAB — CBC WITH DIFFERENTIAL/PLATELET
BASOS ABS: 0 10*3/uL (ref 0.0–0.1)
Basophils Relative: 0 %
Eosinophils Absolute: 0 10*3/uL (ref 0.0–0.7)
Eosinophils Relative: 0 %
HCT: 39.8 % (ref 36.0–46.0)
HEMOGLOBIN: 13.5 g/dL (ref 12.0–15.0)
LYMPHS ABS: 0.4 10*3/uL — AB (ref 0.7–4.0)
LYMPHS PCT: 4 %
MCH: 31.2 pg (ref 26.0–34.0)
MCHC: 33.9 g/dL (ref 30.0–36.0)
MCV: 91.9 fL (ref 78.0–100.0)
Monocytes Absolute: 0.3 10*3/uL (ref 0.1–1.0)
Monocytes Relative: 3 %
NEUTROS ABS: 10.7 10*3/uL — AB (ref 1.7–7.7)
NEUTROS PCT: 93 %
PLATELETS: 164 10*3/uL (ref 150–400)
RBC: 4.33 MIL/uL (ref 3.87–5.11)
RDW: 13.4 % (ref 11.5–15.5)
WBC: 11.4 10*3/uL — AB (ref 4.0–10.5)

## 2015-07-25 LAB — URINALYSIS, ROUTINE W REFLEX MICROSCOPIC
BILIRUBIN URINE: NEGATIVE
GLUCOSE, UA: NEGATIVE mg/dL
Ketones, ur: NEGATIVE mg/dL
Nitrite: NEGATIVE
Protein, ur: NEGATIVE mg/dL
SPECIFIC GRAVITY, URINE: 1.03 (ref 1.005–1.030)
pH: 6 (ref 5.0–8.0)

## 2015-07-25 LAB — C DIFFICILE QUICK SCREEN W PCR REFLEX
C DIFFICILE (CDIFF) INTERP: NEGATIVE
C Diff antigen: NEGATIVE
C Diff toxin: NEGATIVE

## 2015-07-25 LAB — LIPASE, BLOOD: Lipase: 27 U/L (ref 11–51)

## 2015-07-25 LAB — URINE MICROSCOPIC-ADD ON

## 2015-07-25 LAB — MAGNESIUM: MAGNESIUM: 1.6 mg/dL — AB (ref 1.7–2.4)

## 2015-07-25 LAB — I-STAT CG4 LACTIC ACID, ED
LACTIC ACID, VENOUS: 2.05 mmol/L — AB (ref 0.5–2.0)
LACTIC ACID, VENOUS: 2.08 mmol/L — AB (ref 0.5–2.0)

## 2015-07-25 MED ORDER — METOPROLOL SUCCINATE ER 25 MG PO TB24
25.0000 mg | ORAL_TABLET | Freq: Every morning | ORAL | Status: DC
Start: 2015-07-26 — End: 2015-07-28
  Administered 2015-07-27 – 2015-07-28 (×2): 25 mg via ORAL
  Filled 2015-07-25 (×3): qty 1

## 2015-07-25 MED ORDER — SODIUM CHLORIDE 0.9 % IV BOLUS (SEPSIS)
1000.0000 mL | Freq: Once | INTRAVENOUS | Status: AC
  Administered 2015-07-25: 1000 mL via INTRAVENOUS

## 2015-07-25 MED ORDER — LEVOTHYROXINE SODIUM 25 MCG PO TABS
25.0000 ug | ORAL_TABLET | Freq: Every day | ORAL | Status: DC
  Administered 2015-07-26 – 2015-07-28 (×3): 25 ug via ORAL
  Filled 2015-07-25 (×4): qty 1

## 2015-07-25 MED ORDER — MECLIZINE HCL 25 MG PO TABS
25.0000 mg | ORAL_TABLET | Freq: Three times a day (TID) | ORAL | Status: DC | PRN
  Filled 2015-07-25: qty 1

## 2015-07-25 MED ORDER — ZOLPIDEM TARTRATE 10 MG PO TABS
10.0000 mg | ORAL_TABLET | Freq: Every day | ORAL | Status: DC
  Administered 2015-07-25 – 2015-07-27 (×3): 10 mg via ORAL
  Filled 2015-07-25 (×3): qty 1

## 2015-07-25 MED ORDER — SERTRALINE HCL 100 MG PO TABS
100.0000 mg | ORAL_TABLET | Freq: Every day | ORAL | Status: DC
  Administered 2015-07-26 – 2015-07-28 (×3): 100 mg via ORAL
  Filled 2015-07-25 (×3): qty 1

## 2015-07-25 MED ORDER — ALPRAZOLAM 0.25 MG PO TABS
0.2500 mg | ORAL_TABLET | Freq: Every evening | ORAL | Status: DC | PRN
  Administered 2015-07-26: 0.25 mg via ORAL
  Filled 2015-07-25 (×2): qty 1

## 2015-07-25 MED ORDER — MELOXICAM 15 MG PO TABS
15.0000 mg | ORAL_TABLET | Freq: Every day | ORAL | Status: DC
  Administered 2015-07-25 – 2015-07-28 (×4): 15 mg via ORAL
  Filled 2015-07-25 (×4): qty 1

## 2015-07-25 MED ORDER — RISPERIDONE 1 MG PO TABS
1.0000 mg | ORAL_TABLET | Freq: Every day | ORAL | Status: DC
  Administered 2015-07-25 – 2015-07-27 (×3): 1 mg via ORAL
  Filled 2015-07-25 (×4): qty 1

## 2015-07-25 MED ORDER — LOPERAMIDE HCL 2 MG PO CAPS
4.0000 mg | ORAL_CAPSULE | ORAL | Status: DC | PRN
  Administered 2015-07-26 (×2): 4 mg via ORAL
  Filled 2015-07-25 (×2): qty 2

## 2015-07-25 MED ORDER — SODIUM CHLORIDE 0.9 % IV SOLN
INTRAVENOUS | Status: DC
  Administered 2015-07-25 – 2015-07-27 (×2): via INTRAVENOUS

## 2015-07-25 MED ORDER — VITAMIN D3 25 MCG (1000 UNIT) PO TABS
2000.0000 [IU] | ORAL_TABLET | Freq: Every day | ORAL | Status: DC
  Administered 2015-07-26 – 2015-07-28 (×3): 2000 [IU] via ORAL
  Filled 2015-07-25 (×3): qty 2

## 2015-07-25 MED ORDER — ONDANSETRON HCL 4 MG/2ML IJ SOLN: 4.0000 mg | Freq: Once | INTRAMUSCULAR | Status: DC

## 2015-07-25 MED ORDER — ATORVASTATIN CALCIUM 40 MG PO TABS
40.0000 mg | ORAL_TABLET | Freq: Every day | ORAL | Status: DC
  Administered 2015-07-25 – 2015-07-27 (×3): 40 mg via ORAL
  Filled 2015-07-25 (×4): qty 1

## 2015-07-25 MED ORDER — HYDROCODONE-ACETAMINOPHEN 10-325 MG PO TABS
1.0000 | ORAL_TABLET | Freq: Four times a day (QID) | ORAL | Status: DC | PRN
  Administered 2015-07-26: 1 via ORAL
  Filled 2015-07-25: qty 1

## 2015-07-25 MED ORDER — ENOXAPARIN SODIUM 40 MG/0.4ML ~~LOC~~ SOLN
40.0000 mg | Freq: Every day | SUBCUTANEOUS | Status: DC
  Administered 2015-07-25 – 2015-07-27 (×3): 40 mg via SUBCUTANEOUS
  Filled 2015-07-25 (×4): qty 0.4

## 2015-07-25 NOTE — ED Notes (Signed)
Patient here from home with complaints of diarrhea. Patient reports that diarrhea is over but now complains of dizziness. Orthostatic. BP when standing 60/42.

## 2015-07-25 NOTE — ED Provider Notes (Signed)
CSN: [id number]     Arrival date & time 07/25/15  1606 History   First MD Initiated Contact with Patient 07/25/15 1620     Chief Complaint  Patient presents with  . Diarrhea  . Dizziness     (Consider location/radiation/quality/duration/timing/severity/associated sxs/prior Treatment) Patient is a 81 y.o. female presenting with diarrhea and dizziness.  Diarrhea Quality:  Watery Severity:  Severe Onset quality:  Sudden Number of episodes:  Maybe 10 Duration:  1 day (started 3AM last night) Timing:  Constant Progression:  Unchanged Relieved by:  Nothing Worsened by:  Nothing tried Ineffective treatments:  None tried Associated symptoms: no abdominal pain, no fever, no headaches and no vomiting   Risk factors: no recent antibiotic use, no sick contacts and no suspicious food intake   Dizziness Associated symptoms: diarrhea, nausea and weakness (generalized)   Associated symptoms: no blood in stool, no chest pain, no headaches, no shortness of breath and no vomiting     Past Medical History  Diagnosis Date  . Thyroid disease     hypothyroidism  . Insomnia, unspecified   . Spondylosis of unspecified site without mention of myelopathy   . Hypertension   . Hyperlipidemia   . Depression   . Heart murmur   . Hypothyroidism    Past Surgical History  Procedure Laterality Date  . Total hip arthroplasty  2011    RIGHT  . Cataract extraction, bilateral Bilateral 2006  . Knee surgery Right 2009    meniscus repair  . Cervical disc surgery  1999  . Cesarean section  1962 and 1967  . Cholecystectomy  1990  . Decompressive lumbar laminectomy level 1 N/A 02/01/2013    Procedure: CENTRAL DECOMPRESSIVE LUMBAR LAMINECTOMY LEVEL 1 L4-L5;  Surgeon: Tobi Bastos, MD;  Location: [facility];  Service: Orthopedics;  Laterality: N/A;  . Appendectomy    . Decompressive lumbar laminectomy level 1 N/A 11/20/2014    Procedure: REVISION L4-S1 DECOMPRESSION, L4-L5 IN SITU FUSION (LEVEL 1);  Surgeon:  Melina Schools, MD;  Location: [place];  Service: Orthopedics;  Laterality: N/A;   Family History  Problem Relation Age of Onset  . Stroke      FAMILY  . Esophageal varices      FAMILY  . Coronary artery disease Brother     50s  . Heart disease Brother   . Cancer Mother     breast  . Osteoporosis Mother   . Heart disease Mother   . Osteoporosis Maternal Aunt   . Heart disease Father   . Stroke Father    Social History  Substance Use Topics  . Smoking status: Never Smoker   . Smokeless tobacco: Never Used  . Alcohol Use: No   OB History    No data available     Review of Systems  Constitutional: Positive for fatigue. Negative for fever.  HENT: Negative for sore throat.   Eyes: Negative for visual disturbance.  Respiratory: Negative for cough and shortness of breath.   Cardiovascular: Negative for chest pain.  Gastrointestinal: Positive for nausea and diarrhea. Negative for vomiting, abdominal pain, constipation and blood in stool.  Genitourinary: Negative for difficulty urinating.  Musculoskeletal: Negative for back pain and neck pain.  Skin: Negative for rash.  Neurological: Positive for dizziness, weakness (generalized) and light-headedness. Negative for syncope, facial asymmetry, speech difficulty, numbness and headaches.      Allergies  Gabapentin and Oxycodone  Home Medications   Prior to Admission medications   Medication Sig Start Date  End Date Taking? Authorizing Provider  ALPRAZolam (XANAX) 0.25 MG tablet TAKE ONE TABLET BY MOUTH AT BEDTIME AS NEEDED Patient taking differently: TAKE ONE TABLET BY MOUTH AT BEDTIME AS NEEDED FOR SLEEP/ANXIETY. 12/17/14  Yes Mary-Margaret Hassell Done, FNP  atorvastatin (LIPITOR) 40 MG tablet Take 1 tablet (40 mg total) by mouth at bedtime. 12/24/14  Yes Mary-Margaret Hassell Done, FNP  cholecalciferol (VITAMIN D) 1000 UNITS tablet Take 2,000 Units by mouth daily.     Yes Historical Provider, MD  HYDROcodone-acetaminophen (NORCO) 10-325 MG  per tablet Take 1 tablet by mouth every 6 (six) hours as needed (hold for sedation). Patient taking differently: Take 1 tablet by mouth 2 (two) times daily.  11/22/14  Yes Melina Schools, MD  levothyroxine (SYNTHROID, LEVOTHROID) 25 MCG tablet Take 1 tablet (25 mcg total) by mouth daily before breakfast. 12/24/14  Yes Mary-Margaret Hassell Done, FNP  meclizine (ANTIVERT) 25 MG tablet TAKE ONE TABLET BY MOUTH THREE TIMES DAILY AS NEEDED FOR DIZZINESS 08/14/14  Yes Mary-Margaret Hassell Done, FNP  meloxicam (MOBIC) 15 MG tablet TAKE 1 TABLET (15 MG TOTAL) BY MOUTH DAILY. 06/05/15  Yes Mary-Margaret Hassell Done, FNP  metoprolol succinate (TOPROL-XL) 25 MG 24 hr tablet Take 1 tablet (25 mg total) by mouth every morning. 12/24/14  Yes Mary-Margaret Hassell Done, FNP  risperiDONE (RISPERDAL) 1 MG tablet TAKE ONE TABLET BY MOUTH AT BEDTIME 07/11/15  Yes Mary-Margaret Hassell Done, FNP  sertraline (ZOLOFT) 100 MG tablet Take 1 tablet (100 mg total) by mouth daily. 06/20/15  Yes Mary-Margaret Hassell Done, FNP  zolpidem (AMBIEN) 10 MG tablet TAKE ONE TABLET BY MOUTH AT BEDTIME 07/07/15  Yes Mary-Margaret Hassell Done, FNP   BP 141/43 mmHg  Pulse 98  Temp(Src) 99.3 F (37.4 C) (Oral)  Resp 16  SpO2 98% Physical Exam  Constitutional: She is oriented to person, place, and time. She appears well-developed and well-nourished. No distress.  HENT:  Head: Normocephalic and atraumatic.  Eyes: Conjunctivae and EOM are normal.  Neck: Normal range of motion.  Cardiovascular: Regular rhythm, normal heart sounds and intact distal pulses.  Tachycardia present.  Exam reveals no gallop and no friction rub.   No murmur heard. Pulmonary/Chest: Effort normal and breath sounds normal. No respiratory distress. She has no wheezes. She has no rales.  Abdominal: Soft. She exhibits no distension. There is no tenderness. There is no guarding.  Musculoskeletal: She exhibits no edema or tenderness.  Neurological: She is alert and oriented to person, place, and time.  Skin:  Skin is warm and dry. No rash noted. She is not diaphoretic. No erythema.  Nursing note and vitals reviewed.   ED Course  Procedures (including critical care time) Labs Review Labs Reviewed  CBC WITH DIFFERENTIAL/PLATELET - Abnormal; Notable for the following:    WBC 11.4 (*)    Neutro Abs 10.7 (*)    Lymphs Abs 0.4 (*)    All other components within normal limits  COMPREHENSIVE METABOLIC PANEL - Abnormal; Notable for the following:    CO2 17 (*)    Glucose, Bld 142 (*)    BUN 36 (*)    Creatinine, Ser 1.03 (*)    GFR calc non Af Amer 51 (*)    GFR calc Af Amer 59 (*)    All other components within normal limits  URINALYSIS, ROUTINE W REFLEX MICROSCOPIC (NOT AT [facility]) - Abnormal; Notable for the following:    Hgb urine dipstick SMALL (*)    Leukocytes, UA TRACE (*)    All other components within normal limits  MAGNESIUM -  Abnormal; Notable for the following:    Magnesium 1.6 (*)    All other components within normal limits  URINE MICROSCOPIC-ADD ON - Abnormal; Notable for the following:    Squamous Epithelial / LPF 0-5 (*)    Bacteria, UA MANY (*)    All other components within normal limits  I-STAT CG4 LACTIC ACID, ED - Abnormal; Notable for the following:    Lactic Acid, Venous 2.05 (*)    All other components within normal limits  I-STAT CG4 LACTIC ACID, ED - Abnormal; Notable for the following:    Lactic Acid, Venous 2.08 (*)    All other components within normal limits  C DIFFICILE QUICK SCREEN W PCR REFLEX  URINE CULTURE  GASTROINTESTINAL PANEL BY PCR, STOOL (REPLACES STOOL CULTURE)  LIPASE, BLOOD    Imaging Review No results found. I have personally reviewed and evaluated these images and lab results as part of my medical decision-making.   EKG Interpretation   Date/Time:  Friday July 25 2015 16:30:41 EST Ventricular Rate:  126 PR Interval:  133 QRS Duration: 76 QT Interval:  304 QTC Calculation: 440 R Axis:   -34 Text Interpretation:  Sinus  tachycardia Left axis deviation Borderline  repolarization abnormality Baseline wander in lead(s) V6 Sinus tachycardia  new from prior, repolarization abnormality likely rate related Confirmed  by [facility] MD, Dalayla Aldredge ([id number]) on 07/26/2015 2:34:52 AM      MDM   Final diagnoses:  Dehydration  Diarrhea of presumed infectious origin  Lactic acid acidosis   81 year old female with history of hypertension, hyperlipidemia, hypothyroidism presents with concern of nausea, generalized weakness and diarrhea.  Patient with multiple episodes of watery diarrhea in the emergency department. C. difficile was negative. Patient was given IV fluids, however had persistent mildly elevated lactate of 2, with continuing episodes of diarrhea. Abdominal exam is benign and have low suspicion for other acute intra-abdominal process.  Patient likely with infectious gastroenteritis. She will be admitted for continued hydration.      Gareth Morgan, MD 07/26/15 [phone]

## 2015-07-25 NOTE — ED Notes (Signed)
Pt aware urine sample is needed. Pt unable at this time is only having loose stools

## 2015-07-25 NOTE — H&P (Addendum)
[facility] History and Physical  Brenda Solis [id number] DOB: [birth year] DOA: 07/25/2015  Referring physician: EDP PCP: Chevis Pretty, FNP   Chief Complaint: Diarrhea, dizziness   HPI: Brenda Solis is a 81 y.o. female who presents to the ED with innumerable episodes of diarrhea.  Diarrhea has been going on for the past 1 day, no h/o ABx use recently, sick contacts, suspicious food intake.  Diarrhea is watery.  Continues while in ED.  Onset at 3AM last night.  Later today she began to develop dizziness.  In the ED she is confirmed to have orthostatic hypotension with tachycardia of 128 and BP drops significantly when standing (per EMS report as low as 0000000 systolic, in ED drops to [id number] systolic still), BP and HR lying down are 140s and 90s respectively.  Review of Systems: Systems reviewed.  As above, otherwise negative  Past Medical History  Diagnosis Date  . Thyroid disease     hypothyroidism  . Insomnia, unspecified   . Spondylosis of unspecified site without mention of myelopathy   . Hypertension   . Hyperlipidemia   . Depression   . Heart murmur   . Hypothyroidism    Past Surgical History  Procedure Laterality Date  . Total hip arthroplasty  2011    RIGHT  . Cataract extraction, bilateral Bilateral 2006  . Knee surgery Right 2009    meniscus repair  . Cervical disc surgery  1999  . Cesarean section  1962 and 1967  . Cholecystectomy  1990  . Decompressive lumbar laminectomy level 1 N/A 02/01/2013    Procedure: CENTRAL DECOMPRESSIVE LUMBAR LAMINECTOMY LEVEL 1 L4-L5;  Surgeon: Tobi Bastos, MD;  Location: [facility];  Service: Orthopedics;  Laterality: N/A;  . Appendectomy    . Decompressive lumbar laminectomy level 1 N/A 11/20/2014    Procedure: REVISION L4-S1 DECOMPRESSION, L4-L5 IN SITU FUSION (LEVEL 1);  Surgeon: Melina Schools, MD;  Location: [place];  Service: Orthopedics;  Laterality: N/A;   Social History:  reports that she has never smoked. She has  never used smokeless tobacco. She reports that she does not drink alcohol or use illicit drugs.  Allergies  Allergen Reactions  . Gabapentin Other (See Comments)    Dizzy  . Oxycodone Itching    Family History  Problem Relation Age of Onset  . Stroke      FAMILY  . Esophageal varices      FAMILY  . Coronary artery disease Brother     50s  . Heart disease Brother   . Cancer Mother     breast  . Osteoporosis Mother   . Heart disease Mother   . Osteoporosis Maternal Aunt   . Heart disease Father   . Stroke Father      Prior to Admission medications   Medication Sig Start Date End Date Taking? Authorizing Provider  ALPRAZolam (XANAX) 0.25 MG tablet TAKE ONE TABLET BY MOUTH AT BEDTIME AS NEEDED Patient taking differently: TAKE ONE TABLET BY MOUTH AT BEDTIME AS NEEDED FOR SLEEP/ANXIETY. 12/17/14  Yes Mary-Margaret Hassell Done, FNP  atorvastatin (LIPITOR) 40 MG tablet Take 1 tablet (40 mg total) by mouth at bedtime. 12/24/14  Yes Mary-Margaret Hassell Done, FNP  cholecalciferol (VITAMIN D) 1000 UNITS tablet Take 2,000 Units by mouth daily.     Yes Historical Provider, MD  HYDROcodone-acetaminophen (NORCO) 10-325 MG per tablet Take 1 tablet by mouth every 6 (six) hours as needed (hold for sedation). Patient taking differently: Take 1 tablet by mouth 2 (two)  times daily.  11/22/14  Yes Melina Schools, MD  levothyroxine (SYNTHROID, LEVOTHROID) 25 MCG tablet Take 1 tablet (25 mcg total) by mouth daily before breakfast. 12/24/14  Yes Mary-Margaret Hassell Done, FNP  meclizine (ANTIVERT) 25 MG tablet TAKE ONE TABLET BY MOUTH THREE TIMES DAILY AS NEEDED FOR DIZZINESS 08/14/14  Yes Mary-Margaret Hassell Done, FNP  meloxicam (MOBIC) 15 MG tablet TAKE 1 TABLET (15 MG TOTAL) BY MOUTH DAILY. 06/05/15  Yes Mary-Margaret Hassell Done, FNP  metoprolol succinate (TOPROL-XL) 25 MG 24 hr tablet Take 1 tablet (25 mg total) by mouth every morning. 12/24/14  Yes Mary-Margaret Hassell Done, FNP  risperiDONE (RISPERDAL) 1 MG tablet TAKE ONE TABLET  BY MOUTH AT BEDTIME 07/11/15  Yes Mary-Margaret Hassell Done, FNP  sertraline (ZOLOFT) 100 MG tablet Take 1 tablet (100 mg total) by mouth daily. 06/20/15  Yes Mary-Margaret Hassell Done, FNP  zolpidem (AMBIEN) 10 MG tablet TAKE ONE TABLET BY MOUTH AT BEDTIME 07/07/15  Yes Mary-Margaret Hassell Done, FNP   Physical Exam: Filed Vitals:   07/25/15 2220 07/25/15 2235  BP:  152/56  Pulse: 89 93  Temp:    Resp: 19     BP 152/56 mmHg  Pulse 93  Temp(Src) 99.3 F (37.4 C) (Oral)  Resp 19  SpO2 98%  General Appearance:    Alert, oriented, no distress, appears stated age  Head:    Normocephalic, atraumatic  Eyes:    PERRL, EOMI, sclera non-icteric        Nose:   Nares without drainage or epistaxis. Mucosa, turbinates normal  Throat:   Moist mucous membranes. Oropharynx without erythema or exudate.  Neck:   Supple. No carotid bruits.  No thyromegaly.  No lymphadenopathy.   Back:     No CVA tenderness, no spinal tenderness  Lungs:     Clear to auscultation bilaterally, without wheezes, rhonchi or rales  Chest wall:    No tenderness to palpitation  Heart:    Regular rate and rhythm without murmurs, gallops, rubs  Abdomen:     Soft, non-tender, nondistended, normal bowel sounds, no organomegaly  Genitalia:    deferred  Rectal:    deferred  Extremities:   No clubbing, cyanosis or edema.  Pulses:   2+ and symmetric all extremities  Skin:   Skin color, texture, turgor normal, no rashes or lesions  Lymph nodes:   Cervical, supraclavicular, and axillary nodes normal  Neurologic:   CNII-XII intact. Normal strength, sensation and reflexes      throughout    Labs on Admission:  Basic Metabolic Panel:  Recent Labs Lab 07/25/15 1656  NA 137  K 3.8  CL 108  CO2 17*  GLUCOSE 142*  BUN 36*  CREATININE 1.03*  CALCIUM 9.1  MG 1.6*   Liver Function Tests:  Recent Labs Lab 07/25/15 1656  AST 29  ALT 18  ALKPHOS 43  BILITOT 0.6  PROT 6.6  ALBUMIN 3.9    Recent Labs Lab 07/25/15 1656  LIPASE 27    No results for input(s): AMMONIA in the last 168 hours. CBC:  Recent Labs Lab 07/25/15 1656  WBC 11.4*  NEUTROABS 10.7*  HGB 13.5  HCT 39.8  MCV 91.9  PLT 164   Cardiac Enzymes: No results for input(s): CKTOTAL, CKMB, CKMBINDEX, TROPONINI in the last 168 hours.  BNP (last 3 results) No results for input(s): PROBNP in the last 8760 hours. CBG: No results for input(s): GLUCAP in the last 168 hours.  Radiological Exams on Admission: No results found.  EKG: Independently reviewed.  Assessment/Plan  Active Problems:   Dehydration   Diarrhea   1. Dehydration due to diarrhea - 1. IVF (2L bolus in ED and 125 cc/hr) 2. Imodium 3. Repeat BMP in AM to monitor kidney function. 4. Stool pathogen pnl ordered    Code Status: Full  Family Communication: No family in room Disposition Plan: Admit to obs   Time spent: 50 min  GARDNER, JARED M. [facility] Pager [phone]  If 7AM-7PM, please contact the day team taking care of the patient [email or web address] Password [facility] 07/25/2015, 11:33 PM

## 2015-07-25 NOTE — ED Notes (Signed)
Bed: WA06 Expected date:  Expected time:  Means of arrival:  Comments: Ems-diarrhea

## 2015-07-26 DIAGNOSIS — F329 Major depressive disorder, single episode, unspecified: Secondary | ICD-10-CM | POA: Diagnosis not present

## 2015-07-26 DIAGNOSIS — I10 Essential (primary) hypertension: Secondary | ICD-10-CM

## 2015-07-26 DIAGNOSIS — I951 Orthostatic hypotension: Secondary | ICD-10-CM | POA: Diagnosis not present

## 2015-07-26 DIAGNOSIS — E039 Hypothyroidism, unspecified: Secondary | ICD-10-CM

## 2015-07-26 DIAGNOSIS — R197 Diarrhea, unspecified: Secondary | ICD-10-CM | POA: Diagnosis not present

## 2015-07-26 DIAGNOSIS — E876 Hypokalemia: Secondary | ICD-10-CM | POA: Clinically undetermined

## 2015-07-26 DIAGNOSIS — A09 Infectious gastroenteritis and colitis, unspecified: Secondary | ICD-10-CM | POA: Diagnosis not present

## 2015-07-26 DIAGNOSIS — E860 Dehydration: Secondary | ICD-10-CM

## 2015-07-26 LAB — BASIC METABOLIC PANEL
ANION GAP: 8 (ref 5–15)
BUN: 22 mg/dL — ABNORMAL HIGH (ref 6–20)
CALCIUM: 8.3 mg/dL — AB (ref 8.9–10.3)
CO2: 20 mmol/L — AB (ref 22–32)
Chloride: 110 mmol/L (ref 101–111)
Creatinine, Ser: 0.85 mg/dL (ref 0.44–1.00)
GFR calc Af Amer: >60 mL/min (ref >60)
GFR calc non Af Amer: >60 mL/min (ref >60)
GLUCOSE: 102 mg/dL — AB (ref 65–99)
Potassium: 3.2 mmol/L — ABNORMAL LOW (ref 3.5–5.1)
Sodium: 138 mmol/L (ref 135–145)

## 2015-07-26 LAB — CBC
HCT: 37.2 % (ref 36.0–46.0)
HEMOGLOBIN: 12.6 g/dL (ref 12.0–15.0)
MCH: 31.5 pg (ref 26.0–34.0)
MCHC: 33.9 g/dL (ref 30.0–36.0)
MCV: 93 fL (ref 78.0–100.0)
PLATELETS: 147 10*3/uL — AB (ref 150–400)
RBC: 4 MIL/uL (ref 3.87–5.11)
RDW: 13.6 % (ref 11.5–15.5)
WBC: 7.3 10*3/uL (ref 4.0–10.5)

## 2015-07-26 LAB — MAGNESIUM: Magnesium: 1.6 mg/dL — ABNORMAL LOW (ref 1.7–2.4)

## 2015-07-26 MED ORDER — ACETAMINOPHEN 325 MG PO TABS: 650.0000 mg | ORAL_TABLET | ORAL | Status: DC | PRN

## 2015-07-26 MED ORDER — MAGNESIUM SULFATE 4 GM/100ML IV SOLN
4.0000 g | Freq: Once | INTRAVENOUS | Status: AC
  Administered 2015-07-26: 4 g via INTRAVENOUS
  Filled 2015-07-26: qty 100

## 2015-07-26 MED ORDER — SIMETHICONE 80 MG PO CHEW
160.0000 mg | CHEWABLE_TABLET | Freq: Four times a day (QID) | ORAL | Status: DC
  Administered 2015-07-26 – 2015-07-28 (×7): 160 mg via ORAL
  Filled 2015-07-26 (×10): qty 2

## 2015-07-26 MED ORDER — ONDANSETRON HCL 4 MG/2ML IJ SOLN: 4.0000 mg | Freq: Four times a day (QID) | INTRAMUSCULAR | Status: DC | PRN

## 2015-07-26 MED ORDER — ENSURE ENLIVE PO LIQD
237.0000 mL | Freq: Two times a day (BID) | ORAL | Status: DC
  Administered 2015-07-26 – 2015-07-28 (×5): 237 mL via ORAL

## 2015-07-26 MED ORDER — SODIUM CHLORIDE 0.9 % IV BOLUS (SEPSIS)
1000.0000 mL | Freq: Once | INTRAVENOUS | Status: AC
  Administered 2015-07-26: 1000 mL via INTRAVENOUS

## 2015-07-26 NOTE — Progress Notes (Signed)
[facility] PROGRESS NOTE  Brenda Solis [id number] DOB: 07-30-1934 DOA: 07/25/2015 PCP: Chevis Pretty, FNP  Assessment/Plan: #1 orthostatic hypotension Likely secondary to dehydration secondary to GI losses from diarrhea. We'll give a bolus of normal saline 500 mL 1. Continue IV fluids. Follow.  #2 dehydration Secondary to GI losses. IV fluids. Supportive care. Follow.  #3 hypokalemia/hypomagnesemia Secondary to GI losses. Replete.  #4 diarrhea Likely secondary to a gastroenteritis likely viral in nature. C. difficile PCR is negative. Stool studies pending. Clinical improvement. Simethicone 4 times a day 2 days. Supportive care. Follow.  #5 hypothyroidism TSH was 3.170 on 06/20/2015. Continue home dose Synthroid.  #6 hypertension Toprol-XL.  #7 depression Stable. Continue Zoloft,risperdal.  #8 prophylaxis Lovenox for DVT prophylaxis.  Code Status: Full Family Communication: Updated patient. No family present. Disposition Plan: Home once orthostasis has resolved and diarrhea has improved significantly and patient tolerating oral intake.   Consultants:  None  Procedures:  None  Antibiotics:  None  HPI/Subjective: Patient states feeling weak however improved since admission. Decrease volume of diarrhea. No nausea. No vomiting. Tolerating current diet.  Objective: Filed Vitals:   07/26/15 0504 07/26/15 1409  BP: 122/40 127/52  Pulse: 87 87  Temp: 99.1 F (37.3 C) 99.8 F (37.7 C)  Resp: 18 20    Intake/Output Summary (Last 24 hours) at 07/26/15 1603 Last data filed at 07/26/15 1230  Gross per 24 hour  Intake    120 ml  Output      0 ml  Net    120 ml   Filed Weights   07/26/15 0504  Weight: 83.915 kg (185 lb)    Exam:   General:  NAD  Cardiovascular: RRR  Respiratory: CTAB  Abdomen: Soft, nontender, nondistended, hyperactive bowel sounds.  Musculoskeletal: No clubbing cyanosis or edema.  Data Reviewed: Basic  Metabolic Panel:  Recent Labs Lab 07/25/15 1656 07/26/15 0850  NA 137 138  K 3.8 3.2*  CL 108 110  CO2 17* 20*  GLUCOSE 142* 102*  BUN 36* 22*  CREATININE 1.03* 0.85  CALCIUM 9.1 8.3*  MG 1.6* 1.6*   Liver Function Tests:  Recent Labs Lab 07/25/15 1656  AST 29  ALT 18  ALKPHOS 43  BILITOT 0.6  PROT 6.6  ALBUMIN 3.9    Recent Labs Lab 07/25/15 1656  LIPASE 27   No results for input(s): AMMONIA in the last 168 hours. CBC:  Recent Labs Lab 07/25/15 1656 07/26/15 0850  WBC 11.4* 7.3  NEUTROABS 10.7*  --   HGB 13.5 12.6  HCT 39.8 37.2  MCV 91.9 93.0  PLT 164 147*   Cardiac Enzymes: No results for input(s): CKTOTAL, CKMB, CKMBINDEX, TROPONINI in the last 168 hours. BNP (last 3 results) No results for input(s): BNP in the last 8760 hours.  ProBNP (last 3 results) No results for input(s): PROBNP in the last 8760 hours.  CBG: No results for input(s): GLUCAP in the last 168 hours.  Recent Results (from the past 240 hour(s))  C difficile quick scan w PCR reflex     Status: None   Collection Time: 07/25/15  6:54 PM  Result Value Ref Range Status   C Diff antigen NEGATIVE NEGATIVE Final   C Diff toxin NEGATIVE NEGATIVE Final   C Diff interpretation Negative for toxigenic C. difficile  Final     Studies: No results found.  Scheduled Meds: . atorvastatin  40 mg Oral QHS  . cholecalciferol  2,000 Units Oral Daily  . enoxaparin (LOVENOX)  injection  40 mg Subcutaneous QHS  . feeding supplement (ENSURE ENLIVE)  237 mL Oral BID BM  . levothyroxine  25 mcg Oral QAC breakfast  . magnesium sulfate 1 - 4 g bolus IVPB  4 g Intravenous Once  . meloxicam  15 mg Oral Daily  . metoprolol succinate  25 mg Oral q morning - 10a  . ondansetron (ZOFRAN) IV  4 mg Intravenous Once  . risperiDONE  1 mg Oral QHS  . sertraline  100 mg Oral Daily  . simethicone  160 mg Oral QID  . zolpidem  10 mg Oral QHS   Continuous Infusions: . sodium chloride 125 mL/hr at 07/25/15  2352    Principal Problem:   Orthostatic hypotension Active Problems:   Hypothyroidism   Hypertension   Depression   Dehydration   Diarrhea   Diarrhea   Hypokalemia   Hypomagnesemia    Time spent: 40 mins    [facility] MD [facility] Pager [phone]. If 7PM-7AM, please contact night-coverage at [email or web address], password [facility] 07/26/2015, 4:03 PM

## 2015-07-27 DIAGNOSIS — F329 Major depressive disorder, single episode, unspecified: Secondary | ICD-10-CM | POA: Diagnosis not present

## 2015-07-27 DIAGNOSIS — R197 Diarrhea, unspecified: Secondary | ICD-10-CM | POA: Insufficient documentation

## 2015-07-27 DIAGNOSIS — A09 Infectious gastroenteritis and colitis, unspecified: Secondary | ICD-10-CM

## 2015-07-27 DIAGNOSIS — E860 Dehydration: Secondary | ICD-10-CM | POA: Diagnosis not present

## 2015-07-27 DIAGNOSIS — I951 Orthostatic hypotension: Secondary | ICD-10-CM | POA: Diagnosis not present

## 2015-07-27 LAB — BASIC METABOLIC PANEL
ANION GAP: 6 (ref 5–15)
BUN: 12 mg/dL (ref 6–20)
CALCIUM: 7.9 mg/dL — AB (ref 8.9–10.3)
CO2: 19 mmol/L — ABNORMAL LOW (ref 22–32)
Chloride: 113 mmol/L — ABNORMAL HIGH (ref 101–111)
Creatinine, Ser: 0.71 mg/dL (ref 0.44–1.00)
Glucose, Bld: 95 mg/dL (ref 65–99)
Potassium: 3.4 mmol/L — ABNORMAL LOW (ref 3.5–5.1)
SODIUM: 138 mmol/L (ref 135–145)

## 2015-07-27 LAB — URINE CULTURE

## 2015-07-27 LAB — MAGNESIUM: MAGNESIUM: 2.2 mg/dL (ref 1.7–2.4)

## 2015-07-27 LAB — LACTIC ACID, PLASMA: Lactic Acid, Venous: 0.7 mmol/L (ref 0.5–2.0)

## 2015-07-27 MED ORDER — LOPERAMIDE HCL 2 MG PO CAPS: 2.0000 mg | ORAL_CAPSULE | ORAL | Status: DC | PRN

## 2015-07-27 MED ORDER — POTASSIUM CHLORIDE CRYS ER 20 MEQ PO TBCR
40.0000 meq | EXTENDED_RELEASE_TABLET | Freq: Once | ORAL | Status: AC
  Administered 2015-07-27: 40 meq via ORAL
  Filled 2015-07-27: qty 2

## 2015-07-27 MED ORDER — LOPERAMIDE HCL 2 MG PO CAPS
4.0000 mg | ORAL_CAPSULE | Freq: Once | ORAL | Status: AC
  Administered 2015-07-27: 4 mg via ORAL
  Filled 2015-07-27: qty 2

## 2015-07-27 NOTE — Progress Notes (Signed)
Physical Therapy Evaluation Patient Details Name: Brenda Solis MRN: [id number] DOB: [birth year] Today's Date: 07/27/2015   History of Present Illness  PT admitted by ambulance through ED with c/o diarrhea and dizziness and dx with orthostatic hypotension and dehyration  Clinical Impression  Pt admitted as above and presenting with functional mobility limitations 2* generalized weakness and related ambulatory instability.  Pt plans dc home with 24/7 assist of family.    Follow Up Recommendations No PT follow up    Equipment Recommendations  None recommended by PT    Recommendations for Other Services       Precautions / Restrictions Precautions Precautions: Fall Restrictions Weight Bearing Restrictions: No      Mobility  Bed Mobility Overal bed mobility: Modified Independent             General bed mobility comments: Pt to EOB unassisted  Transfers Overall transfer level: Needs assistance Equipment used: None Transfers: Sit to/from Stand Sit to Stand: Min assist         General transfer comment: Min assist to rise and steady initially.  Cues for use of UEs to self assist  Ambulation/Gait Ambulation/Gait assistance: Min assist;Min guard Ambulation Distance (Feet): 450 Feet Assistive device: Rolling walker (2 wheeled) Gait Pattern/deviations: Step-through pattern;Decreased step length - right;Decreased step length - left;Shuffle;Trunk flexed     General Gait Details: cues for posture and position from RW.  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) Type of Home: House Home Access: Stairs to enter Entrance Stairs-Rails: Right Entrance Stairs-Number of Steps: 2 Home Layout: One level Home Equipment: Rosalia - single point;Shower seat;Walker - 2 wheels;Toilet riser Additional Comments: Pt reports grandaughter can assist as needed "is between jobs"    Prior Function Level of Independence: Independent;Independent with assistive device(s)         Comments: Amb with cane     Hand Dominance   Dominant Hand: Right    Extremity/Trunk Assessment   Upper Extremity Assessment: Generalized weakness           Lower Extremity Assessment: Generalized weakness      Cervical / Trunk Assessment: Normal  Communication   Communication: No difficulties  Cognition Arousal/Alertness: Awake/alert Behavior During Therapy: WFL for tasks assessed/performed Overall Cognitive Status: Within Functional Limits for tasks assessed                      General Comments      Exercises        Assessment/Plan    PT Assessment Patient needs continued PT services  PT Diagnosis Difficulty walking   PT Problem List Decreased strength;Decreased activity tolerance;Decreased balance;Decreased mobility;Decreased knowledge of use of DME  PT Treatment Interventions DME instruction;Gait training;Stair training;Functional mobility training;Therapeutic activities;Therapeutic exercise;Patient/family education   PT Goals (Current goals can  be found in the Care Plan section) Acute Rehab PT Goals Patient Stated Goal: Regain strength and IND PT Goal Formulation: With patient Time For Goal Achievement: 08/09/15 Potential to Achieve Goals: Good    Frequency Min 3X/week   Barriers to discharge        Co-evaluation               End of Session Equipment Utilized During Treatment: Gait belt Activity Tolerance: Patient tolerated treatment well Patient left: in chair;with call  bell/phone within reach;with chair alarm set Nurse Communication: Mobility status    Functional Assessment Tool Used: Clinical judgement Functional Limitation: Mobility: Walking and moving around Mobility: Walking and Moving Around Current Status [phone]): At least 1 percent but less than 20 percent impaired, limited or restricted Mobility: Walking and Moving Around Goal Status [phone]): At least 1 percent but less than 20 percent impaired, limited or restricted    Time: 1005-1036 PT Time Calculation (min) (ACUTE ONLY): 31 min   Charges:   PT Evaluation $PT Eval Low Complexity: 1 Procedure PT Treatments $Gait Training: 8-22 mins   PT G Codes:   PT G-Codes **NOT FOR INPATIENT CLASS** Functional Assessment Tool Used: Clinical judgement Functional Limitation: Mobility: Walking and moving around Mobility: Walking and Moving Around Current Status [phone]): At least 1 percent but less than 20 percent impaired, limited or restricted Mobility: Walking and Moving Around Goal Status [phone]): At least 1 percent but less than 20 percent impaired, limited or restricted    Lesslie Mossa 07/27/2015, 11:44 AM

## 2015-07-27 NOTE — Progress Notes (Signed)
[facility] PROGRESS NOTE  Brenda Solis [id number] DOB: January 16, 1935 DOA: 07/25/2015 PCP: Chevis Pretty, FNP  Assessment/Plan: #1 orthostatic hypotension Likely secondary to dehydration secondary to GI losses from diarrhea. Improved with hydration. Continue IV fluids. Follow.  #2 dehydration Secondary to GI losses. IV fluids. Supportive care. Follow.  #3 hypokalemia/hypomagnesemia Secondary to GI losses. Replete.  #4 diarrhea Likely secondary to a gastroenteritis likely viral in nature. C. difficile PCR is negative. Stool studies pending. Clinical improvement. Simethicone 4 times a day 2 days. Supportive care. Follow.  #5 hypothyroidism TSH was 3.170 on 06/20/2015. Continue home dose Synthroid.  #6 hypertension Toprol-XL.  #7 depression Stable. Continue Zoloft,risperdal.  #8 prophylaxis Lovenox for DVT prophylaxis.  Code Status: Full Family Communication: Updated patient. No family present. Disposition Plan: Home once orthostasis has resolved and diarrhea has improved significantly and patient tolerating oral intake.   Consultants:  None  Procedures:  None  Antibiotics:  None  HPI/Subjective: Patient states c/o feeling gassy. Patient with 2 loose stools today. Weakness improving.   Objective: Filed Vitals:   07/26/15 2152 07/27/15 0702  BP: 136/52 120/47  Pulse: 78 81  Temp: 99.1 F (37.3 C) 97.9 F (36.6 C)  Resp: 20 18    Intake/Output Summary (Last 24 hours) at 07/27/15 1405 Last data filed at 07/27/15 0825  Gross per 24 hour  Intake   1107 ml  Output    700 ml  Net    407 ml   Filed Weights   07/26/15 0504  Weight: 83.915 kg (185 lb)    Exam:   General:  NAD  Cardiovascular: RRR  Respiratory: CTAB  Abdomen: Soft, nontender, nondistended, hyperactive bowel sounds.  Musculoskeletal: No clubbing cyanosis or edema.  Data Reviewed: Basic Metabolic Panel:  Recent Labs Lab 07/25/15 1656 07/26/15 0850  07/27/15 0532  NA 137 138 138  K 3.8 3.2* 3.4*  CL 108 110 113*  CO2 17* 20* 19*  GLUCOSE 142* 102* 95  BUN 36* 22* 12  CREATININE 1.03* 0.85 0.71  CALCIUM 9.1 8.3* 7.9*  MG 1.6* 1.6* 2.2   Liver Function Tests:  Recent Labs Lab 07/25/15 1656  AST 29  ALT 18  ALKPHOS 43  BILITOT 0.6  PROT 6.6  ALBUMIN 3.9    Recent Labs Lab 07/25/15 1656  LIPASE 27   No results for input(s): AMMONIA in the last 168 hours. CBC:  Recent Labs Lab 07/25/15 1656 07/26/15 0850  WBC 11.4* 7.3  NEUTROABS 10.7*  --   HGB 13.5 12.6  HCT 39.8 37.2  MCV 91.9 93.0  PLT 164 147*   Cardiac Enzymes: No results for input(s): CKTOTAL, CKMB, CKMBINDEX, TROPONINI in the last 168 hours. BNP (last 3 results) No results for input(s): BNP in the last 8760 hours.  ProBNP (last 3 results) No results for input(s): PROBNP in the last 8760 hours.  CBG: No results for input(s): GLUCAP in the last 168 hours.  Recent Results (from the past 240 hour(s))  C difficile quick scan w PCR reflex     Status: None   Collection Time: 07/25/15  6:54 PM  Result Value Ref Range Status   C Diff antigen NEGATIVE NEGATIVE Final   C Diff toxin NEGATIVE NEGATIVE Final   C Diff interpretation Negative for toxigenic C. difficile  Final  Urine culture     Status: None   Collection Time: 07/25/15  9:11 PM  Result Value Ref Range Status   Specimen Description URINE, CLEAN CATCH  Final  Special Requests NONE  Final   Culture   Final    MULTIPLE SPECIES PRESENT, SUGGEST RECOLLECTION Performed at [facility]    Report Status 07/27/2015 FINAL  Final     Studies: No results found.  Scheduled Meds: . atorvastatin  40 mg Oral QHS  . cholecalciferol  2,000 Units Oral Daily  . enoxaparin (LOVENOX) injection  40 mg Subcutaneous QHS  . feeding supplement (ENSURE ENLIVE)  237 mL Oral BID BM  . levothyroxine  25 mcg Oral QAC breakfast  . meloxicam  15 mg Oral Daily  . metoprolol succinate  25 mg Oral q  morning - 10a  . ondansetron (ZOFRAN) IV  4 mg Intravenous Once  . risperiDONE  1 mg Oral QHS  . sertraline  100 mg Oral Daily  . simethicone  160 mg Oral QID  . zolpidem  10 mg Oral QHS   Continuous Infusions: . sodium chloride 75 mL/hr at 07/27/15 0600    Principal Problem:   Orthostatic hypotension Active Problems:   Hypothyroidism   Hypertension   Depression   Dehydration   Diarrhea   Diarrhea   Hypokalemia   Hypomagnesemia    Time spent: 40 mins    [facility] MD [facility] Pager [phone]. If 7PM-7AM, please contact night-coverage at [email or web address], password [facility] 07/27/2015, 2:05 PM

## 2015-07-28 DIAGNOSIS — F329 Major depressive disorder, single episode, unspecified: Secondary | ICD-10-CM | POA: Diagnosis not present

## 2015-07-28 DIAGNOSIS — E860 Dehydration: Secondary | ICD-10-CM | POA: Diagnosis not present

## 2015-07-28 DIAGNOSIS — I951 Orthostatic hypotension: Secondary | ICD-10-CM | POA: Diagnosis not present

## 2015-07-28 DIAGNOSIS — A09 Infectious gastroenteritis and colitis, unspecified: Secondary | ICD-10-CM | POA: Diagnosis not present

## 2015-07-28 DIAGNOSIS — R197 Diarrhea, unspecified: Secondary | ICD-10-CM | POA: Diagnosis not present

## 2015-07-28 LAB — CBC
HCT: 34.6 % — ABNORMAL LOW (ref 36.0–46.0)
HEMOGLOBIN: 11.7 g/dL — AB (ref 12.0–15.0)
MCH: 31.4 pg (ref 26.0–34.0)
MCHC: 33.8 g/dL (ref 30.0–36.0)
MCV: 92.8 fL (ref 78.0–100.0)
PLATELETS: 155 10*3/uL (ref 150–400)
RBC: 3.73 MIL/uL — AB (ref 3.87–5.11)
RDW: 13.7 % (ref 11.5–15.5)
WBC: 5.8 10*3/uL (ref 4.0–10.5)

## 2015-07-28 LAB — GASTROINTESTINAL PANEL BY PCR, STOOL (REPLACES STOOL CULTURE)
Adenovirus F40/41: NOT DETECTED
Astrovirus: NOT DETECTED
Campylobacter species: NOT DETECTED
Cryptosporidium: NOT DETECTED
Cyclospora cayetanensis: NOT DETECTED
E. COLI O157: NOT DETECTED
ENTAMOEBA HISTOLYTICA: NOT DETECTED
Enteroaggregative E coli (EAEC): NOT DETECTED
Enteropathogenic E coli (EPEC): NOT DETECTED
Enterotoxigenic E coli (ETEC): NOT DETECTED
GIARDIA LAMBLIA: NOT DETECTED
Norovirus GI/GII: DETECTED — AB
Plesimonas shigelloides: NOT DETECTED
Rotavirus A: NOT DETECTED
SALMONELLA SPECIES: NOT DETECTED
SAPOVIRUS (I, II, IV, AND V): NOT DETECTED
SHIGELLA/ENTEROINVASIVE E COLI (EIEC): NOT DETECTED
Shiga like toxin producing E coli (STEC): NOT DETECTED
VIBRIO CHOLERAE: NOT DETECTED
VIBRIO SPECIES: NOT DETECTED
Yersinia enterocolitica: NOT DETECTED

## 2015-07-28 LAB — BASIC METABOLIC PANEL
Anion gap: 6 (ref 5–15)
BUN: 10 mg/dL (ref 6–20)
CALCIUM: 8.6 mg/dL — AB (ref 8.9–10.3)
CO2: 21 mmol/L — ABNORMAL LOW (ref 22–32)
CREATININE: 0.88 mg/dL (ref 0.44–1.00)
Chloride: 116 mmol/L — ABNORMAL HIGH (ref 101–111)
GFR calc Af Amer: >60 mL/min (ref >60)
Glucose, Bld: 92 mg/dL (ref 65–99)
POTASSIUM: 3.8 mmol/L (ref 3.5–5.1)
SODIUM: 143 mmol/L (ref 135–145)

## 2015-07-28 NOTE — Progress Notes (Signed)
After patient discharged, GI panel came back positive from [facility], positive for norovirus. MD text notified.

## 2015-07-28 NOTE — Evaluation (Signed)
Occupational Therapy Evaluation Patient Details Name: Brenda Solis MRN: [id number] DOB: 1934/07/03 Today's Date: 07/28/2015    History of Present Illness PT admitted by ambulance through ED with c/o diarrhea and dizziness and dx with orthostatic hypotension and dehyration   Clinical Impression   Pt admitted with dehydration. Pt currently with functional limitations due to the deficits listed below (see OT Problem List).  Pt will benefit from skilled OT to increase their safety and independence with ADL and functional mobility for ADL to facilitate discharge to venue listed below.      Follow Up Recommendations  No OT follow up;Supervision/Assistance - 24 hour    Equipment Recommendations  None recommended by OT       Precautions / Restrictions Precautions Precautions: Fall Restrictions Weight Bearing Restrictions: No      Mobility Bed Mobility Overal bed mobility: Modified Independent             General bed mobility comments: Pt to EOB unassisted  Transfers Overall transfer level: Needs assistance Equipment used: None   Sit to Stand: Min assist         General transfer comment: Min assist to rise and steady initially.  Cues for use of UEs to self assist         ADL Overall ADL's : Needs assistance/impaired Eating/Feeding: Independent;Sitting   Grooming: Set up;Sitting   Upper Body Bathing: Set up   Lower Body Bathing: Minimal assistance;Sit to/from stand;Cueing for safety   Upper Body Dressing : Set up;Sitting   Lower Body Dressing: Minimal assistance;Sit to/from stand   Toilet Transfer: Minimal assistance;RW;BSC   Toileting- Clothing Manipulation and Hygiene: Minimal assistance;Sit to/from stand         General ADL Comments: pt states children can swap around and be with her initially.               Pertinent Vitals/Pain Pain Assessment: No/denies pain     Hand Dominance Right   Extremity/Trunk Assessment Upper Extremity  Assessment Upper Extremity Assessment: Generalized weakness       Cervical / Trunk Assessment Cervical / Trunk Assessment: Normal   Communication Communication Communication: No difficulties   Cognition Arousal/Alertness: Awake/alert Behavior During Therapy: WFL for tasks assessed/performed Overall Cognitive Status: Within Functional Limits for tasks assessed                     General Comments   OT encouraged pt to have her children be with her as needed as she is regaining her strength            Home Living Family/patient expects to be discharged to:: Private residence Living Arrangements: Alone Available Help at Discharge: Family;Available 24 hours/day (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 who is between jobs) Type of Home: House Home Access: Stairs to enter Technical brewer of Steps: 2 Entrance Stairs-Rails: Right Home Layout: One level     Bathroom Shower/Tub: Corporate investment banker: [place] - single point;Shower seat;Walker - 2 wheels;Toilet riser   Additional Comments: Pt reports grandaughter can assist as needed "is between jobs"      Prior Functioning/Environment Level of Independence: Independent;Independent with assistive device(s)        Comments: Amb with cane    OT Diagnosis: Generalized weakness   OT Problem List: Decreased activity tolerance;Decreased strength   OT Treatment/Interventions: Self-care/ADL training;DME and/or AE instruction;Visual/perceptual remediation/compensation    OT Goals(Current goals can be found in the care  plan section) Acute Rehab OT Goals Patient Stated Goal: Regain strength and IND OT Goal Formulation: With patient Time For Goal Achievement: 08/04/15 Potential to Achieve Goals: Good  OT Frequency: Min 2X/week              End of Session Nurse Communication: Mobility status  Activity Tolerance: Patient tolerated treatment well Patient left: in  chair   Time: [id number] OT Time Calculation (min): 16 min Charges:  OT General Charges $OT Visit: 1 Procedure OT Evaluation $OT Eval Low Complexity: 1 Procedure G-Codes: OT G-codes **NOT FOR INPATIENT CLASS** Functional Assessment Tool Used: clinical observation Functional Limitation: Self care Self Care Goal Status [id number]): At least 1 percent but less than 20 percent impaired, limited or restricted  [name], Thereasa Parkin 07/28/2015, 12:09 PM

## 2015-07-28 NOTE — Progress Notes (Signed)
Initial Nutrition Assessment  DOCUMENTATION CODES:   Not applicable  INTERVENTION:  - Continue Ensure Enlive po BID, each supplement provides 350 kcal and 20 grams of protein - RD will continue to monitor for needs  NUTRITION DIAGNOSIS:   Inadequate oral intake related to acute illness, nausea as evidenced by per patient/family report.  GOAL:   Patient will meet greater than or equal to 90% of their needs  MONITOR:   PO intake, Supplement acceptance, Weight trends, Labs, Skin, I & O's  REASON FOR ASSESSMENT:   Malnutrition Screening Tool  ASSESSMENT:   81 y.o. female who presents to the ED with innumerable episodes of diarrhea. Diarrhea has been going on for the past 1 day, no h/o ABx use recently, sick contacts, suspicious food intake. Diarrhea is watery. Continues while in ED. Onset at 3AM last night. Later today she began to develop dizziness.  Pt seen for MST. BMI indicates overweight/borderline obesity. Per chart review, pt ate 50% of breakfast and 100% of lunch yesterday (1/29) and 25% of breakfast this AM. She states that for lunch today she ate a baked sweet potato and that this in the most that she has eaten in the past 3-4 days. Pt states that usually she does "not have a big appetite" and states that she typically drinks Boost for breakfast, eats a light lunch, and eats "whatever" for dinner. She states that since Friday (1/27) she has been having ongoing nausea with associated ongoing diarrhea and that appetite has been further decreased during this time. She denies any episodes of emesis associated with nausea.  Pt states she has been provided with Ensure since admission and is fine with this rather than switching to Boost Plus. She states UBW "in the high 180s" and feels that she may have lost a few pounds recently. Per chart review, pt has lost 4 lbs (2% body weight) in the past 1 month which is not significant for time frame. If this weight loss occurred in the  past 3 days then it would be considered significant; will continue to monitor weight trends during hospitalization. No muscle or fat wasting noted during physical assessment.  Pt not meeting needs x3-4 days. Medications reviewed. Labs reviewed; Cl: 112 mmol/L, Ca: 8.6 mg/dL.   Diet Order:  Diet regular Room service appropriate?: Yes; Fluid consistency:: Thin  Skin:  Wound (see comment) (R hand ecchymosis, Bilateral coccyx excoriation)  Last BM:  1/29  Height:   Ht Readings from Last 1 Encounters:  07/26/15 5\' 6"  (1.676 m)    Weight:   Wt Readings from Last 1 Encounters:  07/26/15 185 lb (83.915 kg)    Ideal Body Weight:  59.09 kg (kg)  BMI:  Body mass index is 29.87 kg/(m^2).  Estimated Nutritional Needs:   Kcal:  1300-1500  Protein:  55-65 grams  Fluid:  >/= 2 L/day  EDUCATION NEEDS:   No education needs identified at this time     Jarome Matin, RD, LDN Inpatient Clinical Dietitian Pager # [phone] After hours/weekend pager # [phone]

## 2015-07-28 NOTE — Discharge Summary (Signed)
Physician Discharge Summary  Brenda Solis [id number] DOB: [birth year] DOA: 07/25/2015  PCP: Chevis Pretty, FNP  Admit date: 07/25/2015 Discharge date: 07/28/2015  Time spent: 65 minutes  Recommendations for Outpatient Follow-up:  1. Follow-up with Chevis Pretty, FNP A1 to 2 weeks. On follow-up patient will benefit from a basic metabolic profile to follow-up on electrolytes and renal function. Patient also benefit from a magnesium levels.   Discharge Diagnoses:  Principal Problem:   Orthostatic hypotension Active Problems:   Hypothyroidism   Hypertension   Depression   Dehydration   Diarrhea   Diarrhea   Hypokalemia   Hypomagnesemia   Diarrhea of presumed infectious origin   Discharge Condition: Stable and improved  Diet recommendation: Regular  Filed Weights   07/26/15 0504  Weight: 83.915 kg (185 lb)    History of present illness:  Per Dr Moishe Spice is a 81 y.o. female who presented to the ED with innumerable episodes of diarrhea. Diarrhea had been going on for the past 1 day, no h/o ABx use recently, sick contacts, suspicious food intake. Diarrhea was watery. Continued while in ED. Onset at 3AM the night prior to admission. Later on in the day she began to develop dizziness.  In the ED she was confirmed to have orthostatic hypotension with tachycardia of 128 and BP dropped significantly when standing (per EMS report as low as 0000000 systolic, in ED dropped to [id number] systolic still), BP and HR lying down are 140s and 90s respectively.   Hospital Course:  #1 orthostatic hypotension Patient was admitted with orthostatic hypotension felt to be secondary to GI losses from diarrhea. Patient was hydrated with IV fluids and monitored. Patient's orthostatic hypotension improved and patient was asymptomatic by day of discharge. Patient will be discharged home in stable and improved condition and is to follow-up with PCP as outpatient.   #2  dehydration Secondary to GI losses. Patient was placed on IV fluids and supportive care was euvolemic by day of discharge.   #3 hypokalemia/hypomagnesemia Secondary to GI losses. Replete. Patient will need repeat blood work to follow-up on electrolytes.  #4 diarrhea Likely secondary to a gastroenteritis likely viral in nature. C. difficile PCR was negative. Stool studies were ordered and pending at the time of discharge. Patient was placed on IV fluids, supportive care. Patient improved clinically during the hospitalization and patient's diarrhea improved. Patient will be discharged home in stable and improved condition and is to follow-up with PCP as outpatient.   #5 hypothyroidism TSH was 3.170 on 06/20/2015. Continued on home dose Synthroid.  #6 hypertension Toprol-XL.  #7 depression Stable. Continued on home regimen of Zoloft,risperdal.  Procedures:  None  Consultations:  None  Discharge Exam: Filed Vitals:   07/28/15 0930 07/28/15 1400  BP: 137/56 144/59  Pulse: 66 65  Temp: 98.2 F (36.8 C) 98 F (36.7 C)  Resp: 14 14    General: NAD Cardiovascular: RRR Respiratory: CTAB  Discharge Instructions   Discharge Instructions    Diet general    Complete by:  As directed      Discharge instructions    Complete by:  As directed   Follow up with Chevis Pretty, FNP in 1-2 weeks.     Increase activity slowly    Complete by:  As directed           Current Discharge Medication List    CONTINUE these medications which have NOT CHANGED   Details  ALPRAZolam (XANAX) 0.25 MG tablet TAKE ONE TABLET  BY MOUTH AT BEDTIME AS NEEDED Qty: 30 tablet, Refills: 1    atorvastatin (LIPITOR) 40 MG tablet Take 1 tablet (40 mg total) by mouth at bedtime. Qty: 30 tablet, Refills: 5   Associated Diagnoses: Hyperlipidemia with target LDL less than 100    cholecalciferol (VITAMIN D) 1000 UNITS tablet Take 2,000 Units by mouth daily.      HYDROcodone-acetaminophen (NORCO)  10-325 MG per tablet Take 1 tablet by mouth every 6 (six) hours as needed (hold for sedation). Qty: 60 tablet, Refills: 0    levothyroxine (SYNTHROID, LEVOTHROID) 25 MCG tablet Take 1 tablet (25 mcg total) by mouth daily before breakfast. Qty: 30 tablet, Refills: 11   Associated Diagnoses: Hypothyroidism, unspecified hypothyroidism type    meclizine (ANTIVERT) 25 MG tablet TAKE ONE TABLET BY MOUTH THREE TIMES DAILY AS NEEDED FOR DIZZINESS Qty: 30 tablet, Refills: 2    meloxicam (MOBIC) 15 MG tablet TAKE 1 TABLET (15 MG TOTAL) BY MOUTH DAILY. Qty: 30 tablet, Refills: 2    metoprolol succinate (TOPROL-XL) 25 MG 24 hr tablet Take 1 tablet (25 mg total) by mouth every morning. Qty: 30 tablet, Refills: 5   Associated Diagnoses: Essential hypertension    risperiDONE (RISPERDAL) 1 MG tablet TAKE ONE TABLET BY MOUTH AT BEDTIME Qty: 30 tablet, Refills: 0    sertraline (ZOLOFT) 100 MG tablet Take 1 tablet (100 mg total) by mouth daily. Qty: 30 tablet, Refills: 3   Associated Diagnoses: Depression    zolpidem (AMBIEN) 10 MG tablet TAKE ONE TABLET BY MOUTH AT BEDTIME Qty: 30 tablet, Refills: 0       Allergies  Allergen Reactions  . Gabapentin Other (See Comments)    Dizzy  . Oxycodone Itching   Follow-up Information    Follow up with Chevis Pretty, FNP. Schedule an appointment as soon as possible for a visit in 1 week.   Specialty:  Family Medicine   Contact information:   [place] [id number] [phone]        The results of significant diagnostics from this hospitalization (including imaging, microbiology, ancillary and laboratory) are listed below for reference.    Significant Diagnostic Studies: No results found.  Microbiology: Recent Results (from the past 240 hour(s))  C difficile quick scan w PCR reflex     Status: None   Collection Time: 07/25/15  6:54 PM  Result Value Ref Range Status   C Diff antigen NEGATIVE NEGATIVE Final   C Diff  toxin NEGATIVE NEGATIVE Final   C Diff interpretation Negative for toxigenic C. difficile  Final  Urine culture     Status: None   Collection Time: 07/25/15  9:11 PM  Result Value Ref Range Status   Specimen Description URINE, CLEAN CATCH  Final   Special Requests NONE  Final   Culture   Final    MULTIPLE SPECIES PRESENT, SUGGEST RECOLLECTION Performed at [facility]    Report Status 07/27/2015 FINAL  Final     Labs: Basic Metabolic Panel:  Recent Labs Lab 07/25/15 1656 07/26/15 0850 07/27/15 0532 07/28/15 0440  NA 137 138 138 143  K 3.8 3.2* 3.4* 3.8  CL 108 110 113* 116*  CO2 17* 20* 19* 21*  GLUCOSE 142* 102* 95 92  BUN 36* 22* 12 10  CREATININE 1.03* 0.85 0.71 0.88  CALCIUM 9.1 8.3* 7.9* 8.6*  MG 1.6* 1.6* 2.2  --    Liver Function Tests:  Recent Labs Lab 07/25/15 1656  AST 29  ALT  18  ALKPHOS 43  BILITOT 0.6  PROT 6.6  ALBUMIN 3.9    Recent Labs Lab 07/25/15 1656  LIPASE 27   No results for input(s): AMMONIA in the last 168 hours. CBC:  Recent Labs Lab 07/25/15 1656 07/26/15 0850 07/28/15 0440  WBC 11.4* 7.3 5.8  NEUTROABS 10.7*  --   --   HGB 13.5 12.6 11.7*  HCT 39.8 37.2 34.6*  MCV 91.9 93.0 92.8  PLT 164 147* 155   Cardiac Enzymes: No results for input(s): CKTOTAL, CKMB, CKMBINDEX, TROPONINI in the last 168 hours. BNP: BNP (last 3 results) No results for input(s): BNP in the last 8760 hours.  ProBNP (last 3 results) No results for input(s): PROBNP in the last 8760 hours.  CBG: No results for input(s): GLUCAP in the last 168 hours.     SignedIrine Seal MD.  [facility] 07/28/2015, 3:18 PM

## 2015-07-28 NOTE — Care Management Note (Signed)
Case Management Note  Patient Details  Name: Brenda Solis MRN: [id number] Date of Birth: 1934/10/26  Subjective/Objective:  81 y/o f admitted w/orthostatic hypotension. From home.                  Action/Plan:d/c home no needs or orders.   Expected Discharge Date:                  Expected Discharge Plan:  Home/Self Care  In-House Referral:     Discharge planning Services  CM Consult  Post Acute Care Choice:    Choice offered to:     DME Arranged:    DME Agency:     HH Arranged:    [place] Agency:     Status of Service:  Completed, signed off  Medicare Important Message Given:    Date Medicare IM Given:    Medicare IM give by:    Date Additional Medicare IM Given:    Additional Medicare Important Message give by:     If discussed at [place] of Stay Meetings, dates discussed:    Additional Comments:  Dessa Phi, RN 07/28/2015, 3:26 PM

## 2015-07-30 ENCOUNTER — Telehealth: Payer: Self-pay | Admitting: *Deleted

## 2015-07-30 NOTE — Telephone Encounter (Signed)
Call Completed and Appointment Scheduled: Yes, Date: 08/08/15 with Chevis Pretty, FNP   DISCHARGE INFORMATION Date of Discharge:07/28/15  Discharge Facility: [facility]  Principal Discharge Diagnosis: Dehydration  Patient and/or caregiver is knowledgeable of his/her condition(s) and treatment: Yes   MEDICATION RECONCILIATION Medication list reviewed with patient: Yes  Patient is able to obtain needed medications: Yes   ACTIVITIES OF DAILY LIVING  Is the patient able to perform his/her own ADLs: Yes  Patient is receiving home health services: no   PATIENT EDUCATION Questions/Concerns Discussed: No concerns at this time. Patient is feeling better and is trying to rest and regain strength. Will call if she needs anything before her appt.

## 2015-08-08 ENCOUNTER — Ambulatory Visit (INDEPENDENT_AMBULATORY_CARE_PROVIDER_SITE_OTHER): Payer: Medicare Other | Admitting: Nurse Practitioner

## 2015-08-08 ENCOUNTER — Encounter: Payer: Self-pay | Admitting: Nurse Practitioner

## 2015-08-08 VITALS — BP 117/68 | HR 76 | Temp 97.3°F | Ht 66.0 in | Wt 187.6 lb

## 2015-08-08 DIAGNOSIS — F329 Major depressive disorder, single episode, unspecified: Secondary | ICD-10-CM | POA: Diagnosis not present

## 2015-08-08 DIAGNOSIS — Z09 Encounter for follow-up examination after completed treatment for conditions other than malignant neoplasm: Secondary | ICD-10-CM | POA: Diagnosis not present

## 2015-08-08 DIAGNOSIS — F32A Depression, unspecified: Secondary | ICD-10-CM

## 2015-08-08 NOTE — Patient Instructions (Signed)
Stress and Stress Management Stress is a normal reaction to life events. It is what you feel when life demands more than you are used to or more than you can handle. Some stress can be useful. For example, the stress reaction can help you catch the last bus of the day, study for a test, or meet a deadline at work. But stress that occurs too often or for too long can cause problems. It can affect your emotional health and interfere with relationships and normal daily activities. Too much stress can weaken your immune system and increase your risk for physical illness. If you already have a medical problem, stress can make it worse. CAUSES  All sorts of life events may cause stress. An event that causes stress for one person may not be stressful for another person. Major life events commonly cause stress. These may be positive or negative. Examples include losing your job, moving into a new home, getting married, having a baby, or losing a loved one. Less obvious life events may also cause stress, especially if they occur day after day or in combination. Examples include working long hours, driving in traffic, caring for children, being in debt, or being in a difficult relationship. SIGNS AND SYMPTOMS Stress may cause emotional symptoms including, the following:  Anxiety. This is feeling worried, afraid, on edge, overwhelmed, or out of control.  Anger. This is feeling irritated or impatient.  Depression. This is feeling sad, down, helpless, or guilty.  Difficulty focusing, remembering, or making decisions. Stress may cause physical symptoms, including the following:   Aches and pains. These may affect your head, neck, back, stomach, or other areas of your body.  Tight muscles or clenched jaw.  Low energy or trouble sleeping. Stress may cause unhealthy behaviors, including the following:   Eating to feel better (overeating) or skipping meals.  Sleeping too little, too much, or both.  Working  too much or putting off tasks (procrastination).  Smoking, drinking alcohol, or using drugs to feel better. DIAGNOSIS  Stress is diagnosed through an assessment by your health care provider. Your health care provider will ask questions about your symptoms and any stressful life events.Your health care provider will also ask about your medical history and may order blood tests or other tests. Certain medical conditions and medicine can cause physical symptoms similar to stress. Mental illness can cause emotional symptoms and unhealthy behaviors similar to stress. Your health care provider may refer you to a mental health professional for further evaluation.  TREATMENT  Stress management is the recommended treatment for stress.The goals of stress management are reducing stressful life events and coping with stress in healthy ways.  Techniques for reducing stressful life events include the following:  Stress identification. Self-monitor for stress and identify what causes stress for you. These skills may help you to avoid some stressful events.  Time management. Set your priorities, keep a calendar of events, and learn to say "no." These tools can help you avoid making too many commitments. Techniques for coping with stress include the following:  Rethinking the problem. Try to think realistically about stressful events rather than ignoring them or overreacting. Try to find the positives in a stressful situation rather than focusing on the negatives.  Exercise. Physical exercise can release both physical and emotional tension. The key is to find a form of exercise you enjoy and do it regularly.  Relaxation techniques. These relax the body and mind. Examples include yoga, meditation, tai chi, biofeedback, deep  breathing, progressive muscle relaxation, listening to music, being out in nature, journaling, and other hobbies. Again, the key is to find one or more that you enjoy and can do  regularly.  Healthy lifestyle. Eat a balanced diet, get plenty of sleep, and do not smoke. Avoid using alcohol or drugs to relax.  Strong support network. Spend time with family, friends, or other people you enjoy being around.Express your feelings and talk things over with someone you trust. Counseling or talktherapy with a mental health professional may be helpful if you are having difficulty managing stress on your own. Medicine is typically not recommended for the treatment of stress.Talk to your health care provider if you think you need medicine for symptoms of stress. HOME CARE INSTRUCTIONS  Keep all follow-up visits as directed by your health care provider.  Take all medicines as directed by your health care provider. SEEK MEDICAL CARE IF:  Your symptoms get worse or you start having new symptoms.  You feel overwhelmed by your problems and can no longer manage them on your own. SEEK IMMEDIATE MEDICAL CARE IF:  You feel like hurting yourself or someone else.   This information is not intended to replace advice given to you by your health care provider. Make sure you discuss any questions you have with your health care provider.   Document Released: 12/08/2000 Document Revised: 07/05/2014 Document Reviewed: 02/06/2013 Elsevier Interactive Patient Education 2016 Elsevier Inc.  

## 2015-08-08 NOTE — Addendum Note (Signed)
Addended by: Chevis Pretty on: 08/08/2015 02:27 PM   Modules accepted: Level of Service

## 2015-08-08 NOTE — Progress Notes (Addendum)
   Subjective:    Patient ID: Brenda Solis, female    DOB: 1934-08-01, 81 y.o.   MRN: [id number]  HPI Patient comes in today for transitional care from hospital- She was in hospital with severe dehydration from gastroenteritis. She was there for 4 days. She is feeling much better but still does not have energy and legs feel weak. No vomiting or diarrhea.   Depression- not sure zoloft is really working for her - the only other thing she has tried is citalopram.  Today's visit is for Transitional Care Management.  The patient was discharged from [facility] on 07/28/15 with a primary diagnosis of dehydration.   Contact with the patient and/or caregiver, by a clinical staff member, was made on 07/30/15 and was documented as a telephone encounter within the EMR.  Through chart review and discussion with the patient I have determined that management of their condition is of low complexity.        Review of Systems  Constitutional: Negative.   HENT: Negative.   Respiratory: Negative.   Cardiovascular: Negative.   Genitourinary: Negative.   Neurological: Negative.   Psychiatric/Behavioral: Negative.   All other systems reviewed and are negative.      Objective:   Physical Exam  Constitutional: She is oriented to person, place, and time. She appears well-developed and well-nourished.  Cardiovascular: Normal rate, regular rhythm and normal heart sounds.   Pulmonary/Chest: Effort normal and breath sounds normal.  Neurological: She is alert and oriented to person, place, and time.  Skin: Skin is warm.  Psychiatric: She has a normal mood and affect. Her behavior is normal. Judgment and thought content normal.    BP 117/68 mmHg  Pulse 76  Temp(Src) 97.3 F (36.3 C) (Oral)  Ht 5\' 6"  (1.676 m)  Wt 187 lb 9.6 oz (85.095 kg)  BMI 30.29 kg/m2  Depression screen [facility] 2/9 08/08/2015 06/20/2015 11/05/2014 10/16/2014 07/09/2014  Decreased Interest 2 3 0 0 0  Down, Depressed, Hopeless 2 3 0 0  0  PHQ - 2 Score 4 6 0 0 0  Altered sleeping 0 2 - - -  Tired, decreased energy 2 3 - - -  Change in appetite 1 2 - - -  Feeling bad or failure about yourself  0 1 - - -  Trouble concentrating 0 1 - - -  Moving slowly or fidgety/restless 0 1 - - -  Suicidal thoughts 0 0 - - -  PHQ-9 Score 7 16 - - -  Difficult doing work/chores - Very difficult - - -          Assessment & Plan:  1. Hospital discharge follow-up hospital records reviewed Continue all meds Offer physical therapy but patient denied- to expensive  2. Depression Patient decided to remain on zoloft since PSQ9 was better since starting [facility] will let me know if she changes her mind and wabts to try something else Stress management discussed  Mary-Margaret Hassell Done, FNP

## 2015-08-11 DIAGNOSIS — H61001 Unspecified perichondritis of right external ear: Secondary | ICD-10-CM | POA: Diagnosis not present

## 2015-08-11 DIAGNOSIS — D225 Melanocytic nevi of trunk: Secondary | ICD-10-CM | POA: Diagnosis not present

## 2015-08-11 DIAGNOSIS — H61009 Unspecified perichondritis of external ear, unspecified ear: Secondary | ICD-10-CM | POA: Diagnosis not present

## 2015-08-11 DIAGNOSIS — D485 Neoplasm of uncertain behavior of skin: Secondary | ICD-10-CM | POA: Diagnosis not present

## 2015-08-18 ENCOUNTER — Other Ambulatory Visit: Payer: Self-pay | Admitting: Nurse Practitioner

## 2015-09-18 ENCOUNTER — Other Ambulatory Visit: Payer: Self-pay | Admitting: Nurse Practitioner

## 2015-09-22 ENCOUNTER — Other Ambulatory Visit: Payer: Self-pay | Admitting: *Deleted

## 2015-09-22 MED ORDER — RISPERIDONE 1 MG PO TABS: 1.0000 mg | ORAL_TABLET | Freq: Every day | ORAL | Status: DC

## 2015-09-25 ENCOUNTER — Other Ambulatory Visit: Payer: Self-pay

## 2015-09-25 DIAGNOSIS — E039 Hypothyroidism, unspecified: Secondary | ICD-10-CM

## 2015-09-25 MED ORDER — LEVOTHYROXINE SODIUM 25 MCG PO TABS: 25.0000 ug | ORAL_TABLET | Freq: Every day | ORAL | Status: DC

## 2015-10-06 ENCOUNTER — Other Ambulatory Visit: Payer: Self-pay | Admitting: Nurse Practitioner

## 2015-10-06 NOTE — Telephone Encounter (Signed)
Please call in ZOLPIDEM AND XANAX with 1 refills

## 2015-10-06 NOTE — Telephone Encounter (Signed)
Ambien last filled 3/13 Alprazolam last filled 06/05/15. If approved please route to pool and have nurse call into pharmacy.

## 2015-11-05 ENCOUNTER — Other Ambulatory Visit: Payer: Self-pay | Admitting: Nurse Practitioner

## 2015-11-06 ENCOUNTER — Telehealth: Payer: Self-pay

## 2015-11-06 MED ORDER — TRAZODONE HCL 50 MG PO TABS: 50.0000 mg | ORAL_TABLET | Freq: Every evening | ORAL | Status: DC | PRN

## 2015-11-06 NOTE — Telephone Encounter (Signed)
Trazodone 50-100 mg Prescription sent to pharmacy. Insurance denied zolpidem.

## 2015-11-06 NOTE — Telephone Encounter (Signed)
Insurance denied prior authorization for Zolpidem  Will only pay for 90 in 365 days due to being a high risk medication in persons over 79 years of age

## 2015-11-06 NOTE — Telephone Encounter (Signed)
Aware of new trazodone script.

## 2015-11-13 ENCOUNTER — Other Ambulatory Visit: Payer: Self-pay | Admitting: Nurse Practitioner

## 2016-01-05 ENCOUNTER — Other Ambulatory Visit: Payer: Self-pay | Admitting: Nurse Practitioner

## 2016-01-06 NOTE — Telephone Encounter (Signed)
Last seen 08/08/15  MMM

## 2016-01-09 ENCOUNTER — Other Ambulatory Visit: Payer: Self-pay | Admitting: Nurse Practitioner

## 2016-01-09 MED ORDER — METOPROLOL SUCCINATE ER 25 MG PO TB24: ORAL_TABLET | ORAL | Status: DC

## 2016-02-06 ENCOUNTER — Other Ambulatory Visit: Payer: Self-pay | Admitting: Family

## 2016-02-20 ENCOUNTER — Ambulatory Visit: Payer: Medicare Other | Admitting: Nurse Practitioner

## 2016-02-23 ENCOUNTER — Encounter: Payer: Self-pay | Admitting: Nurse Practitioner

## 2016-02-23 ENCOUNTER — Ambulatory Visit (INDEPENDENT_AMBULATORY_CARE_PROVIDER_SITE_OTHER): Payer: Medicare Other | Admitting: Nurse Practitioner

## 2016-02-23 VITALS — BP 126/59 | HR 67 | Temp 97.6°F | Ht 66.0 in | Wt 198.0 lb

## 2016-02-23 DIAGNOSIS — R609 Edema, unspecified: Secondary | ICD-10-CM | POA: Diagnosis not present

## 2016-02-23 DIAGNOSIS — F32A Depression, unspecified: Secondary | ICD-10-CM

## 2016-02-23 DIAGNOSIS — F329 Major depressive disorder, single episode, unspecified: Secondary | ICD-10-CM | POA: Diagnosis not present

## 2016-02-23 MED ORDER — FUROSEMIDE 20 MG PO TABS: 20.0000 mg | ORAL_TABLET | Freq: Once | ORAL | Status: DC

## 2016-02-23 MED ORDER — ESCITALOPRAM OXALATE 20 MG PO TABS: 20.0000 mg | ORAL_TABLET | Freq: Every day | ORAL | 5 refills | Status: DC

## 2016-02-23 NOTE — Patient Instructions (Signed)
Stress and Stress Management Stress is a normal reaction to life events. It is what you feel when life demands more than you are used to or more than you can handle. Some stress can be useful. For example, the stress reaction can help you catch the last bus of the day, study for a test, or meet a deadline at work. But stress that occurs too often or for too long can cause problems. It can affect your emotional health and interfere with relationships and normal daily activities. Too much stress can weaken your immune system and increase your risk for physical illness. If you already have a medical problem, stress can make it worse. CAUSES  All sorts of life events may cause stress. An event that causes stress for one person may not be stressful for another person. Major life events commonly cause stress. These may be positive or negative. Examples include losing your job, moving into a new home, getting married, having a baby, or losing a loved one. Less obvious life events may also cause stress, especially if they occur day after day or in combination. Examples include working long hours, driving in traffic, caring for children, being in debt, or being in a difficult relationship. SIGNS AND SYMPTOMS Stress may cause emotional symptoms including, the following:  Anxiety. This is feeling worried, afraid, on edge, overwhelmed, or out of control.  Anger. This is feeling irritated or impatient.  Depression. This is feeling sad, down, helpless, or guilty.  Difficulty focusing, remembering, or making decisions. Stress may cause physical symptoms, including the following:   Aches and pains. These may affect your head, neck, back, stomach, or other areas of your body.  Tight muscles or clenched jaw.  Low energy or trouble sleeping. Stress may cause unhealthy behaviors, including the following:   Eating to feel better (overeating) or skipping meals.  Sleeping too little, too much, or both.  Working  too much or putting off tasks (procrastination).  Smoking, drinking alcohol, or using drugs to feel better. DIAGNOSIS  Stress is diagnosed through an assessment by your health care provider. Your health care provider will ask questions about your symptoms and any stressful life events.Your health care provider will also ask about your medical history and may order blood tests or other tests. Certain medical conditions and medicine can cause physical symptoms similar to stress. Mental illness can cause emotional symptoms and unhealthy behaviors similar to stress. Your health care provider may refer you to a mental health professional for further evaluation.  TREATMENT  Stress management is the recommended treatment for stress.The goals of stress management are reducing stressful life events and coping with stress in healthy ways.  Techniques for reducing stressful life events include the following:  Stress identification. Self-monitor for stress and identify what causes stress for you. These skills may help you to avoid some stressful events.  Time management. Set your priorities, keep a calendar of events, and learn to say "no." These tools can help you avoid making too many commitments. Techniques for coping with stress include the following:  Rethinking the problem. Try to think realistically about stressful events rather than ignoring them or overreacting. Try to find the positives in a stressful situation rather than focusing on the negatives.  Exercise. Physical exercise can release both physical and emotional tension. The key is to find a form of exercise you enjoy and do it regularly.  Relaxation techniques. These relax the body and mind. Examples include yoga, meditation, tai chi, biofeedback, deep  breathing, progressive muscle relaxation, listening to music, being out in nature, journaling, and other hobbies. Again, the key is to find one or more that you enjoy and can do  regularly.  Healthy lifestyle. Eat a balanced diet, get plenty of sleep, and do not smoke. Avoid using alcohol or drugs to relax.  Strong support network. Spend time with family, friends, or other people you enjoy being around.Express your feelings and talk things over with someone you trust. Counseling or talktherapy with a mental health professional may be helpful if you are having difficulty managing stress on your own. Medicine is typically not recommended for the treatment of stress.Talk to your health care provider if you think you need medicine for symptoms of stress. HOME CARE INSTRUCTIONS  Keep all follow-up visits as directed by your health care provider.  Take all medicines as directed by your health care provider. SEEK MEDICAL CARE IF:  Your symptoms get worse or you start having new symptoms.  You feel overwhelmed by your problems and can no longer manage them on your own. SEEK IMMEDIATE MEDICAL CARE IF:  You feel like hurting yourself or someone else.   This information is not intended to replace advice given to you by your health care provider. Make sure you discuss any questions you have with your health care provider.   Document Released: 12/08/2000 Document Revised: 07/05/2014 Document Reviewed: 02/06/2013 Elsevier Interactive Patient Education 2016 Elsevier Inc.  

## 2016-02-23 NOTE — Progress Notes (Signed)
   Subjective:    Patient ID: Brenda Solis, female    DOB: 11-01-1934, 81 y.o.   MRN: [id number]  HPI Patient comes in today c/o : -ankles swelling. The swelling started several weeks ago- seems to be worse when she is up on her feet. Goes down at night time. Denies SOB or chest pain. - still depressed- Says she just can't seem to get out of her Brenda Solis. She thinks it is because of her back pain that has not improved despite surgery so sheis not able to get out and do what she wants to do.  Rates pain average 3-6/10 . Activity increases pain. SHe des not feel like zoloft is no longer helping.   Review of Systems  Constitutional: Negative.   HENT: Negative.   Respiratory: Negative.   Cardiovascular: Positive for leg swelling (only right ankle).  Gastrointestinal: Negative.   Genitourinary: Negative.   Musculoskeletal: Negative.   Neurological: Negative.   Psychiatric/Behavioral: Negative.  Negative for suicidal ideas.  All other systems reviewed and are negative.      Objective:   Physical Exam  Constitutional: She appears well-developed and well-nourished. No distress.  Cardiovascular: Normal rate and normal heart sounds.   Pulmonary/Chest: Effort normal and breath sounds normal.  Musculoskeletal: She exhibits edema (mild edema right ankle.).  Skin: Skin is warm.  Psychiatric: She has a normal mood and affect. Her behavior is normal. Judgment and thought content normal.   BP (!) 126/59   Pulse 67   Temp 97.6 F (36.4 C) (Oral)   Ht '5\' 6"'$  (1.676 m)   Wt 198 lb (89.8 kg)   BMI 31.96 kg/m         Assessment & Plan:  1. Depression Stop zoloft- changed to lexapro 1 daily Stress management discused - escitalopram (LEXAPRO) 20 MG tablet; Take 1 tablet (20 mg total) by mouth daily.  Dispense: 30 tablet; Refill: 5  2. Peripheral edema Elevate leg when sitting Labs pending - furosemide (LASIX) tablet 20 mg; Take 1 tablet (20 mg total) by mouth once. - BMP8+EGFR  RTO  prn  Mary-Margaret Hassell Done, FNP

## 2016-02-24 ENCOUNTER — Other Ambulatory Visit: Payer: Self-pay | Admitting: Nurse Practitioner

## 2016-02-24 ENCOUNTER — Telehealth: Payer: Self-pay | Admitting: Nurse Practitioner

## 2016-02-24 DIAGNOSIS — R609 Edema, unspecified: Secondary | ICD-10-CM

## 2016-02-24 MED ORDER — BUMETANIDE 1 MG PO TABS: 1.0000 mg | ORAL_TABLET | Freq: Every day | ORAL | 2 refills | Status: DC

## 2016-02-24 NOTE — Telephone Encounter (Signed)
Saw you yesterday and you prescribed LASIX for her ankle swelling, but [facility] did not have it.  Could you call it in for her to pick up today?  Thank you.

## 2016-02-24 NOTE — Telephone Encounter (Signed)
It is on back order- I will have to order something else- will [facility]

## 2016-03-06 ENCOUNTER — Other Ambulatory Visit: Payer: Self-pay | Admitting: Nurse Practitioner

## 2016-04-05 ENCOUNTER — Other Ambulatory Visit: Payer: Self-pay | Admitting: Nurse Practitioner

## 2016-04-14 ENCOUNTER — Other Ambulatory Visit: Payer: Self-pay | Admitting: Nurse Practitioner

## 2016-04-24 ENCOUNTER — Ambulatory Visit (INDEPENDENT_AMBULATORY_CARE_PROVIDER_SITE_OTHER): Payer: Medicare Other | Admitting: Physician Assistant

## 2016-04-24 ENCOUNTER — Encounter: Payer: Self-pay | Admitting: Physician Assistant

## 2016-04-24 VITALS — BP 138/76 | HR 66 | Temp 98.5°F | Ht 66.0 in | Wt 201.4 lb

## 2016-04-24 DIAGNOSIS — J209 Acute bronchitis, unspecified: Secondary | ICD-10-CM | POA: Diagnosis not present

## 2016-04-24 DIAGNOSIS — J029 Acute pharyngitis, unspecified: Secondary | ICD-10-CM

## 2016-04-24 MED ORDER — BENZONATATE 200 MG PO CAPS: 200.0000 mg | ORAL_CAPSULE | Freq: Two times a day (BID) | ORAL | 0 refills | Status: DC | PRN

## 2016-04-24 MED ORDER — METHYLPREDNISOLONE ACETATE 80 MG/ML IJ SUSP
80.0000 mg | Freq: Once | INTRAMUSCULAR | Status: AC
  Administered 2016-04-24: 80 mg via INTRAMUSCULAR

## 2016-04-24 MED ORDER — CEFDINIR 300 MG PO CAPS: 300.0000 mg | ORAL_CAPSULE | Freq: Two times a day (BID) | ORAL | 0 refills | Status: DC

## 2016-04-24 MED ORDER — AZITHROMYCIN 250 MG PO TABS
ORAL_TABLET | ORAL | 0 refills | Status: DC
Start: 2016-04-24 — End: 2016-04-24

## 2016-04-24 NOTE — Patient Instructions (Signed)

## 2016-04-24 NOTE — Progress Notes (Addendum)
BP (!) 145/72   Pulse 80   Temp 98.5 F (36.9 C) (Oral)   Ht 5\' 6"  (1.676 m)   Wt 201 lb 6.4 oz (91.4 kg)   BMI 32.51 kg/m    Subjective:    Patient ID: Brenda Solis, female    DOB: [birth year], 81 y.o.   MRN: [id number]  HPI: Brenda Solis is a 81 y.o. female presenting on 04/24/2016 for Sore Throat; Nasal Congestion; and Hoarse  Patient has had symptoms for several days now. They're getting worse with her cough and congestion. She has had a history of bronchitis in the past. She denies any severe fever or chills. She is not having any production of her cough.  Relevant past medical, surgical, family and social history reviewed and updated as indicated. Allergies and medications reviewed and updated.  Past Medical History:  Diagnosis Date  . Depression   . Heart murmur   . Hyperlipidemia   . Hypertension   . Hypothyroidism   . Insomnia, unspecified   . Spondylosis of unspecified site without mention of myelopathy   . Thyroid disease    hypothyroidism    Past Surgical History:  Procedure Laterality Date  . APPENDECTOMY    . CATARACT EXTRACTION, BILATERAL Bilateral 2006  . [place]  . [place]  . CHOLECYSTECTOMY  1990  . DECOMPRESSIVE LUMBAR LAMINECTOMY LEVEL 1 N/A 02/01/2013   Procedure: CENTRAL DECOMPRESSIVE LUMBAR LAMINECTOMY LEVEL 1 L4-L5;  Surgeon: Tobi Bastos, MD;  Location: [facility];  Service: Orthopedics;  Laterality: N/A;  . DECOMPRESSIVE LUMBAR LAMINECTOMY LEVEL 1 N/A 11/20/2014   Procedure: REVISION L4-S1 DECOMPRESSION, L4-L5 IN SITU FUSION (LEVEL 1);  Surgeon: Melina Schools, MD;  Location: [facility];  Service: Orthopedics;  Laterality: N/A;  . KNEE SURGERY Right 2009   meniscus repair  . TOTAL HIP ARTHROPLASTY  2011   RIGHT    Review of Systems  Constitutional: Positive for fatigue. Negative for chills and fever.  HENT: Positive for congestion, postnasal drip and sore throat.   Eyes: Negative.   Respiratory:  Positive for cough and wheezing.   Cardiovascular: Negative.   Gastrointestinal: Negative.   Genitourinary: Negative.       Medication List       Accurate as of 04/24/16  9:30 AM. Always use your most recent med list.          ALPRAZolam 0.25 MG tablet Commonly known as:  XANAX TAKE ONE TABLET BY MOUTH AT BEDTIME   azithromycin 250 MG tablet Commonly known as:  ZITHROMAX Z-PAK As directed   benzonatate 200 MG capsule Commonly known as:  TESSALON Take 1 capsule (200 mg total) by mouth 2 (two) times daily as needed for cough.   bumetanide 1 MG tablet Commonly known as:  BUMEX Take 1 tablet (1 mg total) by mouth daily.   cholecalciferol 1000 units tablet Commonly known as:  VITAMIN D Take 2,000 Units by mouth daily.   escitalopram 20 MG tablet Commonly known as:  LEXAPRO Take 1 tablet (20 mg total) by mouth daily.   levothyroxine 25 MCG tablet Commonly known as:  SYNTHROID, LEVOTHROID Take 1 tablet (25 mcg total) by mouth daily before breakfast.   meclizine 25 MG tablet Commonly known as:  ANTIVERT TAKE ONE TABLET BY MOUTH THREE TIMES DAILY AS NEEDED FOR DIZZINESS   meloxicam 15 MG tablet Commonly known as:  MOBIC TAKE 1 TABLET (15 MG TOTAL) BY MOUTH DAILY.  metoprolol succinate 25 MG 24 hr tablet Commonly known as:  TOPROL-XL TAKE 1 TABLET (25 MG TOTAL) BY MOUTH EVERY MORNING.   risperiDONE 1 MG tablet Commonly known as:  RISPERDAL TAKE 1 TABLET (1 MG TOTAL) BY MOUTH AT BEDTIME.   risperiDONE 1 MG tablet Commonly known as:  RISPERDAL TAKE 1 TABLET (1 MG TOTAL) BY MOUTH AT BEDTIME.   traZODone 50 MG tablet Commonly known as:  DESYREL TAKE 1-2 TABLETS (50-100 MG TOTAL) BY MOUTH AT BEDTIME AS NEEDED FOR SLEEP.          Objective:    BP (!) 145/72   Pulse 80   Temp 98.5 F (36.9 C) (Oral)   Ht 5\' 6"  (1.676 m)   Wt 201 lb 6.4 oz (91.4 kg)   BMI 32.51 kg/m   Allergies  Allergen Reactions  . Gabapentin Other (See Comments)    Dizzy  .  Oxycodone Itching    Physical Exam  Constitutional: She is oriented to person, place, and time. She appears well-developed and well-nourished.  HENT:  Head: Normocephalic and atraumatic.  Right Ear: There is drainage and tenderness.  Left Ear: There is drainage and tenderness.  Nose: Mucosal edema and rhinorrhea present. Right sinus exhibits maxillary sinus tenderness and frontal sinus tenderness. Left sinus exhibits maxillary sinus tenderness and frontal sinus tenderness.  Mouth/Throat: Oropharyngeal exudate and posterior oropharyngeal erythema present.  Eyes: Conjunctivae and EOM are normal. Pupils are equal, round, and reactive to light.  Neck: Normal range of motion. Neck supple.  Cardiovascular: Normal rate, regular rhythm, normal heart sounds and intact distal pulses.   Pulmonary/Chest: Effort normal. She has wheezes in the right upper field and the left upper field.  Abdominal: Soft. Bowel sounds are normal.  Neurological: She is alert and oriented to person, place, and time. She has normal reflexes.  Skin: Skin is warm and dry. No rash noted.  Psychiatric: She has a normal mood and affect. Her behavior is normal. Judgment and thought content normal.        Assessment & Plan:   1. Sore throat  2. Acute bronchitis, unspecified organism - methylPREDNISolone acetate (DEPO-MEDROL) injection 80 mg; Inject 1 mL (80 mg total) into the muscle once. -omnicef 300 mg 1 tab po BID 10 days - benzonatate (TESSALON) 200 MG capsule; Take 1 capsule (200 mg total) by mouth 2 (two) times daily as needed for cough.  Dispense: 20 capsule; Refill: 0   Continue all other maintenance medications as listed above.  Follow up plan: When necessary worsening or nonimprovement of symptoms.  Educational handout given for bronchitis  Terald Sleeper PA-C [facility] [address]  [place], [place] [address] [phone]   04/24/2016, 9:30 AM

## 2016-04-24 NOTE — Addendum Note (Signed)
Addended by: Terald Sleeper on: 04/24/2016 10:24 AM   Modules accepted: Orders

## 2016-05-03 ENCOUNTER — Other Ambulatory Visit: Payer: Self-pay | Admitting: *Deleted

## 2016-05-03 DIAGNOSIS — F329 Major depressive disorder, single episode, unspecified: Secondary | ICD-10-CM

## 2016-05-03 DIAGNOSIS — F32A Depression, unspecified: Secondary | ICD-10-CM

## 2016-05-03 MED ORDER — ESCITALOPRAM OXALATE 20 MG PO TABS
20.0000 mg | ORAL_TABLET | Freq: Every day | ORAL | 1 refills | Status: DC
Start: 2016-05-03 — End: 2016-09-22

## 2016-05-03 NOTE — Addendum Note (Signed)
Addended by: Thana Ates on: 05/03/2016 09:41 AM   Modules accepted: Orders

## 2016-05-04 MED ORDER — TRAZODONE HCL 50 MG PO TABS: 50.0000 mg | ORAL_TABLET | Freq: Every evening | ORAL | 0 refills | Status: DC | PRN

## 2016-05-04 MED ORDER — RISPERIDONE 1 MG PO TABS: ORAL_TABLET | ORAL | 0 refills | Status: DC

## 2016-05-06 ENCOUNTER — Ambulatory Visit (INDEPENDENT_AMBULATORY_CARE_PROVIDER_SITE_OTHER): Payer: Medicare Other | Admitting: *Deleted

## 2016-05-06 DIAGNOSIS — Z23 Encounter for immunization: Secondary | ICD-10-CM

## 2016-05-29 ENCOUNTER — Other Ambulatory Visit: Payer: Self-pay | Admitting: Nurse Practitioner

## 2016-06-07 ENCOUNTER — Other Ambulatory Visit: Payer: Self-pay | Admitting: *Deleted

## 2016-06-07 ENCOUNTER — Other Ambulatory Visit: Payer: Self-pay | Admitting: Nurse Practitioner

## 2016-06-07 MED ORDER — TRAZODONE HCL 50 MG PO TABS: 50.0000 mg | ORAL_TABLET | Freq: Every evening | ORAL | 0 refills | Status: DC | PRN

## 2016-07-03 ENCOUNTER — Other Ambulatory Visit: Payer: Self-pay | Admitting: Nurse Practitioner

## 2016-07-06 ENCOUNTER — Other Ambulatory Visit: Payer: Self-pay

## 2016-07-06 MED ORDER — MELOXICAM 15 MG PO TABS: 15.0000 mg | ORAL_TABLET | Freq: Every day | ORAL | 0 refills | Status: DC

## 2016-07-30 ENCOUNTER — Other Ambulatory Visit: Payer: Self-pay | Admitting: *Deleted

## 2016-07-30 MED ORDER — MELOXICAM 15 MG PO TABS: 15.0000 mg | ORAL_TABLET | Freq: Every day | ORAL | 0 refills | Status: DC

## 2016-08-10 ENCOUNTER — Other Ambulatory Visit: Payer: Self-pay | Admitting: Nurse Practitioner

## 2016-09-22 ENCOUNTER — Encounter: Payer: Self-pay | Admitting: Physician Assistant

## 2016-09-22 ENCOUNTER — Ambulatory Visit (INDEPENDENT_AMBULATORY_CARE_PROVIDER_SITE_OTHER): Payer: Medicare Other | Admitting: Physician Assistant

## 2016-09-22 VITALS — BP 136/64 | HR 58 | Temp 98.4°F | Ht 66.0 in | Wt 201.6 lb

## 2016-09-22 DIAGNOSIS — E039 Hypothyroidism, unspecified: Secondary | ICD-10-CM | POA: Diagnosis not present

## 2016-09-22 DIAGNOSIS — Z0000 Encounter for general adult medical examination without abnormal findings: Secondary | ICD-10-CM | POA: Diagnosis not present

## 2016-09-22 DIAGNOSIS — F329 Major depressive disorder, single episode, unspecified: Secondary | ICD-10-CM | POA: Diagnosis not present

## 2016-09-22 DIAGNOSIS — F32A Depression, unspecified: Secondary | ICD-10-CM

## 2016-09-22 MED ORDER — RISPERIDONE 1 MG PO TABS: ORAL_TABLET | ORAL | 0 refills | Status: DC

## 2016-09-22 MED ORDER — ESCITALOPRAM OXALATE 20 MG PO TABS: 20.0000 mg | ORAL_TABLET | Freq: Every day | ORAL | 1 refills | Status: DC

## 2016-09-22 MED ORDER — BUPROPION HCL ER (XL) 150 MG PO TB24: 150.0000 mg | ORAL_TABLET | Freq: Every day | ORAL | 0 refills | Status: DC

## 2016-09-22 NOTE — Progress Notes (Signed)
BP 136/64   Pulse (!) 58   Temp 98.4 F (36.9 C) (Oral)   Ht 5' 6"  (1.676 m)   Wt 201 lb 9.6 oz (91.4 kg)   BMI 32.54 kg/m    Subjective:    Patient ID: Brenda Solis, female    DOB: 16-Jun-1935, 82 y.o.   MRN: [id number]  HPI: Brenda Solis is a 82 y.o. female presenting on 09/22/2016 for Depression  Depression screen [facility] 2/9 09/22/2016 04/24/2016 02/23/2016 08/08/2015 06/20/2015  Decreased Interest 3 0 3 2 3   Down, Depressed, Hopeless 3 0 3 2 3   PHQ - 2 Score 6 0 6 4 6   Altered sleeping 2 - 0 0 2  Tired, decreased energy 2 - 3 2 3   Change in appetite 0 - 1 1 2   Feeling bad or failure about yourself  1 - 0 0 1  Trouble concentrating 0 - 0 0 1  Moving slowly or fidgety/restless 2 - 0 0 1  Suicidal thoughts 0 - 0 0 0  PHQ-9 Score 13 - 10 7 16   Difficult doing work/chores - - Very difficult - Very difficult    She is having much worse depression in recent months.  She deals with chronic back pain, surgery performed by Rolena Infante and [employer].  She has tried and failed Zoloft, Effexor and Paxil in the past.  She has never tried Wellbutrin.  Relevant past medical, surgical, family and social history reviewed and updated as indicated. Allergies and medications reviewed and updated.  Past Medical History:  Diagnosis Date  . Depression   . Heart murmur   . Hyperlipidemia   . Hypertension   . Hypothyroidism   . Insomnia, unspecified   . Spondylosis of unspecified site without mention of myelopathy   . Thyroid disease    hypothyroidism    Past Surgical History:  Procedure Laterality Date  . APPENDECTOMY    . CATARACT EXTRACTION, BILATERAL Bilateral 2006  . [place]  . [place]  . CHOLECYSTECTOMY  1990  . DECOMPRESSIVE LUMBAR LAMINECTOMY LEVEL 1 N/A 02/01/2013   Procedure: CENTRAL DECOMPRESSIVE LUMBAR LAMINECTOMY LEVEL 1 L4-L5;  Surgeon: Tobi Bastos, MD;  [facility];  Service: Orthopedics;  Laterality: N/A;  . DECOMPRESSIVE  LUMBAR LAMINECTOMY LEVEL 1 N/A 11/20/2014   Procedure: REVISION L4-S1 DECOMPRESSION, L4-L5 IN SITU FUSION (LEVEL 1);  Surgeon: Melina Schools, MD;  Location: [facility];  Service: Orthopedics;  Laterality: N/A;  . KNEE SURGERY Right 2009   meniscus repair  . TOTAL HIP ARTHROPLASTY  2011   RIGHT    Review of Systems  Constitutional: Negative.  Negative for activity change, fatigue and fever.  HENT: Negative.   Eyes: Negative.   Respiratory: Negative.  Negative for cough.   Cardiovascular: Negative.  Negative for chest pain.  Gastrointestinal: Negative.  Negative for abdominal pain.  Endocrine: Negative.   Genitourinary: Negative.  Negative for dysuria.  Musculoskeletal: Negative.   Skin: Negative.   Neurological: Negative.   Psychiatric/Behavioral: Positive for decreased concentration and dysphoric mood. Negative for self-injury, sleep disturbance and suicidal ideas. The patient is nervous/anxious.     Allergies as of 09/22/2016      Reactions   Gabapentin Other (See Comments)   Dizzy   Oxycodone Itching      Medication List       Accurate as of 09/22/16 11:59 PM. Always use your most recent med list.  ALPRAZolam 0.25 MG tablet Commonly known as:  XANAX TAKE ONE TABLET BY MOUTH AT BEDTIME   bumetanide 1 MG tablet Commonly known as:  BUMEX Take 1 tablet (1 mg total) by mouth daily.   buPROPion 150 MG 24 hr tablet Commonly known as:  WELLBUTRIN XL Take 1 tablet (150 mg total) by mouth daily.   cholecalciferol 1000 units tablet Commonly known as:  VITAMIN D Take 2,000 Units by mouth daily.   escitalopram 20 MG tablet Commonly known as:  LEXAPRO Take 1 tablet (20 mg total) by mouth daily.   levothyroxine 25 MCG tablet Commonly known as:  SYNTHROID, LEVOTHROID Take 1 tablet (25 mcg total) by mouth daily before breakfast.   meclizine 25 MG tablet Commonly known as:  ANTIVERT TAKE ONE TABLET BY MOUTH THREE TIMES DAILY AS NEEDED FOR DIZZINESS   meloxicam 15 MG  tablet Commonly known as:  MOBIC Take 1 tablet (15 mg total) by mouth daily.   metoprolol succinate 25 MG 24 hr tablet Commonly known as:  TOPROL-XL TAKE 1 TABLET (25 MG TOTAL) BY MOUTH EVERY MORNING.   metoprolol succinate 25 MG 24 hr tablet Commonly known as:  TOPROL-XL TAKE 1 TABLET (25 MG TOTAL) BY MOUTH EVERY MORNING.   risperiDONE 1 MG tablet Commonly known as:  RISPERDAL TAKE 1/2 TABLET BY MOUTH AT BEDTIME.   traZODone 50 MG tablet Commonly known as:  DESYREL Take 1-2 tablets (50-100 mg total) by mouth at bedtime as needed for sleep.          Objective:    BP 136/64   Pulse (!) 58   Temp 98.4 F (36.9 C) (Oral)   Ht '5\' 6"'$  (1.676 m)   Wt 201 lb 9.6 oz (91.4 kg)   BMI 32.54 kg/m   Allergies  Allergen Reactions  . Gabapentin Other (See Comments)    Dizzy  . Oxycodone Itching    Physical Exam  Constitutional: She is oriented to person, place, and time. She appears well-developed and well-nourished.  HENT:  Head: Normocephalic and atraumatic.  Eyes: Conjunctivae and EOM are normal. Pupils are equal, round, and reactive to light.  Cardiovascular: Normal rate, regular rhythm, normal heart sounds and intact distal pulses.   Pulmonary/Chest: Effort normal and breath sounds normal.  Abdominal: Soft. Bowel sounds are normal.  Neurological: She is alert and oriented to person, place, and time. She has normal reflexes.  Skin: Skin is warm and dry. No rash noted.  Psychiatric: She has a normal mood and affect. Her behavior is normal. Judgment and thought content normal.    Results for orders placed or performed in visit on 09/22/16  CBC with Differential/Platelet  Result Value Ref Range   WBC 7.1 3.4 - 10.8 x10E3/uL   RBC 4.22 3.77 - 5.28 x10E6/uL   Hemoglobin 12.7 11.1 - 15.9 g/dL   Hematocrit 38.8 34.0 - 46.6 %   MCV 92 79 - 97 fL   MCH 30.1 26.6 - 33.0 pg   MCHC 32.7 31.5 - 35.7 g/dL   RDW 13.7 12.3 - 15.4 %   Platelets 179 150 - 379 x10E3/uL    Neutrophils 48 Not Estab. %   Lymphs 42 Not Estab. %   Monocytes 8 Not Estab. %   Eos 2 Not Estab. %   Basos 0 Not Estab. %   Neutrophils Absolute 3.4 1.4 - 7.0 x10E3/uL   Lymphocytes Absolute 3.0 0.7 - 3.1 x10E3/uL   Monocytes Absolute 0.6 0.1 - 0.9 x10E3/uL   EOS (ABSOLUTE)  0.1 0.0 - 0.4 x10E3/uL   Basophils Absolute 0.0 0.0 - 0.2 x10E3/uL   Immature Granulocytes 0 Not Estab. %   Immature Grans (Abs) 0.0 0.0 - 0.1 x10E3/uL  CMP14+EGFR  Result Value Ref Range   Glucose 79 65 - 99 mg/dL   BUN 26 8 - 27 mg/dL   Creatinine, Ser 0.99 0.57 - 1.00 mg/dL   GFR calc non Af Amer 54 (L) >59 mL/min/1.73   GFR calc Af Amer 63 >59 mL/min/1.73   BUN/Creatinine Ratio 26 12 - 28   Sodium 140 134 - 144 mmol/L   Potassium 5.0 3.5 - 5.2 mmol/L   Chloride 102 96 - 106 mmol/L   CO2 23 18 - 29 mmol/L   Calcium 9.7 8.7 - 10.3 mg/dL   Total Protein 6.6 6.0 - 8.5 g/dL   Albumin 4.2 3.5 - 4.8 g/dL   Globulin, Total 2.4 1.5 - 4.5 g/dL   Albumin/Globulin Ratio 1.8 1.2 - 2.2   Bilirubin Total 0.2 0.0 - 1.2 mg/dL   Alkaline Phosphatase 54 39 - 117 IU/L   AST 33 0 - 40 IU/L   ALT 18 0 - 32 IU/L  Lipid panel  Result Value Ref Range   Cholesterol, Total 191 100 - 199 mg/dL   Triglycerides 200 (H) 0 - 149 mg/dL   HDL 37 (L) >39 mg/dL   VLDL Cholesterol Cal 40 5 - 40 mg/dL   LDL Calculated 114 (H) 0 - 99 mg/dL   Chol/HDL Ratio 5.2 (H) 0.0 - 4.4 ratio units  Thyroid Panel With TSH  Result Value Ref Range   TSH 3.660 0.450 - 4.500 uIU/mL   T4, Total 7.6 4.5 - 12.0 ug/dL   T3 Uptake Ratio 26 24 - 39 %   Free Thyroxine Index 2.0 1.2 - 4.9      Assessment & Plan:   1. Depression, unspecified depression type - risperiDONE (RISPERDAL) 1 MG tablet; TAKE 1/2 TABLET BY MOUTH AT BEDTIME.  Dispense: 90 tablet; Refill: 0 - escitalopram (LEXAPRO) 20 MG tablet; Take 1 tablet (20 mg total) by mouth daily.  Dispense: 90 tablet; Refill: 1 - buPROPion (WELLBUTRIN XL) 150 MG 24 hr tablet; Take 1 tablet (150 mg  total) by mouth daily.  Dispense: 90 tablet; Refill: 0  2. Well adult exam - CBC with Differential/Platelet - CMP14+EGFR - Lipid panel - Thyroid Panel With TSH  3. Acquired hypothyroidism - CBC with Differential/Platelet - CMP14+EGFR - Thyroid Panel With TSH   Continue all other maintenance medications as listed above.  Follow up plan: Return in about 4 weeks (around 10/20/2016) for recheck.  Educational handout given for depression  Terald Sleeper PA-C [facility] [address]  [place], [place] [address] [phone]   09/26/2016, 9:36 PM

## 2016-09-22 NOTE — Patient Instructions (Signed)
Hypothyroidism Hypothyroidism is a disorder of the thyroid. The thyroid is a large gland that is located in the lower front of the neck. The thyroid releases hormones that control how the body works. With hypothyroidism, the thyroid does not make enough of these hormones. What are the causes? Causes of hypothyroidism may include:  Viral infections.  Pregnancy.  Your own defense system (immune system) attacking your thyroid.  Certain medicines.  Birth defects.  Past radiation treatments to your head or neck.  Past treatment with radioactive iodine.  Past surgical removal of part or all of your thyroid.  Problems with the gland that is located in the center of your brain (pituitary).  What are the signs or symptoms? Signs and symptoms of hypothyroidism may include:  Feeling as though you have no energy (lethargy).  Inability to tolerate cold.  Weight gain that is not explained by a change in diet or exercise habits.  Dry skin.  Coarse hair.  Menstrual irregularity.  Slowing of thought processes.  Constipation.  Sadness or depression.  How is this diagnosed? Your health care provider may diagnose hypothyroidism with blood tests and ultrasound tests. How is this treated? Hypothyroidism is treated with medicine that replaces the hormones that your body does not make. After you begin treatment, it may take several weeks for symptoms to go away. Follow these instructions at home:  Take medicines only as directed by your health care provider.  If you start taking any new medicines, tell your health care provider.  Keep all follow-up visits as directed by your health care provider. This is important. As your condition improves, your dosage needs may change. You will need to have blood tests regularly so that your health care provider can watch your condition. Contact a health care provider if:  Your symptoms do not get better with treatment.  You are taking thyroid  replacement medicine and: ? You sweat excessively. ? You have tremors. ? You feel anxious. ? You lose weight rapidly. ? You cannot tolerate heat. ? You have emotional swings. ? You have diarrhea. ? You feel weak. Get help right away if:  You develop chest pain.  You develop an irregular heartbeat.  You develop a rapid heartbeat. This information is not intended to replace advice given to you by your health care provider. Make sure you discuss any questions you have with your health care provider. Document Released: 06/14/2005 Document Revised: 11/20/2015 Document Reviewed: 10/30/2013 Elsevier Interactive Patient Education  2017 [employer]vier Inc.  

## 2016-09-23 LAB — CBC WITH DIFFERENTIAL/PLATELET
BASOS ABS: 0 10*3/uL (ref 0.0–0.2)
Basos: 0 %
EOS (ABSOLUTE): 0.1 10*3/uL (ref 0.0–0.4)
Eos: 2 %
Hematocrit: 38.8 % (ref 34.0–46.6)
Hemoglobin: 12.7 g/dL (ref 11.1–15.9)
Immature Grans (Abs): 0 10*3/uL (ref 0.0–0.1)
Immature Granulocytes: 0 %
LYMPHS ABS: 3 10*3/uL (ref 0.7–3.1)
Lymphs: 42 %
MCH: 30.1 pg (ref 26.6–33.0)
MCHC: 32.7 g/dL (ref 31.5–35.7)
MCV: 92 fL (ref 79–97)
MONOS ABS: 0.6 10*3/uL (ref 0.1–0.9)
Monocytes: 8 %
NEUTROS ABS: 3.4 10*3/uL (ref 1.4–7.0)
Neutrophils: 48 %
PLATELETS: 179 10*3/uL (ref 150–379)
RBC: 4.22 x10E6/uL (ref 3.77–5.28)
RDW: 13.7 % (ref 12.3–15.4)
WBC: 7.1 10*3/uL (ref 3.4–10.8)

## 2016-09-23 LAB — THYROID PANEL WITH TSH
Free Thyroxine Index: 2 (ref 1.2–4.9)
T3 Uptake Ratio: 26 % (ref 24–39)
T4 TOTAL: 7.6 ug/dL (ref 4.5–12.0)
TSH: 3.66 u[IU]/mL (ref 0.450–4.500)

## 2016-09-23 LAB — CMP14+EGFR
ALBUMIN: 4.2 g/dL (ref 3.5–4.8)
ALK PHOS: 54 IU/L (ref 39–117)
ALT: 18 IU/L (ref 0–32)
AST: 33 IU/L (ref 0–40)
Albumin/Globulin Ratio: 1.8 (ref 1.2–2.2)
BILIRUBIN TOTAL: 0.2 mg/dL (ref 0.0–1.2)
BUN / CREAT RATIO: 26 (ref 12–28)
BUN: 26 mg/dL (ref 8–27)
CO2: 23 mmol/L (ref 18–29)
Calcium: 9.7 mg/dL (ref 8.7–10.3)
Chloride: 102 mmol/L (ref 96–106)
Creatinine, Ser: 0.99 mg/dL (ref 0.57–1.00)
GFR calc Af Amer: 63 mL/min/{1.73_m2} (ref >59)
GFR calc non Af Amer: 54 mL/min/{1.73_m2} — ABNORMAL LOW (ref >59)
GLUCOSE: 79 mg/dL (ref 65–99)
Globulin, Total: 2.4 g/dL (ref 1.5–4.5)
Potassium: 5 mmol/L (ref 3.5–5.2)
Sodium: 140 mmol/L (ref 134–144)
Total Protein: 6.6 g/dL (ref 6.0–8.5)

## 2016-09-23 LAB — LIPID PANEL
CHOLESTEROL TOTAL: 191 mg/dL (ref 100–199)
Chol/HDL Ratio: 5.2 ratio units — ABNORMAL HIGH (ref 0.0–4.4)
HDL: 37 mg/dL — ABNORMAL LOW (ref >39)
LDL Calculated: 114 mg/dL — ABNORMAL HIGH (ref 0–99)
Triglycerides: 200 mg/dL — ABNORMAL HIGH (ref 0–149)
VLDL CHOLESTEROL CAL: 40 mg/dL (ref 5–40)

## 2016-09-27 ENCOUNTER — Other Ambulatory Visit: Payer: Self-pay | Admitting: *Deleted

## 2016-09-27 MED ORDER — LEVOTHYROXINE SODIUM 25 MCG PO TABS: 25.0000 ug | ORAL_TABLET | Freq: Every day | ORAL | 1 refills | Status: DC

## 2016-10-20 ENCOUNTER — Ambulatory Visit (INDEPENDENT_AMBULATORY_CARE_PROVIDER_SITE_OTHER): Payer: Medicare Other | Admitting: Physician Assistant

## 2016-10-20 ENCOUNTER — Encounter: Payer: Self-pay | Admitting: Physician Assistant

## 2016-10-20 DIAGNOSIS — F329 Major depressive disorder, single episode, unspecified: Secondary | ICD-10-CM

## 2016-10-20 DIAGNOSIS — F32A Depression, unspecified: Secondary | ICD-10-CM

## 2016-10-20 MED ORDER — BUPROPION HCL ER (XL) 300 MG PO TB24: 300.0000 mg | ORAL_TABLET | Freq: Every day | ORAL | 1 refills | Status: DC

## 2016-10-20 NOTE — Progress Notes (Signed)
BP (!) 115/51   Pulse (!) 56   Temp 98.2 F (36.8 C) (Oral)   Ht 5\' 6"  (1.676 m)   Wt 199 lb 12.8 oz (90.6 kg)   BMI 32.25 kg/m    Subjective:    Patient ID: Brenda Solis, female    DOB: 14-Oct-1934, 82 y.o.   MRN: [id number]  HPI: Brenda Solis is a 82 y.o. female presenting on 10/20/2016 for Follow-up (4 week rck on depression )  This patient comes in for periodic recheck on medications and conditions including depression and new medication. Reports tolerating the Wellbutrin 150 mg total. She is down 2 pounds. She states she does not feel nervous or anxious on it whatsoever. She did decrease the risperidone half a tablet at bed. She does not notice significant significant difference from that. In the past week she has gone to to social events. Her family did take her with them. She states she was very tired after she did this. But she did enjoy going to them.   All medications are reviewed today. There are no reports of any problems with the medications. All of the medical conditions are reviewed and updated.  Lab work is reviewed and will be ordered as medically necessary. There are no new problems reported with today's visit. Depression screen [facility] 2/9 10/20/2016 09/22/2016 04/24/2016 02/23/2016 08/08/2015  Decreased Interest 2 3 0 3 2  Down, Depressed, Hopeless 2 3 0 3 2  PHQ - 2 Score 4 6 0 6 4  Altered sleeping 3 2 - 0 0  Tired, decreased energy 3 2 - 3 2  Change in appetite 0 0 - 1 1  Feeling bad or failure about yourself  2 1 - 0 0  Trouble concentrating 0 0 - 0 0  Moving slowly or fidgety/restless 2 2 - 0 0  Suicidal thoughts 0 0 - 0 0  PHQ-9 Score 14 13 - 10 7  Difficult doing work/chores - - - Very difficult -     Relevant past medical, surgical, family and social history reviewed and updated as indicated. Allergies and medications reviewed and updated.  Past Medical History:  Diagnosis Date  . Depression   . Heart murmur   . Hyperlipidemia   . Hypertension   .  Hypothyroidism   . Insomnia, unspecified   . Spondylosis of unspecified site without mention of myelopathy   . Thyroid disease    hypothyroidism    Past Surgical History:  Procedure Laterality Date  . APPENDECTOMY    . CATARACT EXTRACTION, BILATERAL Bilateral 2006  . [place]  . [place]  . CHOLECYSTECTOMY  1990  . DECOMPRESSIVE LUMBAR LAMINECTOMY LEVEL 1 N/A 02/01/2013   Procedure: CENTRAL DECOMPRESSIVE LUMBAR LAMINECTOMY LEVEL 1 L4-L5;  Surgeon: Tobi Bastos, MD;  Location[facility];  Service: Orthopedics;  Laterality: N/A;  . DECOMPRESSIVE LUMBAR LAMINECTOMY LEVEL 1 N/A 11/20/2014   Procedure: REVISION L4-S1 DECOMPRESSION, L4-L5 IN SITU FUSION (LEVEL 1);  Surgeon: Melina Schools, MD;  Location: [facility];  Service: Orthopedics;  Laterality: N/A;  . KNEE SURGERY Right 2009   meniscus repair  . TOTAL HIP ARTHROPLASTY  2011   RIGHT    Review of Systems  Constitutional: Negative.  Negative for activity change, fatigue and fever.  HENT: Negative.   Eyes: Negative.   Respiratory: Negative.  Negative for cough.   Cardiovascular: Negative.  Negative for chest pain.  Gastrointestinal: Negative.  Negative for  abdominal pain.  Endocrine: Negative.   Genitourinary: Negative.  Negative for dysuria.  Musculoskeletal: Negative.   Skin: Negative.   Neurological: Negative.   Psychiatric/Behavioral: Positive for dysphoric mood. Negative for self-injury, sleep disturbance and suicidal ideas. The patient is nervous/anxious.     Allergies as of 10/20/2016      Reactions   Gabapentin Other (See Comments)   Dizzy   Oxycodone Itching      Medication List       Accurate as of 10/20/16  2:31 PM. Always use your most recent med list.          ALPRAZolam 0.25 MG tablet Commonly known as:  XANAX TAKE ONE TABLET BY MOUTH AT BEDTIME   bumetanide 1 MG tablet Commonly known as:  BUMEX Take 1 tablet (1 mg total) by mouth daily.   buPROPion 300 MG 24 hr  tablet Commonly known as:  WELLBUTRIN XL Take 1 tablet (300 mg total) by mouth daily.   cholecalciferol 1000 units tablet Commonly known as:  VITAMIN D Take 2,000 Units by mouth daily.   escitalopram 20 MG tablet Commonly known as:  LEXAPRO Take 1 tablet (20 mg total) by mouth daily.   levothyroxine 25 MCG tablet Commonly known as:  SYNTHROID, LEVOTHROID Take 1 tablet (25 mcg total) by mouth daily before breakfast.   meclizine 25 MG tablet Commonly known as:  ANTIVERT TAKE ONE TABLET BY MOUTH THREE TIMES DAILY AS NEEDED FOR DIZZINESS   meloxicam 15 MG tablet Commonly known as:  MOBIC Take 1 tablet (15 mg total) by mouth daily.   metoprolol succinate 25 MG 24 hr tablet Commonly known as:  TOPROL-XL TAKE 1 TABLET (25 MG TOTAL) BY MOUTH EVERY MORNING.   risperiDONE 1 MG tablet Commonly known as:  RISPERDAL TAKE 1/2 TABLET BY MOUTH AT BEDTIME.   traZODone 50 MG tablet Commonly known as:  DESYREL Take 1-2 tablets (50-100 mg total) by mouth at bedtime as needed for sleep.          Objective:    BP (!) 115/51   Pulse (!) 56   Temp 98.2 F (36.8 C) (Oral)   Ht 5\' 6"  (1.676 m)   Wt 199 lb 12.8 oz (90.6 kg)   BMI 32.25 kg/m   Allergies  Allergen Reactions  . Gabapentin Other (See Comments)    Dizzy  . Oxycodone Itching    Physical Exam  Constitutional: She is oriented to person, place, and time. She appears well-developed and well-nourished.  HENT:  Head: Normocephalic and atraumatic.  Eyes: Conjunctivae and EOM are normal. Pupils are equal, round, and reactive to light.  Cardiovascular: Normal rate, regular rhythm, normal heart sounds and intact distal pulses.   Pulmonary/Chest: Effort normal and breath sounds normal.  Abdominal: Soft. Bowel sounds are normal.  Neurological: She is alert and oriented to person, place, and time. She has normal reflexes.  Skin: Skin is warm and dry. No rash noted.  Psychiatric: She has a normal mood and affect. Her behavior is  normal. Judgment and thought content normal.  Nursing note and vitals reviewed.       Assessment & Plan:   1. Depression, unspecified depression type - buPROPion (WELLBUTRIN XL) 300 MG 24 hr tablet; Take 1 tablet (300 mg total) by mouth daily.  Dispense: 30 tablet; Refill: 1   Continue all other maintenance medications as listed above.  Follow up plan: Return in about 4 weeks (around 11/17/2016) for recheck.  Educational handout given for anxiety  [employer]  Adah Salvage PA-C [facility] [address]  [place], [place] [address] [phone]   10/20/2016, 2:31 PM

## 2016-10-20 NOTE — Patient Instructions (Signed)
Generalized Anxiety Disorder, Adult Generalized anxiety disorder (GAD) is a mental health disorder. People with this condition constantly worry about everyday events. Unlike normal anxiety, worry related to GAD is not triggered by a specific event. These worries also do not fade or get better with time. GAD interferes with life functions, including relationships, work, and school. GAD can vary from mild to severe. People with severe GAD can have intense waves of anxiety with physical symptoms (panic attacks). What are the causes? The exact cause of GAD is not known. What increases the risk? This condition is more likely to develop in:  Women.  People who have a family history of anxiety disorders.  People who are very shy.  People who experience very stressful life events, such as the death of a loved one.  People who have a very stressful family environment. What are the signs or symptoms? People with GAD often worry excessively about many things in their lives, such as their health and family. They may also be overly concerned about:  Doing well at work.  Being on time.  Natural disasters.  Friendships. Physical symptoms of GAD include:  Fatigue.  Muscle tension or having muscle twitches.  Trembling or feeling shaky.  Being easily startled.  Feeling like your heart is pounding or racing.  Feeling out of breath or like you cannot take a deep breath.  Having trouble falling asleep or staying asleep.  Sweating.  Nausea, diarrhea, or irritable bowel syndrome (IBS).  Headaches.  Trouble concentrating or remembering facts.  Restlessness.  Irritability. How is this diagnosed? Your health care provider can diagnose GAD based on your symptoms and medical history. You will also have a physical exam. The health care provider will ask specific questions about your symptoms, including how severe they are, when they started, and if they come and go. Your health care  provider may ask you about your use of alcohol or drugs, including prescription medicines. Your health care provider may refer you to a mental health specialist for further evaluation. Your health care provider will do a thorough examination and may perform additional tests to rule out other possible causes of your symptoms. To be diagnosed with GAD, a person must have anxiety that:  Is out of his or her control.  Affects several different aspects of his or her life, such as work and relationships.  Causes distress that makes him or her unable to take part in normal activities.  Includes at least three physical symptoms of GAD, such as restlessness, fatigue, trouble concentrating, irritability, muscle tension, or sleep problems. Before your health care provider can confirm a diagnosis of GAD, these symptoms must be present more days than they are not, and they must last for six months or longer. How is this treated? The following therapies are usually used to treat GAD:  Medicine. Antidepressant medicine is usually prescribed for long-term daily control. Antianxiety medicines may be added in severe cases, especially when panic attacks occur.  Talk therapy (psychotherapy). Certain types of talk therapy can be helpful in treating GAD by providing support, education, and guidance. Options include:  Cognitive behavioral therapy (CBT). People learn coping skills and techniques to ease their anxiety. They learn to identify unrealistic or negative thoughts and behaviors and to replace them with positive ones.  Acceptance and commitment therapy (ACT). This treatment teaches people how to be mindful as a way to cope with unwanted thoughts and feelings.  Biofeedback. This process trains you to manage your body's response (  physiological response) through breathing techniques and relaxation methods. You will work with a therapist while machines are used to monitor your physical symptoms.  Stress  management techniques. These include yoga, meditation, and exercise. A mental health specialist can help determine which treatment is best for you. Some people see improvement with one type of therapy. However, other people require a combination of therapies. Follow these instructions at home:  Take over-the-counter and prescription medicines only as told by your health care provider.  Try to maintain a normal routine.  Try to anticipate stressful situations and allow extra time to manage them.  Practice any stress management or self-calming techniques as taught by your health care provider.  Do not punish yourself for setbacks or for not making progress.  Try to recognize your accomplishments, even if they are small.  Keep all follow-up visits as told by your health care provider. This is important. Contact a health care provider if:  Your symptoms do not get better.  Your symptoms get worse.  You have signs of depression, such as:  A persistently sad, cranky, or irritable mood.  Loss of enjoyment in activities that used to bring you joy.  Change in weight or eating.  Changes in sleeping habits.  Avoiding friends or family members.  Loss of energy for normal tasks.  Feelings of guilt or worthlessness. Get help right away if:  You have serious thoughts about hurting yourself or others. If you ever feel like you may hurt yourself or others, or have thoughts about taking your own life, get help right away. You can go to your nearest emergency department or call:  Your local emergency services (911 in the [place]  A suicide crisis helpline, such as the National Suicide Prevention Lifeline at 1-800-273-8255. This is open 24 hours a day. Summary  Generalized anxiety disorder (GAD) is a mental health disorder that involves worry that is not triggered by a specific event.  People with GAD often worry excessively about many things in their lives, such as their health and  family.  GAD may cause physical symptoms such as restlessness, trouble concentrating, sleep problems, frequent sweating, nausea, diarrhea, headaches, and trembling or muscle twitching.  A mental health specialist can help determine which treatment is best for you. Some people see improvement with one type of therapy. However, other people require a combination of therapies. This information is not intended to replace advice given to you by your health care provider. Make sure you discuss any questions you have with your health care provider. Document Released: 10/09/2012 Document Revised: 05/04/2016 Document Reviewed: 05/04/2016 Elsevier Interactive Patient Education  2017 [employer]  

## 2016-10-28 ENCOUNTER — Telehealth: Payer: Self-pay | Admitting: Physician Assistant

## 2016-10-28 NOTE — Telephone Encounter (Signed)
Patient aware and verbalizes understanding. 

## 2016-10-28 NOTE — Telephone Encounter (Signed)
Have her cut it in half for now and see how it does and come see her PCP. If it continues then she needs to come in sooner because infections can cause similar issues.

## 2016-10-28 NOTE — Telephone Encounter (Signed)
Patient states she has been having dizzy spells for the last 6 weeks. Patient states that she went out for a walk yesterday and was having a dizzy spell and fell. States that her thumb is the only thing that hurts from the fall. Thinks it is from her bp- when sitting down her bp has been around 101/57 and when standing it has been around 80/61 and 69/54. States that when she does not take her bp medication she does not have a dizzy spell. Advised patient to come in to be seen and states she only wants to see [facility] and wanted to wait until her first opening which is Monday. Appointment given for Monday at 10:40.  Wanting to stop taking bp medication until appointment Monday- covering provider, please advise.

## 2016-11-01 ENCOUNTER — Encounter: Payer: Self-pay | Admitting: Physician Assistant

## 2016-11-01 ENCOUNTER — Ambulatory Visit (INDEPENDENT_AMBULATORY_CARE_PROVIDER_SITE_OTHER): Payer: Medicare Other | Admitting: Physician Assistant

## 2016-11-01 ENCOUNTER — Ambulatory Visit (INDEPENDENT_AMBULATORY_CARE_PROVIDER_SITE_OTHER): Payer: Medicare Other

## 2016-11-01 VITALS — BP 133/70 | HR 68 | Temp 97.6°F | Ht 66.0 in | Wt 196.6 lb

## 2016-11-01 DIAGNOSIS — W19XXXA Unspecified fall, initial encounter: Secondary | ICD-10-CM

## 2016-11-01 DIAGNOSIS — W01198A Fall on same level from slipping, tripping and stumbling with subsequent striking against other object, initial encounter: Secondary | ICD-10-CM

## 2016-11-01 DIAGNOSIS — I952 Hypotension due to drugs: Secondary | ICD-10-CM

## 2016-11-01 DIAGNOSIS — M79641 Pain in right hand: Secondary | ICD-10-CM

## 2016-11-01 DIAGNOSIS — S62521A Displaced fracture of distal phalanx of right thumb, initial encounter for closed fracture: Secondary | ICD-10-CM | POA: Diagnosis not present

## 2016-11-01 NOTE — Patient Instructions (Signed)
Finger Fracture A finger fracture is a break in any of the bones of the fingers. What are the causes? The main cause of finger fractures is traumatic injury, such as from:  An injury while playing sports.  A workplace injury.  A fall.  What increases the risk? Activities that can increase your risk of finger fractures include:  Sports.  Workplace activities that involve machinery.  A condition called osteoporosis, which can make your bones less dense and cause them to fracture more easily.  What are the signs or symptoms? The main symptoms of a broken finger are pain, bruising, and swelling shortly after the injury. Other symptoms include:  Bruising of your finger.  Stiffness of your finger.  Exposed bones (compound or open fracture) if the fracture is severe.  How is this diagnosed? This condition is diagnosed based on a physical exam, your medical history, and your symptoms. An X-ray will also be done. How is this treated? Treatment for this condition depends on the severity of the fracture. If the bones are still in place, the finger may be splinted to keep the finger still while it heals (immobilization). If the bones are out of place, they will need to be put back into place. This may be done without surgery, or surgery may be needed. You may also be given exercises to do to regain strength and flexibility (physical therapy). Follow these instructions at home: If you have a splint:  Wear the splint as told by your health care provider. Remove it only as told by your health care provider.  Loosen the splint if your fingers tingle, become numb, or turn cold and blue.  Keep the splint clean.  If the splint is not waterproof: ? Do not let it get wet. ? Cover it with a watertight covering when you take a bath or a shower. Managing pain, stiffness, and swelling  If directed, put ice on the injured area: ? If you have a removable splint, remove it as told by your health  care provider. ? Put ice in a plastic bag. ? Place a towel between your skin and the bag. ? Leave the ice on for 20 minutes, 2-3 times a day.  Move your fingers often to avoid stiffness and to lessen swelling.  Raise (elevate) the injured area above the level of your heart while you are sitting or lying down. Driving  Do not drive or use heavy machinery while taking prescription pain medicine.  Ask your health care provider when it is safe to drive if you have a splint. General instructions  Do not put pressure on any part of the splint until it is fully hardened. This may take several hours.  Do not use any products that contain nicotine or tobacco, such as cigarettes and e-cigarettes. These can delay bone healing. If you need help quitting, ask your health care provider.  Take over-the-counter and prescription medicines only as told by your health care provider.  Do exercises as told by your health care provider.  Keep all follow-up visits as told by your health care provider. This is important. Contact a health care provider if:  Your pain or swelling gets worse even with treatment.  You have trouble moving your finger. Get help right away if:  Your finger becomes numb or blue. Summary  A finger fracture is a break in any of the bones of the fingers.  Traumatic injury is the main cause of finger fractures.  Treatment for this condition   depends on the severity of the fracture. This information is not intended to replace advice given to you by your health care provider. Make sure you discuss any questions you have with your health care provider. Document Released: 09/26/2000 Document Revised: 05/04/2016 Document Reviewed: 05/04/2016 Elsevier Interactive Patient Education  2017 Elsevier Inc.  

## 2016-11-03 DIAGNOSIS — S62521A Displaced fracture of distal phalanx of right thumb, initial encounter for closed fracture: Secondary | ICD-10-CM | POA: Insufficient documentation

## 2016-11-03 DIAGNOSIS — I952 Hypotension due to drugs: Secondary | ICD-10-CM | POA: Insufficient documentation

## 2016-11-03 NOTE — Progress Notes (Signed)
BP 133/70   Pulse 68   Temp 97.6 F (36.4 C) (Oral)   Ht 5\' 6"  (1.676 m)   Wt 196 lb 9.6 oz (89.2 kg)   BMI 31.73 kg/m    Subjective:    Patient ID: Brenda Solis, female    DOB: 20-Sep-1934, 82 y.o.   MRN: [id number]  HPI: Brenda Solis is a 82 y.o. female presenting on 11/01/2016 for Fall (Patient fell on Friday and hurt right thumb)  This patient comes in for periodic recheck on medications and conditions including episodes of low BP. Since having these over the past couple of weeks the patient has discontinued her metoprolol. He states she is feeling quite good. Her blood pressure is improved. During this time as when she had some weakness and fell. She thought she was feeling better. She went out for a walk in her yard. She was using her cane. Her right hand was pulled on the cane when she fell. She has had pain in the left thumb ever since. She has tried to use ice and rest it. It is still quite bruised and tender..   All medications are reviewed today. There are no reports of any problems with the medications. All of the medical conditions are reviewed and updated.  Lab work is reviewed and will be ordered as medically necessary. There are no new problems reported with today's visit.   Relevant past medical, surgical, family and social history reviewed and updated as indicated. Allergies and medications reviewed and updated.  Past Medical History:  Diagnosis Date  . Depression   . Heart murmur   . Hyperlipidemia   . Hypertension   . Hypothyroidism   . Insomnia, unspecified   . Spondylosis of unspecified site without mention of myelopathy   . Thyroid disease    hypothyroidism    Past Surgical History:  Procedure Laterality Date  . APPENDECTOMY    . CATARACT EXTRACTION, BILATERAL Bilateral 2006  . [place]  . [place]  . CHOLECYSTECTOMY  1990  . DECOMPRESSIVE LUMBAR LAMINECTOMY LEVEL 1 N/A 02/01/2013   Procedure: CENTRAL  DECOMPRESSIVE LUMBAR LAMINECTOMY LEVEL 1 L4-L5;  Surgeon: Tobi Bastos, MD;  Location: [facility];  Service: Orthopedics;  Laterality: N/A;  . DECOMPRESSIVE LUMBAR LAMINECTOMY LEVEL 1 N/A 11/20/2014   Procedure: REVISION L4-S1 DECOMPRESSION, L4-L5 IN SITU FUSION (LEVEL 1);  Surgeon: Melina Schools, MD;  Location: [place];  Service: Orthopedics;  Laterality: N/A;  . KNEE SURGERY Right 2009   meniscus repair  . TOTAL HIP ARTHROPLASTY  2011   RIGHT    Review of Systems  Constitutional: Negative.  Negative for activity change, fatigue and fever.  HENT: Negative.   Eyes: Negative.   Respiratory: Negative.  Negative for cough.   Cardiovascular: Negative.  Negative for chest pain.  Gastrointestinal: Negative.  Negative for abdominal pain.  Endocrine: Negative.   Genitourinary: Negative.  Negative for dysuria.  Musculoskeletal: Positive for arthralgias and joint swelling.  Skin: Positive for color change.  Neurological: Negative.     Allergies as of 11/01/2016      Reactions   Gabapentin Other (See Comments)   Dizzy   Oxycodone Itching      Medication List       Accurate as of 11/01/16 11:59 PM. Always use your most recent med list.          ALPRAZolam 0.25 MG tablet Commonly known as:  XANAX TAKE ONE TABLET  BY MOUTH AT BEDTIME   bumetanide 1 MG tablet Commonly known as:  BUMEX Take 1 tablet (1 mg total) by mouth daily.   buPROPion 300 MG 24 hr tablet Commonly known as:  WELLBUTRIN XL Take 1 tablet (300 mg total) by mouth daily.   cholecalciferol 1000 units tablet Commonly known as:  VITAMIN D Take 2,000 Units by mouth daily.   escitalopram 20 MG tablet Commonly known as:  LEXAPRO Take 1 tablet (20 mg total) by mouth daily.   levothyroxine 25 MCG tablet Commonly known as:  SYNTHROID, LEVOTHROID Take 1 tablet (25 mcg total) by mouth daily before breakfast.   meclizine 25 MG tablet Commonly known as:  ANTIVERT TAKE ONE TABLET BY MOUTH THREE TIMES DAILY AS NEEDED FOR  DIZZINESS   meloxicam 15 MG tablet Commonly known as:  MOBIC Take 1 tablet (15 mg total) by mouth daily.   metoprolol succinate 25 MG 24 hr tablet Commonly known as:  TOPROL-XL TAKE 1 TABLET (25 MG TOTAL) BY MOUTH EVERY MORNING.   risperiDONE 1 MG tablet Commonly known as:  RISPERDAL TAKE 1/2 TABLET BY MOUTH AT BEDTIME.   traZODone 50 MG tablet Commonly known as:  DESYREL Take 1-2 tablets (50-100 mg total) by mouth at bedtime as needed for sleep.          Objective:    BP 133/70   Pulse 68   Temp 97.6 F (36.4 C) (Oral)   Ht 5\' 6"  (1.676 m)   Wt 196 lb 9.6 oz (89.2 kg)   BMI 31.73 kg/m   Allergies  Allergen Reactions  . Gabapentin Other (See Comments)    Dizzy  . Oxycodone Itching    Physical Exam  Constitutional: She is oriented to person, place, and time. She appears well-developed and well-nourished.  HENT:  Head: Normocephalic and atraumatic.  Eyes: Conjunctivae and EOM are normal. Pupils are equal, round, and reactive to light.  Cardiovascular: Normal rate, regular rhythm, normal heart sounds and intact distal pulses.   Pulmonary/Chest: Effort normal and breath sounds normal.  Abdominal: Soft. Bowel sounds are normal.  Musculoskeletal:       Right hand: She exhibits decreased range of motion, tenderness and swelling. She exhibits no deformity.       Hands: Neurological: She is alert and oriented to person, place, and time. She has normal reflexes.  Skin: Skin is warm and dry. No rash noted.  Psychiatric: She has a normal mood and affect. Her behavior is normal. Judgment and thought content normal.        Assessment & Plan:   1. Fall, initial encounter - DG Hand Complete Right; Future Splint for 2 weeks Recheck soon  2. Closed fracture of base of distal phalanx of right thumb  3. Hypotension due to drugs   Current Outpatient Prescriptions:  .  ALPRAZolam (XANAX) 0.25 MG tablet, TAKE ONE TABLET BY MOUTH AT BEDTIME, Disp: 30 tablet, Rfl: 1 .   bumetanide (BUMEX) 1 MG tablet, Take 1 tablet (1 mg total) by mouth daily., Disp: 30 tablet, Rfl: 2 .  buPROPion (WELLBUTRIN XL) 300 MG 24 hr tablet, Take 1 tablet (300 mg total) by mouth daily., Disp: 30 tablet, Rfl: 1 .  cholecalciferol (VITAMIN D) 1000 UNITS tablet, Take 2,000 Units by mouth daily.  , Disp: , Rfl:  .  escitalopram (LEXAPRO) 20 MG tablet, Take 1 tablet (20 mg total) by mouth daily., Disp: 90 tablet, Rfl: 1 .  levothyroxine (SYNTHROID, LEVOTHROID) 25 MCG tablet, Take 1 tablet (  25 mcg total) by mouth daily before breakfast., Disp: 90 tablet, Rfl: 1 .  meclizine (ANTIVERT) 25 MG tablet, TAKE ONE TABLET BY MOUTH THREE TIMES DAILY AS NEEDED FOR DIZZINESS, Disp: 30 tablet, Rfl: 2 .  meloxicam (MOBIC) 15 MG tablet, Take 1 tablet (15 mg total) by mouth daily., Disp: 90 tablet, Rfl: 0 .  metoprolol succinate (TOPROL-XL) 25 MG 24 hr tablet, TAKE 1 TABLET (25 MG TOTAL) BY MOUTH EVERY MORNING., Disp: 90 tablet, Rfl: 0 .  risperiDONE (RISPERDAL) 1 MG tablet, TAKE 1/2 TABLET BY MOUTH AT BEDTIME., Disp: 90 tablet, Rfl: 0 .  traZODone (DESYREL) 50 MG tablet, Take 1-2 tablets (50-100 mg total) by mouth at bedtime as needed for sleep., Disp: 180 tablet, Rfl: 0 No current facility-administered medications for this visit.   Facility-Administered Medications Ordered in Other Visits:  .  DOBUTamine (DOBUTREX) 1,000 mcg/mL in sodium chloride 0.9 % 150 mL infusion, 30 mcg/kg, Intravenous, Continuous, Crenshaw, Denice Bors, MD, 30 mcg/kg/min at 12/02/10 0815  Continue all other maintenance medications as listed above.  Follow up plan: Return for keep follow up with me.  Educational handout given for fracture finger  Terald Sleeper PA-C [facility] [address]  [place], [place] [address] [phone]   11/03/2016, 10:29 AM

## 2016-11-07 ENCOUNTER — Other Ambulatory Visit: Payer: Self-pay | Admitting: Nurse Practitioner

## 2016-11-07 ENCOUNTER — Other Ambulatory Visit: Payer: Self-pay | Admitting: Pediatrics

## 2016-11-15 ENCOUNTER — Telehealth: Payer: Self-pay | Admitting: Physician Assistant

## 2016-11-16 NOTE — Telephone Encounter (Signed)
Patient aware and appointment scheduled for 2:30pm 11/17/2016.

## 2016-11-16 NOTE — Telephone Encounter (Signed)
See note

## 2016-11-16 NOTE — Telephone Encounter (Signed)
Can she come by today or tomorrow and have triage do orthostatics?

## 2016-11-17 ENCOUNTER — Telehealth: Payer: Self-pay | Admitting: Physician Assistant

## 2016-11-17 ENCOUNTER — Ambulatory Visit (INDEPENDENT_AMBULATORY_CARE_PROVIDER_SITE_OTHER): Payer: Medicare Other

## 2016-11-17 DIAGNOSIS — R42 Dizziness and giddiness: Secondary | ICD-10-CM

## 2016-11-17 NOTE — Progress Notes (Signed)
Patient in today for BP check.  Patient states that she has had dizziness with movement for one month.  Orthostatics performed. BPs reviewed by Particia Nearing and she spoke with patient

## 2016-11-23 ENCOUNTER — Ambulatory Visit: Payer: Medicare Other | Admitting: Nurse Practitioner

## 2016-11-23 ENCOUNTER — Ambulatory Visit (INDEPENDENT_AMBULATORY_CARE_PROVIDER_SITE_OTHER): Payer: Medicare Other | Admitting: Physician Assistant

## 2016-11-23 ENCOUNTER — Encounter: Payer: Self-pay | Admitting: Physician Assistant

## 2016-11-23 VITALS — BP 120/57 | HR 73 | Temp 97.5°F | Ht 66.0 in | Wt 194.6 lb

## 2016-11-23 DIAGNOSIS — F3341 Major depressive disorder, recurrent, in partial remission: Secondary | ICD-10-CM

## 2016-11-23 DIAGNOSIS — I951 Orthostatic hypotension: Secondary | ICD-10-CM | POA: Diagnosis not present

## 2016-11-23 NOTE — Progress Notes (Signed)
BP (!) 120/57   Pulse 73   Temp 97.5 F (36.4 C) (Oral)   Ht 5\' 6"  (1.676 m)   Wt 194 lb 9.6 oz (88.3 kg)   BMI 31.41 kg/m    Subjective:    Patient ID: Brenda Solis, female    DOB: [birth year], 82 y.o.   MRN: [id number]  HPI: Brenda Solis is a 82 y.o. female presenting on 11/23/2016 for Follow-up (1 month on depression and Blood pressure)  This patient comes in for periodic recheck on medications and conditions including Hypertension and recent hypotension and depression. We have stopped all of her antihypertensives about 1 week ago. She is feeling much better. She has had 3 very brief dizziness episodes all upon arising. She has greatly increased her fluid intake. She states she is feeling much better.  As far as her depression she states she is feeling quite good these days. She has been taking Wellbutrin for almost 2 months now. She would like to stop the risperidone. This was placed on her couple years ago when she had a lot of depression going on. She had been taking it with the escitalopram for some time. We will work on tapering it off over the next 2 weeks. She will go down to half a tablet daily for 7 days and then half a tablet every other day for 7 days. I've instructed her that if she is feeling poorly after 2 weeks to restart the medication.  All medications are reviewed today. There are no reports of any problems with the medications. All of the medical conditions are reviewed and updated.  Lab work is reviewed and will be ordered as medically necessary. There are no new problems reported with today's visit.   Relevant past medical, surgical, family and social history reviewed and updated as indicated. Allergies and medications reviewed and updated.  Past Medical History:  Diagnosis Date  . Depression   . Heart murmur   . Hyperlipidemia   . Hypertension   . Hypothyroidism   . Insomnia, unspecified   . Spondylosis of unspecified site without mention of myelopathy   .  Thyroid disease    hypothyroidism    Past Surgical History:  Procedure Laterality Date  . APPENDECTOMY    . CATARACT EXTRACTION, BILATERAL Bilateral 2006  . [place]  . [place]  . CHOLECYSTECTOMY  1990  . DECOMPRESSIVE LUMBAR LAMINECTOMY LEVEL 1 N/A 02/01/2013   Procedure: CENTRAL DECOMPRESSIVE LUMBAR LAMINECTOMY LEVEL 1 L4-L5;  Surgeon: Tobi Bastos, MD;  Location: [facility];  Service: Orthopedics;  Laterality: N/A;  . DECOMPRESSIVE LUMBAR LAMINECTOMY LEVEL 1 N/A 11/20/2014   Procedure: REVISION L4-S1 DECOMPRESSION, L4-L5 IN SITU FUSION (LEVEL 1);  Surgeon: Melina Schools, MD;  Location: [place];  Service: Orthopedics;  Laterality: N/A;  . KNEE SURGERY Right 2009   meniscus repair  . TOTAL HIP ARTHROPLASTY  2011   RIGHT    Review of Systems  Constitutional: Negative.  Negative for activity change, fatigue and fever.  HENT: Negative.   Eyes: Negative.   Respiratory: Negative.  Negative for cough.   Cardiovascular: Negative.  Negative for chest pain.  Gastrointestinal: Negative.  Negative for abdominal pain.  Endocrine: Negative.   Genitourinary: Negative.  Negative for dysuria.  Musculoskeletal: Negative.   Skin: Negative.   Neurological: Negative.   Psychiatric/Behavioral: Positive for dysphoric mood. Negative for sleep disturbance and suicidal ideas. The patient is not nervous/anxious.  Allergies as of 11/23/2016      Reactions   Gabapentin Other (See Comments)   Dizzy   Oxycodone Itching      Medication List       Accurate as of 11/23/16  4:24 PM. Always use your most recent med list.          ALPRAZolam 0.25 MG tablet Commonly known as:  XANAX TAKE ONE TABLET BY MOUTH AT BEDTIME   bumetanide 1 MG tablet Commonly known as:  BUMEX Take 1 tablet (1 mg total) by mouth daily.   buPROPion 300 MG 24 hr tablet Commonly known as:  WELLBUTRIN XL Take 1 tablet (300 mg total) by mouth daily.   cholecalciferol 1000 units  tablet Commonly known as:  VITAMIN D Take 2,000 Units by mouth daily.   escitalopram 20 MG tablet Commonly known as:  LEXAPRO Take 1 tablet (20 mg total) by mouth daily.   levothyroxine 25 MCG tablet Commonly known as:  SYNTHROID, LEVOTHROID Take 1 tablet (25 mcg total) by mouth daily before breakfast.   meclizine 25 MG tablet Commonly known as:  ANTIVERT TAKE ONE TABLET BY MOUTH THREE TIMES DAILY AS NEEDED FOR DIZZINESS   meloxicam 15 MG tablet Commonly known as:  MOBIC TAKE 1 TABLET (15 MG TOTAL) BY MOUTH DAILY.   metoprolol succinate 25 MG 24 hr tablet Commonly known as:  TOPROL-XL TAKE 1 TABLET (25 MG TOTAL) BY MOUTH EVERY MORNING.   risperiDONE 1 MG tablet Commonly known as:  RISPERDAL TAKE 1 TABLET (1 MG TOTAL) BY MOUTH AT BEDTIME.   traZODone 50 MG tablet Commonly known as:  DESYREL Take 1-2 tablets (50-100 mg total) by mouth at bedtime as needed for sleep.          Objective:    BP (!) 120/57   Pulse 73   Temp 97.5 F (36.4 C) (Oral)   Ht 5\' 6"  (1.676 m)   Wt 194 lb 9.6 oz (88.3 kg)   BMI 31.41 kg/m   Allergies  Allergen Reactions  . Gabapentin Other (See Comments)    Dizzy  . Oxycodone Itching    Physical Exam  Constitutional: She is oriented to person, place, and time. She appears well-developed and well-nourished.  HENT:  Head: Normocephalic and atraumatic.  Right Ear: Tympanic membrane, external ear and ear canal normal.  Left Ear: Tympanic membrane, external ear and ear canal normal.  Nose: Nose normal. No rhinorrhea.  Mouth/Throat: Oropharynx is clear and moist and mucous membranes are normal. No oropharyngeal exudate or posterior oropharyngeal erythema.  Eyes: Conjunctivae and EOM are normal. Pupils are equal, round, and reactive to light.  Neck: Normal range of motion. Neck supple.  Cardiovascular: Normal rate, regular rhythm, normal heart sounds and intact distal pulses.   Pulmonary/Chest: Effort normal and breath sounds normal.    Abdominal: Soft. Bowel sounds are normal.  Neurological: She is alert and oriented to person, place, and time. She has normal reflexes.  Skin: Skin is warm and dry. No rash noted.  Psychiatric: She has a normal mood and affect. Her behavior is normal. Judgment and thought content normal.  Nursing note and vitals reviewed.       Assessment & Plan:   1. Orthostatic hypotension  2. Recurrent major depressive disorder, in partial remission [facility])    Current Outpatient Prescriptions:  .  ALPRAZolam (XANAX) 0.25 MG tablet, TAKE ONE TABLET BY MOUTH AT BEDTIME, Disp: 30 tablet, Rfl: 1 .  bumetanide (BUMEX) 1 MG tablet, Take 1 tablet (  1 mg total) by mouth daily., Disp: 30 tablet, Rfl: 2 .  buPROPion (WELLBUTRIN XL) 300 MG 24 hr tablet, Take 1 tablet (300 mg total) by mouth daily., Disp: 30 tablet, Rfl: 1 .  cholecalciferol (VITAMIN D) 1000 UNITS tablet, Take 2,000 Units by mouth daily.  , Disp: , Rfl:  .  escitalopram (LEXAPRO) 20 MG tablet, Take 1 tablet (20 mg total) by mouth daily., Disp: 90 tablet, Rfl: 1 .  levothyroxine (SYNTHROID, LEVOTHROID) 25 MCG tablet, Take 1 tablet (25 mcg total) by mouth daily before breakfast., Disp: 90 tablet, Rfl: 1 .  meclizine (ANTIVERT) 25 MG tablet, TAKE ONE TABLET BY MOUTH THREE TIMES DAILY AS NEEDED FOR DIZZINESS, Disp: 30 tablet, Rfl: 2 .  meloxicam (MOBIC) 15 MG tablet, TAKE 1 TABLET (15 MG TOTAL) BY MOUTH DAILY., Disp: 90 tablet, Rfl: 1 .  metoprolol succinate (TOPROL-XL) 25 MG 24 hr tablet, TAKE 1 TABLET (25 MG TOTAL) BY MOUTH EVERY MORNING., Disp: 90 tablet, Rfl: 0 .  risperiDONE (RISPERDAL) 1 MG tablet, TAKE 1 TABLET (1 MG TOTAL) BY MOUTH AT BEDTIME., Disp: 90 tablet, Rfl: 0 .  traZODone (DESYREL) 50 MG tablet, Take 1-2 tablets (50-100 mg total) by mouth at bedtime as needed for sleep., Disp: 180 tablet, Rfl: 0  Continue all other maintenance medications as listed above.  Follow up plan: Return in about 3 months (around 02/23/2017) for  recheck.  Educational handout given for hypotension  Terald Sleeper PA-C [place] [address]  [place], [place] [address] [phone]   11/23/2016, 4:24 PM

## 2016-11-23 NOTE — Patient Instructions (Signed)
Hypotension As your heart beats, it forces blood through your body. This force is called blood pressure. If you have hypotension, you have low blood pressure. When your blood pressure is too low, you may not get enough blood to your brain. You may feel weak, feel light-headed, have a fast heartbeat, or even pass out (faint). Follow these instructions at home: Eating and drinking  Drink enough fluids to keep your pee (urine) clear or pale yellow.  Eat a healthy diet, and follow instructions from your doctor about eating or drinking restrictions. A healthy diet includes: ? Fresh fruits and vegetables. ? Whole grains. ? Low-fat (lean) meats. ? Low-fat dairy products.  Eat extra salt only as told. Do not add extra salt to your diet unless your doctor tells you to.  Eat small meals often.  Avoid standing up quickly after you eat. Medicines  Take over-the-counter and prescription medicines only as told by your doctor. ? Follow instructions from your doctor about changing how much you take (the dosage) of your medicines, if this applies. ? Do not stop or change your medicine on your own. General instructions  Wear compression stockings as told by your doctor.  Get up slowly from lying down or sitting.  Avoid hot showers and a lot of heat as told by your doctor.  Return to your normal activities as told by your doctor. Ask what activities are safe for you.  Do not use any products that contain nicotine or tobacco, such as cigarettes and e-cigarettes. If you need help quitting, ask your doctor.  Keep all follow-up visits as told by your doctor. This is important. Contact a doctor if:  You throw up (vomit).  You have watery poop (diarrhea).  You have a fever for more than 2-3 days.  You feel more thirsty than normal.  You feel weak and tired. Get help right away if:  You have chest pain.  You have a fast or irregular heartbeat.  You lose feeling (get numbness) in any part  of your body.  You cannot move your arms or your legs.  You have trouble talking.  You get sweaty or feel light-headed.  You faint.  You have trouble breathing.  You have trouble staying awake.  You feel confused. This information is not intended to replace advice given to you by your health care provider. Make sure you discuss any questions you have with your health care provider. Document Released: 09/08/2009 Document Revised: 03/02/2016 Document Reviewed: 03/02/2016 Elsevier Interactive Patient Education  2017 Elsevier Inc.   

## 2016-11-25 ENCOUNTER — Other Ambulatory Visit: Payer: Self-pay | Admitting: Nurse Practitioner

## 2016-12-19 ENCOUNTER — Other Ambulatory Visit: Payer: Self-pay | Admitting: Physician Assistant

## 2016-12-19 DIAGNOSIS — F32A Depression, unspecified: Secondary | ICD-10-CM

## 2016-12-19 DIAGNOSIS — F329 Major depressive disorder, single episode, unspecified: Secondary | ICD-10-CM

## 2017-01-09 ENCOUNTER — Other Ambulatory Visit: Payer: Self-pay | Admitting: Physician Assistant

## 2017-02-04 ENCOUNTER — Other Ambulatory Visit: Payer: Self-pay | Admitting: Physician Assistant

## 2017-02-04 DIAGNOSIS — F329 Major depressive disorder, single episode, unspecified: Secondary | ICD-10-CM

## 2017-02-04 DIAGNOSIS — F32A Depression, unspecified: Secondary | ICD-10-CM

## 2017-02-23 ENCOUNTER — Ambulatory Visit (INDEPENDENT_AMBULATORY_CARE_PROVIDER_SITE_OTHER): Payer: Medicare Other | Admitting: Physician Assistant

## 2017-02-23 ENCOUNTER — Encounter: Payer: Self-pay | Admitting: Physician Assistant

## 2017-02-23 VITALS — BP 119/59 | HR 70 | Temp 98.7°F | Wt 195.2 lb

## 2017-02-23 DIAGNOSIS — E039 Hypothyroidism, unspecified: Secondary | ICD-10-CM

## 2017-02-23 DIAGNOSIS — M479 Spondylosis, unspecified: Secondary | ICD-10-CM | POA: Diagnosis not present

## 2017-02-23 DIAGNOSIS — F39 Unspecified mood [affective] disorder: Secondary | ICD-10-CM | POA: Diagnosis not present

## 2017-02-23 DIAGNOSIS — I951 Orthostatic hypotension: Secondary | ICD-10-CM | POA: Diagnosis not present

## 2017-02-23 MED ORDER — METOPROLOL SUCCINATE ER 25 MG PO TB24: 12.5000 mg | ORAL_TABLET | Freq: Every day | ORAL | 0 refills | Status: DC

## 2017-02-23 NOTE — Patient Instructions (Signed)
In a few days you may receive a survey in the mail or online from [employer] regarding your visit with us today. Please take a moment to fill this out. Your feedback is very important to our whole office. It can help us better understand your needs as well as improve your experience and satisfaction. Thank you for taking your time to complete it. We care about you.  Sahir Tolson, PA-C  

## 2017-02-23 NOTE — Progress Notes (Signed)
BP (!) 119/59   Pulse 70   Temp 98.7 F (37.1 C) (Oral)   Wt 195 lb 3.2 oz (88.5 kg)   BMI 31.51 kg/m    Subjective:    Patient ID: Brenda Solis, female    DOB: [birth year], 82 y.o.   MRN: [id number]  HPI: Brenda Solis is a 82 y.o. female presenting on 02/23/2017 for Follow-up (3 month )  This patient comes in for periodic recheck on medications and conditions including hypothyroid, mood disorder, orthostatic hypotension, OA.  She is feeling very good with only slight orthostasis upon standing.  Her BP is still quite good and a little on the low side.  Concerns for low pressure.  Her mood and depression are goosd. She has become more active, walking daily at [facility], and feeling good around her family. Depression screen [facility] 2/9 02/23/2017 11/01/2016 10/20/2016 09/22/2016 04/24/2016  Decreased Interest 0 2 2 3  0  Down, Depressed, Hopeless 0 2 2 3  0  PHQ - 2 Score 0 4 4 6  0  Altered sleeping - 3 3 2  -  Tired, decreased energy - 3 3 2  -  Change in appetite - 0 0 0 -  Feeling bad or failure about yourself  - 2 2 1  -  Trouble concentrating - 0 0 0 -  Moving slowly or fidgety/restless - 2 2 2  -  Suicidal thoughts - 0 0 0 -  PHQ-9 Score - 14 14 13  -  Difficult doing work/chores - - - - -     All medications are reviewed today. There are no reports of any problems with the medications. All of the medical conditions are reviewed and updated.  Lab work is reviewed and will be ordered as medically necessary. There are no new problems reported with today's visit.   Relevant past medical, surgical, family and social history reviewed and updated as indicated. Allergies and medications reviewed and updated.  Past Medical History:  Diagnosis Date  . Depression   . Heart murmur   . Hyperlipidemia   . Hypertension   . Hypothyroidism   . Insomnia, unspecified   . Spondylosis of unspecified site without mention of myelopathy   . Thyroid disease    hypothyroidism    Past Surgical History:    Procedure Laterality Date  . APPENDECTOMY    . CATARACT EXTRACTION, BILATERAL Bilateral 2006  . [place]  . [place]  . CHOLECYSTECTOMY  1990  . DECOMPRESSIVE LUMBAR LAMINECTOMY LEVEL 1 N/A 02/01/2013   Procedure: CENTRAL DECOMPRESSIVE LUMBAR LAMINECTOMY LEVEL 1 L4-L5;  Surgeon: Tobi Bastos, MD;  Location: [facility];  Service: Orthopedics;  Laterality: N/A;  . DECOMPRESSIVE LUMBAR LAMINECTOMY LEVEL 1 N/A 11/20/2014   Procedure: REVISION L4-S1 DECOMPRESSION, L4-L5 IN SITU FUSION (LEVEL 1);  Surgeon: Melina Schools, MD;  Location: [place];  Service: Orthopedics;  Laterality: N/A;  . KNEE SURGERY Right 2009   meniscus repair  . TOTAL HIP ARTHROPLASTY  2011   RIGHT    Review of Systems  Constitutional: Negative.  Negative for activity change, fatigue and fever.  HENT: Negative.   Eyes: Negative.   Respiratory: Negative.  Negative for cough.   Cardiovascular: Negative.  Negative for chest pain.  Gastrointestinal: Negative.  Negative for abdominal pain.  Endocrine: Negative.   Genitourinary: Negative.  Negative for dysuria.  Musculoskeletal: Negative.   Skin: Negative.   Neurological: Negative.     Allergies as of 02/23/2017  Reactions   Gabapentin Other (See Comments)   Dizzy   Oxycodone Itching      Medication List       Accurate as of 02/23/17 11:59 PM. Always use your most recent med list.          ALPRAZolam 0.25 MG tablet Commonly known as:  XANAX TAKE ONE TABLET BY MOUTH AT BEDTIME   buPROPion 300 MG 24 hr tablet Commonly known as:  WELLBUTRIN XL TAKE 1 TABLET (300 MG TOTAL) BY MOUTH DAILY.   cholecalciferol 1000 units tablet Commonly known as:  VITAMIN D Take 2,000 Units by mouth daily.   escitalopram 20 MG tablet Commonly known as:  LEXAPRO Take 1 tablet (20 mg total) by mouth daily.   levothyroxine 25 MCG tablet Commonly known as:  SYNTHROID, LEVOTHROID Take 1 tablet (25 mcg total) by mouth daily before  breakfast.   meclizine 25 MG tablet Commonly known as:  ANTIVERT TAKE ONE TABLET BY MOUTH THREE TIMES DAILY AS NEEDED FOR DIZZINESS   meloxicam 15 MG tablet Commonly known as:  MOBIC TAKE 1 TABLET (15 MG TOTAL) BY MOUTH DAILY.   metoprolol succinate 25 MG 24 hr tablet Commonly known as:  TOPROL-XL Take 0.5 tablets (12.5 mg total) by mouth daily.   risperiDONE 1 MG tablet Commonly known as:  RISPERDAL TAKE 1 TABLET (1 MG TOTAL) BY MOUTH AT BEDTIME.   traZODone 50 MG tablet Commonly known as:  DESYREL TAKE 1-2 TABLETS (50-100 MG TOTAL) BY MOUTH AT BEDTIME AS NEEDED FOR SLEEP.            Discharge Care Instructions        Start     Ordered   02/23/17 0000  Thyroid Panel With TSH     02/23/17 1424   02/23/17 0000  metoprolol succinate (TOPROL-XL) 25 MG 24 hr tablet  Daily    Question:  Supervising Provider  Answer:  Timmothy Euler   02/23/17 1424         Objective:    BP (!) 119/59   Pulse 70   Temp 98.7 F (37.1 C) (Oral)   Wt 195 lb 3.2 oz (88.5 kg)   BMI 31.51 kg/m   Allergies  Allergen Reactions  . Gabapentin Other (See Comments)    Dizzy  . Oxycodone Itching    Physical Exam  Constitutional: She is oriented to person, place, and time. She appears well-developed and well-nourished.  HENT:  Head: Normocephalic and atraumatic.  Eyes: Pupils are equal, round, and reactive to light. Conjunctivae and EOM are normal.  Cardiovascular: Normal rate, regular rhythm, normal heart sounds and intact distal pulses.   Pulmonary/Chest: Effort normal and breath sounds normal.  Abdominal: Soft. Bowel sounds are normal.  Neurological: She is alert and oriented to person, place, and time. She has normal reflexes.  Skin: Skin is warm and dry. No rash noted.  Psychiatric: She has a normal mood and affect. Her behavior is normal. Judgment and thought content normal.  Nursing note and vitals reviewed.   Results for orders placed or performed in visit on 02/23/17    Thyroid Panel With TSH  Result Value Ref Range   TSH 5.400 (H) 0.450 - 4.500 uIU/mL   T4, Total 7.2 4.5 - 12.0 ug/dL   T3 Uptake Ratio 25 24 - 39 %   Free Thyroxine Index 1.8 1.2 - 4.9      Assessment & Plan:   1. Hypothyroidism, unspecified type - Thyroid Panel With TSH  2.  Mood disorder ([place])  3. Orthostatic hypotension - metoprolol succinate (TOPROL-XL) 25 MG 24 hr tablet; Take 0.5 tablets (12.5 mg total) by mouth daily.  Dispense: 90 tablet; Refill: 0  4. Osteoarthritis of spine, unspecified spinal osteoarthritis complication status, unspecified spinal region    Current Outpatient Prescriptions:  .  ALPRAZolam (XANAX) 0.25 MG tablet, TAKE ONE TABLET BY MOUTH AT BEDTIME, Disp: 30 tablet, Rfl: 1 .  buPROPion (WELLBUTRIN XL) 300 MG 24 hr tablet, TAKE 1 TABLET (300 MG TOTAL) BY MOUTH DAILY., Disp: 30 tablet, Rfl: 0 .  cholecalciferol (VITAMIN D) 1000 UNITS tablet, Take 2,000 Units by mouth daily.  , Disp: , Rfl:  .  escitalopram (LEXAPRO) 20 MG tablet, Take 1 tablet (20 mg total) by mouth daily., Disp: 90 tablet, Rfl: 1 .  levothyroxine (SYNTHROID, LEVOTHROID) 25 MCG tablet, Take 1 tablet (25 mcg total) by mouth daily before breakfast., Disp: 90 tablet, Rfl: 1 .  meclizine (ANTIVERT) 25 MG tablet, TAKE ONE TABLET BY MOUTH THREE TIMES DAILY AS NEEDED FOR DIZZINESS, Disp: 30 tablet, Rfl: 2 .  meloxicam (MOBIC) 15 MG tablet, TAKE 1 TABLET (15 MG TOTAL) BY MOUTH DAILY., Disp: 90 tablet, Rfl: 0 .  metoprolol succinate (TOPROL-XL) 25 MG 24 hr tablet, Take 0.5 tablets (12.5 mg total) by mouth daily., Disp: 90 tablet, Rfl: 0 .  risperiDONE (RISPERDAL) 1 MG tablet, TAKE 1 TABLET (1 MG TOTAL) BY MOUTH AT BEDTIME., Disp: 90 tablet, Rfl: 0 .  traZODone (DESYREL) 50 MG tablet, TAKE 1-2 TABLETS (50-100 MG TOTAL) BY MOUTH AT BEDTIME AS NEEDED FOR SLEEP., Disp: 180 tablet, Rfl: 0 Continue all other maintenance medications as listed above.  Follow up plan: Return in about 6 months (around  08/25/2017) for recheck.  Educational handout given for [place] PA-C [place] [address]  [place], [place] [address] [phone]   02/25/2017, 1:46 PM

## 2017-02-24 LAB — THYROID PANEL WITH TSH
Free Thyroxine Index: 1.8 (ref 1.2–4.9)
T3 Uptake Ratio: 25 % (ref 24–39)
T4, Total: 7.2 ug/dL (ref 4.5–12.0)
TSH: 5.4 u[IU]/mL — AB (ref 0.450–4.500)

## 2017-03-01 ENCOUNTER — Other Ambulatory Visit: Payer: Self-pay | Admitting: Nurse Practitioner

## 2017-03-03 ENCOUNTER — Other Ambulatory Visit: Payer: Self-pay | Admitting: Physician Assistant

## 2017-03-03 DIAGNOSIS — F32A Depression, unspecified: Secondary | ICD-10-CM

## 2017-03-03 DIAGNOSIS — F329 Major depressive disorder, single episode, unspecified: Secondary | ICD-10-CM

## 2017-03-16 ENCOUNTER — Other Ambulatory Visit: Payer: Self-pay | Admitting: Nurse Practitioner

## 2017-03-26 ENCOUNTER — Other Ambulatory Visit: Payer: Self-pay | Admitting: Physician Assistant

## 2017-03-26 DIAGNOSIS — F329 Major depressive disorder, single episode, unspecified: Secondary | ICD-10-CM

## 2017-03-26 DIAGNOSIS — F32A Depression, unspecified: Secondary | ICD-10-CM

## 2017-04-14 ENCOUNTER — Encounter (INDEPENDENT_AMBULATORY_CARE_PROVIDER_SITE_OTHER): Payer: Self-pay

## 2017-04-14 ENCOUNTER — Ambulatory Visit (INDEPENDENT_AMBULATORY_CARE_PROVIDER_SITE_OTHER): Payer: Medicare Other

## 2017-04-14 DIAGNOSIS — Z23 Encounter for immunization: Secondary | ICD-10-CM | POA: Diagnosis not present

## 2017-05-27 ENCOUNTER — Other Ambulatory Visit: Payer: Self-pay | Admitting: Physician Assistant

## 2017-05-27 NOTE — Telephone Encounter (Signed)
Last seen 02/23/17  [facility]

## 2017-06-07 ENCOUNTER — Other Ambulatory Visit: Payer: Self-pay | Admitting: Physician Assistant

## 2017-06-07 DIAGNOSIS — F32A Depression, unspecified: Secondary | ICD-10-CM

## 2017-06-07 DIAGNOSIS — F329 Major depressive disorder, single episode, unspecified: Secondary | ICD-10-CM

## 2017-08-10 ENCOUNTER — Other Ambulatory Visit: Payer: Self-pay | Admitting: Physician Assistant

## 2017-09-06 ENCOUNTER — Other Ambulatory Visit: Payer: Self-pay | Admitting: Physician Assistant

## 2017-09-06 DIAGNOSIS — F32A Depression, unspecified: Secondary | ICD-10-CM

## 2017-09-06 DIAGNOSIS — F329 Major depressive disorder, single episode, unspecified: Secondary | ICD-10-CM

## 2017-09-06 MED ORDER — BUPROPION HCL ER (XL) 300 MG PO TB24: 300.0000 mg | ORAL_TABLET | Freq: Every day | ORAL | 0 refills | Status: DC

## 2017-09-06 NOTE — Telephone Encounter (Signed)
She needs to get a refill on Wellbutrin.  Can she get a 90 day supply on this medicine instead of 30? She uses [facility].   Also, when should she be seen again? Thank you.

## 2017-09-06 NOTE — Telephone Encounter (Signed)
Patient aware rx sent and that she is due to be seen anytime now.

## 2017-10-17 ENCOUNTER — Other Ambulatory Visit: Payer: Self-pay | Admitting: Nurse Practitioner

## 2017-11-05 ENCOUNTER — Other Ambulatory Visit: Payer: Self-pay | Admitting: Physician Assistant

## 2017-11-05 DIAGNOSIS — F32A Depression, unspecified: Secondary | ICD-10-CM

## 2017-11-05 DIAGNOSIS — F329 Major depressive disorder, single episode, unspecified: Secondary | ICD-10-CM

## 2017-11-20 ENCOUNTER — Other Ambulatory Visit: Payer: Self-pay | Admitting: Physician Assistant

## 2017-11-22 NOTE — Telephone Encounter (Signed)
Last seen 02/23/17

## 2017-12-03 ENCOUNTER — Other Ambulatory Visit: Payer: Self-pay | Admitting: Physician Assistant

## 2017-12-03 DIAGNOSIS — F32A Depression, unspecified: Secondary | ICD-10-CM

## 2017-12-03 DIAGNOSIS — F329 Major depressive disorder, single episode, unspecified: Secondary | ICD-10-CM

## 2018-01-13 ENCOUNTER — Other Ambulatory Visit: Payer: Self-pay | Admitting: Family Medicine

## 2018-01-13 NOTE — Telephone Encounter (Signed)
Last seen 02/22/17  [facility]

## 2018-01-18 ENCOUNTER — Encounter: Payer: Self-pay | Admitting: Physician Assistant

## 2018-01-18 ENCOUNTER — Ambulatory Visit (INDEPENDENT_AMBULATORY_CARE_PROVIDER_SITE_OTHER): Payer: Medicare Other | Admitting: Physician Assistant

## 2018-01-18 VITALS — BP 130/60 | HR 62 | Temp 97.5°F | Ht 66.0 in | Wt 190.2 lb

## 2018-01-18 DIAGNOSIS — F32A Depression, unspecified: Secondary | ICD-10-CM

## 2018-01-18 DIAGNOSIS — I951 Orthostatic hypotension: Secondary | ICD-10-CM

## 2018-01-18 DIAGNOSIS — F329 Major depressive disorder, single episode, unspecified: Secondary | ICD-10-CM | POA: Diagnosis not present

## 2018-01-18 DIAGNOSIS — Z0000 Encounter for general adult medical examination without abnormal findings: Secondary | ICD-10-CM

## 2018-01-18 DIAGNOSIS — E039 Hypothyroidism, unspecified: Secondary | ICD-10-CM | POA: Diagnosis not present

## 2018-01-18 DIAGNOSIS — I10 Essential (primary) hypertension: Secondary | ICD-10-CM

## 2018-01-18 MED ORDER — MECLIZINE HCL 25 MG PO TABS: ORAL_TABLET | ORAL | 2 refills | Status: DC

## 2018-01-18 MED ORDER — MELOXICAM 15 MG PO TABS: 15.0000 mg | ORAL_TABLET | Freq: Every day | ORAL | 0 refills | Status: DC

## 2018-01-18 MED ORDER — ESCITALOPRAM OXALATE 20 MG PO TABS: 20.0000 mg | ORAL_TABLET | Freq: Every day | ORAL | 1 refills | Status: DC

## 2018-01-18 NOTE — Progress Notes (Signed)
BP 130/60   Pulse 62   Temp (!) 97.5 F (36.4 C) (Oral)   Ht 5' 6"  (1.676 m)   Wt 190 lb 3.2 oz (86.3 kg)   BMI 30.70 kg/m    Subjective:    Patient ID: Brenda Solis, female    DOB: [birth year], 83 y.o.   MRN: [id number]  HPI: Brenda Solis is a 83 y.o. female presenting on 01/18/2018 for Hypothyroidism and Depression  This patient comes in for recheck on her chronic medications.  She reports that she is doing extremely well with her depression.  Ever since the changes were made to the last visit she has had a great improvement in her overall energy, motivation, ability to have people in and around her.  She is back to having a weekly supper at her house with all of her children.  She does need to have labs performed.  It has  been 1 year.  All of her medications are reviewed today.   Past Medical History:  Diagnosis Date  . Depression   . Heart murmur   . Hyperlipidemia   . Hypertension   . Hypothyroidism   . Insomnia, unspecified   . Spondylosis of unspecified site without mention of myelopathy   . Thyroid disease    hypothyroidism   Relevant past medical, surgical, family and social history reviewed and updated as indicated. Interim medical history since our last visit reviewed. Allergies and medications reviewed and updated. DATA REVIEWED: CHART IN EPIC  Family History reviewed for pertinent findings.  Review of Systems  Constitutional: Negative.  Negative for activity change, fatigue and fever.  HENT: Negative.   Eyes: Negative.   Respiratory: Negative.  Negative for cough.   Cardiovascular: Negative.  Negative for chest pain.  Gastrointestinal: Negative.  Negative for abdominal pain.  Endocrine: Negative.   Genitourinary: Negative.  Negative for dysuria.  Musculoskeletal: Negative.   Skin: Negative.   Neurological: Negative.     Allergies as of 01/18/2018      Reactions   Gabapentin Other (See Comments)   Dizzy   Oxycodone Itching      Medication List         Accurate as of 01/18/18  2:05 PM. Always use your most recent med list.          ALPRAZolam 0.25 MG tablet Commonly known as:  XANAX TAKE ONE TABLET BY MOUTH AT BEDTIME   buPROPion 300 MG 24 hr tablet Commonly known as:  WELLBUTRIN XL TAKE 1 TABLET BY MOUTH EVERY DAY   cholecalciferol 1000 units tablet Commonly known as:  VITAMIN D Take 2,000 Units by mouth daily.   escitalopram 20 MG tablet Commonly known as:  LEXAPRO Take 1 tablet (20 mg total) by mouth daily.   levothyroxine 25 MCG tablet Commonly known as:  SYNTHROID, LEVOTHROID TAKE 1 TABLET (25 MCG TOTAL) BY MOUTH DAILY BEFORE BREAKFAST.   meclizine 25 MG tablet Commonly known as:  ANTIVERT TAKE ONE TABLET BY MOUTH THREE TIMES DAILY AS NEEDED FOR DIZZINESS   meloxicam 15 MG tablet Commonly known as:  MOBIC Take 1 tablet (15 mg total) by mouth daily.   risperiDONE 1 MG tablet Commonly known as:  RISPERDAL TAKE 1 TABLET (1 MG TOTAL) BY MOUTH AT BEDTIME.   traZODone 50 MG tablet Commonly known as:  DESYREL TAKE 1-2 TABLETS (50-100 MG TOTAL) BY MOUTH AT BEDTIME AS NEEDED FOR SLEEP.          Objective:  BP 130/60   Pulse 62   Temp (!) 97.5 F (36.4 C) (Oral)   Ht 5' 6"  (1.676 m)   Wt 190 lb 3.2 oz (86.3 kg)   BMI 30.70 kg/m   Allergies  Allergen Reactions  . Gabapentin Other (See Comments)    Dizzy  . Oxycodone Itching    Wt Readings from Last 3 Encounters:  01/18/18 190 lb 3.2 oz (86.3 kg)  02/23/17 195 lb 3.2 oz (88.5 kg)  11/23/16 194 lb 9.6 oz (88.3 kg)    Physical Exam  Constitutional: She is oriented to person, place, and time. She appears well-developed and well-nourished.  HENT:  Head: Normocephalic and atraumatic.  Right Ear: Tympanic membrane, external ear and ear canal normal.  Left Ear: Tympanic membrane, external ear and ear canal normal.  Nose: Nose normal. No rhinorrhea.  Mouth/Throat: Oropharynx is clear and moist and mucous membranes are normal. No oropharyngeal  exudate or posterior oropharyngeal erythema.  Eyes: Pupils are equal, round, and reactive to light. Conjunctivae and EOM are normal.  Neck: Normal range of motion. Neck supple.  Cardiovascular: Normal rate, regular rhythm, normal heart sounds and intact distal pulses.  Pulmonary/Chest: Effort normal and breath sounds normal.  Abdominal: Soft. Bowel sounds are normal.  Neurological: She is alert and oriented to person, place, and time. She has normal reflexes.  Skin: Skin is warm and dry. No rash noted.  Psychiatric: She has a normal mood and affect. Her behavior is normal. Judgment and thought content normal.    Results for orders placed or performed in visit on 02/23/17  Thyroid Panel With TSH  Result Value Ref Range   TSH 5.400 (H) 0.450 - 4.500 uIU/mL   T4, Total 7.2 4.5 - 12.0 ug/dL   T3 Uptake Ratio 25 24 - 39 %   Free Thyroxine Index 1.8 1.2 - 4.9      Assessment & Plan:   1. Depression, unspecified depression type - escitalopram (LEXAPRO) 20 MG tablet; Take 1 tablet (20 mg total) by mouth daily.  Dispense: 90 tablet; Refill: 1  2. Orthostatic hypotension This is resolved with the removal of her Coreg  3. Essential hypertension - CBC with Differential/Platelet; Future - CMP14+EGFR; Future - Lipid panel; Future - TSH; Future  4. Hypothyroidism, unspecified type - TSH; Future  5. Well adult exam - CBC with Differential/Platelet; Future - CMP14+EGFR; Future - Lipid panel; Future - TSH; Future   Continue all other maintenance medications as listed above.  Follow up plan: Return in about 1 year (around 01/19/2019) for labs soon,.  Educational handout given for [place] PA-C [facility] [address]  [place], [place] [address] [phone]   01/18/2018, 2:05 PM

## 2018-01-31 ENCOUNTER — Other Ambulatory Visit: Payer: Medicare Other

## 2018-01-31 DIAGNOSIS — Z0000 Encounter for general adult medical examination without abnormal findings: Secondary | ICD-10-CM

## 2018-01-31 DIAGNOSIS — E039 Hypothyroidism, unspecified: Secondary | ICD-10-CM | POA: Diagnosis not present

## 2018-01-31 DIAGNOSIS — I10 Essential (primary) hypertension: Secondary | ICD-10-CM | POA: Diagnosis not present

## 2018-02-01 LAB — CBC WITH DIFFERENTIAL/PLATELET
BASOS ABS: 0 10*3/uL (ref 0.0–0.2)
Basos: 1 %
EOS (ABSOLUTE): 0.2 10*3/uL (ref 0.0–0.4)
Eos: 3 %
HEMOGLOBIN: 13.1 g/dL (ref 11.1–15.9)
Hematocrit: 38.7 % (ref 34.0–46.6)
Immature Grans (Abs): 0 10*3/uL (ref 0.0–0.1)
Immature Granulocytes: 0 %
LYMPHS ABS: 2.5 10*3/uL (ref 0.7–3.1)
Lymphs: 39 %
MCH: 30.7 pg (ref 26.6–33.0)
MCHC: 33.9 g/dL (ref 31.5–35.7)
MCV: 91 fL (ref 79–97)
MONOCYTES: 9 %
MONOS ABS: 0.6 10*3/uL (ref 0.1–0.9)
Neutrophils Absolute: 3.2 10*3/uL (ref 1.4–7.0)
Neutrophils: 48 %
PLATELETS: 180 10*3/uL (ref 150–450)
RBC: 4.27 x10E6/uL (ref 3.77–5.28)
RDW: 14.6 % (ref 12.3–15.4)
WBC: 6.5 10*3/uL (ref 3.4–10.8)

## 2018-02-01 LAB — CMP14+EGFR
A/G RATIO: 2.3 — AB (ref 1.2–2.2)
ALT: 14 IU/L (ref 0–32)
AST: 19 IU/L (ref 0–40)
Albumin: 4.5 g/dL (ref 3.5–4.7)
Alkaline Phosphatase: 53 IU/L (ref 39–117)
BILIRUBIN TOTAL: 0.4 mg/dL (ref 0.0–1.2)
BUN / CREAT RATIO: 18 (ref 12–28)
BUN: 25 mg/dL (ref 8–27)
CHLORIDE: 105 mmol/L (ref 96–106)
CO2: 22 mmol/L (ref 20–29)
Calcium: 9.9 mg/dL (ref 8.7–10.3)
Creatinine, Ser: 1.37 mg/dL — ABNORMAL HIGH (ref 0.57–1.00)
GFR, EST AFRICAN AMERICAN: 42 mL/min/{1.73_m2} — AB (ref >59)
GFR, EST NON AFRICAN AMERICAN: 36 mL/min/{1.73_m2} — AB (ref >59)
GLOBULIN, TOTAL: 2 g/dL (ref 1.5–4.5)
Glucose: 92 mg/dL (ref 65–99)
POTASSIUM: 4.9 mmol/L (ref 3.5–5.2)
SODIUM: 140 mmol/L (ref 134–144)
TOTAL PROTEIN: 6.5 g/dL (ref 6.0–8.5)

## 2018-02-01 LAB — LIPID PANEL
CHOL/HDL RATIO: 3.8 ratio (ref 0.0–4.4)
CHOLESTEROL TOTAL: 188 mg/dL (ref 100–199)
HDL: 50 mg/dL (ref >39)
LDL CALC: 113 mg/dL — AB (ref 0–99)
TRIGLYCERIDES: 123 mg/dL (ref 0–149)
VLDL Cholesterol Cal: 25 mg/dL (ref 5–40)

## 2018-02-01 LAB — TSH: TSH: 5.68 u[IU]/mL — ABNORMAL HIGH (ref 0.450–4.500)

## 2018-02-03 ENCOUNTER — Other Ambulatory Visit: Payer: Self-pay | Admitting: Physician Assistant

## 2018-02-03 DIAGNOSIS — E039 Hypothyroidism, unspecified: Secondary | ICD-10-CM

## 2018-02-03 MED ORDER — LEVOTHYROXINE SODIUM 50 MCG PO TABS: 50.0000 ug | ORAL_TABLET | Freq: Every day | ORAL | 5 refills | Status: DC

## 2018-02-15 ENCOUNTER — Telehealth: Payer: Self-pay | Admitting: Physician Assistant

## 2018-02-15 NOTE — Telephone Encounter (Signed)
Patient notified . Already has an appt scheduled and she will keep

## 2018-02-15 NOTE — Telephone Encounter (Signed)
Please review and advise.

## 2018-02-15 NOTE — Telephone Encounter (Signed)
Blood pressure is running low again.  112/50 Is there anything she should do? Thanks

## 2018-02-15 NOTE — Telephone Encounter (Signed)
Increase salt in her diet, drink lots of water and plan appointment in my next available spot

## 2018-03-03 ENCOUNTER — Encounter (INDEPENDENT_AMBULATORY_CARE_PROVIDER_SITE_OTHER): Payer: Self-pay

## 2018-03-03 ENCOUNTER — Encounter: Payer: Self-pay | Admitting: *Deleted

## 2018-03-03 ENCOUNTER — Ambulatory Visit (INDEPENDENT_AMBULATORY_CARE_PROVIDER_SITE_OTHER): Payer: Medicare Other | Admitting: *Deleted

## 2018-03-03 ENCOUNTER — Other Ambulatory Visit: Payer: Self-pay | Admitting: Physician Assistant

## 2018-03-03 VITALS — BP 135/60 | HR 72 | Ht 67.0 in | Wt 190.0 lb

## 2018-03-03 DIAGNOSIS — Z0000 Encounter for general adult medical examination without abnormal findings: Secondary | ICD-10-CM

## 2018-03-03 DIAGNOSIS — F329 Major depressive disorder, single episode, unspecified: Secondary | ICD-10-CM

## 2018-03-03 DIAGNOSIS — F32A Depression, unspecified: Secondary | ICD-10-CM

## 2018-03-03 NOTE — Patient Instructions (Signed)
  Ms. Scalzo , Thank you for taking time to come for your Medicare Wellness Visit. I appreciate your ongoing commitment to your health goals. Please review the following plan we discussed and let me know if I can assist you in the future.   These are the goals we discussed: Goals    . DIET - INCREASE WATER INTAKE    . Exercise 150 min/wk Moderate Activity       This is a list of the screening recommended for you and due dates:  Health Maintenance  Topic Date Due  . Flu Shot  01/26/2018  . Tetanus Vaccine  04/28/2021  . DEXA scan (bone density measurement)  Completed  . Pneumonia vaccines  Completed

## 2018-03-03 NOTE — Progress Notes (Addendum)
Subjective:   Brenda Solis is a 83 y.o. female who presents for a Medicare Annual Wellness Visit. Brenda Solis is widowed and lives at home alone. She has 3 adult children that all live around her and she is very close with them and their spouses. She also has many grandchildren that she gets to visit with often. 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 She is very active in her church and is a member of her church group that visits people that are homebound. She is also a part of the "Good News" team at [facility]. She walks around [place] for exercise and normally goes twice a week for about an hour.   Review of Systems    Patient reports that her overall health is unchanged compared to last year.  Cardiac Risk Factors include: advanced age (>66men, >45 women);hypertension;sedentary lifestyle   All other systems negative       Current Medications (verified) Outpatient Encounter Medications as of 03/03/2018  Medication Sig  . ALPRAZolam (XANAX) 0.25 MG tablet TAKE ONE TABLET BY MOUTH AT BEDTIME  . buPROPion (WELLBUTRIN XL) 300 MG 24 hr tablet TAKE 1 TABLET BY MOUTH EVERY DAY  . cholecalciferol (VITAMIN D) 1000 UNITS tablet Take 2,000 Units by mouth daily.    Marland Kitchen escitalopram (LEXAPRO) 20 MG tablet Take 1 tablet (20 mg total) by mouth daily.  Marland Kitchen levothyroxine (SYNTHROID, LEVOTHROID) 50 MCG tablet Take 1 tablet (50 mcg total) by mouth daily before breakfast.  . meclizine (ANTIVERT) 25 MG tablet TAKE ONE TABLET BY MOUTH THREE TIMES DAILY AS NEEDED FOR DIZZINESS  . meloxicam (MOBIC) 15 MG tablet Take 1 tablet (15 mg total) by mouth daily.  . traZODone (DESYREL) 50 MG tablet TAKE 1-2 TABLETS (50-100 MG TOTAL) BY MOUTH AT BEDTIME AS NEEDED FOR SLEEP.  Marland Kitchen risperiDONE (RISPERDAL) 1 MG tablet TAKE 1 TABLET (1 MG TOTAL) BY MOUTH AT BEDTIME.   No facility-administered encounter medications on file as of 03/03/2018.     Allergies (verified) Gabapentin and Oxycodone    History: Past Medical History:  Diagnosis Date  . Depression   . Heart murmur   . Hyperlipidemia   . Hypertension   . Hypothyroidism   . Insomnia, unspecified   . Spondylosis of unspecified site without mention of myelopathy   . Thyroid disease    hypothyroidism   Past Surgical History:  Procedure Laterality Date  . APPENDECTOMY    . CATARACT EXTRACTION, BILATERAL Bilateral 2006  . [place]  . [place]  . CHOLECYSTECTOMY  1990  . DECOMPRESSIVE LUMBAR LAMINECTOMY LEVEL 1 N/A 02/01/2013   Procedure: CENTRAL DECOMPRESSIVE LUMBAR LAMINECTOMY LEVEL 1 L4-L5;  Surgeon: Tobi Bastos, MD;  Location: [facility];  Service: Orthopedics;  Laterality: N/A;  . DECOMPRESSIVE LUMBAR LAMINECTOMY LEVEL 1 N/A 11/20/2014   Procedure: REVISION L4-S1 DECOMPRESSION, L4-L5 IN SITU FUSION (LEVEL 1);  Surgeon: Melina Schools, MD;  Location: [facility];  Service: Orthopedics;  Laterality: N/A;  . KNEE SURGERY Right 2009   meniscus repair  . TOTAL HIP ARTHROPLASTY  2011   RIGHT   Family History  Problem Relation Age of Onset  . Coronary artery disease Brother        82s  . Heart disease Brother   . Cancer Mother        breast  . Osteoporosis Mother   . Heart disease Mother   . Osteoporosis Maternal Aunt   . Heart  disease Father   . Stroke Father   . Esophageal varices Brother   . Stroke Unknown        FAMILY  . Esophageal varices Unknown        FAMILY   Social History   Socioeconomic History  . Marital status: Widowed    Spouse name: Not on file  . Number of children: 3  . Years of education: Not on file  . Highest education level: Not on file  Occupational History    Employer: RETIRED  Social Needs  . Financial resource strain: Not hard at all  . Food insecurity:    Worry: Never true    Inability: Never true  . Transportation needs:    Medical: No    Non-medical: No  Tobacco Use  . Smoking status: Never Smoker  . Smokeless tobacco: Never  Used  Substance and Sexual Activity  . Alcohol use: No    Alcohol/week: 0.0 standard drinks  . Drug use: No  . Sexual activity: Not Currently  Lifestyle  . Physical activity:    Days per week: 2 days    Minutes per session: 60 min  . Stress: Only a little  Relationships  . Social connections:    Talks on phone: More than three times a week    Gets together: More than three times a week    Attends religious service: Not on file    Active member of club or organization: Yes    Attends meetings of clubs or organizations: More than 4 times per year    Relationship status: Widowed  Other Topics Concern  . Not on file  Social History Narrative  . Not on file    Tobacco Use No.  Clinical Intake:     Pain : No/denies pain     Nutritional Status: BMI 25 -29 Overweight Diabetes: No  How often do you need to have someone help you when you read instructions, pamphlets, or other written materials from your doctor or pharmacy?: 1 - Never What is the last grade level you completed in school?: GED     Information entered by :: Chong Sicilian, RN   Activities of Daily Living In your present state of health, do you have any difficulty performing the following activities: 03/03/2018  Hearing? Y  Comment some difficulty with hearing in a crowd  Vision? N  Difficulty concentrating or making decisions? N  Walking or climbing stairs? N  Dressing or bathing? N  Doing errands, shopping? N  Preparing Food and eating ? N  Using the Toilet? N  In the past six months, have you accidently leaked urine? N  Do you have problems with loss of bowel control? N  Managing your Medications? N  Managing your Finances? N  Housekeeping or managing your Housekeeping? N  Some recent data might be hidden    Diet Breakfast-eats two slices of whole wheat toast with small amount of jelly at breakfast Lunch-usually has a light lunch if anything Supper-Son and daughter-in-law bring her evening meals  often  Does not snack very often  Drinks water with flavor packets, tea, and coffee  Exercise Current Exercise Habits: Home exercise routine, Type of exercise: walking, Time (Minutes): 60, Frequency (Times/Week): 2, Weekly Exercise (Minutes/Week): 120, Intensity: Mild, Exercise limited by: Other - see comments   Depression Screen PHQ 2/9 Scores 01/18/2018 02/23/2017 11/01/2016 10/20/2016 09/22/2016 04/24/2016 02/23/2016  PHQ - 2 Score 0 0 4 4 6  0 6  PHQ- 9 Score - -  14 14 13  - 10     Fall Risk Fall Risk  03/03/2018 03/03/2018 01/18/2018 02/23/2017 04/24/2016  Falls in the past year? No No Yes Yes No  Number falls in past yr: - - 2 or more 2 or more -  Injury with Fall? No - No Yes -  Risk Factor Category  High Fall Risk - - High Fall Risk -  Risk for fall due to : - - - - -  Follow up - - - Falls prevention discussed -    Safety Is the patient's home free of loose throw rugs in walkways, pet beds, electrical cords, etc?   yes      Grab bars in the bathroom? yes      Walkin shower? no      Shower Seat? yes      Handrails on the stairs?   yes      Adequate lighting?   yes  Patient Care Team: Theodoro Clock as PCP - General (Physician Assistant)  Hospitalizations, surgeries, and ER visits in previous 12 months No hospitalizations, ER visits, or surgeries this past year.  Objective:    Today's Vitals   03/03/18 1509  BP: 135/60  Pulse: 72  Weight: 190 lb (86.2 kg)  Height: 5\' 7"  (1.702 m)   Body mass index is 29.76 kg/m.  Advanced Directives 03/03/2018 07/28/2015 11/15/2014 10/16/2014 06/15/2014 06/15/2014 02/01/2013  Does Patient Have a Medical Advance Directive? No No No No No No Patient does not have advance directive;Patient would like information  Would patient like information on creating a medical advance directive? No - Patient declined - Yes - Scientist, clinical (histocompatibility and immunogenetics) given Yes - Scientist, clinical (histocompatibility and immunogenetics) given No - patient declined information No - patient declined information  Other (Comment)  Pre-existing out of facility DNR order (yellow form or pink MOST form) - - - - - - No    Hearing/Vision  No hearing or vision deficits noted during visit.  Cognitive Function: MMSE - Mini Mental State Exam 03/03/2018 10/17/2014  Orientation to time 5 5  Orientation to Place - 5  Registration 3 3  Attention/ Calculation 4 5  Recall 2 3  Language- name 2 objects 2 2  Language- repeat 1 1  Language- follow 3 step command 3 3  Language- read & follow direction 1 1  Write a sentence 1 1  Copy design 0 1  Total score - 30       Normal Cognitive Function Screening: Yes    Immunizations and Health Maintenance Immunization History  Administered Date(s) Administered  . Influenza, High Dose Seasonal PF 05/06/2016, 04/14/2017  . Influenza,inj,Quad PF,6+ Mos 03/28/2013, 04/24/2014, 06/20/2015  . Pneumococcal Conjugate-13 10/16/2014  . Pneumococcal Polysaccharide-23 05/28/2006  . Td 06/29/1995   Health Maintenance Due  Topic Date Due  . INFLUENZA VACCINE  01/26/2018   Health Maintenance  Topic Date Due  . INFLUENZA VACCINE  01/26/2018  . TETANUS/TDAP  04/28/2021  . DEXA SCAN  Completed  . PNA vac Low Risk Adult  Completed        Assessment:   This is a routine wellness examination for [place].    Plan:    Goals    . DIET - INCREASE WATER INTAKE    . Exercise 150 min/wk Moderate Activity        Health Maintenance Recommendations: Dexa-will have at next visit     Additional Screening Recommendations: Lung: Low Dose CT Chest recommended if Age 74-80 years, 30 pack-year currently  smoking OR have quit w/in 15years. Patient does not qualify. Hepatitis C Screening recommended: not applicable   Keep f/u with Terald Sleeper, PA-C and any other specialty appointments you may have Continue current medications Move carefully to avoid falls. Use assistive devices like a cane or walker if needed. Aim for at least 150 minutes of moderate activity a week. This  can be done with chair exercises if necessary. Read or work on puzzles daily Stay connected with friends and family  I have personally reviewed and noted the following in the patient's chart:   . Medical and social history . Use of alcohol, tobacco or illicit drugs  . Current medications and supplements . Functional ability and status . Nutritional status . Physical activity . Advanced directives . List of other physicians . Hospitalizations, surgeries, and ER visits in previous 12 months . Vitals . Screenings to include cognitive, depression, and falls . Referrals and appointments  In addition, I have reviewed and discussed with patient certain preventive protocols, quality metrics, and best practice recommendations. A written personalized care plan for preventive services as well as general preventive health recommendations were provided to patient.     Chong Sicilian, RN   03/03/2018   I have reviewed and agree with the above AWV documentation.   Terald Sleeper PA-C [facility] [address]  [place], [place] [address] [phone]

## 2018-03-08 ENCOUNTER — Encounter: Payer: Self-pay | Admitting: Physician Assistant

## 2018-03-08 ENCOUNTER — Ambulatory Visit (INDEPENDENT_AMBULATORY_CARE_PROVIDER_SITE_OTHER): Payer: Medicare Other | Admitting: Physician Assistant

## 2018-03-08 VITALS — BP 146/99 | HR 68 | Temp 100.7°F | Ht 67.0 in | Wt 199.0 lb

## 2018-03-08 DIAGNOSIS — J209 Acute bronchitis, unspecified: Secondary | ICD-10-CM

## 2018-03-08 MED ORDER — METHYLPREDNISOLONE ACETATE 80 MG/ML IJ SUSP
80.0000 mg | Freq: Once | INTRAMUSCULAR | Status: AC
  Administered 2018-03-08: 80 mg via INTRAMUSCULAR

## 2018-03-08 MED ORDER — AZITHROMYCIN 250 MG PO TABS: ORAL_TABLET | ORAL | 0 refills | Status: DC

## 2018-03-08 NOTE — Progress Notes (Signed)
BP (!) 146/99   Pulse 68   Temp (!) 100.7 F (38.2 C) (Oral)   Ht 5\' 7"  (1.702 m)   Wt 199 lb (90.3 kg)   BMI 31.17 kg/m    Subjective:    Patient ID: Brenda Solis, female    DOB: 10-31-1934, 83 y.o.   MRN: [id number]  HPI: Brenda Solis is a 83 y.o. female presenting on 03/08/2018 for Cough  Patient with several days of progressing upper respiratory and bronchial symptoms. Initially there was more upper respiratory congestion. This progressed to having significant cough that is productive throughout the day and severe at night. There is occasional wheezing after coughing. Sometimes there is slight dyspnea on exertion. It is productive mucus that is yellow in color. Denies any blood.   Past Medical History:  Diagnosis Date  . Depression   . Heart murmur   . Hyperlipidemia   . Hypertension   . Hypothyroidism   . Insomnia, unspecified   . Spondylosis of unspecified site without mention of myelopathy   . Thyroid disease    hypothyroidism   Relevant past medical, surgical, family and social history reviewed and updated as indicated. Interim medical history since our last visit reviewed. Allergies and medications reviewed and updated. DATA REVIEWED: CHART IN EPIC  Family History reviewed for pertinent findings.  Review of Systems  Constitutional: Positive for chills, fatigue and fever. Negative for activity change and appetite change.  HENT: Positive for congestion, postnasal drip and sore throat.   Eyes: Negative.   Respiratory: Positive for cough. Negative for shortness of breath and wheezing.   Cardiovascular: Negative.  Negative for chest pain, palpitations and leg swelling.  Gastrointestinal: Negative.   Genitourinary: Negative.   Musculoskeletal: Negative.   Skin: Negative.   Neurological: Positive for headaches.    Allergies as of 03/08/2018      Reactions   Gabapentin Other (See Comments)   Dizzy   Oxycodone Itching      Medication List        Accurate  as of 03/08/18  2:31 PM. Always use your most recent med list.          ALPRAZolam 0.25 MG tablet Commonly known as:  XANAX TAKE ONE TABLET BY MOUTH AT BEDTIME   azithromycin 250 MG tablet Commonly known as:  ZITHROMAX Take as directed   buPROPion 300 MG 24 hr tablet Commonly known as:  WELLBUTRIN XL TAKE 1 TABLET BY MOUTH EVERY DAY   cholecalciferol 1000 units tablet Commonly known as:  VITAMIN D Take 2,000 Units by mouth daily.   escitalopram 20 MG tablet Commonly known as:  LEXAPRO Take 1 tablet (20 mg total) by mouth daily.   levothyroxine 50 MCG tablet Commonly known as:  SYNTHROID, LEVOTHROID Take 1 tablet (50 mcg total) by mouth daily before breakfast.   meclizine 25 MG tablet Commonly known as:  ANTIVERT TAKE ONE TABLET BY MOUTH THREE TIMES DAILY AS NEEDED FOR DIZZINESS   meloxicam 15 MG tablet Commonly known as:  MOBIC Take 1 tablet (15 mg total) by mouth daily.   traZODone 50 MG tablet Commonly known as:  DESYREL TAKE 1-2 TABLETS (50-100 MG TOTAL) BY MOUTH AT BEDTIME AS NEEDED FOR SLEEP.          Objective:    BP (!) 146/99   Pulse 68   Temp (!) 100.7 F (38.2 C) (Oral)   Ht 5\' 7"  (1.702 m)   Wt 199 lb (90.3 kg)   BMI 31.17  kg/m   Allergies  Allergen Reactions  . Gabapentin Other (See Comments)    Dizzy  . Oxycodone Itching    Wt Readings from Last 3 Encounters:  03/08/18 199 lb (90.3 kg)  03/03/18 190 lb (86.2 kg)  01/18/18 190 lb 3.2 oz (86.3 kg)    Physical Exam  Constitutional: She is oriented to person, place, and time. She appears well-developed and well-nourished.  HENT:  Head: Normocephalic and atraumatic.  Right Ear: A middle ear effusion is present.  Left Ear: A middle ear effusion is present.  Nose: Mucosal edema present. Right sinus exhibits no frontal sinus tenderness. Left sinus exhibits no frontal sinus tenderness.  Mouth/Throat: Posterior oropharyngeal erythema present. No oropharyngeal exudate or tonsillar abscesses.    Eyes: Pupils are equal, round, and reactive to light. Conjunctivae and EOM are normal.  Neck: Normal range of motion.  Cardiovascular: Normal rate, regular rhythm, normal heart sounds and intact distal pulses.  Pulmonary/Chest: Effort normal and breath sounds normal.  Abdominal: Soft. Bowel sounds are normal.  Neurological: She is alert and oriented to person, place, and time. She has normal reflexes.  Skin: Skin is warm and dry. No rash noted.  Psychiatric: She has a normal mood and affect. Her behavior is normal. Judgment and thought content normal.  Nursing note and vitals reviewed.       Assessment & Plan:   1. Acute bronchitis, unspecified organism - methylPREDNISolone acetate (DEPO-MEDROL) injection 80 mg - azithromycin (ZITHROMAX Z-PAK) 250 MG tablet; Take as directed  Dispense: 6 each; Refill: 0   Continue all other maintenance medications as listed above.  Follow up plan: No follow-ups on file.  Educational handout given for [place] PA-C [facility] [address]  [place], [place] [address] [phone]   03/08/2018, 2:31 PM

## 2018-03-09 ENCOUNTER — Other Ambulatory Visit: Payer: Self-pay | Admitting: Physician Assistant

## 2018-03-09 ENCOUNTER — Telehealth: Payer: Self-pay | Admitting: Physician Assistant

## 2018-03-09 MED ORDER — BENZONATATE 200 MG PO CAPS: 200.0000 mg | ORAL_CAPSULE | Freq: Two times a day (BID) | ORAL | 2 refills | Status: DC | PRN

## 2018-03-09 NOTE — Telephone Encounter (Signed)
Sent the [employer] to her pharmacy.

## 2018-03-09 NOTE — Telephone Encounter (Signed)
Patient daughter in law aware

## 2018-03-10 ENCOUNTER — Telehealth: Payer: Self-pay | Admitting: Physician Assistant

## 2018-03-10 ENCOUNTER — Other Ambulatory Visit: Payer: Self-pay | Admitting: Family Medicine

## 2018-03-10 MED ORDER — ALBUTEROL SULFATE HFA 108 (90 BASE) MCG/ACT IN AERS
2.0000 | INHALATION_SPRAY | Freq: Four times a day (QID) | RESPIRATORY_TRACT | 0 refills | Status: DC | PRN
Start: 2018-03-10 — End: 2018-03-28

## 2018-03-10 NOTE — Telephone Encounter (Signed)
Patient was seen for bronchitis and states she would like a inhaler for her cough

## 2018-03-10 NOTE — Telephone Encounter (Signed)
Inhaler sent.  If symptoms persist or worsen, she should be seen

## 2018-03-10 NOTE — Telephone Encounter (Signed)
Patient aware.

## 2018-03-28 ENCOUNTER — Other Ambulatory Visit: Payer: Self-pay | Admitting: Family Medicine

## 2018-03-30 ENCOUNTER — Ambulatory Visit (INDEPENDENT_AMBULATORY_CARE_PROVIDER_SITE_OTHER): Payer: Medicare Other | Admitting: *Deleted

## 2018-03-30 DIAGNOSIS — Z23 Encounter for immunization: Secondary | ICD-10-CM | POA: Diagnosis not present

## 2018-03-30 NOTE — Progress Notes (Signed)
Flu shot given and tolerated well.

## 2018-05-04 DIAGNOSIS — L03031 Cellulitis of right toe: Secondary | ICD-10-CM | POA: Diagnosis not present

## 2018-05-04 DIAGNOSIS — L03032 Cellulitis of left toe: Secondary | ICD-10-CM | POA: Diagnosis not present

## 2018-05-22 ENCOUNTER — Other Ambulatory Visit: Payer: Self-pay | Admitting: Physician Assistant

## 2018-05-22 NOTE — Telephone Encounter (Signed)
Last seen 9/19  [facility]

## 2018-05-23 ENCOUNTER — Telehealth: Payer: Self-pay | Admitting: Physician Assistant

## 2018-05-23 ENCOUNTER — Other Ambulatory Visit: Payer: Self-pay | Admitting: Physician Assistant

## 2018-05-23 MED ORDER — AZITHROMYCIN 250 MG PO TABS: ORAL_TABLET | ORAL | 0 refills | Status: DC

## 2018-05-23 NOTE — Telephone Encounter (Signed)
Z-Pak is sent to the pharmacy.

## 2018-05-23 NOTE — Telephone Encounter (Signed)
Patient has started a cough since Saturday 11/23.  She is starting to feel bad coughing up mucus.  Feels like it is going into bronchitis like she usually does.  Is is possible to call her an antibiotic in please.  Please call if you could.

## 2018-05-23 NOTE — Telephone Encounter (Signed)
Patient aware.

## 2018-06-13 ENCOUNTER — Other Ambulatory Visit: Payer: Self-pay | Admitting: Physician Assistant

## 2018-06-13 DIAGNOSIS — F32A Depression, unspecified: Secondary | ICD-10-CM

## 2018-06-13 DIAGNOSIS — F329 Major depressive disorder, single episode, unspecified: Secondary | ICD-10-CM

## 2018-07-30 ENCOUNTER — Other Ambulatory Visit: Payer: Self-pay | Admitting: Physician Assistant

## 2018-08-01 DIAGNOSIS — L03031 Cellulitis of right toe: Secondary | ICD-10-CM | POA: Diagnosis not present

## 2018-08-01 DIAGNOSIS — L03032 Cellulitis of left toe: Secondary | ICD-10-CM | POA: Diagnosis not present

## 2018-08-01 DIAGNOSIS — M79676 Pain in unspecified toe(s): Secondary | ICD-10-CM | POA: Diagnosis not present

## 2018-08-02 ENCOUNTER — Other Ambulatory Visit: Payer: Self-pay | Admitting: Physician Assistant

## 2018-08-02 DIAGNOSIS — F329 Major depressive disorder, single episode, unspecified: Secondary | ICD-10-CM

## 2018-08-02 DIAGNOSIS — F32A Depression, unspecified: Secondary | ICD-10-CM

## 2018-08-13 ENCOUNTER — Other Ambulatory Visit: Payer: Self-pay | Admitting: Physician Assistant

## 2018-08-13 DIAGNOSIS — E039 Hypothyroidism, unspecified: Secondary | ICD-10-CM

## 2018-08-22 DIAGNOSIS — L03032 Cellulitis of left toe: Secondary | ICD-10-CM | POA: Diagnosis not present

## 2018-08-22 DIAGNOSIS — L03031 Cellulitis of right toe: Secondary | ICD-10-CM | POA: Diagnosis not present

## 2018-09-13 ENCOUNTER — Telehealth: Payer: Self-pay | Admitting: Physician Assistant

## 2018-09-13 NOTE — Telephone Encounter (Signed)
Patient of Glenard Haring. Please review and advise

## 2018-09-13 NOTE — Telephone Encounter (Signed)
Patient notified and verbalized understanding. 

## 2018-09-13 NOTE — Telephone Encounter (Signed)
What symptoms do you have? Butt to low back stabbing pain. Want something for inflammation  How long have you been sick? month  Have you been seen for this problem? NO  If your provider decides to give you a prescription, which pharmacy would you like for it to be sent to? [facility]   Patient informed that this information will be sent to the clinical staff for review and that they should receive a follow up call.

## 2018-09-13 NOTE — Telephone Encounter (Signed)
This sounds like sciatica.  Is she taking her Meloxicam?  If so, I recommend adding scheduled tylenol every 8 hours.  Use heat, muscle rub (bengay/ blu emu/ biofreeze).  Stretching is recommending.    If she develops worsening pain, numbness in her legs or groin, falls, difficulty urinating or fecal incontinence, she should seek medical attention.

## 2018-09-13 NOTE — Telephone Encounter (Signed)
Aware. 

## 2018-09-28 ENCOUNTER — Other Ambulatory Visit: Payer: Self-pay | Admitting: Physician Assistant

## 2018-09-28 DIAGNOSIS — F329 Major depressive disorder, single episode, unspecified: Secondary | ICD-10-CM

## 2018-09-28 DIAGNOSIS — F32A Depression, unspecified: Secondary | ICD-10-CM

## 2018-10-26 ENCOUNTER — Other Ambulatory Visit: Payer: Self-pay

## 2018-10-26 ENCOUNTER — Ambulatory Visit (INDEPENDENT_AMBULATORY_CARE_PROVIDER_SITE_OTHER): Payer: Medicare Other

## 2018-10-26 ENCOUNTER — Ambulatory Visit (INDEPENDENT_AMBULATORY_CARE_PROVIDER_SITE_OTHER): Payer: Medicare Other | Admitting: Family Medicine

## 2018-10-26 ENCOUNTER — Encounter: Payer: Self-pay | Admitting: Family Medicine

## 2018-10-26 VITALS — BP 140/66 | HR 71 | Temp 98.4°F

## 2018-10-26 DIAGNOSIS — M25551 Pain in right hip: Secondary | ICD-10-CM

## 2018-10-26 DIAGNOSIS — S79911A Unspecified injury of right hip, initial encounter: Secondary | ICD-10-CM | POA: Diagnosis not present

## 2018-10-26 MED ORDER — PREDNISONE 10 MG PO TABS: ORAL_TABLET | ORAL | 0 refills | Status: DC

## 2018-10-26 NOTE — Progress Notes (Signed)
Chief Complaint  Patient presents with  . pain in right hip    Raidating down leg    HPI  Patient presents today for fell 3weeks ago. Grab bar broke as she sat down on her commode.  PMH: Smoking status noted ROS: Per HPI  Objective: BP 140/66   Pulse 71   Temp 98.4 F (36.9 C) (Oral)  Gen: NAD, alert, cooperative with exam HEENT: NCAT, EOMI, PERRL CV: RRR, good S1/S2, no murmur Resp: CTABL, no wheezes, non-labored Ext: No edema, warm. Tender at right greater trochanter. FROM RLE, but painful for hip rotation. Neuro: Alert and oriented, No gross deficits R hip XR - no fx noted. Assessment and plan:  1. Right hip pain     Heat, decreased use. OTC analgesia  Orders Placed This Encounter  Procedures  . DG HIP UNILAT W OR W/O PELVIS 2-3 VIEWS RIGHT    Standing Status:   Future    Number of Occurrences:   1    Standing Expiration Date:   12/25/2019    Order Specific Question:   Reason for Exam (SYMPTOM  OR DIAGNOSIS REQUIRED)    Answer:   [place] and injured hip causing pain.    Order Specific Question:   Preferred imaging location?    Answer:   Internal    Follow up as needed.  Claretta Fraise, MD

## 2018-10-29 ENCOUNTER — Encounter: Payer: Self-pay | Admitting: Family Medicine

## 2018-10-30 ENCOUNTER — Other Ambulatory Visit: Payer: Self-pay

## 2018-10-30 DIAGNOSIS — R2681 Unsteadiness on feet: Secondary | ICD-10-CM

## 2018-11-02 ENCOUNTER — Telehealth: Payer: Self-pay | Admitting: Physician Assistant

## 2018-11-02 NOTE — Telephone Encounter (Signed)
Pt scheduled with AJ tomorrow at 11:40.

## 2018-11-02 NOTE — Telephone Encounter (Signed)
Hip x-ray wnl at her visit 10/26/18 with [facility], does she ntbs again and re-evaluated?

## 2018-11-02 NOTE — Telephone Encounter (Signed)
If she is needing Tramadol, she will have to see Glenard Haring for this since it is controlled.  Will defer to her but she will likely warrant reevaluation.  Can you see if she can be scheduled with Glenard Haring to be seen tomorrow?

## 2018-11-03 ENCOUNTER — Encounter: Payer: Self-pay | Admitting: Physician Assistant

## 2018-11-03 ENCOUNTER — Ambulatory Visit (INDEPENDENT_AMBULATORY_CARE_PROVIDER_SITE_OTHER): Payer: Medicare Other | Admitting: Physician Assistant

## 2018-11-03 ENCOUNTER — Other Ambulatory Visit: Payer: Self-pay

## 2018-11-03 VITALS — BP 137/58 | HR 62 | Temp 98.5°F | Ht 67.0 in | Wt 199.0 lb

## 2018-11-03 DIAGNOSIS — F329 Major depressive disorder, single episode, unspecified: Secondary | ICD-10-CM

## 2018-11-03 DIAGNOSIS — E039 Hypothyroidism, unspecified: Secondary | ICD-10-CM | POA: Diagnosis not present

## 2018-11-03 DIAGNOSIS — I10 Essential (primary) hypertension: Secondary | ICD-10-CM

## 2018-11-03 DIAGNOSIS — M47816 Spondylosis without myelopathy or radiculopathy, lumbar region: Secondary | ICD-10-CM | POA: Diagnosis not present

## 2018-11-03 DIAGNOSIS — F32A Depression, unspecified: Secondary | ICD-10-CM

## 2018-11-03 MED ORDER — TRAMADOL HCL 50 MG PO TABS: 50.0000 mg | ORAL_TABLET | Freq: Four times a day (QID) | ORAL | 0 refills | Status: AC | PRN

## 2018-11-03 MED ORDER — ESCITALOPRAM OXALATE 20 MG PO TABS: 20.0000 mg | ORAL_TABLET | Freq: Every day | ORAL | 1 refills | Status: DC

## 2018-11-03 MED ORDER — BUPROPION HCL ER (XL) 300 MG PO TB24: 300.0000 mg | ORAL_TABLET | Freq: Every day | ORAL | 1 refills | Status: DC

## 2018-11-03 MED ORDER — PREDNISONE 10 MG (21) PO TBPK: ORAL_TABLET | ORAL | 0 refills | Status: DC

## 2018-11-03 MED ORDER — TRAZODONE HCL 50 MG PO TABS: 50.0000 mg | ORAL_TABLET | Freq: Every evening | ORAL | 1 refills | Status: DC | PRN

## 2018-11-03 MED ORDER — LEVOTHYROXINE SODIUM 50 MCG PO TABS: ORAL_TABLET | ORAL | 1 refills | Status: DC

## 2018-11-04 LAB — CMP14+EGFR
ALT: 20 IU/L (ref 0–32)
AST: 20 IU/L (ref 0–40)
Albumin/Globulin Ratio: 2.1 (ref 1.2–2.2)
Albumin: 4.8 g/dL — ABNORMAL HIGH (ref 3.6–4.6)
Alkaline Phosphatase: 52 IU/L (ref 39–117)
BUN/Creatinine Ratio: 19 (ref 12–28)
BUN: 29 mg/dL — ABNORMAL HIGH (ref 8–27)
Bilirubin Total: 0.6 mg/dL (ref 0.0–1.2)
CO2: 21 mmol/L (ref 20–29)
Calcium: 10.5 mg/dL — ABNORMAL HIGH (ref 8.7–10.3)
Chloride: 101 mmol/L (ref 96–106)
Creatinine, Ser: 1.51 mg/dL — ABNORMAL HIGH (ref 0.57–1.00)
GFR calc Af Amer: 37 mL/min/{1.73_m2} — ABNORMAL LOW (ref >59)
GFR calc non Af Amer: 32 mL/min/{1.73_m2} — ABNORMAL LOW (ref >59)
Globulin, Total: 2.3 g/dL (ref 1.5–4.5)
Glucose: 84 mg/dL (ref 65–99)
Potassium: 4.6 mmol/L (ref 3.5–5.2)
Sodium: 141 mmol/L (ref 134–144)
Total Protein: 7.1 g/dL (ref 6.0–8.5)

## 2018-11-04 LAB — TSH: TSH: 4.83 u[IU]/mL — ABNORMAL HIGH (ref 0.450–4.500)

## 2018-11-04 LAB — CBC WITH DIFFERENTIAL/PLATELET
Basophils Absolute: 0.1 10*3/uL (ref 0.0–0.2)
Basos: 0 %
EOS (ABSOLUTE): 0.1 10*3/uL (ref 0.0–0.4)
Eos: 1 %
Hematocrit: 38.6 % (ref 34.0–46.6)
Hemoglobin: 13.5 g/dL (ref 11.1–15.9)
Immature Grans (Abs): 0.1 10*3/uL (ref 0.0–0.1)
Immature Granulocytes: 1 %
Lymphocytes Absolute: 3.1 10*3/uL (ref 0.7–3.1)
Lymphs: 26 %
MCH: 31 pg (ref 26.6–33.0)
MCHC: 35 g/dL (ref 31.5–35.7)
MCV: 89 fL (ref 79–97)
Monocytes Absolute: 0.9 10*3/uL (ref 0.1–0.9)
Monocytes: 8 %
Neutrophils Absolute: 7.4 10*3/uL — ABNORMAL HIGH (ref 1.4–7.0)
Neutrophils: 64 %
Platelets: 266 10*3/uL (ref 150–450)
RBC: 4.36 x10E6/uL (ref 3.77–5.28)
RDW: 13.9 % (ref 11.7–15.4)
WBC: 11.7 10*3/uL — ABNORMAL HIGH (ref 3.4–10.8)

## 2018-11-04 LAB — LIPID PANEL
Chol/HDL Ratio: 2.7 ratio (ref 0.0–4.4)
Cholesterol, Total: 181 mg/dL (ref 100–199)
HDL: 67 mg/dL (ref >39)
LDL Calculated: 82 mg/dL (ref 0–99)
Triglycerides: 162 mg/dL — ABNORMAL HIGH (ref 0–149)
VLDL Cholesterol Cal: 32 mg/dL (ref 5–40)

## 2018-11-06 ENCOUNTER — Encounter: Payer: Self-pay | Admitting: Physician Assistant

## 2018-11-06 NOTE — Progress Notes (Signed)
BP (!) 137/58   Pulse 62   Temp 98.5 F (36.9 C) (Oral)   Ht 5' 7"  (1.702 m)   Wt 199 lb (90.3 kg)   BMI 31.17 kg/m    Subjective:    Patient ID: Brenda Solis, female    DOB: Nov 17, 1934, 84 y.o.   MRN: [id number]  HPI: Brenda Solis is a 84 y.o. female presenting on 11/03/2018 for Hip Pain (right )  This patient comes in for periodic recheck on all of her medical conditions.  She does have chronic hip pain and she recently had a fall and her hip is hurting again.  We will have this evaluated.  She has known degenerative disc disease also.  She had been given a little bit of tramadol Lilia Pro a refill that for to be able to continue to use for the hip pain.  Hypertension, well controlled.  She does need refills on her medications and labs she denies any chest pain or shortness of breath or headache.  Depression, well controlled.  She is doing exceptionally well on her medications and feels very good.  She is active with her family and church and there is nothing holding her back at this point.  Hypothyroidism, labs are due.  We will refill medications and have labs performed in the near future.  Depression screen [facility] 2/9 11/03/2018 10/26/2018 03/08/2018 01/18/2018 02/23/2017  Decreased Interest 0 0 0 0 0  Down, Depressed, Hopeless 0 0 0 0 0  PHQ - 2 Score 0 0 0 0 0  Altered sleeping - - - - -  Tired, decreased energy - - - - -  Change in appetite - - - - -  Feeling bad or failure about yourself  - - - - -  Trouble concentrating - - - - -  Moving slowly or fidgety/restless - - - - -  Suicidal thoughts - - - - -  PHQ-9 Score - - - - -  Difficult doing work/chores - - - - -  Some recent data might be hidden     Past Medical History:  Diagnosis Date  . Depression   . Heart murmur   . Hyperlipidemia   . Hypertension   . Hypothyroidism   . Insomnia, unspecified   . Spondylosis of unspecified site without mention of myelopathy   . Thyroid disease    hypothyroidism   Relevant  past medical, surgical, family and social history reviewed and updated as indicated. Interim medical history since our last visit reviewed. Allergies and medications reviewed and updated. DATA REVIEWED: CHART IN EPIC  Family History reviewed for pertinent findings.  Review of Systems  Constitutional: Negative.   HENT: Negative.   Eyes: Negative.   Respiratory: Negative.   Gastrointestinal: Negative.   Genitourinary: Negative.   Musculoskeletal: Positive for arthralgias. Negative for gait problem, joint swelling and myalgias.    Allergies as of 11/03/2018      Reactions   Gabapentin Other (See Comments)   Dizzy   Oxycodone Itching      Medication List       Accurate as of Nov 03, 2018 11:59 PM. If you have any questions, ask your nurse or doctor.        STOP taking these medications   predniSONE 10 MG tablet Commonly known as:  DELTASONE Replaced by:  predniSONE 10 MG (21) Tbpk tablet Stopped by:  Terald Sleeper, PA-C   ProAir HFA [phone] Base) MCG/ACT inhaler Generic drug:  albuterol Stopped by:  Terald Sleeper, PA-C     TAKE these medications   ALPRAZolam 0.25 MG tablet Commonly known as:  XANAX TAKE ONE TABLET BY MOUTH AT BEDTIME   buPROPion 300 MG 24 hr tablet Commonly known as:  WELLBUTRIN XL Take 1 tablet (300 mg total) by mouth daily.   cholecalciferol 1000 units tablet Commonly known as:  VITAMIN D Take 2,000 Units by mouth daily.   escitalopram 20 MG tablet Commonly known as:  LEXAPRO Take 1 tablet (20 mg total) by mouth daily.   levothyroxine 50 MCG tablet Commonly known as:  SYNTHROID TAKE 1 TABLET BY MOUTH DAILY BEFORE BREAKFAST   meclizine 25 MG tablet Commonly known as:  ANTIVERT TAKE ONE TABLET BY MOUTH THREE TIMES DAILY AS NEEDED FOR DIZZINESS   meloxicam 15 MG tablet Commonly known as:  MOBIC TAKE 1 TABLET BY MOUTH EVERY DAY   predniSONE 10 MG (21) Tbpk tablet Commonly known as:  STERAPRED UNI-PAK 21 TAB As directed x 6 days  Replaces:  predniSONE 10 MG tablet Started by:  Terald Sleeper, PA-C   traMADol 50 MG tablet Commonly known as:  ULTRAM Take 1 tablet (50 mg total) by mouth every 6 (six) hours as needed for up to 5 days. Started by:  Terald Sleeper, PA-C   traZODone 50 MG tablet Commonly known as:  DESYREL Take 1-2 tablets (50-100 mg total) by mouth at bedtime as needed for sleep.          Objective:    BP (!) 137/58   Pulse 62   Temp 98.5 F (36.9 C) (Oral)   Ht 5' 7"  (1.702 m)   Wt 199 lb (90.3 kg)   BMI 31.17 kg/m   Allergies  Allergen Reactions  . Gabapentin Other (See Comments)    Dizzy  . Oxycodone Itching    Wt Readings from Last 3 Encounters:  11/03/18 199 lb (90.3 kg)  03/08/18 199 lb (90.3 kg)  03/03/18 190 lb (86.2 kg)    Physical Exam Constitutional:      Appearance: She is well-developed.  HENT:     Head: Normocephalic and atraumatic.  Eyes:     Conjunctiva/sclera: Conjunctivae normal.     Pupils: Pupils are equal, round, and reactive to light.  Cardiovascular:     Rate and Rhythm: Normal rate and regular rhythm.     Heart sounds: Normal heart sounds.  Pulmonary:     Effort: Pulmonary effort is normal.     Breath sounds: Normal breath sounds.  Abdominal:     General: Bowel sounds are normal.     Palpations: Abdomen is soft.  Musculoskeletal:        General: Tenderness and signs of injury present.     Lumbar back: She exhibits decreased range of motion, tenderness and pain. She exhibits no deformity and no spasm.       Back:  Skin:    General: Skin is warm and dry.     Findings: No rash.  Neurological:     Mental Status: She is alert and oriented to person, place, and time.     Deep Tendon Reflexes: Reflexes are normal and symmetric.  Psychiatric:        Behavior: Behavior normal.        Thought Content: Thought content normal.        Judgment: Judgment normal.     Results for orders placed or performed in visit on 11/03/18  CMP14+EGFR  Result  Value Ref Range   Glucose 84 65 - 99 mg/dL   BUN 29 (H) 8 - 27 mg/dL   Creatinine, Ser 1.51 (H) 0.57 - 1.00 mg/dL   GFR calc non Af Amer 32 (L) >59 mL/min/1.73   GFR calc Af Amer 37 (L) >59 mL/min/1.73   BUN/Creatinine Ratio 19 12 - 28   Sodium 141 134 - 144 mmol/L   Potassium 4.6 3.5 - 5.2 mmol/L   Chloride 101 96 - 106 mmol/L   CO2 21 20 - 29 mmol/L   Calcium 10.5 (H) 8.7 - 10.3 mg/dL   Total Protein 7.1 6.0 - 8.5 g/dL   Albumin 4.8 (H) 3.6 - 4.6 g/dL   Globulin, Total 2.3 1.5 - 4.5 g/dL   Albumin/Globulin Ratio 2.1 1.2 - 2.2   Bilirubin Total 0.6 0.0 - 1.2 mg/dL   Alkaline Phosphatase 52 39 - 117 IU/L   AST 20 0 - 40 IU/L   ALT 20 0 - 32 IU/L  CBC with Differential/Platelet  Result Value Ref Range   WBC 11.7 (H) 3.4 - 10.8 x10E3/uL   RBC 4.36 3.77 - 5.28 x10E6/uL   Hemoglobin 13.5 11.1 - 15.9 g/dL   Hematocrit 38.6 34.0 - 46.6 %   MCV 89 79 - 97 fL   MCH 31.0 26.6 - 33.0 pg   MCHC 35.0 31.5 - 35.7 g/dL   RDW 13.9 11.7 - 15.4 %   Platelets 266 150 - 450 x10E3/uL   Neutrophils 64 Not Estab. %   Lymphs 26 Not Estab. %   Monocytes 8 Not Estab. %   Eos 1 Not Estab. %   Basos 0 Not Estab. %   Neutrophils Absolute 7.4 (H) 1.4 - 7.0 x10E3/uL   Lymphocytes Absolute 3.1 0.7 - 3.1 x10E3/uL   Monocytes Absolute 0.9 0.1 - 0.9 x10E3/uL   EOS (ABSOLUTE) 0.1 0.0 - 0.4 x10E3/uL   Basophils Absolute 0.1 0.0 - 0.2 x10E3/uL   Immature Granulocytes 1 Not Estab. %   Immature Grans (Abs) 0.1 0.0 - 0.1 x10E3/uL  Lipid panel  Result Value Ref Range   Cholesterol, Total 181 100 - 199 mg/dL   Triglycerides 162 (H) 0 - 149 mg/dL   HDL 67 >39 mg/dL   VLDL Cholesterol Cal 32 5 - 40 mg/dL   LDL Calculated 82 0 - 99 mg/dL   Chol/HDL Ratio 2.7 0.0 - 4.4 ratio  TSH  Result Value Ref Range   TSH 4.830 (H) 0.450 - 4.500 uIU/mL      Assessment & Plan:   1. Essential hypertension - CMP14+EGFR - CBC with Differential/Platelet - Lipid panel - TSH  2. Depression, unspecified depression type  - escitalopram (LEXAPRO) 20 MG tablet; Take 1 tablet (20 mg total) by mouth daily.  Dispense: 90 tablet; Refill: 1 - buPROPion (WELLBUTRIN XL) 300 MG 24 hr tablet; Take 1 tablet (300 mg total) by mouth daily.  Dispense: 90 tablet; Refill: 1  3. Hypothyroidism, unspecified type - levothyroxine (SYNTHROID) 50 MCG tablet; TAKE 1 TABLET BY MOUTH DAILY BEFORE BREAKFAST  Dispense: 90 tablet; Refill: 1 - TSH  4. Osteoarthritis of lumbar spine, unspecified spinal osteoarthritis complication status - traMADol (ULTRAM) 50 MG tablet; Take 1 tablet (50 mg total) by mouth every 6 (six) hours as needed for up to 5 days.  Dispense: 20 tablet; Refill: 0 - predniSONE (STERAPRED UNI-PAK 21 TAB) 10 MG (21) TBPK tablet; As directed x 6 days  Dispense: 21 tablet; Refill: 0   Continue all other maintenance medications  as listed above.  Follow up plan: Recheck 6 months  Educational handout given for Maguayo PA-C [facility] [address]  [place],  [address] [phone]   11/06/2018, 12:59 PM

## 2018-11-08 ENCOUNTER — Other Ambulatory Visit: Payer: Self-pay | Admitting: *Deleted

## 2018-11-08 ENCOUNTER — Other Ambulatory Visit: Payer: Self-pay | Admitting: Physician Assistant

## 2018-11-08 DIAGNOSIS — R7989 Other specified abnormal findings of blood chemistry: Secondary | ICD-10-CM

## 2018-11-08 DIAGNOSIS — E039 Hypothyroidism, unspecified: Secondary | ICD-10-CM

## 2018-11-08 MED ORDER — LEVOTHYROXINE SODIUM 75 MCG PO TABS: ORAL_TABLET | ORAL | 1 refills | Status: DC

## 2018-11-09 ENCOUNTER — Encounter (INDEPENDENT_AMBULATORY_CARE_PROVIDER_SITE_OTHER): Payer: Self-pay

## 2018-11-27 ENCOUNTER — Ambulatory Visit: Payer: Medicare Other | Admitting: Physician Assistant

## 2018-11-29 ENCOUNTER — Other Ambulatory Visit: Payer: Self-pay | Admitting: Physician Assistant

## 2018-11-30 ENCOUNTER — Other Ambulatory Visit: Payer: Self-pay

## 2018-11-30 ENCOUNTER — Other Ambulatory Visit: Payer: Medicare Other

## 2018-11-30 DIAGNOSIS — R7989 Other specified abnormal findings of blood chemistry: Secondary | ICD-10-CM

## 2018-12-01 ENCOUNTER — Ambulatory Visit: Payer: Medicare Other | Admitting: Physician Assistant

## 2018-12-01 LAB — BMP8+EGFR
BUN/Creatinine Ratio: 20 (ref 12–28)
BUN: 22 mg/dL (ref 8–27)
CO2: 23 mmol/L (ref 20–29)
Calcium: 9.7 mg/dL (ref 8.7–10.3)
Chloride: 103 mmol/L (ref 96–106)
Creatinine, Ser: 1.11 mg/dL — ABNORMAL HIGH (ref 0.57–1.00)
GFR calc Af Amer: 54 mL/min/{1.73_m2} — ABNORMAL LOW (ref >59)
GFR calc non Af Amer: 47 mL/min/{1.73_m2} — ABNORMAL LOW (ref >59)
Glucose: 84 mg/dL (ref 65–99)
Potassium: 4.8 mmol/L (ref 3.5–5.2)
Sodium: 138 mmol/L (ref 134–144)

## 2018-12-01 LAB — MICROALBUMIN / CREATININE URINE RATIO
Creatinine, Urine: 57.1 mg/dL
Microalb/Creat Ratio: 10 mg/g creat (ref 0–29)
Microalbumin, Urine: 5.9 ug/mL

## 2018-12-06 ENCOUNTER — Telehealth: Payer: Self-pay | Admitting: Physician Assistant

## 2018-12-06 NOTE — Telephone Encounter (Signed)
LM with daughter in law and she will let us know.

## 2018-12-06 NOTE — Telephone Encounter (Signed)
There is not anything other than some pain medication. Has she ever taken any before?  We can use the weakest kind and have her use it on PRN. Suggestion codeine

## 2018-12-06 NOTE — Telephone Encounter (Signed)
Please advise 

## 2018-12-07 ENCOUNTER — Other Ambulatory Visit: Payer: Self-pay | Admitting: Physician Assistant

## 2018-12-07 MED ORDER — TRAMADOL HCL 50 MG PO TABS: 50.0000 mg | ORAL_TABLET | Freq: Four times a day (QID) | ORAL | 2 refills | Status: AC

## 2018-12-07 NOTE — Telephone Encounter (Signed)
sent 

## 2018-12-07 NOTE — Telephone Encounter (Signed)
Patient states she has used tramadol in the past

## 2018-12-08 NOTE — Telephone Encounter (Signed)
Patient aware.

## 2019-01-05 ENCOUNTER — Telehealth: Payer: Self-pay | Admitting: Physician Assistant

## 2019-01-05 NOTE — Telephone Encounter (Signed)
Apt scheduled.  

## 2019-01-11 ENCOUNTER — Other Ambulatory Visit: Payer: Self-pay

## 2019-01-12 ENCOUNTER — Ambulatory Visit (INDEPENDENT_AMBULATORY_CARE_PROVIDER_SITE_OTHER): Payer: Medicare Other | Admitting: Physician Assistant

## 2019-01-12 ENCOUNTER — Encounter: Payer: Self-pay | Admitting: Physician Assistant

## 2019-01-12 VITALS — BP 148/65 | HR 71 | Temp 97.8°F | Ht 67.0 in | Wt 198.0 lb

## 2019-01-12 DIAGNOSIS — M8580 Other specified disorders of bone density and structure, unspecified site: Secondary | ICD-10-CM

## 2019-01-12 DIAGNOSIS — M47816 Spondylosis without myelopathy or radiculopathy, lumbar region: Secondary | ICD-10-CM

## 2019-01-12 DIAGNOSIS — M479 Spondylosis, unspecified: Secondary | ICD-10-CM

## 2019-01-12 MED ORDER — METHYLPREDNISOLONE ACETATE 80 MG/ML IJ SUSP
80.0000 mg | Freq: Once | INTRAMUSCULAR | Status: AC
  Administered 2019-01-12: 80 mg via INTRAMUSCULAR

## 2019-01-12 MED ORDER — TIZANIDINE HCL 2 MG PO TABS: 2.0000 mg | ORAL_TABLET | Freq: Three times a day (TID) | ORAL | 0 refills | Status: DC | PRN

## 2019-01-12 MED ORDER — MELOXICAM 7.5 MG PO TABS: 7.5000 mg | ORAL_TABLET | Freq: Every day | ORAL | 0 refills | Status: DC

## 2019-01-12 MED ORDER — PREDNISONE 10 MG (21) PO TBPK: ORAL_TABLET | ORAL | 0 refills | Status: DC

## 2019-01-12 NOTE — Progress Notes (Signed)
BP (!) 148/65   Pulse 71   Temp 97.8 F (36.6 C) (Oral)   Ht 5' 7"  (1.702 m)   Wt 198 lb (89.8 kg)   BMI 31.01 kg/m    Subjective:    Patient ID: Brenda Solis, female    DOB: [birth year], 84 y.o.   MRN: [id number]  HPI: Brenda Solis is a 84 y.o. female presenting on 01/12/2019 for Back Pain  This patient comes in for recheck on her back pain.  She had a fall a few months ago and did have her hip x-rayed and her implant was in good place and no other abnormalities were seen.  However she continues to have significant lumbar pain.  It is bothering her when she stands for very long.  Because of some renal insufficiency we have had to stop any inflammatory.  She used to take this daily.  Her lab work has returned to baseline.  We will plan for her to go have an x-ray of her lumbar spine when she can.  The order is printed.  We will also have her use another round of prednisone.  And then we will have her also take about 10 days of Mobic 7.5 mg 1 daily.  If things do not improve she is to let us know.  Past Medical History:  Diagnosis Date  . Depression   . Heart murmur   . Hyperlipidemia   . Hypertension   . Hypothyroidism   . Insomnia, unspecified   . Spondylosis of unspecified site without mention of myelopathy   . Thyroid disease    hypothyroidism   Relevant past medical, surgical, family and social history reviewed and updated as indicated. Interim medical history since our last visit reviewed. Allergies and medications reviewed and updated. DATA REVIEWED: CHART IN EPIC  Family History reviewed for pertinent findings.  Review of Systems  Constitutional: Negative.   HENT: Negative.   Eyes: Negative.   Respiratory: Negative.   Gastrointestinal: Negative.   Genitourinary: Negative.   Musculoskeletal: Positive for arthralgias, back pain, gait problem and myalgias.    Allergies as of 01/12/2019      Reactions   Gabapentin Other (See Comments)   Dizzy   Oxycodone Itching       Medication List       Accurate as of January 12, 2019  2:52 PM. If you have any questions, ask your nurse or doctor.        ALPRAZolam 0.25 MG tablet Commonly known as: XANAX TAKE ONE TABLET BY MOUTH AT BEDTIME   buPROPion 300 MG 24 hr tablet Commonly known as: WELLBUTRIN XL Take 1 tablet (300 mg total) by mouth daily.   cholecalciferol 1000 units tablet Commonly known as: VITAMIN D Take 2,000 Units by mouth daily.   escitalopram 20 MG tablet Commonly known as: LEXAPRO Take 1 tablet (20 mg total) by mouth daily.   levothyroxine 75 MCG tablet Commonly known as: SYNTHROID TAKE 1 TABLET BY MOUTH DAILY BEFORE BREAKFAST   meclizine 25 MG tablet Commonly known as: ANTIVERT TAKE ONE TABLET BY MOUTH THREE TIMES DAILY AS NEEDED FOR DIZZINESS   meloxicam 7.5 MG tablet Commonly known as: MOBIC Take 1 tablet (7.5 mg total) by mouth daily. Started by: Terald Sleeper, PA-C   predniSONE 10 MG (21) Tbpk tablet Commonly known as: STERAPRED UNI-PAK 21 TAB As directed x 6 days   tiZANidine 2 MG tablet Commonly known as: ZANAFLEX Take 1 tablet (2 mg total) by mouth  every 8 (eight) hours as needed for muscle spasms. Started by: Terald Sleeper, PA-C   traZODone 50 MG tablet Commonly known as: DESYREL Take 1-2 tablets (50-100 mg total) by mouth at bedtime as needed for sleep.          Objective:    BP (!) 148/65   Pulse 71   Temp 97.8 F (36.6 C) (Oral)   Ht 5' 7"  (1.702 m)   Wt 198 lb (89.8 kg)   BMI 31.01 kg/m   Allergies  Allergen Reactions  . Gabapentin Other (See Comments)    Dizzy  . Oxycodone Itching    Wt Readings from Last 3 Encounters:  01/12/19 198 lb (89.8 kg)  11/03/18 199 lb (90.3 kg)  03/08/18 199 lb (90.3 kg)    Physical Exam Constitutional:      Appearance: She is well-developed.  HENT:     Head: Normocephalic and atraumatic.  Eyes:     Conjunctiva/sclera: Conjunctivae normal.     Pupils: Pupils are equal, round, and reactive to light.   Cardiovascular:     Rate and Rhythm: Normal rate and regular rhythm.     Heart sounds: Normal heart sounds.  Pulmonary:     Effort: Pulmonary effort is normal.  Musculoskeletal:     Lumbar back: She exhibits decreased range of motion, tenderness, pain and spasm.       Back:     Comments: History of surgery 2016   Skin:    General: Skin is warm and dry.     Findings: No rash.  Neurological:     Mental Status: She is alert and oriented to person, place, and time.     Deep Tendon Reflexes: Reflexes are normal and symmetric.  Psychiatric:        Behavior: Behavior normal.        Thought Content: Thought content normal.        Judgment: Judgment normal.     Results for orders placed or performed in visit on 11/30/18  [facility]  Result Value Ref Range   Glucose 84 65 - 99 mg/dL   BUN 22 8 - 27 mg/dL   Creatinine, Ser 1.11 (H) 0.57 - 1.00 mg/dL   GFR calc non Af Amer 47 (L) >59 mL/min/1.73   GFR calc Af Amer 54 (L) >59 mL/min/1.73   BUN/Creatinine Ratio 20 12 - 28   Sodium 138 134 - 144 mmol/L   Potassium 4.8 3.5 - 5.2 mmol/L   Chloride 103 96 - 106 mmol/L   CO2 23 20 - 29 mmol/L   Calcium 9.7 8.7 - 10.3 mg/dL  Microalbumin / creatinine urine ratio  Result Value Ref Range   Creatinine, Urine 57.1 Not Estab. mg/dL   Microalbumin, Urine 5.9 Not Estab. ug/mL   Microalb/Creat Ratio 10 0 - 29 mg/g creat      Assessment & Plan:   1. Osteoarthritis of spine, unspecified spinal osteoarthritis complication status, unspecified spinal region - DG Lumbar Spine 2-3 Views; Future - methylPREDNISolone acetate (DEPO-MEDROL) injection 80 mg  2. Osteoarthritis of lumbar spine, unspecified spinal osteoarthritis complication status - predniSONE (STERAPRED UNI-PAK 21 TAB) 10 MG (21) TBPK tablet; As directed x 6 days  Dispense: 21 tablet; Refill: 0 - methylPREDNISolone acetate (DEPO-MEDROL) injection 80 mg  3. Osteopenia, unspecified location - DG [facility] DEXA; Future   Continue all other  maintenance medications as listed above.  Follow up plan: No follow-ups on file.  Educational handout given for survey  Terald Sleeper  PA-C [facility] [address]  [place], [place] [address] [phone]   01/12/2019, 2:52 PM

## 2019-01-25 ENCOUNTER — Ambulatory Visit (INDEPENDENT_AMBULATORY_CARE_PROVIDER_SITE_OTHER): Payer: Medicare Other

## 2019-01-25 ENCOUNTER — Other Ambulatory Visit: Payer: Self-pay

## 2019-01-25 ENCOUNTER — Other Ambulatory Visit: Payer: Medicare Other

## 2019-01-25 DIAGNOSIS — M545 Low back pain: Secondary | ICD-10-CM | POA: Diagnosis not present

## 2019-01-25 DIAGNOSIS — M479 Spondylosis, unspecified: Secondary | ICD-10-CM | POA: Diagnosis not present

## 2019-02-04 ENCOUNTER — Other Ambulatory Visit: Payer: Self-pay | Admitting: Physician Assistant

## 2019-02-08 ENCOUNTER — Other Ambulatory Visit: Payer: Self-pay | Admitting: Physician Assistant

## 2019-02-21 ENCOUNTER — Other Ambulatory Visit: Payer: Self-pay | Admitting: Physician Assistant

## 2019-02-21 ENCOUNTER — Telehealth: Payer: Self-pay | Admitting: Physician Assistant

## 2019-02-21 DIAGNOSIS — M47816 Spondylosis without myelopathy or radiculopathy, lumbar region: Secondary | ICD-10-CM

## 2019-02-21 MED ORDER — PREDNISONE 10 MG (21) PO TBPK: ORAL_TABLET | ORAL | 0 refills | Status: DC

## 2019-02-21 NOTE — Telephone Encounter (Signed)
Sent prednisone pack

## 2019-02-22 NOTE — Telephone Encounter (Signed)
Patient aware.

## 2019-03-19 ENCOUNTER — Other Ambulatory Visit: Payer: Self-pay

## 2019-03-20 ENCOUNTER — Other Ambulatory Visit: Payer: Self-pay

## 2019-03-20 ENCOUNTER — Ambulatory Visit (INDEPENDENT_AMBULATORY_CARE_PROVIDER_SITE_OTHER): Payer: Medicare Other | Admitting: Physician Assistant

## 2019-03-20 ENCOUNTER — Encounter: Payer: Self-pay | Admitting: Physician Assistant

## 2019-03-20 VITALS — BP 138/72 | HR 72 | Temp 96.4°F | Ht 67.0 in | Wt 194.4 lb

## 2019-03-20 DIAGNOSIS — I10 Essential (primary) hypertension: Secondary | ICD-10-CM | POA: Diagnosis not present

## 2019-03-20 DIAGNOSIS — E039 Hypothyroidism, unspecified: Secondary | ICD-10-CM

## 2019-03-20 DIAGNOSIS — N289 Disorder of kidney and ureter, unspecified: Secondary | ICD-10-CM

## 2019-03-20 DIAGNOSIS — F329 Major depressive disorder, single episode, unspecified: Secondary | ICD-10-CM

## 2019-03-20 DIAGNOSIS — F32A Depression, unspecified: Secondary | ICD-10-CM

## 2019-03-20 MED ORDER — ESCITALOPRAM OXALATE 20 MG PO TABS: 20.0000 mg | ORAL_TABLET | Freq: Every day | ORAL | 1 refills | Status: DC

## 2019-03-20 MED ORDER — TRAZODONE HCL 50 MG PO TABS: 50.0000 mg | ORAL_TABLET | Freq: Every evening | ORAL | 1 refills | Status: DC | PRN

## 2019-03-20 MED ORDER — LEVOTHYROXINE SODIUM 75 MCG PO TABS: ORAL_TABLET | ORAL | 1 refills | Status: DC

## 2019-03-20 MED ORDER — BUPROPION HCL ER (XL) 300 MG PO TB24: 300.0000 mg | ORAL_TABLET | Freq: Every day | ORAL | 1 refills | Status: DC

## 2019-03-20 NOTE — Progress Notes (Signed)
BP 138/72 (BP Location: Left Arm, Cuff Size: Normal)   Pulse 72   Temp (!) 96.4 F (35.8 C) (Temporal)   Ht 5' 7"  (1.702 m)   Wt 194 lb 6.4 oz (88.2 kg)   SpO2 99%   BMI 30.45 kg/m    Subjective:    Patient ID: Brenda Solis, female    DOB: 1934/08/31, 84 y.o.   MRN: [id number]  HPI: Brenda Solis is a 84 y.o. female presenting on 03/20/2019 for Discuss Blood pressure readings  This patient comes in for a recheck on her blood pressure.  She brings in her readings.  Most of them are 130s and 140s over 60s.  There is one where it did go to 56 diastolically.  She is feeling very good and not having any difficulties.  I have reassured her that in the 2s is a very good number.  And I would like her to continue with the medications that she is doing.  When she came in today blood pressure was a little bit elevated which is normal for her when she comes into the doctor's office.  On the final recheck performed manually by me it was 138/72.  I reviewed all of her medications and she does need to be updated on several medications and we will have labs performed for the thyroid and kidney functions.  Past Medical History:  Diagnosis Date  . Depression   . Heart murmur   . Hyperlipidemia   . Hypertension   . Hypothyroidism   . Insomnia, unspecified   . Spondylosis of unspecified site without mention of myelopathy   . Thyroid disease    hypothyroidism   Relevant past medical, surgical, family and social history reviewed and updated as indicated. Interim medical history since our last visit reviewed. Allergies and medications reviewed and updated. DATA REVIEWED: CHART IN EPIC  Family History reviewed for pertinent findings.  Review of Systems  Constitutional: Negative.   HENT: Negative.   Eyes: Negative.   Respiratory: Negative.   Gastrointestinal: Negative.   Genitourinary: Negative.   Musculoskeletal: Positive for arthralgias and back pain.    Allergies as of 03/20/2019    Reactions   Gabapentin Other (See Comments)   Dizzy   Oxycodone Itching      Medication List       Accurate as of March 20, 2019 12:15 PM. If you have any questions, ask your nurse or doctor.        STOP taking these medications   meloxicam 7.5 MG tablet Commonly known as: MOBIC Stopped by: Terald Sleeper, PA-C   predniSONE 10 MG (21) Tbpk tablet Commonly known as: STERAPRED UNI-PAK 21 TAB Stopped by: Terald Sleeper, PA-C     TAKE these medications   ALPRAZolam 0.25 MG tablet Commonly known as: XANAX TAKE ONE TABLET BY MOUTH AT BEDTIME   buPROPion 300 MG 24 hr tablet Commonly known as: WELLBUTRIN XL Take 1 tablet (300 mg total) by mouth daily.   cholecalciferol 1000 units tablet Commonly known as: VITAMIN D Take 2,000 Units by mouth daily.   escitalopram 20 MG tablet Commonly known as: LEXAPRO Take 1 tablet (20 mg total) by mouth daily.   levothyroxine 75 MCG tablet Commonly known as: SYNTHROID TAKE 1 TABLET BY MOUTH DAILY BEFORE BREAKFAST   meclizine 25 MG tablet Commonly known as: ANTIVERT TAKE ONE TABLET BY MOUTH THREE TIMES DAILY AS NEEDED FOR DIZZINESS   tiZANidine 2 MG tablet Commonly known as: ZANAFLEX TAKE  1 TABLET BY MOUTH EVERY 8 HOURS AS NEEDED FOR MUSCLE SPASMS.   traZODone 50 MG tablet Commonly known as: DESYREL Take 1-2 tablets (50-100 mg total) by mouth at bedtime as needed for sleep.          Objective:    BP 138/72 (BP Location: Left Arm, Cuff Size: Normal)   Pulse 72   Temp (!) 96.4 F (35.8 C) (Temporal)   Ht 5' 7"  (1.702 m)   Wt 194 lb 6.4 oz (88.2 kg)   SpO2 99%   BMI 30.45 kg/m   Allergies  Allergen Reactions  . Gabapentin Other (See Comments)    Dizzy  . Oxycodone Itching    Wt Readings from Last 3 Encounters:  03/20/19 194 lb 6.4 oz (88.2 kg)  01/12/19 198 lb (89.8 kg)  11/03/18 199 lb (90.3 kg)    Physical Exam Constitutional:      General: She is not in acute distress.    Appearance: Normal appearance.  She is well-developed.  HENT:     Head: Normocephalic and atraumatic.  Cardiovascular:     Rate and Rhythm: Normal rate.  Pulmonary:     Effort: Pulmonary effort is normal.  Skin:    General: Skin is warm and dry.     Findings: No rash.  Neurological:     Mental Status: She is alert and oriented to person, place, and time.     Deep Tendon Reflexes: Reflexes are normal and symmetric.     Results for orders placed or performed in visit on 11/30/18  [facility]  Result Value Ref Range   Glucose 84 65 - 99 mg/dL   BUN 22 8 - 27 mg/dL   Creatinine, Ser 1.11 (H) 0.57 - 1.00 mg/dL   GFR calc non Af Amer 47 (L) >59 mL/min/1.73   GFR calc Af Amer 54 (L) >59 mL/min/1.73   BUN/Creatinine Ratio 20 12 - 28   Sodium 138 134 - 144 mmol/L   Potassium 4.8 3.5 - 5.2 mmol/L   Chloride 103 96 - 106 mmol/L   CO2 23 20 - 29 mmol/L   Calcium 9.7 8.7 - 10.3 mg/dL  Microalbumin / creatinine urine ratio  Result Value Ref Range   Creatinine, Urine 57.1 Not Estab. mg/dL   Microalbumin, Urine 5.9 Not Estab. ug/mL   Microalb/Creat Ratio 10 0 - 29 mg/g creat      Assessment & Plan:   1. Essential hypertension Continue medications - CMP14+EGFR  2. Renal insufficiency Lab work will be followed - CMP14+EGFR Continue avoidance of all NSAIDs  3. Hypothyroidism, unspecified type - levothyroxine (SYNTHROID) 75 MCG tablet; TAKE 1 TABLET BY MOUTH DAILY BEFORE BREAKFAST  Dispense: 90 tablet; Refill: 1 - TSH  4. Depression, unspecified depression type - buPROPion (WELLBUTRIN XL) 300 MG 24 hr tablet; Take 1 tablet (300 mg total) by mouth daily.  Dispense: 90 tablet; Refill: 1 - escitalopram (LEXAPRO) 20 MG tablet; Take 1 tablet (20 mg total) by mouth daily.  Dispense: 90 tablet; Refill: 1     Continue all other maintenance medications as listed above.  Follow up plan: Return in about 6 months (around 09/17/2019).  Educational handout given for [place] PA-C [facility] [address]  [place], [place] [address] [phone]   03/20/2019, 12:15 PM

## 2019-03-21 LAB — CMP14+EGFR
ALT: 20 IU/L (ref 0–32)
AST: 25 IU/L (ref 0–40)
Albumin/Globulin Ratio: 1.8 (ref 1.2–2.2)
Albumin: 4.2 g/dL (ref 3.6–4.6)
Alkaline Phosphatase: 61 IU/L (ref 39–117)
BUN/Creatinine Ratio: 14 (ref 12–28)
BUN: 15 mg/dL (ref 8–27)
Bilirubin Total: 0.4 mg/dL (ref 0.0–1.2)
CO2: 18 mmol/L — ABNORMAL LOW (ref 20–29)
Calcium: 9.9 mg/dL (ref 8.7–10.3)
Chloride: 104 mmol/L (ref 96–106)
Creatinine, Ser: 1.08 mg/dL — ABNORMAL HIGH (ref 0.57–1.00)
GFR calc Af Amer: 55 mL/min/{1.73_m2} — ABNORMAL LOW (ref >59)
GFR calc non Af Amer: 48 mL/min/{1.73_m2} — ABNORMAL LOW (ref >59)
Globulin, Total: 2.3 g/dL (ref 1.5–4.5)
Glucose: 82 mg/dL (ref 65–99)
Potassium: 5.2 mmol/L (ref 3.5–5.2)
Sodium: 138 mmol/L (ref 134–144)
Total Protein: 6.5 g/dL (ref 6.0–8.5)

## 2019-03-21 LAB — TSH: TSH: 3.03 u[IU]/mL (ref 0.450–4.500)

## 2019-03-29 ENCOUNTER — Encounter: Payer: Self-pay | Admitting: Family Medicine

## 2019-03-29 ENCOUNTER — Other Ambulatory Visit: Payer: Self-pay

## 2019-03-29 ENCOUNTER — Ambulatory Visit (INDEPENDENT_AMBULATORY_CARE_PROVIDER_SITE_OTHER): Payer: Medicare Other | Admitting: Family Medicine

## 2019-03-29 DIAGNOSIS — J209 Acute bronchitis, unspecified: Secondary | ICD-10-CM

## 2019-03-29 MED ORDER — PREDNISONE 20 MG PO TABS: ORAL_TABLET | ORAL | 0 refills | Status: DC

## 2019-03-29 MED ORDER — AZITHROMYCIN 250 MG PO TABS: ORAL_TABLET | ORAL | 0 refills | Status: DC

## 2019-03-29 MED ORDER — PROAIR RESPICLICK 108 (90 BASE) MCG/ACT IN AEPB: 1.0000 | INHALATION_SPRAY | Freq: Four times a day (QID) | RESPIRATORY_TRACT | 3 refills | Status: DC | PRN

## 2019-03-29 MED ORDER — BENZONATATE 100 MG PO CAPS: 100.0000 mg | ORAL_CAPSULE | Freq: Three times a day (TID) | ORAL | 0 refills | Status: DC | PRN

## 2019-03-29 NOTE — Progress Notes (Signed)
Virtual Visit via telephone Note Due to COVID-19 pandemic this visit was conducted virtually. This visit type was conducted due to national recommendations for restrictions regarding the COVID-19 Pandemic (e.g. social distancing, sheltering in place) in an effort to limit this patient's exposure and mitigate transmission in our community. All issues noted in this document were discussed and addressed.  A physical exam was not performed with this format.   I connected with Brenda Solis on 03/29/19 at 1345 by telephone and verified that I am speaking with the correct person using two identifiers. Brenda Solis is currently located at home and family is currently with them during visit. The provider, Monia Pouch, FNP is located in their office at time of visit.  I discussed the limitations, risks, security and privacy concerns of performing an evaluation and management service by telephone and the availability of in person appointments. I also discussed with the patient that there may be a patient responsible charge related to this service. The patient expressed understanding and agreed to proceed.  Subjective:  Patient ID: Brenda Solis, female    DOB: 11-24-1934, 84 y.o.   MRN: [id number]  Chief Complaint:  Bronchitis   HPI: Brenda Solis is a 84 y.o. female presenting on 03/29/2019 for Bronchitis   Pt reports cough, congestion, wheezing, and slight sputum production. States this started several days ago, states the cough goes from dry to productive at times. No fever. Does report chills and slight malaise with voice change. Does report tightness in chest with deep breathing. No chest pain, palpitations, shortness of breath, or confusion. No weakness or headaches.   Cough This is a recurrent problem. The current episode started 1 to 4 weeks ago. The problem has been waxing and waning. The problem occurs every few minutes. The cough is non-productive (and dry at times). Associated symptoms  include chills and wheezing. Pertinent negatives include no chest pain, ear congestion, ear pain, fever, headaches, heartburn, hemoptysis, myalgias, nasal congestion, postnasal drip, rash, rhinorrhea, sore throat, shortness of breath, sweats or weight loss. The symptoms are aggravated by exercise and lying down. She has tried OTC cough suppressant for the symptoms. The treatment provided no relief.     Relevant past medical, surgical, family, and social history reviewed and updated as indicated.  Allergies and medications reviewed and updated.   Past Medical History:  Diagnosis Date  . Depression   . Heart murmur   . Hyperlipidemia   . Hypertension   . Hypothyroidism   . Insomnia, unspecified   . Spondylosis of unspecified site without mention of myelopathy   . Thyroid disease    hypothyroidism    Past Surgical History:  Procedure Laterality Date  . APPENDECTOMY    . CATARACT EXTRACTION, BILATERAL Bilateral 2006  . [place]  [place]  . CHOLECYSTECTOMY  1990  . DECOMPRESSIVE LUMBAR LAMINECTOMY LEVEL 1 N/A 02/01/2013   Procedure: CENTRAL DECOMPRESSIVE LUMBAR LAMINECTOMY LEVEL 1 L4-L5;  Surgeon: Tobi Bastos, MD;  Location: [facility];  Service: Orthopedics;  Laterality: N/A;  . DECOMPRESSIVE LUMBAR LAMINECTOMY LEVEL 1 N/A 11/20/2014   Procedure: REVISION L4-S1 DECOMPRESSION, L4-L5 IN SITU FUSION (LEVEL 1);  Surgeon: Melina Schools, MD;  Location: [facility];  Service: Orthopedics;  Laterality: N/A;  . KNEE SURGERY Right 2009   meniscus repair  . TOTAL HIP ARTHROPLASTY  2011   RIGHT    Social History   Socioeconomic History  . Marital status:  Widowed    Spouse name: Not on file  . Number of children: 3  . Years of education: Not on file  . Highest education level: Not on file  Occupational History    Employer: RETIRED  Social Needs  . Financial resource strain: Not hard at all  . Food insecurity    Worry: Never true    Inability:  Never true  . Transportation needs    Medical: No    Non-medical: No  Tobacco Use  . Smoking status: Never Smoker  . Smokeless tobacco: Never Used  Substance and Sexual Activity  . Alcohol use: No    Alcohol/week: 0.0 standard drinks  . Drug use: No  . Sexual activity: Not Currently  Lifestyle  . Physical activity    Days per week: 2 days    Minutes per session: 60 min  . Stress: Only a little  Relationships  . Social connections    Talks on phone: More than three times a week    Gets together: More than three times a week    Attends religious service: Not on file    Active member of club or organization: Yes    Attends meetings of clubs or organizations: More than 4 times per year    Relationship status: Widowed  . Intimate partner violence    Fear of current or ex partner: No    Emotionally abused: No    Physically abused: No    Forced sexual activity: No  Other Topics Concern  . Not on file  Social History Narrative  . Not on file    Outpatient Encounter Medications as of 03/29/2019  Medication Sig  . Albuterol Sulfate (PROAIR RESPICLICK) 123XX123 (90 Base) MCG/ACT AEPB Inhale 1-2 puffs into the lungs every 6 (six) hours as needed (cough, wheezing). prn  . ALPRAZolam (XANAX) 0.25 MG tablet TAKE ONE TABLET BY MOUTH AT BEDTIME  . azithromycin (ZITHROMAX Z-PAK) 250 MG tablet As directed  . benzonatate (TESSALON PERLES) 100 MG capsule Take 1 capsule (100 mg total) by mouth 3 (three) times daily as needed for cough.  Marland Kitchen buPROPion (WELLBUTRIN XL) 300 MG 24 hr tablet Take 1 tablet (300 mg total) by mouth daily.  . cholecalciferol (VITAMIN D) 1000 UNITS tablet Take 2,000 Units by mouth daily.    Marland Kitchen escitalopram (LEXAPRO) 20 MG tablet Take 1 tablet (20 mg total) by mouth daily.  Marland Kitchen levothyroxine (SYNTHROID) 75 MCG tablet TAKE 1 TABLET BY MOUTH DAILY BEFORE BREAKFAST  . meclizine (ANTIVERT) 25 MG tablet TAKE ONE TABLET BY MOUTH THREE TIMES DAILY AS NEEDED FOR DIZZINESS  . predniSONE  (DELTASONE) 20 MG tablet 2 po at sametime daily for 5 days  . tiZANidine (ZANAFLEX) 2 MG tablet TAKE 1 TABLET BY MOUTH EVERY 8 HOURS AS NEEDED FOR MUSCLE SPASMS.  Marland Kitchen traZODone (DESYREL) 50 MG tablet Take 1-2 tablets (50-100 mg total) by mouth at bedtime as needed for sleep.   No facility-administered encounter medications on file as of 03/29/2019.     Allergies  Allergen Reactions  . Gabapentin Other (See Comments)    Dizzy  . Oxycodone Itching    Review of Systems  Constitutional: Positive for activity change and chills. Negative for appetite change, diaphoresis, fatigue, fever, unexpected weight change and weight loss.  HENT: Positive for voice change. Negative for congestion, dental problem, drooling, ear discharge, ear pain, facial swelling, hearing loss, mouth sores, nosebleeds, postnasal drip, rhinorrhea, sinus pressure, sinus pain, sneezing, sore throat, tinnitus and trouble swallowing.  Eyes: Negative.   Respiratory: Positive for cough and wheezing. Negative for hemoptysis, chest tightness and shortness of breath.   Cardiovascular: Negative for chest pain, palpitations and leg swelling.  Gastrointestinal: Negative for abdominal pain, blood in stool, constipation, diarrhea, heartburn, nausea and vomiting.  Endocrine: Negative.   Genitourinary: Negative for decreased urine volume, difficulty urinating, dysuria, frequency and urgency.  Musculoskeletal: Negative for arthralgias and myalgias.  Skin: Negative.  Negative for color change, pallor and rash.  Allergic/Immunologic: Negative.   Neurological: Negative for dizziness, weakness and headaches.  Hematological: Negative.   Psychiatric/Behavioral: Negative for confusion, hallucinations, sleep disturbance and suicidal ideas.  All other systems reviewed and are negative.        Observations/Objective: No vital signs or physical exam, this was a telephone or virtual health encounter.  Pt alert and oriented, answers all questions  appropriately, and able to speak in full sentences.    Assessment and Plan: Kiyan was seen today for bronchitis.  Diagnoses and all orders for this visit:  Bronchitis, acute, with bronchospasm Reported symptoms consistent with acute bronchitis with bronchospasm. Will treat with below. Pt aware of symptomatic care. Report any new, worsening, or persistent symptoms.  -     predniSONE (DELTASONE) 20 MG tablet; 2 po at sametime daily for 5 days -     azithromycin (ZITHROMAX Z-PAK) 250 MG tablet; As directed -     benzonatate (TESSALON PERLES) 100 MG capsule; Take 1 capsule (100 mg total) by mouth 3 (three) times daily as needed for cough. -     Albuterol Sulfate (PROAIR RESPICLICK) 123XX123 (90 Base) MCG/ACT AEPB; Inhale 1-2 puffs into the lungs every 6 (six) hours as needed (cough, wheezing). prn     Follow Up Instructions: Return if symptoms worsen or fail to improve.    I discussed the assessment and treatment plan with the patient. The patient was provided an opportunity to ask questions and all were answered. The patient agreed with the plan and demonstrated an understanding of the instructions.   The patient was advised to call back or seek an in-person evaluation if the symptoms worsen or if the condition fails to improve as anticipated.  The above assessment and management plan was discussed with the patient. The patient verbalized understanding of and has agreed to the management plan. Patient is aware to call the clinic if they develop any new symptoms or if symptoms persist or worsen. Patient is aware when to return to the clinic for a follow-up visit. Patient educated on when it is appropriate to go to the emergency department.    I provided 15 minutes of non-face-to-face time during this encounter. The call started at 1345. The call ended at 1400. The other time was used for coordination of care.    Monia Pouch, FNP-C [facility] [address]  [place], [place] [address] [phone] 03/29/19

## 2019-03-30 ENCOUNTER — Other Ambulatory Visit: Payer: Self-pay

## 2019-03-30 DIAGNOSIS — Z20822 Contact with and (suspected) exposure to covid-19: Secondary | ICD-10-CM

## 2019-03-31 LAB — NOVEL CORONAVIRUS, NAA: SARS-CoV-2, NAA: DETECTED — AB

## 2019-04-05 ENCOUNTER — Other Ambulatory Visit: Payer: Self-pay | Admitting: Physician Assistant

## 2019-04-05 ENCOUNTER — Telehealth: Payer: Self-pay | Admitting: Physician Assistant

## 2019-04-05 NOTE — Telephone Encounter (Signed)
Patient aware of recommendations.  

## 2019-04-05 NOTE — Telephone Encounter (Signed)
Continue to monitor both her oxygen and her temperatures, she can use Mucinex for the loose cough and monitor for any worsening's, if she develops any shortness of breath or wheezing or anything worsens at all give [place] a call or go back to the emergency department, it sounds like possibly the improving so just keep an eye on it.

## 2019-04-11 ENCOUNTER — Other Ambulatory Visit: Payer: Self-pay

## 2019-04-11 DIAGNOSIS — Z20822 Contact with and (suspected) exposure to covid-19: Secondary | ICD-10-CM

## 2019-04-12 LAB — NOVEL CORONAVIRUS, NAA: SARS-CoV-2, NAA: NOT DETECTED

## 2019-04-18 ENCOUNTER — Ambulatory Visit: Payer: Medicare Other | Admitting: Family Medicine

## 2019-05-03 ENCOUNTER — Other Ambulatory Visit: Payer: Self-pay

## 2019-05-04 ENCOUNTER — Ambulatory Visit (INDEPENDENT_AMBULATORY_CARE_PROVIDER_SITE_OTHER): Payer: Medicare Other | Admitting: Physician Assistant

## 2019-05-04 ENCOUNTER — Encounter: Payer: Self-pay | Admitting: Physician Assistant

## 2019-05-04 VITALS — BP 149/68 | HR 73 | Temp 97.0°F | Ht 67.0 in | Wt 197.0 lb

## 2019-05-04 DIAGNOSIS — I10 Essential (primary) hypertension: Secondary | ICD-10-CM | POA: Diagnosis not present

## 2019-05-04 DIAGNOSIS — F3341 Major depressive disorder, recurrent, in partial remission: Secondary | ICD-10-CM | POA: Diagnosis not present

## 2019-05-04 DIAGNOSIS — F419 Anxiety disorder, unspecified: Secondary | ICD-10-CM

## 2019-05-04 DIAGNOSIS — Z23 Encounter for immunization: Secondary | ICD-10-CM | POA: Diagnosis not present

## 2019-05-04 DIAGNOSIS — E039 Hypothyroidism, unspecified: Secondary | ICD-10-CM | POA: Diagnosis not present

## 2019-05-04 DIAGNOSIS — M47816 Spondylosis without myelopathy or radiculopathy, lumbar region: Secondary | ICD-10-CM

## 2019-05-04 MED ORDER — ALPRAZOLAM 0.25 MG PO TABS: 0.2500 mg | ORAL_TABLET | Freq: Every day | ORAL | 1 refills | Status: DC

## 2019-05-04 MED ORDER — TRAMADOL HCL 50 MG PO TABS: 50.0000 mg | ORAL_TABLET | Freq: Three times a day (TID) | ORAL | 2 refills | Status: DC | PRN

## 2019-05-06 ENCOUNTER — Encounter: Payer: Self-pay | Admitting: Physician Assistant

## 2019-05-06 NOTE — Progress Notes (Signed)
BP (!) 149/68   Pulse 73   Temp (!) 97 F (36.1 C) (Temporal)   Ht _0  (1.702 m)   Wt 197 lb (89.4 kg)   SpO2 98%   BMI 30.85 kg/m    Subjective:    Patient ID: Brenda Solis, female    DOB: [birth year], 84 y.o.   MRN: [id number]  HPI: Brenda Solis is a 84 y.o. female presenting on 05/04/2019 for Hypertension (6 month) and Hypothyroidism    Past Medical History:  Diagnosis Date  . Depression   . Heart murmur   . Hyperlipidemia   . Hypertension   . Hypothyroidism   . Insomnia, unspecified   . Spondylosis of unspecified site without mention of myelopathy   . Thyroid disease    hypothyroidism   Relevant past medical, surgical, family and social history reviewed and updated as indicated. Interim medical history since our last visit reviewed. Allergies and medications reviewed and updated. DATA REVIEWED: CHART IN EPIC  Family History reviewed for pertinent findings.  Review of Systems  Constitutional: Negative.  Negative for activity change, fatigue and fever.  HENT: Negative.   Eyes: Negative.   Respiratory: Negative.  Negative for cough.   Cardiovascular: Negative.  Negative for chest pain.  Gastrointestinal: Negative.  Negative for abdominal pain.  Endocrine: Negative.   Genitourinary: Negative.  Negative for dysuria.  Musculoskeletal: Positive for arthralgias and back pain.  Skin: Negative.   Neurological: Negative.   Psychiatric/Behavioral: The patient is nervous/anxious.     Allergies as of 05/04/2019      Reactions   Gabapentin Other (See Comments)   Dizzy   Oxycodone Itching      Medication List       Accurate as of May 04, 2019 11:59 PM. If you have any questions, ask your nurse or doctor.        STOP taking these medications   azithromycin 250 MG tablet Commonly known as: Zithromax Z-Pak Stopped by: Terald Sleeper, PA-C   predniSONE 20 MG tablet Commonly known as: Deltasone Stopped by: Terald Sleeper, PA-C     TAKE these medications    ALPRAZolam 0.25 MG tablet Commonly known as: XANAX Take 1 tablet (0.25 mg total) by mouth at bedtime.   benzonatate 100 MG capsule Commonly known as: Tessalon Perles Take 1 capsule (100 mg total) by mouth 3 (three) times daily as needed for cough.   buPROPion 300 MG 24 hr tablet Commonly known as: WELLBUTRIN XL Take 1 tablet (300 mg total) by mouth daily.   cholecalciferol 1000 units tablet Commonly known as: VITAMIN D Take 2,000 Units by mouth daily.   escitalopram 20 MG tablet Commonly known as: LEXAPRO Take 1 tablet (20 mg total) by mouth daily.   levothyroxine 75 MCG tablet Commonly known as: SYNTHROID TAKE 1 TABLET BY MOUTH DAILY BEFORE BREAKFAST   meclizine 25 MG tablet Commonly known as: ANTIVERT TAKE ONE TABLET BY MOUTH THREE TIMES DAILY AS NEEDED FOR DIZZINESS   ProAir RespiClick 287 (90 Base) MCG/ACT Aepb Generic drug: Albuterol Sulfate Inhale 1-2 puffs into the lungs every 6 (six) hours as needed (cough, wheezing). prn   tiZANidine 2 MG tablet Commonly known as: ZANAFLEX TAKE 1 TABLET BY MOUTH EVERY 8 HOURS AS NEEDED FOR MUSCLE SPASMS.   traMADol 50 MG tablet Commonly known as: ULTRAM Take 1 tablet (50 mg total) by mouth every 8 (eight) hours as needed. Started by: Terald Sleeper, PA-C   traZODone 50 MG tablet  Commonly known as: DESYREL Take 1-2 tablets (50-100 mg total) by mouth at bedtime as needed for sleep.          Objective:    BP (!) 149/68   Pulse 73   Temp (!) 97 F (36.1 C) (Temporal)   Ht _0  (1.702 m)   Wt 197 lb (89.4 kg)   SpO2 98%   BMI 30.85 kg/m   Allergies  Allergen Reactions  . Gabapentin Other (See Comments)    Dizzy  . Oxycodone Itching    Wt Readings from Last 3 Encounters:  05/04/19 197 lb (89.4 kg)  03/20/19 194 lb 6.4 oz (88.2 kg)  01/12/19 198 lb (89.8 kg)    Physical Exam Constitutional:      General: She is not in acute distress.    Appearance: Normal appearance. She is well-developed.  HENT:      Head: Normocephalic and atraumatic.  Cardiovascular:     Rate and Rhythm: Normal rate.  Pulmonary:     Effort: Pulmonary effort is normal.  Skin:    General: Skin is warm and dry.     Findings: No rash.  Neurological:     Mental Status: She is alert and oriented to person, place, and time.     Deep Tendon Reflexes: Reflexes are normal and symmetric.     Results for orders placed or performed in visit on 04/11/19  Novel Coronavirus, NAA ([facility])   Specimen: Nasopharyngeal(NP) swabs in vial transport medium   NASOPHARYNGE  TESTING  Result Value Ref Range   SARS-CoV-2, NAA Not Detected Not Detected      Assessment & Plan:   1. Need for immunization against influenza - Flu Vaccine QUAD High Dose(Fluad)  2. Essential hypertension - CBC with Differential/Platelet; Future - CMP14+EGFR; Future - Lipid Panel; Future - TSH; Future  3. Hypothyroidism, unspecified type - CBC with Differential/Platelet; Future - CMP14+EGFR; Future - TSH; Future  4. Recurrent major depressive disorder, in partial remission ([place])  5. Anxiety - ALPRAZolam (XANAX) 0.25 MG tablet; Take 1 tablet (0.25 mg total) by mouth at bedtime.  Dispense: 30 tablet; Refill: 1  6. Osteoarthritis of lumbar spine, unspecified spinal osteoarthritis complication status - traMADol (ULTRAM) 50 MG tablet; Take 1 tablet (50 mg total) by mouth every 8 (eight) hours as needed.  Dispense: 30 tablet; Refill: 2   Continue all other maintenance medications as listed above.  Follow up plan: Recheck 6 months  Educational handout given for  [place] PA-C [facility] [address]  [place], [place] [address] [phone]   05/06/2019, 2:59 PM

## 2019-05-10 ENCOUNTER — Ambulatory Visit: Payer: Medicare Other

## 2019-07-16 ENCOUNTER — Encounter: Payer: Self-pay | Admitting: Physician Assistant

## 2019-07-16 ENCOUNTER — Ambulatory Visit (INDEPENDENT_AMBULATORY_CARE_PROVIDER_SITE_OTHER): Payer: Medicare Other | Admitting: Physician Assistant

## 2019-07-16 ENCOUNTER — Other Ambulatory Visit: Payer: Self-pay

## 2019-07-16 VITALS — BP 120/53 | HR 69 | Temp 98.4°F | Resp 20 | Ht 67.0 in | Wt 194.0 lb

## 2019-07-16 DIAGNOSIS — M47816 Spondylosis without myelopathy or radiculopathy, lumbar region: Secondary | ICD-10-CM

## 2019-07-16 DIAGNOSIS — M48062 Spinal stenosis, lumbar region with neurogenic claudication: Secondary | ICD-10-CM

## 2019-07-16 DIAGNOSIS — M479 Spondylosis, unspecified: Secondary | ICD-10-CM

## 2019-07-16 MED ORDER — DULOXETINE HCL 20 MG PO CPEP: 20.0000 mg | ORAL_CAPSULE | Freq: Every day | ORAL | 3 refills | Status: DC

## 2019-07-16 MED ORDER — TRAMADOL HCL 50 MG PO TABS: 50.0000 mg | ORAL_TABLET | Freq: Three times a day (TID) | ORAL | 1 refills | Status: DC | PRN

## 2019-07-16 MED ORDER — PREDNISONE 10 MG (48) PO TBPK: ORAL_TABLET | ORAL | 0 refills | Status: DC

## 2019-07-16 MED ORDER — TIZANIDINE HCL 2 MG PO CAPS: 2.0000 mg | ORAL_CAPSULE | Freq: Three times a day (TID) | ORAL | Status: DC

## 2019-07-16 MED ORDER — METHYLPREDNISOLONE ACETATE 80 MG/ML IJ SUSP
80.0000 mg | Freq: Once | INTRAMUSCULAR | Status: AC
  Administered 2019-07-16: 14:00:00 80 mg via INTRAMUSCULAR

## 2019-07-19 NOTE — Progress Notes (Signed)
Acute Office Visit  Subjective:    Patient ID: Brenda Solis, female    DOB: October 16, 1934, 85 y.o.   MRN: [id number]  Chief Complaint  Patient presents with  . bilateral leg pain  . Back Pain    Back Pain This is a chronic problem. The current episode started more than 1 year ago. The problem occurs constantly. The problem has been gradually worsening since onset. The pain is present in the lumbar spine and sacro-iliac. The pain radiates to the left knee and right knee. The pain is at a severity of 8/10. The pain is the same all the time. Risk factors include history of osteoporosis and menopause. She has tried analgesics, muscle relaxant and heat for the symptoms. The treatment provided mild relief.     Past Medical History:  Diagnosis Date  . Depression   . Heart murmur   . Hyperlipidemia   . Hypertension   . Hypothyroidism   . Insomnia, unspecified   . Spondylosis of unspecified site without mention of myelopathy   . Thyroid disease    hypothyroidism    Past Surgical History:  Procedure Laterality Date  . APPENDECTOMY    . CATARACT EXTRACTION, BILATERAL Bilateral 2006  . [place]  . [place]  . CHOLECYSTECTOMY  1990  . DECOMPRESSIVE LUMBAR LAMINECTOMY LEVEL 1 N/A 02/01/2013   Procedure: CENTRAL DECOMPRESSIVE LUMBAR LAMINECTOMY LEVEL 1 L4-L5;  Surgeon: Tobi Bastos, MD;  Location: [facility];  Service: Orthopedics;  Laterality: N/A;  . DECOMPRESSIVE LUMBAR LAMINECTOMY LEVEL 1 N/A 11/20/2014   Procedure: REVISION L4-S1 DECOMPRESSION, L4-L5 IN SITU FUSION (LEVEL 1);  Surgeon: Melina Schools, MD;  Location: [facility];  Service: Orthopedics;  Laterality: N/A;  . KNEE SURGERY Right 2009   meniscus repair  . TOTAL HIP ARTHROPLASTY  2011   RIGHT    Family History  Problem Relation Age of Onset  . Coronary artery disease Brother        81s  . Heart disease Brother   . Cancer Mother        breast  . Osteoporosis Mother   . Heart  disease Mother   . Osteoporosis Maternal Aunt   . Heart disease Father   . Stroke Father   . Esophageal varices Brother   . Stroke Other        FAMILY  . Esophageal varices Other        FAMILY    Social History   Socioeconomic History  . Marital status: Widowed    Spouse name: Not on file  . Number of children: 3  . Years of education: Not on file  . Highest education level: Not on file  Occupational History    Employer: RETIRED  Tobacco Use  . Smoking status: Never Smoker  . Smokeless tobacco: Never Used  Substance and Sexual Activity  . Alcohol use: No    Alcohol/week: 0.0 standard drinks  . Drug use: No  . Sexual activity: Not Currently  Other Topics Concern  . Not on file  Social History Narrative  . Not on file   Social Determinants of Health   Financial Resource Strain:   . Difficulty of Paying Living Expenses: Not on file  Food Insecurity:   . Worried About Charity fundraiser in the Last Year: Not on file  . Ran Out of Food in the Last Year: Not on file  Transportation Needs:   . Lack of Transportation (  Medical): Not on file  . Lack of Transportation (Non-Medical): Not on file  Physical Activity:   . Days of Exercise per Week: Not on file  . Minutes of Exercise per Session: Not on file  Stress:   . Feeling of Stress : Not on file  Social Connections:   . Frequency of Communication with Friends and Family: Not on file  . Frequency of Social Gatherings with Friends and Family: Not on file  . Attends Religious Services: Not on file  . Active Member of Clubs or Organizations: Not on file  . Attends Archivist Meetings: Not on file  . Marital Status: Not on file  Intimate Partner Violence:   . Fear of Current or Ex-Partner: Not on file  . Emotionally Abused: Not on file  . Physically Abused: Not on file  . Sexually Abused: Not on file    Outpatient Medications Prior to Visit  Medication Sig Dispense Refill  . ALPRAZolam (XANAX) 0.25 MG  tablet Take 1 tablet (0.25 mg total) by mouth at bedtime. 30 tablet 1  . buPROPion (WELLBUTRIN XL) 300 MG 24 hr tablet Take 1 tablet (300 mg total) by mouth daily. 90 tablet 1  . cholecalciferol (VITAMIN D) 1000 UNITS tablet Take 2,000 Units by mouth daily.      Marland Kitchen escitalopram (LEXAPRO) 20 MG tablet Take 1 tablet (20 mg total) by mouth daily. 90 tablet 1  . levothyroxine (SYNTHROID) 75 MCG tablet TAKE 1 TABLET BY MOUTH DAILY BEFORE BREAKFAST 90 tablet 1  . meclizine (ANTIVERT) 25 MG tablet TAKE ONE TABLET BY MOUTH THREE TIMES DAILY AS NEEDED FOR DIZZINESS 30 tablet 2  . traZODone (DESYREL) 50 MG tablet Take 1-2 tablets (50-100 mg total) by mouth at bedtime as needed for sleep. 180 tablet 1  . traMADol (ULTRAM) 50 MG tablet Take 1 tablet (50 mg total) by mouth every 8 (eight) hours as needed. 30 tablet 2  . Albuterol Sulfate (PROAIR RESPICLICK) 123XX123 (90 Base) MCG/ACT AEPB Inhale 1-2 puffs into the lungs every 6 (six) hours as needed (cough, wheezing). prn 1 each 3  . benzonatate (TESSALON PERLES) 100 MG capsule Take 1 capsule (100 mg total) by mouth 3 (three) times daily as needed for cough. 20 capsule 0  . tiZANidine (ZANAFLEX) 2 MG tablet TAKE 1 TABLET BY MOUTH EVERY 8 HOURS AS NEEDED FOR MUSCLE SPASMS. 150 tablet 0   No facility-administered medications prior to visit.    Allergies  Allergen Reactions  . Gabapentin Other (See Comments)    Dizzy  . Oxycodone Itching    Review of Systems  Constitutional: Negative.   HENT: Negative.   Eyes: Negative.   Respiratory: Negative.   Gastrointestinal: Negative.   Genitourinary: Negative.   Musculoskeletal: Positive for arthralgias, back pain and myalgias.       Objective:    Physical Exam Constitutional:      General: She is not in acute distress.    Appearance: Normal appearance. She is well-developed.  HENT:     Head: Normocephalic and atraumatic.  Cardiovascular:     Rate and Rhythm: Normal rate.  Pulmonary:     Effort:  Pulmonary effort is normal.  Musculoskeletal:     Lumbar back: Spasms, tenderness and bony tenderness present. Decreased range of motion.       Back:  Skin:    General: Skin is warm and dry.     Findings: No rash.  Neurological:     Mental Status: She is alert and  oriented to person, place, and time.     Deep Tendon Reflexes: Reflexes are normal and symmetric.     BP (!) 120/53   Pulse 69   Temp 98.4 F (36.9 C) (Temporal)   Resp 20   Ht 5\' 7"  (1.702 m)   Wt 194 lb (88 kg)   SpO2 100%   BMI 30.38 kg/m  Wt Readings from Last 3 Encounters:  07/16/19 194 lb (88 kg)  05/04/19 197 lb (89.4 kg)  03/20/19 194 lb 6.4 oz (88.2 kg)    There are no preventive care reminders to display for this patient.  There are no preventive care reminders to display for this patient.   Lab Results  Component Value Date   TSH 3.030 03/20/2019   Lab Results  Component Value Date   WBC 11.7 (H) 11/03/2018   HGB 13.5 11/03/2018   HCT 38.6 11/03/2018   MCV 89 11/03/2018   PLT 266 11/03/2018   Lab Results  Component Value Date   NA 138 03/20/2019   K 5.2 03/20/2019   CO2 18 (L) 03/20/2019   GLUCOSE 82 03/20/2019   BUN 15 03/20/2019   CREATININE 1.08 (H) 03/20/2019   BILITOT 0.4 03/20/2019   ALKPHOS 61 03/20/2019   AST 25 03/20/2019   ALT 20 03/20/2019   PROT 6.5 03/20/2019   ALBUMIN 4.2 03/20/2019   CALCIUM 9.9 03/20/2019   ANIONGAP 6 07/28/2015   Lab Results  Component Value Date   CHOL 181 11/03/2018   Lab Results  Component Value Date   HDL 67 11/03/2018   Lab Results  Component Value Date   LDLCALC 82 11/03/2018   Lab Results  Component Value Date   TRIG 162 (H) 11/03/2018   Lab Results  Component Value Date   CHOLHDL 2.7 11/03/2018   No results found for: HGBA1C     Assessment & Plan:   Problem List Items Addressed This Visit      Musculoskeletal and Integument   Spondylosis - Primary   Relevant Medications   tizanidine (ZANAFLEX) 2 MG capsule    traMADol (ULTRAM) 50 MG tablet   predniSONE (STERAPRED UNI-PAK 48 TAB) 10 MG (48) TBPK tablet   Degenerative arthritis of spine   Relevant Medications   DULoxetine (CYMBALTA) 20 MG capsule   tizanidine (ZANAFLEX) 2 MG capsule   traMADol (ULTRAM) 50 MG tablet   predniSONE (STERAPRED UNI-PAK 48 TAB) 10 MG (48) TBPK tablet     Other   Spinal stenosis, lumbar region, with neurogenic claudication   Relevant Medications   DULoxetine (CYMBALTA) 20 MG capsule   tizanidine (ZANAFLEX) 2 MG capsule   traMADol (ULTRAM) 50 MG tablet   predniSONE (STERAPRED UNI-PAK 48 TAB) 10 MG (48) TBPK tablet       Meds ordered this encounter  Medications  . DULoxetine (CYMBALTA) 20 MG capsule    Sig: Take 1 capsule (20 mg total) by mouth daily.    Dispense:  30 capsule    Refill:  3    Order Specific Question:   Supervising Provider    Answer:   [name] [id number]  . tizanidine (ZANAFLEX) 2 MG capsule    Sig: Take 1 capsule (2 mg total) by mouth 3 (three) times daily.    Dispense:       Order Specific Question:   Supervising Provider    Answer:   [name] [id number]  . traMADol (ULTRAM) 50 MG tablet    Sig: Take 1 tablet (50  mg total) by mouth every 8 (eight) hours as needed for severe pain.    Dispense:  90 tablet    Refill:  1    Order Specific Question:   Supervising Provider    Answer:   [name] [id number]  . predniSONE (STERAPRED UNI-PAK 48 TAB) 10 MG (48) TBPK tablet    Sig: Take as directed for 12 days    Dispense:  48 tablet    Refill:  0    Order Specific Question:   Supervising Provider    Answer:   [name] [id number]  . methylPREDNISolone acetate (DEPO-MEDROL) injection 80 mg     Terald Sleeper, PA-C

## 2019-07-26 ENCOUNTER — Other Ambulatory Visit: Payer: Self-pay | Admitting: Physician Assistant

## 2019-07-26 ENCOUNTER — Telehealth: Payer: Self-pay | Admitting: Physician Assistant

## 2019-07-26 MED ORDER — PREDNISONE 10 MG PO TABS: ORAL_TABLET | ORAL | 0 refills | Status: DC

## 2019-07-26 NOTE — Telephone Encounter (Signed)
Sent script like the one Dr.Stacks did

## 2019-07-26 NOTE — Telephone Encounter (Signed)
Patient aware.

## 2019-07-26 NOTE — Telephone Encounter (Signed)
What symptoms do you have? Pain in left buttock. Whatever Stacks put her on helped her a lot in April of last year  How long have you been sick? Started 1-27 after making bed and twisted around and felt stabbing pain in left butt  Have you been seen for this problem? Yes April of last year and seen Stacks  If your provider decides to give you a prescription, which pharmacy would you like for it to be sent to? [facility]   Patient informed that this information will be sent to the clinical staff for review and that they should receive a follow up call.

## 2019-08-07 ENCOUNTER — Other Ambulatory Visit: Payer: Self-pay | Admitting: Physician Assistant

## 2019-08-07 DIAGNOSIS — M47816 Spondylosis without myelopathy or radiculopathy, lumbar region: Secondary | ICD-10-CM

## 2019-08-07 DIAGNOSIS — M48062 Spinal stenosis, lumbar region with neurogenic claudication: Secondary | ICD-10-CM

## 2019-08-08 ENCOUNTER — Ambulatory Visit: Payer: Medicare Other | Admitting: Physician Assistant

## 2019-08-08 ENCOUNTER — Other Ambulatory Visit: Payer: Self-pay

## 2019-08-08 ENCOUNTER — Ambulatory Visit (INDEPENDENT_AMBULATORY_CARE_PROVIDER_SITE_OTHER): Payer: Medicare Other | Admitting: Physician Assistant

## 2019-08-08 ENCOUNTER — Ambulatory Visit (INDEPENDENT_AMBULATORY_CARE_PROVIDER_SITE_OTHER): Payer: Medicare Other

## 2019-08-08 ENCOUNTER — Encounter: Payer: Self-pay | Admitting: Physician Assistant

## 2019-08-08 VITALS — BP 121/64 | HR 74 | Temp 99.6°F | Ht 67.0 in | Wt 194.0 lb

## 2019-08-08 DIAGNOSIS — M5432 Sciatica, left side: Secondary | ICD-10-CM

## 2019-08-08 DIAGNOSIS — M48062 Spinal stenosis, lumbar region with neurogenic claudication: Secondary | ICD-10-CM | POA: Diagnosis not present

## 2019-08-08 DIAGNOSIS — M545 Low back pain: Secondary | ICD-10-CM

## 2019-08-08 MED ORDER — METHYLPREDNISOLONE ACETATE 80 MG/ML IJ SUSP
80.0000 mg | Freq: Once | INTRAMUSCULAR | Status: AC
  Administered 2019-08-08: 11:00:00 80 mg via INTRAMUSCULAR

## 2019-08-10 ENCOUNTER — Other Ambulatory Visit: Payer: Self-pay | Admitting: Physician Assistant

## 2019-08-10 ENCOUNTER — Encounter: Payer: Self-pay | Admitting: Physician Assistant

## 2019-08-10 DIAGNOSIS — M5432 Sciatica, left side: Secondary | ICD-10-CM

## 2019-08-10 DIAGNOSIS — M47816 Spondylosis without myelopathy or radiculopathy, lumbar region: Secondary | ICD-10-CM

## 2019-08-10 DIAGNOSIS — M48062 Spinal stenosis, lumbar region with neurogenic claudication: Secondary | ICD-10-CM

## 2019-08-10 NOTE — Progress Notes (Signed)
Acute Office Visit  Subjective:    Patient ID: Brenda Solis, female    DOB: Jun 24, 1935, 85 y.o.   MRN: [id number]  Chief Complaint  Patient presents with  . Back Pain    left buttock pain. Stabbing pain. Getting worse. Radiates down left leg.    Back Pain This is a chronic problem. The current episode started more than 1 year ago. The problem occurs constantly. The problem has been rapidly worsening since onset. The pain is present in the lumbar spine, gluteal and sacro-iliac. The quality of the pain is described as burning and shooting. The pain radiates to the left knee. The pain is moderate. The symptoms are aggravated by bending, standing, twisting and sitting. Stiffness is present all day.  This patient has established degenerative disc disease and arthritis on many levels in her lumbar spine she is in the past.  This episode has gotten much worse than ever before and there is highly concentrated over her left lumbar and left SI area.   Past Medical History:  Diagnosis Date  . Depression   . Heart murmur   . Hyperlipidemia   . Hypertension   . Hypothyroidism   . Insomnia, unspecified   . Spondylosis of unspecified site without mention of myelopathy   . Thyroid disease    hypothyroidism    Past Surgical History:  Procedure Laterality Date  . APPENDECTOMY    . CATARACT EXTRACTION, BILATERAL Bilateral 2006  . [place]  . [place]  . CHOLECYSTECTOMY  1990  . DECOMPRESSIVE LUMBAR LAMINECTOMY LEVEL 1 N/A 02/01/2013   Procedure: CENTRAL DECOMPRESSIVE LUMBAR LAMINECTOMY LEVEL 1 L4-L5;  Surgeon: Tobi Bastos, MD;  Location: [facility];  Service: Orthopedics;  Laterality: N/A;  . DECOMPRESSIVE LUMBAR LAMINECTOMY LEVEL 1 N/A 11/20/2014   Procedure: REVISION L4-S1 DECOMPRESSION, L4-L5 IN SITU FUSION (LEVEL 1);  Surgeon: Melina Schools, MD;  Location: [facility];  Service: Orthopedics;  Laterality: N/A;  . KNEE SURGERY Right 2009   meniscus repair   . TOTAL HIP ARTHROPLASTY  2011   RIGHT    Family History  Problem Relation Age of Onset  . Coronary artery disease Brother        72s  . Heart disease Brother   . Cancer Mother        breast  . Osteoporosis Mother   . Heart disease Mother   . Osteoporosis Maternal Aunt   . Heart disease Father   . Stroke Father   . Esophageal varices Brother   . Stroke Other        FAMILY  . Esophageal varices Other        FAMILY    Social History   Socioeconomic History  . Marital status: Widowed    Spouse name: Not on file  . Number of children: 3  . Years of education: Not on file  . Highest education level: Not on file  Occupational History    Employer: RETIRED  Tobacco Use  . Smoking status: Never Smoker  . Smokeless tobacco: Never Used  Substance and Sexual Activity  . Alcohol use: No    Alcohol/week: 0.0 standard drinks  . Drug use: No  . Sexual activity: Not Currently  Other Topics Concern  . Not on file  Social History Narrative  . Not on file   Social Determinants of Health   Financial Resource Strain:   . Difficulty of Paying Living Expenses: Not on file  Food Insecurity:   . Worried About Charity fundraiser in the Last Year: Not on file  . Ran Out of Food in the Last Year: Not on file  Transportation Needs:   . Lack of Transportation (Medical): Not on file  . Lack of Transportation (Non-Medical): Not on file  Physical Activity:   . Days of Exercise per Week: Not on file  . Minutes of Exercise per Session: Not on file  Stress:   . Feeling of Stress : Not on file  Social Connections:   . Frequency of Communication with Friends and Family: Not on file  . Frequency of Social Gatherings with Friends and Family: Not on file  . Attends Religious Services: Not on file  . Active Member of Clubs or Organizations: Not on file  . Attends Archivist Meetings: Not on file  . Marital Status: Not on file  Intimate Partner Violence:   . Fear of Current or  Ex-Partner: Not on file  . Emotionally Abused: Not on file  . Physically Abused: Not on file  . Sexually Abused: Not on file    Outpatient Medications Prior to Visit  Medication Sig Dispense Refill  . ALPRAZolam (XANAX) 0.25 MG tablet Take 1 tablet (0.25 mg total) by mouth at bedtime. 30 tablet 1  . buPROPion (WELLBUTRIN XL) 300 MG 24 hr tablet Take 1 tablet (300 mg total) by mouth daily. 90 tablet 1  . cholecalciferol (VITAMIN D) 1000 UNITS tablet Take 2,000 Units by mouth daily.      Marland Kitchen escitalopram (LEXAPRO) 20 MG tablet Take 1 tablet (20 mg total) by mouth daily. 90 tablet 1  . levothyroxine (SYNTHROID) 75 MCG tablet TAKE 1 TABLET BY MOUTH DAILY BEFORE BREAKFAST 90 tablet 1  . meclizine (ANTIVERT) 25 MG tablet TAKE ONE TABLET BY MOUTH THREE TIMES DAILY AS NEEDED FOR DIZZINESS 30 tablet 2  . tizanidine (ZANAFLEX) 2 MG capsule Take 1 capsule (2 mg total) by mouth 3 (three) times daily.    . traMADol (ULTRAM) 50 MG tablet Take 1 tablet (50 mg total) by mouth every 8 (eight) hours as needed for severe pain. 90 tablet 1  . traZODone (DESYREL) 50 MG tablet Take 1-2 tablets (50-100 mg total) by mouth at bedtime as needed for sleep. 180 tablet 1  . DULoxetine (CYMBALTA) 20 MG capsule TAKE 1 CAPSULE BY MOUTH EVERY DAY 90 capsule 0  . predniSONE (DELTASONE) 10 MG tablet Take 5 daily for 2 days followed by 4,3,2 and 1 for 2 days each. 30 tablet 0   No facility-administered medications prior to visit.    Allergies  Allergen Reactions  . Gabapentin Other (See Comments)    Dizzy  . Oxycodone Itching    Review of Systems  Constitutional: Negative.   HENT: Negative.   Eyes: Negative.   Respiratory: Negative.   Gastrointestinal: Negative.   Genitourinary: Negative.   Musculoskeletal: Positive for arthralgias, back pain, gait problem and joint swelling.       Objective:    Physical Exam Constitutional:      General: She is not in acute distress.    Appearance: Normal appearance. She is  well-developed.  HENT:     Head: Normocephalic and atraumatic.  Eyes:     Conjunctiva/sclera: Conjunctivae normal.     Pupils: Pupils are equal, round, and reactive to light.  Cardiovascular:     Rate and Rhythm: Normal rate and regular rhythm.     Heart sounds: Normal heart sounds.  Pulmonary:  Effort: Pulmonary effort is normal.     Breath sounds: Normal breath sounds.  Abdominal:     General: Bowel sounds are normal.     Palpations: Abdomen is soft.  Musculoskeletal:     Lumbar back: Spasms, tenderness and bony tenderness present. Decreased range of motion. Positive left straight leg raise test.       Back:  Skin:    General: Skin is warm and dry.     Findings: No rash.  Neurological:     Mental Status: She is alert and oriented to person, place, and time.     Deep Tendon Reflexes: Reflexes are normal and symmetric.  Psychiatric:        Behavior: Behavior normal.        Thought Content: Thought content normal.        Judgment: Judgment normal.     BP 121/64   Pulse 74   Temp 99.6 F (37.6 C)   Ht 5\' 7"  (1.702 m)   Wt 194 lb (88 kg)   SpO2 95%   BMI 30.38 kg/m  Wt Readings from Last 3 Encounters:  08/08/19 194 lb (88 kg)  07/16/19 194 lb (88 kg)  05/04/19 197 lb (89.4 kg)    There are no preventive care reminders to display for this patient.  There are no preventive care reminders to display for this patient.   Lab Results  Component Value Date   TSH 3.030 03/20/2019   Lab Results  Component Value Date   WBC 11.7 (H) 11/03/2018   HGB 13.5 11/03/2018   HCT 38.6 11/03/2018   MCV 89 11/03/2018   PLT 266 11/03/2018   Lab Results  Component Value Date   NA 138 03/20/2019   K 5.2 03/20/2019   CO2 18 (L) 03/20/2019   GLUCOSE 82 03/20/2019   BUN 15 03/20/2019   CREATININE 1.08 (H) 03/20/2019   BILITOT 0.4 03/20/2019   ALKPHOS 61 03/20/2019   AST 25 03/20/2019   ALT 20 03/20/2019   PROT 6.5 03/20/2019   ALBUMIN 4.2 03/20/2019   CALCIUM 9.9  03/20/2019   ANIONGAP 6 07/28/2015   Lab Results  Component Value Date   CHOL 181 11/03/2018   Lab Results  Component Value Date   HDL 67 11/03/2018   Lab Results  Component Value Date   LDLCALC 82 11/03/2018   Lab Results  Component Value Date   TRIG 162 (H) 11/03/2018   Lab Results  Component Value Date   CHOLHDL 2.7 11/03/2018   No results found for: HGBA1C     Assessment & Plan:   Problem List Items Addressed This Visit      Other   Spinal stenosis, lumbar region, with neurogenic claudication - Primary   Relevant Orders   DG Lumbar Spine 2-3 Views (Completed)   DG Sacrum/Coccyx (Completed)    Other Visit Diagnoses    Sciatica of left side       Relevant Medications   methylPREDNISolone acetate (DEPO-MEDROL) injection 80 mg (Completed)   Other Relevant Orders   DG Lumbar Spine 2-3 Views (Completed)   DG Sacrum/Coccyx (Completed)       Meds ordered this encounter  Medications  . methylPREDNISolone acetate (DEPO-MEDROL) injection 80 mg     Terald Sleeper, PA-C

## 2019-08-21 ENCOUNTER — Ambulatory Visit (INDEPENDENT_AMBULATORY_CARE_PROVIDER_SITE_OTHER): Payer: Medicare Other | Admitting: *Deleted

## 2019-08-21 DIAGNOSIS — Z0000 Encounter for general adult medical examination without abnormal findings: Secondary | ICD-10-CM | POA: Diagnosis not present

## 2019-08-21 NOTE — Progress Notes (Signed)
MEDICARE ANNUAL WELLNESS VISIT  08/21/2019  Telephone Visit Disclaimer This Medicare AWV was conducted by telephone due to national recommendations for restrictions regarding the COVID-19 Pandemic (e.g. social distancing).  I verified, using two identifiers, that I am speaking with Brenda Solis or their authorized healthcare agent. I discussed the limitations, risks, security, and privacy concerns of performing an evaluation and management service by telephone and the potential availability of an in-person appointment in the future. The patient expressed understanding and agreed to proceed.   Subjective:  Brenda Solis is a 85 y.o. female patient of Terald Sleeper, PA-C who had a Medicare Annual Wellness Visit today via telephone. 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 She has 3 grown children who live locally. She reports that she is socially active and does interact with friends/family regularly. She is active in church. She is not currently physically active but did enjoy walking at the mall in the past. She enjoys watching tv.  Patient Care Team: Theodoro Clock as PCP - General (Physician Assistant)  Advanced Directives 08/21/2019 03/03/2018 07/28/2015 11/15/2014 10/16/2014 06/15/2014 06/15/2014  Does Patient Have a Medical Advance Directive? No No No No No No No  Would patient like information on creating a medical advance directive? No - Patient declined No - Patient declined - Yes - Scientist, clinical (histocompatibility and immunogenetics) given Yes - Scientist, clinical (histocompatibility and immunogenetics) given No - patient declined information No - patient declined information  Pre-existing out of facility DNR order (yellow form or pink MOST form) - - - - - - -    Hospital Utilization Over the Past 12 Months: # of hospitalizations or ER visits: 0 # of surgeries: 0  Review of Systems    Patient reports that her overall health is the same compared to last year.  History obtained from chart review and the patient and patient chart.    Patient Reported Readings (BP, Pulse, CBG, Weight, etc) none  Pain Assessment Pain : 0-10 Pain Score: 3  Pain Type: Acute pain Pain Location: Back Pain Orientation: Left Pain Radiating Towards: left leg Pain Descriptors / Indicators: Other (Comment)(catching) Pain Onset: 1 to 4 weeks ago Pain Frequency: Intermittent Pain Relieving Factors: steroid injection helped and tramadol Effect of Pain on Daily Activities: has to sit and rest  Pain Relieving Factors: steroid injection helped and tramadol  Current Medications & Allergies (verified) Allergies as of 08/21/2019      Reactions   Gabapentin Other (See Comments)   Dizzy   Oxycodone Itching      Medication List       Accurate as of August 21, 2019  3:01 PM. If you have any questions, ask your nurse or doctor.        ALPRAZolam 0.25 MG tablet Commonly known as: XANAX Take 1 tablet (0.25 mg total) by mouth at bedtime.   buPROPion 300 MG 24 hr tablet Commonly known as: WELLBUTRIN XL Take 1 tablet (300 mg total) by mouth daily.   cholecalciferol 1000 units tablet Commonly known as: VITAMIN D Take 2,000 Units by mouth daily.   DULoxetine 20 MG capsule Commonly known as: CYMBALTA Take 20 mg by mouth daily.   escitalopram 20 MG tablet Commonly known as: LEXAPRO Take 1 tablet (20 mg total) by mouth daily.   levothyroxine 75 MCG tablet Commonly known as: SYNTHROID TAKE 1 TABLET BY MOUTH DAILY BEFORE BREAKFAST   meclizine 25 MG tablet Commonly known as: ANTIVERT TAKE ONE TABLET BY MOUTH THREE TIMES  DAILY AS NEEDED FOR DIZZINESS   tizanidine 2 MG capsule Commonly known as: Zanaflex Take 1 capsule (2 mg total) by mouth 3 (three) times daily.   traMADol 50 MG tablet Commonly known as: ULTRAM Take 1 tablet (50 mg total) by mouth every 8 (eight) hours as needed for severe pain.   traZODone 50 MG tablet Commonly known as: DESYREL Take 1-2 tablets (50-100 mg total) by mouth at bedtime as needed for sleep.        History (reviewed): Past Medical History:  Diagnosis Date  . Depression   . Heart murmur   . Hyperlipidemia   . Hypertension   . Hypothyroidism   . Insomnia, unspecified   . Spondylosis of unspecified site without mention of myelopathy   . Thyroid disease    hypothyroidism   Past Surgical History:  Procedure Laterality Date  . APPENDECTOMY    . CATARACT EXTRACTION, BILATERAL Bilateral 2006  . [place]  . [place]  . CHOLECYSTECTOMY  1990  . DECOMPRESSIVE LUMBAR LAMINECTOMY LEVEL 1 N/A 02/01/2013   Procedure: CENTRAL DECOMPRESSIVE LUMBAR LAMINECTOMY LEVEL 1 L4-L5;  Surgeon: Tobi Bastos, MD;  Location: [facility];  Service: Orthopedics;  Laterality: N/A;  . DECOMPRESSIVE LUMBAR LAMINECTOMY LEVEL 1 N/A 11/20/2014   Procedure: REVISION L4-S1 DECOMPRESSION, L4-L5 IN SITU FUSION (LEVEL 1);  Surgeon: Melina Schools, MD;  Location: [facility];  Service: Orthopedics;  Laterality: N/A;  . KNEE SURGERY Right 2009   meniscus repair  . TOTAL HIP ARTHROPLASTY  2011   RIGHT   Family History  Problem Relation Age of Onset  . Coronary artery disease Brother        38s  . Heart disease Brother   . Cancer Mother        breast  . Osteoporosis Mother   . Heart disease Mother   . Osteoporosis Maternal Aunt   . Heart disease Father   . Stroke Father   . Esophageal varices Brother   . Stroke Other        FAMILY  . Esophageal varices Other        FAMILY   Social History   Socioeconomic History  . Marital status: Widowed    Spouse name: Not on file  . Number of children: 3  . Years of education: Not on file  . 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 Smoking status: Never Smoker  . Smokeless tobacco: Never Used  Substance and Sexual Activity  . Alcohol use: No    Alcohol/week: 0.0 standard drinks  . Drug use: No  . Sexual activity: Not Currently   Other Topics Concern  . Not on file  Social History Narrative  . Not on file   Social Determinants of Health   Financial Resource Strain:   . Difficulty of Paying Living Expenses: Not on file  Food Insecurity:   . Worried About Charity fundraiser in the Last Year: Not on file  . Ran Out of Food in the Last Year: Not on file  Transportation Needs:   . Lack of Transportation (Medical): Not on file  . Lack of Transportation (Non-Medical): Not on file  Physical Activity:   . Days of Exercise per Week: Not on file  . Minutes of Exercise per Session: Not on file  Stress:   . Feeling of Stress :  Not on file  Social Connections:   . Frequency of Communication with Friends and Family: Not on file  . Frequency of Social Gatherings with Friends and Family: Not on file  . Attends Religious Services: Not on file  . Active Member of Clubs or Organizations: Not on file  . Attends Archivist Meetings: Not on file  . Marital Status: Not on file    Activities of Daily Living In your present state of health, do you have any difficulty performing the following activities: 08/21/2019  Hearing? Y  Comment a little difficulty, no hearing exam  Vision? N  Difficulty concentrating or making decisions? Y  Comment forgets things once and a while  Walking or climbing stairs? N  Dressing or bathing? N  Doing errands, shopping? N  Preparing Food and eating ? N  Using the Toilet? N  In the past six months, have you accidently leaked urine? Y  Comment a few times at night due to urgency  Do you have problems with loss of bowel control? N  Managing your Medications? N  Managing your Finances? N  Housekeeping or managing your Housekeeping? N  Some recent data might be hidden    Patient Education/ Literacy How often do you need to have someone help you when you read instructions, pamphlets, or other written materials from your doctor or pharmacy?: 1 - Never What is the last grade level  you completed in school?: 12  Exercise Current Exercise Habits: The patient does not participate in regular exercise at present, Exercise limited by: orthopedic condition(s)(acute sciatica)  Diet Patient reports consuming 3 meals a day and 0 snack(s) a day Patient reports that her primary diet is: Regular Patient reports that she does have regular access to food.   Depression Screen PHQ 2/9 Scores 08/21/2019 08/08/2019 07/16/2019 05/04/2019 03/20/2019 01/12/2019 11/03/2018  PHQ - 2 Score 0 0 0 0 0 0 0  PHQ- 9 Score - - - - - - -     Fall Risk Fall Risk  08/21/2019 08/08/2019 07/16/2019 05/04/2019 01/12/2019  Falls in the past year? 1 1 0 0 1  Number falls in past yr: 1 0 - - 1  Comment 3 - - - -  Injury with Fall? 0 1 - - 1  Risk Factor Category  - - - - -  Risk for fall due to : History of fall(s) Other (Comment) - - Impaired balance/gait  Risk for fall due to: Comment - stabbing pain - - -  Follow up Falls prevention discussed Falls evaluation completed - - -     Objective:  Brenda Solis seemed alert and oriented and she participated appropriately during our telephone visit.  Blood Pressure Weight BMI  BP Readings from Last 3 Encounters:  08/08/19 121/64  07/16/19 (!) 120/53  05/04/19 (!) 149/68   Wt Readings from Last 3 Encounters:  08/08/19 194 lb (88 kg)  07/16/19 194 lb (88 kg)  05/04/19 197 lb (89.4 kg)   BMI Readings from Last 1 Encounters:  08/08/19 30.38 kg/m    *Unable to obtain current vital signs, weight, and BMI due to telephone visit type  Hearing/Vision  . Brenda Solis did not seem to have difficulty with hearing/understanding during the telephone conversation . Reports that she has not had a formal eye exam by an eye care professional within the past year . Reports that she has not had a formal hearing evaluation within the past year *Unable to fully assess hearing and  vision during telephone visit type  Cognitive Function: 6CIT Screen 08/21/2019  What Year? 0 points   What month? 0 points  What time? 0 points  Count back from 20 0 points  Months in reverse 2 points  Repeat phrase 2 points  Total Score 4   (Normal:0-7, Significant for Dysfunction: >8)  Normal Cognitive Function Screening: Yes   Immunization & Health Maintenance Record Immunization History  Administered Date(s) Administered  . Fluad Quad(high Dose 65+) 05/04/2019  . Influenza, High Dose Seasonal PF 05/06/2016, 04/14/2017, 03/30/2018  . Influenza,inj,Quad PF,6+ Mos 03/28/2013, 04/24/2014, 06/20/2015  . Pneumococcal Conjugate-13 10/16/2014  . Pneumococcal Polysaccharide-23 05/28/2006  . Td 06/29/1995, 05/26/2011  . Tdap 05/26/2011    Health Maintenance  Topic Date Due  . Samul Dada  05/25/2021  . INFLUENZA VACCINE  Completed  . DEXA SCAN  Completed  . PNA vac Low Risk Adult  Completed       Assessment  This is a routine wellness examination for Brenda Solis.  Health Maintenance: Due or Overdue There are no preventive care reminders to display for this patient.  Brenda Solis does not need a referral for Community Assistance: Care Management:   no Social Work:    no Prescription Assistance:  no Nutrition/Diabetes Education:  no   Plan:  Personalized Goals Goals Addressed            This Visit's Progress   . Patient Stated       08/21/2019 AWV Goal: Fall Prevention  . Over the next year, patient will decrease their risk for falls by: o Using assistive devices, such as a cane or walker, as needed o Identifying fall risks within their home and correcting them by: - Removing throw rugs - Adding handrails to stairs or ramps - Removing clutter and keeping a clear pathway throughout the home - Increasing light, especially at night - Adding shower handles/bars - Raising toilet seat o Identifying potential personal risk factors for falls: - Medication side effects - Incontinence/urgency - Vestibular dysfunction - Hearing loss - Musculoskeletal  disorders - Neurological disorders - Orthostatic hypotension        Personalized Health Maintenance & Screening Recommendations    Lung Cancer Screening Recommended: no (Low Dose CT Chest recommended if Age 67-80 years, 30 pack-year currently smoking OR have quit w/in past 15 years) Hepatitis C Screening recommended: no HIV Screening recommended: no  Advanced Directives: Written information was not prepared per patient's request.  Referrals & Orders No orders of the defined types were placed in this encounter.   Follow-up Plan . Follow-up with Terald Sleeper, PA-C as planned   I have personally reviewed and noted the following in the patient's chart:   . Medical and social history . Use of alcohol, tobacco or illicit drugs  . Current medications and supplements . Functional ability and status . Nutritional status . Physical activity . Advanced directives . List of other physicians . Hospitalizations, surgeries, and ER visits in previous 12 months . Vitals . Screenings to include cognitive, depression, and falls . Referrals and appointments  In addition, I have reviewed and discussed with Brenda Solis certain preventive protocols, quality metrics, and best practice recommendations. A written personalized care plan for preventive services as well as general preventive health recommendations is available and can be mailed to the patient at her request.      Baldomero Lamy, LPN  [id number]

## 2019-08-23 ENCOUNTER — Other Ambulatory Visit: Payer: Self-pay | Admitting: Physician Assistant

## 2019-08-23 DIAGNOSIS — F419 Anxiety disorder, unspecified: Secondary | ICD-10-CM

## 2019-08-31 ENCOUNTER — Other Ambulatory Visit: Payer: Self-pay

## 2019-08-31 ENCOUNTER — Ambulatory Visit (HOSPITAL_COMMUNITY)
Admission: RE | Admit: 2019-08-31 | Discharge: 2019-08-31 | Disposition: A | Discharge status: Home / Self Care | Payer: Medicare Other | Source: Ambulatory Visit | Attending: Physician Assistant | Admitting: Physician Assistant

## 2019-08-31 DIAGNOSIS — M47816 Spondylosis without myelopathy or radiculopathy, lumbar region: Secondary | ICD-10-CM

## 2019-08-31 DIAGNOSIS — M48062 Spinal stenosis, lumbar region with neurogenic claudication: Secondary | ICD-10-CM

## 2019-08-31 DIAGNOSIS — M545 Low back pain: Secondary | ICD-10-CM | POA: Diagnosis not present

## 2019-08-31 DIAGNOSIS — M5432 Sciatica, left side: Secondary | ICD-10-CM

## 2019-09-11 ENCOUNTER — Other Ambulatory Visit: Payer: Self-pay | Admitting: Physician Assistant

## 2019-09-11 DIAGNOSIS — M47816 Spondylosis without myelopathy or radiculopathy, lumbar region: Secondary | ICD-10-CM

## 2019-09-11 NOTE — Progress Notes (Signed)
tox 

## 2019-09-12 DIAGNOSIS — I480 Paroxysmal atrial fibrillation: Secondary | ICD-10-CM | POA: Diagnosis not present

## 2019-09-17 ENCOUNTER — Other Ambulatory Visit: Payer: Self-pay

## 2019-09-17 ENCOUNTER — Encounter: Payer: Self-pay | Admitting: Family Medicine

## 2019-09-17 ENCOUNTER — Ambulatory Visit (INDEPENDENT_AMBULATORY_CARE_PROVIDER_SITE_OTHER): Payer: Medicare Other | Admitting: Family Medicine

## 2019-09-17 ENCOUNTER — Ambulatory Visit: Payer: Medicare Other | Admitting: Physician Assistant

## 2019-09-17 VITALS — BP 136/66 | HR 82 | Temp 98.4°F | Ht 67.0 in | Wt 199.0 lb

## 2019-09-17 DIAGNOSIS — H9193 Unspecified hearing loss, bilateral: Secondary | ICD-10-CM | POA: Diagnosis not present

## 2019-09-17 DIAGNOSIS — M5136 Other intervertebral disc degeneration, lumbar region: Secondary | ICD-10-CM | POA: Diagnosis not present

## 2019-09-17 MED ORDER — METHYLPREDNISOLONE ACETATE 40 MG/ML IJ SUSP
40.0000 mg | Freq: Once | INTRAMUSCULAR | Status: AC
  Administered 2019-09-17: 40 mg via INTRAMUSCULAR

## 2019-09-17 MED ORDER — PREDNISONE 10 MG PO TABS: ORAL_TABLET | ORAL | 0 refills | Status: DC

## 2019-09-17 NOTE — Patient Instructions (Signed)
Call me if you decide you want to see Dr Rolena Infante again. Let me know when you want to see the hearing doctor.  Ears looked clean today.

## 2019-09-17 NOTE — Progress Notes (Signed)
Subjective: CC: acute on chronic low back pain/ review MRI PCP: Terald Sleeper, PA-C [id number] Brenda Solis is a 85 y.o. female presenting to clinic today for:  1.  Acute on chronic back pain Patient here for follow-up of MRI results.  She is continue to have acute on chronic low back pain.  She points to the middle of her back near the sacrum as the area of most pain.  Pain is relieved by sitting or lying but exacerbated by standing.  She denies any lower extremity weakness, numbness, tingling, buckling of the legs.  No saddle anesthesia, fecal incontinence or urinary retention.  She has history of 2 back surgeries in the past, one with Dr. Gladstone Lighter in 2014 and the second with Dr. Rolena Infante in 2016 during which time she had a lumbar fusion.  She has been using tramadol without much improvement in symptoms.  She notes that Tylenol actually seems to help more and wonders if she can switch back to that.  2.  Decreased hearing Patient reports couple week history of bilateral decrease in hearing.  She wonders if she has wax buildup like to have [place] check that out today.  Not report any dizziness.   ROS: Per HPI  Allergies  Allergen Reactions  . Gabapentin Other (See Comments)    Dizzy  . Oxycodone Itching   Past Medical History:  Diagnosis Date  . Depression   . Heart murmur   . Hyperlipidemia   . Hypertension   . Hypothyroidism   . Insomnia, unspecified   . Spondylosis of unspecified site without mention of myelopathy   . Thyroid disease    hypothyroidism    Current Outpatient Medications:  .  ALPRAZolam (XANAX) 0.25 MG tablet, TAKE 1 TABLET (0.25 MG TOTAL) BY MOUTH AT BEDTIME., Disp: 30 tablet, Rfl: 1 .  buPROPion (WELLBUTRIN XL) 300 MG 24 hr tablet, Take 1 tablet (300 mg total) by mouth daily., Disp: 90 tablet, Rfl: 1 .  cholecalciferol (VITAMIN D) 1000 UNITS tablet, Take 2,000 Units by mouth daily.  , Disp: , Rfl:  .  DULoxetine (CYMBALTA) 20 MG capsule, Take 20 mg by mouth daily.,  Disp: , Rfl:  .  escitalopram (LEXAPRO) 20 MG tablet, Take 1 tablet (20 mg total) by mouth daily., Disp: 90 tablet, Rfl: 1 .  levothyroxine (SYNTHROID) 75 MCG tablet, TAKE 1 TABLET BY MOUTH DAILY BEFORE BREAKFAST, Disp: 90 tablet, Rfl: 1 .  meclizine (ANTIVERT) 25 MG tablet, TAKE ONE TABLET BY MOUTH THREE TIMES DAILY AS NEEDED FOR DIZZINESS, Disp: 30 tablet, Rfl: 2 .  tizanidine (ZANAFLEX) 2 MG capsule, Take 1 capsule (2 mg total) by mouth 3 (three) times daily., Disp:  , Rfl:  .  traMADol (ULTRAM) 50 MG tablet, Take 1 tablet (50 mg total) by mouth every 8 (eight) hours as needed for severe pain., Disp: 90 tablet, Rfl: 1 .  traZODone (DESYREL) 50 MG tablet, Take 1-2 tablets (50-100 mg total) by mouth at bedtime as needed for sleep., Disp: 180 tablet, Rfl: 1 Social History   Socioeconomic History  . Marital status: Widowed    Spouse name: Not on file  . Number of children: 3  . Years of education: Not on file  . 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 Smoking status: Never Smoker  . Smokeless tobacco: Never Used  Substance and Sexual Activity  . Alcohol use: No  Alcohol/week: 0.0 standard drinks  . Drug use: No  . Sexual activity: Not Currently  Other Topics Concern  . Not on file  Social History Narrative  . Not on file   Social Determinants of Health   Financial Resource Strain:   . Difficulty of Paying Living Expenses:   Food Insecurity:   . Worried About Charity fundraiser in the Last Year:   . Arboriculturist in the Last Year:   Transportation Needs:   . Film/video editor (Medical):   Marland Kitchen Lack of Transportation (Non-Medical):   Physical Activity:   . Days of Exercise per Week:   . Minutes of Exercise per Session:   Stress:   . Feeling of Stress :   Social Connections:   . Frequency of Communication with Friends and Family:   . Frequency of Social Gatherings with Friends  and Family:   . Attends Religious Services:   . Active Member of Clubs or Organizations:   . Attends Archivist Meetings:   Marland Kitchen Marital Status:   Intimate Partner Violence:   . Fear of Current or Ex-Partner:   . Emotionally Abused:   Marland Kitchen Physically Abused:   . Sexually Abused:    Family History  Problem Relation Age of Onset  . Coronary artery disease Brother        79s  . Heart disease Brother   . Cancer Mother        breast  . Osteoporosis Mother   . Heart disease Mother   . Osteoporosis Maternal Aunt   . Heart disease Father   . Stroke Father   . Esophageal varices Brother   . Stroke Other        FAMILY  . Esophageal varices Other        FAMILY    Objective: Office vital signs reviewed. BP 136/66   Pulse 82   Temp 98.4 F (36.9 C) (Temporal)   Ht 5\' 7"  (1.702 m)   Wt 199 lb (90.3 kg)   SpO2 96%   BMI 31.17 kg/m   Physical Examination:  General: Awake, alert, well nourished, No acute distress HEENT: Normal    Ears: Tympanic membranes intact, normal light reflex, no erythema, no bulging Extremities: warm, well perfused, No edema, cyanosis or clubbing; +2 pulses bilaterally MSK: slow antalgic gait and hunched station; uses cane for ambulation.  Lumbar spine: Active range of motion is limited.  She has no midline tenderness palpation.  No paraspinal muscle tenderness palpation.  No palpable bony abnormalities.  Well-healed lumbar scar noted Neuro: Light touch sensation grossly intact  Assessment/ Plan: 85 y.o. female   1. Degeneration, intervertebral disc, lumbar I reviewed the MRI results with the patient today.  This is significant for lumbar degenerative changes.  I have given her a dose of Depo-Medrol here in office as well as given her a steroid Dosepak to start tomorrow.  I discussed with her consideration for seeing Dr. Rolena Infante again for other treatments.  She will contact me if she decides she wants to proceed with this.  For now she will proceed  with scheduled Tylenol.  Tramadol discontinued as this was ineffective.  She understands red flag signs and symptoms warranting further evaluation and will follow up as needed - methylPREDNISolone acetate (DEPO-MEDROL) injection 40 mg - predniSONE (DELTASONE) 10 MG tablet; Take 60mg  by mouth day 1, 50mg  day 2, 40mg  day 3, 30mg  day 4, 20mg  day 5, 10mg  day 6.  Then stop.  Dispense: 21 tablet; Refill: 0  2. Decreased hearing of both ears No evidence of obstruction.  This may be physiologic hearing loss.  I did offer referral to audiology for hearing aid but she would like to hold off on this as well.   No orders of the defined types were placed in this encounter.  No orders of the defined types were placed in this encounter.    [name], DO [place] [phone]

## 2019-10-11 ENCOUNTER — Telehealth: Payer: Self-pay | Admitting: Family Medicine

## 2019-10-11 DIAGNOSIS — M47816 Spondylosis without myelopathy or radiculopathy, lumbar region: Secondary | ICD-10-CM

## 2019-10-11 NOTE — Telephone Encounter (Signed)
  REFERRAL REQUEST Telephone Note 10/11/2019  What type of referral do you need? Ortho  Have you been seen at our office for this problem? Yes by Particia Nearing - Stated she wanted to receive her injection first and then decide if she would like to go to Ortho & at this time she does. (Advise that they may need an appointment with their PCP before a referral can be done)  Is there a particular doctor or location that you prefer? [facility] - Dr.Brooks.  Patient notified that referrals can take up to a week or longer to process. If they haven't heard anything within a week they should call back and speak with the referral department.

## 2019-10-15 NOTE — Telephone Encounter (Signed)
Referral placed for the patient

## 2019-10-25 ENCOUNTER — Other Ambulatory Visit: Payer: Self-pay | Admitting: *Deleted

## 2019-10-25 DIAGNOSIS — F419 Anxiety disorder, unspecified: Secondary | ICD-10-CM

## 2019-10-29 ENCOUNTER — Other Ambulatory Visit: Payer: Self-pay | Admitting: *Deleted

## 2019-10-29 DIAGNOSIS — F419 Anxiety disorder, unspecified: Secondary | ICD-10-CM

## 2019-10-30 DIAGNOSIS — M48062 Spinal stenosis, lumbar region with neurogenic claudication: Secondary | ICD-10-CM | POA: Diagnosis not present

## 2019-10-30 DIAGNOSIS — M545 Low back pain: Secondary | ICD-10-CM | POA: Diagnosis not present

## 2019-10-31 ENCOUNTER — Encounter: Payer: Self-pay | Admitting: Family Medicine

## 2019-10-31 DIAGNOSIS — R0789 Other chest pain: Secondary | ICD-10-CM | POA: Diagnosis not present

## 2019-10-31 DIAGNOSIS — J9801 Acute bronchospasm: Secondary | ICD-10-CM | POA: Diagnosis not present

## 2019-10-31 DIAGNOSIS — R0602 Shortness of breath: Secondary | ICD-10-CM | POA: Diagnosis not present

## 2019-11-02 ENCOUNTER — Other Ambulatory Visit: Payer: Medicare Other

## 2019-11-02 ENCOUNTER — Ambulatory Visit: Payer: Medicare Other | Admitting: Physician Assistant

## 2019-11-05 ENCOUNTER — Other Ambulatory Visit: Payer: Self-pay | Admitting: *Deleted

## 2019-11-05 MED ORDER — DULOXETINE HCL 20 MG PO CPEP: 20.0000 mg | ORAL_CAPSULE | Freq: Every day | ORAL | 0 refills | Status: DC

## 2019-11-15 ENCOUNTER — Other Ambulatory Visit: Payer: Self-pay | Admitting: *Deleted

## 2019-11-15 MED ORDER — TRAZODONE HCL 50 MG PO TABS: 50.0000 mg | ORAL_TABLET | Freq: Every evening | ORAL | 0 refills | Status: DC | PRN

## 2019-11-18 ENCOUNTER — Other Ambulatory Visit: Payer: Self-pay | Admitting: Family

## 2019-12-14 ENCOUNTER — Other Ambulatory Visit: Payer: Self-pay | Admitting: Family Medicine

## 2019-12-14 NOTE — Telephone Encounter (Signed)
Last office visit 09/17/2019 Last refill 11/15/2019, #60, no refills

## 2019-12-15 DIAGNOSIS — M5136 Other intervertebral disc degeneration, lumbar region: Secondary | ICD-10-CM | POA: Diagnosis not present

## 2019-12-26 ENCOUNTER — Telehealth: Payer: Self-pay | Admitting: *Deleted

## 2019-12-26 DIAGNOSIS — E039 Hypothyroidism, unspecified: Secondary | ICD-10-CM

## 2019-12-26 MED ORDER — LEVOTHYROXINE SODIUM 75 MCG PO TABS: ORAL_TABLET | ORAL | 0 refills | Status: DC

## 2019-12-26 NOTE — Telephone Encounter (Signed)
#  30 levothyroxine sent in to [facility] - pt will need OV with new PCP asap =Brenda Solis pt )

## 2019-12-26 NOTE — Telephone Encounter (Signed)
Appointment scheduled 01/22/2020 with Dr. Lajuana Ripple

## 2020-01-04 DIAGNOSIS — M48061 Spinal stenosis, lumbar region without neurogenic claudication: Secondary | ICD-10-CM | POA: Diagnosis not present

## 2020-01-07 DIAGNOSIS — M961 Postlaminectomy syndrome, not elsewhere classified: Secondary | ICD-10-CM | POA: Diagnosis not present

## 2020-01-10 ENCOUNTER — Other Ambulatory Visit: Payer: Self-pay | Admitting: *Deleted

## 2020-01-10 DIAGNOSIS — F32A Depression, unspecified: Secondary | ICD-10-CM

## 2020-01-10 MED ORDER — BUPROPION HCL ER (XL) 300 MG PO TB24: 300.0000 mg | ORAL_TABLET | Freq: Every day | ORAL | 0 refills | Status: DC

## 2020-01-11 ENCOUNTER — Other Ambulatory Visit: Payer: Self-pay | Admitting: Family

## 2020-01-19 ENCOUNTER — Other Ambulatory Visit: Payer: Self-pay | Admitting: Family Medicine

## 2020-01-19 DIAGNOSIS — E039 Hypothyroidism, unspecified: Secondary | ICD-10-CM

## 2020-01-22 ENCOUNTER — Ambulatory Visit (INDEPENDENT_AMBULATORY_CARE_PROVIDER_SITE_OTHER): Payer: Medicare Other | Admitting: Family Medicine

## 2020-01-22 ENCOUNTER — Other Ambulatory Visit: Payer: Self-pay

## 2020-01-22 VITALS — BP 139/70 | HR 70 | Temp 97.4°F | Ht 67.0 in | Wt 211.0 lb

## 2020-01-22 DIAGNOSIS — Z7689 Persons encountering health services in other specified circumstances: Secondary | ICD-10-CM

## 2020-01-22 DIAGNOSIS — F419 Anxiety disorder, unspecified: Secondary | ICD-10-CM

## 2020-01-22 DIAGNOSIS — N1831 Chronic kidney disease, stage 3a: Secondary | ICD-10-CM

## 2020-01-22 DIAGNOSIS — I10 Essential (primary) hypertension: Secondary | ICD-10-CM

## 2020-01-22 DIAGNOSIS — M48062 Spinal stenosis, lumbar region with neurogenic claudication: Secondary | ICD-10-CM

## 2020-01-22 DIAGNOSIS — E039 Hypothyroidism, unspecified: Secondary | ICD-10-CM

## 2020-01-22 DIAGNOSIS — F329 Major depressive disorder, single episode, unspecified: Secondary | ICD-10-CM

## 2020-01-22 DIAGNOSIS — F32A Depression, unspecified: Secondary | ICD-10-CM

## 2020-01-22 DIAGNOSIS — Z79899 Other long term (current) drug therapy: Secondary | ICD-10-CM | POA: Diagnosis not present

## 2020-01-22 MED ORDER — TRAMADOL HCL 50 MG PO TABS: 25.0000 mg | ORAL_TABLET | Freq: Two times a day (BID) | ORAL | 0 refills | Status: DC | PRN

## 2020-01-22 MED ORDER — ESCITALOPRAM OXALATE 20 MG PO TABS: ORAL_TABLET | ORAL | 1 refills | Status: DC

## 2020-01-22 NOTE — Progress Notes (Signed)
Subjective: CC: est care, hypothyroidism, GAD, chronic back pain PCP: Brenda Norlander, DO LKT:Brenda Solis is a 85 y.o. female presenting to clinic today for:  1. Hypothyroidism History: acquired. No history of radiation to the neck.  She is had a posterior cervical spine surgery.  There is a family history in her mother thyroid disease.  Patient reports compliance with her Synthroid 75 mcg daily.  Denies any tremor, heart palpitations, change in bowel habit or weight.  No change in voice, difficulty swallowing.  2.  Generalized anxiety disorder Patient reports compliance with Cymbalta 20 mg daily, Lexapro 20 mg daily, trazodone 50 mg daily and Xanax 0.25 mg 1-2 times per month.  She is out of her Xanax.  She uses this extremely rarely and denies any excessive daytime sedation.  No falls.  No memory loss.  Certainly no alcohol use or drug use  3.  Chronic back pain Patient with known degenerative changes within the lumbar region.  She has neurogenic claudication.  She is followed by Dr. [name] and there is plans for possible spinal stimulator to be placed.  She notes that pain has been refractory to Celebrex, Lyrica.  She has had slight improvement in pain with tramadol previously but does not have this at home.  No history of seizure activity.  4.  Hypertension with CKD 3 Patient is not on any medications for blood pressure.  She is diet controlled.  She stopped the Celebrex as above.  She reports good urine output.  ROS: Per HPI  Allergies  Allergen Reactions  . Gabapentin Other (See Comments)    Dizzy  . Oxycodone Itching   Past Medical History:  Diagnosis Date  . Depression   . Heart murmur   . Hyperlipidemia   . Hypertension   . Hypothyroidism   . Insomnia, unspecified   . Spondylosis of unspecified site without mention of myelopathy   . Thyroid disease    hypothyroidism    Current Outpatient Medications:  .  ALPRAZolam (XANAX) 0.25 MG tablet, TAKE 1 TABLET  (0.25 MG TOTAL) BY MOUTH AT BEDTIME., Disp: 30 tablet, Rfl: 1 .  buPROPion (WELLBUTRIN XL) 300 MG 24 hr tablet, Take 1 tablet (300 mg total) by mouth daily., Disp: 30 tablet, Rfl: 0 .  cholecalciferol (VITAMIN D) 1000 UNITS tablet, Take 2,000 Units by mouth daily.  , Disp: , Rfl:  .  DULoxetine (CYMBALTA) 20 MG capsule, TAKE 1 CAPSULE (20 MG TOTAL) BY MOUTH DAILY. (NEEDS TO BE SEEN BEFORE NEXT REFILL), Disp: 30 capsule, Rfl: 0 .  escitalopram (LEXAPRO) 20 MG tablet, Take 1 tablet (20 mg total) by mouth daily., Disp: 90 tablet, Rfl: 1 .  levothyroxine (SYNTHROID) 75 MCG tablet, TAKE 1 TABLET BY MOUTH EVERY DAY BEFORE BREAKFAST, Disp: 30 tablet, Rfl: 0 .  meclizine (ANTIVERT) 25 MG tablet, TAKE ONE TABLET BY MOUTH THREE TIMES DAILY AS NEEDED FOR DIZZINESS, Disp: 30 tablet, Rfl: 2 .  predniSONE (DELTASONE) 10 MG tablet, Take 78m by mouth day 1, 56mday 2, 4060may 3, 55m3my 4, 20mg66m 5, 10mg 64m6.  Then stop., Disp: 21 tablet, Rfl: 0 .  tizanidine (ZANAFLEX) 2 MG capsule, Take 1 capsule (2 mg total) by mouth 3 (three) times daily., Disp:  , Rfl:  .  traZODone (DESYREL) 50 MG tablet, TAKE 1-2 TABLETS (50-100 MG TOTAL) BY MOUTH AT BEDTIME AS NEEDED FOR SLEEP., Disp: 180 tablet, Rfl: 1 Social History   Socioeconomic History  . Marital status:  Widowed    Spouse name: Not on file  . Number of children: 3  . Years of education: Not on file  . 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 Smoking status: Never Smoker  . Smokeless tobacco: Never Used  Vaping Use  . Vaping Use: Never used  Substance and Sexual Activity  . Alcohol use: No    Alcohol/week: 0.0 standard drinks  . Drug use: No  . Sexual activity: Not Currently  Other Topics Concern  . Not on file  Social History Narrative  . Not on file   Social Determinants of Health   Financial Resource Strain:   . Difficulty of Paying Living  Expenses:   Food Insecurity:   . Worried About Programme researcher, broadcasting/film/video in the Last Year:   . Barista in the Last Year:   Transportation Needs:   . Freight forwarder (Medical):   Marland Kitchen Lack of Transportation (Non-Medical):   Physical Activity:   . Days of Exercise per Week:   . Minutes of Exercise per Session:   Stress:   . Feeling of Stress :   Social Connections:   . Frequency of Communication with Friends and Family:   . Frequency of Social Gatherings with Friends and Family:   . Attends Religious Services:   . Active Member of Clubs or Organizations:   . Attends Banker Meetings:   Marland Kitchen Marital Status:   Intimate Partner Violence:   . Fear of Current or Ex-Partner:   . Emotionally Abused:   Marland Kitchen Physically Abused:   . Sexually Abused:    Family History  Problem Relation Age of Onset  . Coronary artery disease Brother        66s  . Heart disease Brother   . Cancer Mother        breast  . Osteoporosis Mother   . Heart disease Mother   . Osteoporosis Maternal Aunt   . Heart disease Father   . Stroke Father   . Esophageal varices Brother   . Stroke Other        FAMILY  . Esophageal varices Other        FAMILY    Objective: Office vital signs reviewed. BP (!) 139/70   Pulse 70   Temp (!) 97.4 F (36.3 C) (Temporal)   Ht 5\' 7"  (1.702 m)   Wt (!) 211 lb (95.7 kg)   SpO2 96%   BMI 33.05 kg/m   Physical Examination:  General: Awake, alert, well nourished, No acute distress HEENT: Normal; no exophthalmos.  No thyromegaly or palpable thyroid mass or nodules Cardio: Regular rate and rhythm.  S1-S2 heard.  No murmurs Pulmonary: Clear to auscultation bilaterally.  No wheezes, rhonchi or rales.  Normal breathing on room air Extremities: warm, well perfused, No edema, cyanosis or clubbing; +2 pulses bilaterally MSK: slow antalgic gait and hunched station; uses cane for ambulation.  Assessment/ Plan: 85 y.o. female   1. Essential hypertension Diet  controlled but borderline. - CMP14+EGFR  2. Stage 3a chronic kidney disease Instructed her today to avoid NSAIDs totally given CKD 3 - CMP14+EGFR - CBC  3. Establishing care with new doctor, encounter for  4. Hypothyroidism, unspecified type Asymptomatic.  Check thyroid panel - Thyroid Panel With TSH  5. Anxiety We discussed to forego the benzodiazepine.  I am going to taper off of  Lexapro and up on Cymbalta to see if we can help with pain control.  I certainly do not think that she should be on Cymbalta, Lexapro and trazodone as she has a higher risk of serotonin syndrome with this combination.  She will reduce Lexapro to 10 mg daily for the next 2 weeks and then plan to discontinue totally.  I will have a telephone visit with her in 2 weeks at which point I will increase both to 30 mg.  I advised her of the risk with tramadol as well for serotonin syndrome.  She voiced an understanding will use the medication extremely sparingly. - ToxASSURE Select 13 (MW), Urine  6. Spinal stenosis, lumbar region, with neurogenic claudication Hopefully we can get her off of tramadol totally once her spinal stim is placed. - ToxASSURE Select 13 (MW), Urine - traMADol (ULTRAM) 50 MG tablet; Take 0.5-1 tablets (25-50 mg total) by mouth every 12 (twelve) hours as needed for severe pain.  Dispense: 60 tablet; Refill: 0  7. Controlled substance agreement signed UDS and CSC were completed as per office policy today - ToxASSURE Select 13 (MW), Urine  8. Depression, unspecified depression type - escitalopram (LEXAPRO) 20 MG tablet; Take 1/2 tablet daily for 2 weeks then stop.  Dispense: 90 tablet; Refill: 1    No orders of the defined types were placed in this encounter.  No orders of the defined types were placed in this encounter.    Brenda Norlander, DO [place] [phone]

## 2020-01-22 NOTE — Patient Instructions (Addendum)
STOP celebrex (your kidneys are impaired so you should AVOID NSAIDs = motrin, advil, aleve, naproxen, celebrex, mobic)  Tylenol is fine. We are starting Tramadol.  No xanax though.  Start cutting the Escitalopram (Lexapro) in half to take ONCE daily x 2 weeks.  Then can stop.  I will plan to increase your Cymbalta (duloxetine) to 30mg  in 2 weeks.  We can increase further in 4 weeks if needed.  You had labs performed today.  You will be contacted with the results of the labs once they are available, usually in the next 3 business days for routine lab work.  If you have an active my chart account, they will be released to your MyChart.  If you prefer to have these labs released to you via telephone, please let us know.  If you had a pap smear or biopsy performed, expect to be contacted in about 7-10 days.

## 2020-01-23 LAB — CMP14+EGFR
ALT: 17 IU/L (ref 0–32)
AST: 21 IU/L (ref 0–40)
Albumin/Globulin Ratio: 2.2 (ref 1.2–2.2)
Albumin: 4.7 g/dL — ABNORMAL HIGH (ref 3.6–4.6)
Alkaline Phosphatase: 48 IU/L (ref 48–121)
BUN/Creatinine Ratio: 18 (ref 12–28)
BUN: 18 mg/dL (ref 8–27)
Bilirubin Total: 0.3 mg/dL (ref 0.0–1.2)
CO2: 23 mmol/L (ref 20–29)
Calcium: 9.9 mg/dL (ref 8.7–10.3)
Chloride: 104 mmol/L (ref 96–106)
Creatinine, Ser: 1.01 mg/dL — ABNORMAL HIGH (ref 0.57–1.00)
GFR calc Af Amer: 59 mL/min/{1.73_m2} — ABNORMAL LOW (ref >59)
GFR calc non Af Amer: 52 mL/min/{1.73_m2} — ABNORMAL LOW (ref >59)
Globulin, Total: 2.1 g/dL (ref 1.5–4.5)
Glucose: 74 mg/dL (ref 65–99)
Potassium: 5 mmol/L (ref 3.5–5.2)
Sodium: 139 mmol/L (ref 134–144)
Total Protein: 6.8 g/dL (ref 6.0–8.5)

## 2020-01-23 LAB — CBC
Hematocrit: 38.2 % (ref 34.0–46.6)
Hemoglobin: 12.6 g/dL (ref 11.1–15.9)
MCH: 30.7 pg (ref 26.6–33.0)
MCHC: 33 g/dL (ref 31.5–35.7)
MCV: 93 fL (ref 79–97)
Platelets: 204 10*3/uL (ref 150–450)
RBC: 4.11 x10E6/uL (ref 3.77–5.28)
RDW: 13.5 % (ref 11.7–15.4)
WBC: 7.2 10*3/uL (ref 3.4–10.8)

## 2020-01-23 LAB — THYROID PANEL WITH TSH
Free Thyroxine Index: 2.6 (ref 1.2–4.9)
T3 Uptake Ratio: 30 % (ref 24–39)
T4, Total: 8.7 ug/dL (ref 4.5–12.0)
TSH: 3.82 u[IU]/mL (ref 0.450–4.500)

## 2020-01-23 NOTE — Progress Notes (Signed)
scheduled

## 2020-01-25 LAB — TOXASSURE SELECT 13 (MW), URINE

## 2020-02-06 ENCOUNTER — Ambulatory Visit (INDEPENDENT_AMBULATORY_CARE_PROVIDER_SITE_OTHER): Payer: Medicare Other | Admitting: Family Medicine

## 2020-02-06 ENCOUNTER — Other Ambulatory Visit: Payer: Self-pay | Admitting: Family Medicine

## 2020-02-06 DIAGNOSIS — F419 Anxiety disorder, unspecified: Secondary | ICD-10-CM

## 2020-02-06 DIAGNOSIS — M48062 Spinal stenosis, lumbar region with neurogenic claudication: Secondary | ICD-10-CM | POA: Diagnosis not present

## 2020-02-06 MED ORDER — DULOXETINE HCL 30 MG PO CPEP: 30.0000 mg | ORAL_CAPSULE | Freq: Every day | ORAL | 0 refills | Status: DC

## 2020-02-06 NOTE — Progress Notes (Signed)
Telephone visit  Subjective: CC: f/u mood, pain PCP: [name], DO ION:GEXB Brenda Solis is a 85 y.o. female calls for telephone consult today. Patient provides verbal consent for consult held via phone.  Due to COVID-19 pandemic this visit was conducted virtually. This visit type was conducted due to national recommendations for restrictions regarding the COVID-19 Pandemic (e.g. social distancing, sheltering in place) in an effort to limit this patient's exposure and mitigate transmission in our community. All issues noted in this document were discussed and addressed.  A physical exam was not performed with this format.   Location of patient: home Location of provider: [facility] Others present for call: none  1.  Generalized anxiety disorder Patient was seen about 2 weeks ago for generalized anxiety disorder and to establish care.  At that time she had reported use of Cymbalta 20 mg daily, Lexapro 20 mg daily, trazodone 50 mg daily.  She had no symptoms of serotonin syndrome.  There was concerns about her being on both an SNRI and an SSRI as well as trazodone.  She was tapered off of the Lexapro and is reporting to me for follow-up.  She is done well with the taper off of Lexapro and has not experienced any withdrawal symptoms or increased anxiety.  She continues to take Cymbalta daily.  She does want to go ahead and proceed with increased dose.  Continues to use Xanax sparingly.  She did follow-up with her spinal surgeon and saw the psychologist in preparation for spinal stimulator.  That visit went well.   ROS: Per HPI  Allergies  Allergen Reactions  . Gabapentin Other (See Comments)    Dizzy  . Oxycodone Itching   Past Medical History:  Diagnosis Date  . Depression   . Heart murmur   . Hyperlipidemia   . Hypertension   . Hypothyroidism   . Insomnia, unspecified   . Spondylosis of unspecified site without mention of myelopathy   . Thyroid disease    hypothyroidism     Current Outpatient Medications:  .  buPROPion (WELLBUTRIN XL) 300 MG 24 hr tablet, Take 1 tablet (300 mg total) by mouth daily., Disp: 30 tablet, Rfl: 0 .  cholecalciferol (VITAMIN D) 1000 UNITS tablet, Take 2,000 Units by mouth daily.  , Disp: , Rfl:  .  DULoxetine (CYMBALTA) 20 MG capsule, TAKE 1 CAPSULE (20 MG TOTAL) BY MOUTH DAILY. (NEEDS TO BE SEEN BEFORE NEXT REFILL), Disp: 90 capsule, Rfl: 0 .  escitalopram (LEXAPRO) 20 MG tablet, Take 1/2 tablet daily for 2 weeks then stop., Disp: 90 tablet, Rfl: 1 .  levothyroxine (SYNTHROID) 75 MCG tablet, TAKE 1 TABLET BY MOUTH EVERY DAY BEFORE BREAKFAST, Disp: 30 tablet, Rfl: 0 .  meclizine (ANTIVERT) 25 MG tablet, TAKE ONE TABLET BY MOUTH THREE TIMES DAILY AS NEEDED FOR DIZZINESS, Disp: 30 tablet, Rfl: 2 .  tizanidine (ZANAFLEX) 2 MG capsule, Take 1 capsule (2 mg total) by mouth 3 (three) times daily., Disp:  , Rfl:  .  traMADol (ULTRAM) 50 MG tablet, Take 0.5-1 tablets (25-50 mg total) by mouth every 12 (twelve) hours as needed for severe pain., Disp: 60 tablet, Rfl: 0 .  traZODone (DESYREL) 50 MG tablet, TAKE 1-2 TABLETS (50-100 MG TOTAL) BY MOUTH AT BEDTIME AS NEEDED FOR SLEEP., Disp: 180 tablet, Rfl: 1  Assessment/ Plan: 85 y.o. female   1. Anxiety STOP Lexapro.  Increase Cymbalta dose to 30mg  daily.  Caution serotonin syndrome with Tramadol (uses sparingly).  Follow up in September  for recheck.  Can increase dose further at that time if needed - DULoxetine (CYMBALTA) 30 MG capsule; Take 1 capsule (30 mg total) by mouth daily. CANCEL 20mg .  Dispense: 90 capsule; Refill: 0  2. Spinal stenosis, lumbar region, with neurogenic claudication - DULoxetine (CYMBALTA) 30 MG capsule; Take 1 capsule (30 mg total) by mouth daily. CANCEL 20mg .  Dispense: 90 capsule; Refill: 0   Start time: 10:57am End time: 11:03am  Total time spent on patient care (including telephone call/ virtual visit): 9 minutes  [place], [place] [phone]

## 2020-02-13 ENCOUNTER — Other Ambulatory Visit: Payer: Self-pay | Admitting: Family Medicine

## 2020-02-13 DIAGNOSIS — E039 Hypothyroidism, unspecified: Secondary | ICD-10-CM

## 2020-03-07 ENCOUNTER — Encounter: Payer: Self-pay | Admitting: Family Medicine

## 2020-03-07 ENCOUNTER — Ambulatory Visit (INDEPENDENT_AMBULATORY_CARE_PROVIDER_SITE_OTHER): Payer: Medicare Other | Admitting: Family Medicine

## 2020-03-07 VITALS — BP 131/66 | HR 67 | Temp 96.7°F | Ht 67.0 in | Wt 206.0 lb

## 2020-03-07 DIAGNOSIS — F419 Anxiety disorder, unspecified: Secondary | ICD-10-CM | POA: Diagnosis not present

## 2020-03-07 DIAGNOSIS — F3341 Major depressive disorder, recurrent, in partial remission: Secondary | ICD-10-CM | POA: Diagnosis not present

## 2020-03-07 DIAGNOSIS — M48062 Spinal stenosis, lumbar region with neurogenic claudication: Secondary | ICD-10-CM | POA: Diagnosis not present

## 2020-03-07 MED ORDER — METHYLPREDNISOLONE ACETATE 40 MG/ML IJ SUSP
40.0000 mg | Freq: Once | INTRAMUSCULAR | Status: AC
  Administered 2020-03-07: 40 mg via INTRAMUSCULAR

## 2020-03-07 MED ORDER — PREDNISONE 10 MG (21) PO TBPK: ORAL_TABLET | ORAL | 0 refills | Status: DC

## 2020-03-07 MED ORDER — DULOXETINE HCL 30 MG PO CPEP: 30.0000 mg | ORAL_CAPSULE | Freq: Every day | ORAL | 3 refills | Status: DC

## 2020-03-07 NOTE — Progress Notes (Signed)
Subjective: CC: back pain PCP: [name], DO TOI:ZTIW Brenda Solis is a 85 y.o. female presenting to clinic today for:  1. Back pain Worsening bilateral back pain with radiation to bilateral lower extremities.  She is currently waiting for insurance approve a spinal stimulator.  While the Cymbalta has not really helped with her back she does feel that her depressive symptoms are well controlled with the Cymbalta 30 mg daily.  She did not find tramadol to be helpful for the back and is not really using it.  She is using Salonpas or similar patch for back pain right now.  No red flag signs and symptoms including fecal incontinence, urinary retention or saddle anesthesia.  No falls.  She continues to ambulate with use of cane  ROS: Per HPI  Allergies  Allergen Reactions  . Gabapentin Other (See Comments)    Dizzy  . Oxycodone Itching   Past Medical History:  Diagnosis Date  . Depression   . Heart murmur   . Hyperlipidemia   . Hypertension   . Hypothyroidism   . Insomnia, unspecified   . Spondylosis of unspecified site without mention of myelopathy   . Thyroid disease    hypothyroidism    Current Outpatient Medications:  .  buPROPion (WELLBUTRIN XL) 300 MG 24 hr tablet, Take 1 tablet (300 mg total) by mouth daily., Disp: 30 tablet, Rfl: 0 .  cholecalciferol (VITAMIN D) 1000 UNITS tablet, Take 2,000 Units by mouth daily.  , Disp: , Rfl:  .  DULoxetine (CYMBALTA) 30 MG capsule, Take 1 capsule (30 mg total) by mouth daily. CANCEL 20mg ., Disp: 90 capsule, Rfl: 0 .  levothyroxine (SYNTHROID) 75 MCG tablet, TAKE 1 TABLET BY MOUTH EVERY DAY BEFORE BREAKFAST, Disp: 90 tablet, Rfl: 3 .  meclizine (ANTIVERT) 25 MG tablet, TAKE ONE TABLET BY MOUTH THREE TIMES DAILY AS NEEDED FOR DIZZINESS, Disp: 30 tablet, Rfl: 2 .  tizanidine (ZANAFLEX) 2 MG capsule, Take 1 capsule (2 mg total) by mouth 3 (three) times daily., Disp:  , Rfl:  .  traMADol (ULTRAM) 50 MG tablet, Take 0.5-1 tablets  (25-50 mg total) by mouth every 12 (twelve) hours as needed for severe pain., Disp: 60 tablet, Rfl: 0 .  traZODone (DESYREL) 50 MG tablet, TAKE 1-2 TABLETS (50-100 MG TOTAL) BY MOUTH AT BEDTIME AS NEEDED FOR SLEEP., Disp: 180 tablet, Rfl: 1 Social History   Socioeconomic History  . Marital status: Widowed    Spouse name: Not on file  . Number of children: 3  . Years of education: Not on file  . 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 Smoking status: Never Smoker  . Smokeless tobacco: Never Used  Vaping Use  . Vaping Use: Never used  Substance and Sexual Activity  . Alcohol use: No    Alcohol/week: 0.0 standard drinks  . Drug use: No  . Sexual activity: Not Currently  Other Topics Concern  . Not on file  Social History Narrative  . Not on file   Social Determinants of Health   Financial Resource Strain:   . Difficulty of Paying Living Expenses: Not on file  Food Insecurity:   . Worried About Charity fundraiser in the Last Year: Not on file  . Ran Out of Food in the Last Year: Not on file  Transportation Needs:   . Lack of Transportation (Medical): Not on  file  . Lack of Transportation (Non-Medical): Not on file  Physical Activity:   . Days of Exercise per Week: Not on file  . Minutes of Exercise per Session: Not on file  Stress:   . Feeling of Stress : Not on file  Social Connections:   . Frequency of Communication with Friends and Family: Not on file  . Frequency of Social Gatherings with Friends and Family: Not on file  . Attends Religious Services: Not on file  . Active Member of Clubs or Organizations: Not on file  . Attends Archivist Meetings: Not on file  . Marital Status: Not on file  Intimate Partner Violence:   . Fear of Current or Ex-Partner: Not on file  . Emotionally Abused: Not on file  . Physically Abused: Not on file  . Sexually Abused: Not on file    Family History  Problem Relation Age of Onset  . Coronary artery disease Brother        69s  . Heart disease Brother   . Cancer Mother        breast  . Osteoporosis Mother   . Heart disease Mother   . Osteoporosis Maternal Aunt   . Heart disease Father   . Stroke Father   . Esophageal varices Brother   . Stroke Other        FAMILY  . Esophageal varices Other        FAMILY    Objective: Office vital signs reviewed. BP 131/66   Pulse 67   Temp (!) 96.7 F (35.9 C) (Temporal)   Ht 5\' 7"  (1.702 m)   Wt 206 lb (93.4 kg)   BMI 32.26 kg/m   Physical Examination:  General: Awake, alert, well nourished, No acute distress HEENT: Normal, sclera white, MMM Cardio: regular rate and rhythm, S1S2 heard, no murmurs appreciated Pulm: clear to auscultation bilaterally, no wheezes, rhonchi or rales; normal work of breathing on room air MSK: antlagic gait and station; ambulating with cane.  Assessment/ Plan: 85 y.o. female   Spinal stenosis, lumbar region, with neurogenic claudication - Plan: methylPREDNISolone acetate (DEPO-MEDROL) injection 40 mg, predniSONE (STERAPRED UNI-PAK 21 TAB) 10 MG (21) TBPK tablet, DULoxetine (CYMBALTA) 30 MG capsule  Recurrent major depressive disorder, in partial remission ([place])  Anxiety - Plan: DULoxetine (CYMBALTA) 30 MG capsule  She was treated with a Depo-Medrol shot and she will start prednisone Dosepak tomorrow.  Continue Cymbalta.  She is demonstrating stability of her anxiety and depression with this.  We will wait to see what the back specialist has to say.  Okay to totally stop tramadol since this has not been helpful and has a high risk of serotonin syndrome given use of trazodone and SSRI.  I have completed a handicap placard form for her today.  She will follow-up with me as needed  No orders of the defined types were placed in this encounter.  No orders of the defined types were placed in this encounter.    [name],  DO [place] [phone]

## 2020-03-27 ENCOUNTER — Other Ambulatory Visit: Payer: Self-pay | Admitting: Family Medicine

## 2020-04-09 ENCOUNTER — Telehealth: Payer: Self-pay

## 2020-04-09 NOTE — Telephone Encounter (Signed)
Pt Has dry cough and scratchy throat.  Feels like bronchitis.  Could she get an antibiotic?  Has a procedure scheduled for 10/15 to get a spinal stimulator and is afraid this will get worse before then.  Uses [facility]

## 2020-04-09 NOTE — Telephone Encounter (Signed)
Appointment made tomorrow for earliest we had. She still wanted note sent to you to see if there was anything you would do?

## 2020-04-09 NOTE — Telephone Encounter (Signed)
NTBS. Unlikely to even be bacterial.  More likely viral and abx would be useless in that case.  Agree with appt

## 2020-04-10 ENCOUNTER — Ambulatory Visit: Payer: Medicare Other | Admitting: Family

## 2020-04-11 ENCOUNTER — Encounter: Payer: Self-pay | Admitting: Family Medicine

## 2020-04-11 ENCOUNTER — Ambulatory Visit (INDEPENDENT_AMBULATORY_CARE_PROVIDER_SITE_OTHER): Payer: Medicare Other | Admitting: Family Medicine

## 2020-04-11 DIAGNOSIS — M5416 Radiculopathy, lumbar region: Secondary | ICD-10-CM | POA: Diagnosis not present

## 2020-04-11 DIAGNOSIS — G894 Chronic pain syndrome: Secondary | ICD-10-CM | POA: Diagnosis not present

## 2020-04-11 DIAGNOSIS — J209 Acute bronchitis, unspecified: Secondary | ICD-10-CM | POA: Insufficient documentation

## 2020-04-11 MED ORDER — PREDNISONE 10 MG (21) PO TBPK: ORAL_TABLET | ORAL | 0 refills | Status: DC

## 2020-04-11 MED ORDER — AZITHROMYCIN 250 MG PO TABS: ORAL_TABLET | ORAL | 0 refills | Status: DC

## 2020-04-11 NOTE — Progress Notes (Signed)
Virtual Visit via telephone Note  I connected with Brenda Solis on 04/11/20 at 1157 by telephone and verified that I am speaking with the correct person using two identifiers. Brenda Solis is currently located at home and patient are currently with her during visit. The provider, Fransisca Kaufmann Jodine Muchmore, MD is located in their office at time of visit.  Call ended at 1205  I discussed the limitations, risks, security and privacy concerns of performing an evaluation and management service by telephone and the availability of in person appointments. I also discussed with the patient that there may be a patient responsible charge related to this service. The patient expressed understanding and agreed to proceed.   History and Present Illness: Patient is calling in for a sore throat and cough and now it is going into her chest.  It is a nonproductive hard cough.  She denies any shortness of breath or wheezing.  She had a temp of 100 around 4 days ago when this started. She went to a conference last week. She is vaccinated with moderna.   1. Bronchitis, acute, with bronchospasm     Outpatient Encounter Medications as of 04/11/2020  Medication Sig  . azithromycin (ZITHROMAX) 250 MG tablet Take 2 the first day and then one each day after.  Marland Kitchen buPROPion (WELLBUTRIN XL) 300 MG 24 hr tablet Take 1 tablet (300 mg total) by mouth daily.  . cholecalciferol (VITAMIN D) 1000 UNITS tablet Take 2,000 Units by mouth daily.    . DULoxetine (CYMBALTA) 30 MG capsule Take 1 capsule (30 mg total) by mouth daily. CANCEL 20mg .  . levothyroxine (SYNTHROID) 75 MCG tablet TAKE 1 TABLET BY MOUTH EVERY DAY BEFORE BREAKFAST  . meclizine (ANTIVERT) 25 MG tablet TAKE ONE TABLET BY MOUTH THREE TIMES DAILY AS NEEDED FOR DIZZINESS  . predniSONE (STERAPRED UNI-PAK 21 TAB) 10 MG (21) TBPK tablet As directed x 6 days  . tizanidine (ZANAFLEX) 2 MG capsule Take 1 capsule (2 mg total) by mouth 3 (three) times daily.  . traZODone  (DESYREL) 50 MG tablet TAKE 1-2 TABLETS (50-100 MG TOTAL) BY MOUTH AT BEDTIME AS NEEDED FOR SLEEP.  . [DISCONTINUED] predniSONE (STERAPRED UNI-PAK 21 TAB) 10 MG (21) TBPK tablet As directed x 6 days  . [DISCONTINUED] traZODone (DESYREL) 50 MG tablet TAKE 1-2 TABLETS (50-100 MG TOTAL) BY MOUTH AT BEDTIME AS NEEDED FOR SLEEP.   No facility-administered encounter medications on file as of 04/11/2020.    Review of Systems  Constitutional: Negative for chills and fever.  HENT: Positive for congestion, postnasal drip, rhinorrhea, sinus pressure, sneezing and sore throat. Negative for ear discharge and ear pain.   Eyes: Negative for pain, redness and visual disturbance.  Respiratory: Positive for cough. Negative for chest tightness and shortness of breath.   Cardiovascular: Negative for chest pain and leg swelling.  Genitourinary: Negative for difficulty urinating and dysuria.  Musculoskeletal: Negative for back pain and gait problem.  Skin: Negative for rash.  Neurological: Negative for light-headedness and headaches.  Psychiatric/Behavioral: Negative for agitation and behavioral problems.  All other systems reviewed and are negative.   Observations/Objective: Patient sounds comfortable and in no acute distress  Assessment and Plan: Problem List Items Addressed This Visit      Respiratory   Bronchitis, acute, with bronchospasm - Primary   Relevant Medications   azithromycin (ZITHROMAX) 250 MG tablet   predniSONE (STERAPRED UNI-PAK 21 TAB) 10 MG (21) TBPK tablet   Other Relevant Orders   Novel Coronavirus,  NAA ([facility])      Will treat like bronchitis but recommended quarantining and testing if possible, she just had a surgery so she cannot make it in very easily but we will see if we can through her daughter-in-law get tested for her. Follow up plan: Return if symptoms worsen or fail to improve.     I discussed the assessment and treatment plan with the patient. The patient was  provided an opportunity to ask questions and all were answered. The patient agreed with the plan and demonstrated an understanding of the instructions.   The patient was advised to call back or seek an in-person evaluation if the symptoms worsen or if the condition fails to improve as anticipated.  The above assessment and management plan was discussed with the patient. The patient verbalized understanding of and has agreed to the management plan. Patient is aware to call the clinic if symptoms persist or worsen. Patient is aware when to return to the clinic for a follow-up visit. Patient educated on when it is appropriate to go to the emergency department.    I provided 8 minutes of non-face-to-face time during this encounter.    Worthy Rancher, MD

## 2020-04-12 MED ORDER — PREDNISONE 10 MG (21) PO TBPK: ORAL_TABLET | ORAL | 0 refills | Status: DC

## 2020-04-12 MED ORDER — AZITHROMYCIN 250 MG PO TABS: ORAL_TABLET | ORAL | 0 refills | Status: DC

## 2020-04-12 NOTE — Addendum Note (Signed)
Addended by: Caryl Pina on: 04/12/2020 11:06 AM   Modules accepted: Orders

## 2020-04-21 ENCOUNTER — Other Ambulatory Visit: Payer: Self-pay | Admitting: Family Medicine

## 2020-04-21 DIAGNOSIS — M48062 Spinal stenosis, lumbar region with neurogenic claudication: Secondary | ICD-10-CM

## 2020-05-07 ENCOUNTER — Ambulatory Visit (INDEPENDENT_AMBULATORY_CARE_PROVIDER_SITE_OTHER): Payer: Medicare Other

## 2020-05-07 ENCOUNTER — Other Ambulatory Visit: Payer: Self-pay

## 2020-05-07 ENCOUNTER — Encounter: Payer: Self-pay | Admitting: Family Medicine

## 2020-05-07 ENCOUNTER — Ambulatory Visit (INDEPENDENT_AMBULATORY_CARE_PROVIDER_SITE_OTHER): Payer: Medicare Other | Admitting: Family Medicine

## 2020-05-07 VITALS — BP 146/59 | HR 81 | Temp 97.3°F | Ht 67.0 in | Wt 208.2 lb

## 2020-05-07 DIAGNOSIS — M25561 Pain in right knee: Secondary | ICD-10-CM

## 2020-05-07 DIAGNOSIS — S83101A Unspecified subluxation of right knee, initial encounter: Secondary | ICD-10-CM

## 2020-05-07 DIAGNOSIS — M1711 Unilateral primary osteoarthritis, right knee: Secondary | ICD-10-CM | POA: Diagnosis not present

## 2020-05-07 MED ORDER — DICLOFENAC SODIUM 1 % EX GEL: 4.0000 g | Freq: Four times a day (QID) | CUTANEOUS | 0 refills | Status: DC

## 2020-05-07 NOTE — Patient Instructions (Signed)
Let me know if you want me to place a new referral to Dr Charlestine Night office.  Acute Knee Pain, Adult Many things can cause knee pain. Sometimes, knee pain is sudden (acute) and may be caused by damage, swelling, or irritation of the muscles and tissues that support your knee. The pain often goes away on its own with time and rest. If the pain does not go away, tests may be done to find out what is causing the pain. Follow these instructions at home: Pay attention to any changes in your symptoms. Take these actions to relieve your pain. If you have a knee sleeve or brace:   Wear the sleeve or brace as told by your doctor. Remove it only as told by your doctor.  Loosen the sleeve or brace if your toes: ? Tingle. ? Become numb. ? Turn cold and blue.  Keep the sleeve or brace clean.  If the sleeve or brace is not waterproof: ? Do not let it get wet. ? Cover it with a watertight covering when you take a bath or shower. Activity  Rest your knee.  Do not do things that cause pain.  Avoid activities where both feet leave the ground at the same time (high-impact activities). Examples are running, jumping rope, and doing jumping jacks.  Work with a physical therapist to make a safe exercise program, as told by your doctor. Managing pain, stiffness, and swelling   If told, put ice on the knee: ? Put ice in a plastic bag. ? Place a towel between your skin and the bag. ? Leave the ice on for 20 minutes, 2-3 times a day.  If told, put pressure (compression) on your injured knee to control swelling, give support, and help with discomfort. Compression may be done with an elastic bandage. General instructions  Take all medicines only as told by your doctor.  Raise (elevate) your knee while you are sitting or lying down. Make sure your knee is higher than your heart.  Sleep with a pillow under your knee.  Do not use any products that contain nicotine or tobacco. These include cigarettes,  e-cigarettes, and chewing tobacco. These products may slow down healing. If you need help quitting, ask your doctor.  If you are overweight, work with your doctor and a food expert (dietitian) to set goals to lose weight. Being overweight can make your knee hurt more.  Keep all follow-up visits as told by your doctor. This is important. Contact a doctor if:  The knee pain does not stop.  The knee pain changes or gets worse.  You have a fever along with knee pain.  Your knee feels warm when you touch it.  Your knee gives out or locks up. Get help right away if:  Your knee swells, and the swelling gets worse.  You cannot move your knee.  You have very bad knee pain. Summary  Many things can cause knee pain. The pain often goes away on its own with time and rest.  Your doctor may do tests to find out the cause of the pain.  Pay attention to any changes in your symptoms. Relieve your pain with rest, medicines, light activity, and use of ice.  Get help right away if you cannot move your knee or your knee pain is very bad. This information is not intended to replace advice given to you by your health care provider. Make sure you discuss any questions you have with your health care provider. Document  Revised: 11/24/2017 Document Reviewed: 11/24/2017 Elsevier Patient Education  [place].

## 2020-05-07 NOTE — Progress Notes (Signed)
Subjective: CC: Right knee pain PCP: [name], DO HUT:MLYY Brenda Solis is a 85 y.o. female presenting to clinic today for:  1.  Right knee pain Patient reports abrupt onset of right-sided knee pain about 2 to 3 weeks ago.  Is gotten progressively worse such that she is now needing a rolling walker for ambulation.  She denies any preceding injury but does note history of meniscal repair on that side by Dr. Gladstone Lighter several years ago.  She reports popping sensation of the knee.  There is pain across the whole anterior knee that seems to radiate down the leg.  She reports sensation of instability and like it wants to buckle from underneath her.  She had mild swelling but no heat or redness.  She has been applying heat which does seem to help some as well as icy hot.  No sensory changes   ROS: Per HPI  Allergies  Allergen Reactions  . Gabapentin Other (See Comments)    Dizzy  . Oxycodone Itching   Past Medical History:  Diagnosis Date  . Depression   . Heart murmur   . Hyperlipidemia   . Hypertension   . Hypothyroidism   . Insomnia, unspecified   . Spondylosis of unspecified site without mention of myelopathy   . Thyroid disease    hypothyroidism    Current Outpatient Medications:  .  buPROPion (WELLBUTRIN XL) 300 MG 24 hr tablet, Take 1 tablet (300 mg total) by mouth daily., Disp: 30 tablet, Rfl: 0 .  cholecalciferol (VITAMIN D) 1000 UNITS tablet, Take 2,000 Units by mouth daily.  , Disp: , Rfl:  .  DULoxetine (CYMBALTA) 30 MG capsule, Take 1 capsule (30 mg total) by mouth daily. CANCEL 20mg ., Disp: 90 capsule, Rfl: 3 .  levothyroxine (SYNTHROID) 75 MCG tablet, TAKE 1 TABLET BY MOUTH EVERY DAY BEFORE BREAKFAST, Disp: 90 tablet, Rfl: 3 .  meclizine (ANTIVERT) 25 MG tablet, TAKE ONE TABLET BY MOUTH THREE TIMES DAILY AS NEEDED FOR DIZZINESS, Disp: 30 tablet, Rfl: 2 .  tizanidine (ZANAFLEX) 2 MG capsule, Take 1 capsule (2 mg total) by mouth 3 (three) times daily., Disp:  ,  Rfl:  .  traZODone (DESYREL) 50 MG tablet, TAKE 1-2 TABLETS (50-100 MG TOTAL) BY MOUTH AT BEDTIME AS NEEDED FOR SLEEP., Disp: 180 tablet, Rfl: 1 .  azithromycin (ZITHROMAX) 250 MG tablet, Take 2 the first day and then one each day after. (Patient not taking: Reported on 05/07/2020), Disp: 6 tablet, Rfl: 0 .  predniSONE (STERAPRED UNI-PAK 21 TAB) 10 MG (21) TBPK tablet, As directed x 6 days (Patient not taking: Reported on 05/07/2020), Disp: 21 tablet, Rfl: 0 Social History   Socioeconomic History  . Marital status: Widowed    Spouse name: Not on file  . Number of children: 3  . Years of education: Not on file  . 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 Smoking status: Never Smoker  . Smokeless tobacco: Never Used  Vaping Use  . Vaping Use: Never used  Substance and Sexual Activity  . Alcohol use: No    Alcohol/week: 0.0 standard drinks  . Drug use: No  . Sexual activity: Not Currently  Other Topics Concern  . Not on file  Social History Narrative  . Not on file   Social Determinants of Health   Financial Resource Strain:   . Difficulty of Paying Living  Expenses: Not on file  Food Insecurity:   . Worried About Charity fundraiser in the Last Year: Not on file  . Ran Out of Food in the Last Year: Not on file  Transportation Needs:   . Lack of Transportation (Medical): Not on file  . Lack of Transportation (Non-Medical): Not on file  Physical Activity:   . Days of Exercise per Week: Not on file  . Minutes of Exercise per Session: Not on file  Stress:   . Feeling of Stress : Not on file  Social Connections:   . Frequency of Communication with Friends and Family: Not on file  . Frequency of Social Gatherings with Friends and Family: Not on file  . Attends Religious Services: Not on file  . Active Member of Clubs or Organizations: Not on file  . Attends Archivist Meetings:  Not on file  . Marital Status: Not on file  Intimate Partner Violence:   . Fear of Current or Ex-Partner: Not on file  . Emotionally Abused: Not on file  . Physically Abused: Not on file  . Sexually Abused: Not on file   Family History  Problem Relation Age of Onset  . Coronary artery disease Brother        5s  . Heart disease Brother   . Cancer Mother        breast  . Osteoporosis Mother   . Heart disease Mother   . Osteoporosis Maternal Aunt   . Heart disease Father   . Stroke Father   . Esophageal varices Brother   . Stroke Other        FAMILY  . Esophageal varices Other        FAMILY    Objective: Office vital signs reviewed. BP (!) 146/59   Pulse 81   Temp (!) 97.3 F (36.3 C)   Ht 5\' 7"  (1.702 m)   Wt 208 lb 3.2 oz (94.4 kg)   SpO2 97%   BMI 32.61 kg/m   Physical Examination:  General: Awake, alert, well nourished, No acute distress HEENT: Normal MSK: Antalgic gait and station; unsteady gait requiring rolling walker for stabilization  Right knee: Patient has full active range of motion but there is a palpable clunk when she extended her knee. ?  Subluxation?  She has no tenderness to the patella, patellar tendon or quad tendon.  There is tenderness to palpation along the joint line and lateral knee.  Positive Thessaly maneuver. Skin: dry; intact; no rashes or lesions Neuro: Light touch sensation grossly intact of the lower extremities  Assessment/ Plan: 85 y.o. female   1. Subluxation of right knee, initial encounter Question that she has a subluxing joint?  Initially thought this to be meniscal injury but her exam was remarkable for a palpable clunk of her knee that was not quite consistent with a typical crepitus.  I am obtaining plain films.  Have given her sample of topical Voltaren gel.  She is not a candidate for oral NSAIDs and recently completed a steroid Dosepak.  I did offer referral to physical therapy and orthopedics but she declined this today.   Home physical therapy was provided.  If no significant improvement or if ongoing worsening over the next 2 weeks, highly recommended that we get her to the specialist.  She voiced good understanding of the plan will follow up as needed - DG Knee 1-2 Views Right; Future  2. Acute pain of right knee - DG Knee 1-2  Views Right; Future   No orders of the defined types were placed in this encounter.  Meds ordered this encounter  Medications  . diclofenac Sodium (VOLTAREN) 1 % GEL    Sig: Apply 4 g topically 4 (four) times daily.    Dispense:  2 g    Refill:  [place], DO [place] [phone]

## 2020-05-08 ENCOUNTER — Telehealth: Payer: Self-pay

## 2020-05-08 NOTE — Telephone Encounter (Signed)
Please see my note about this: 1. Subluxation of right knee, initial encounter Question that she has a subluxing joint?  Initially thought this to be meniscal injury but her exam was remarkable for a palpable clunk of her knee that was not quite consistent with a typical crepitus.  I am obtaining plain films.  Have given her sample of topical Voltaren gel.  She is not a candidate for oral NSAIDs and recently completed a steroid Dosepak.  I did offer referral to physical therapy and orthopedics but she declined this today.  Home physical therapy was provided.  If no significant improvement or if ongoing worsening over the next 2 weeks, highly recommended that we get her to the specialist.  She voiced good understanding of the plan will follow up as needed - DG Knee 1-2 Views Right; Future

## 2020-05-08 NOTE — Telephone Encounter (Signed)
Daughter in law aware  

## 2020-05-10 DIAGNOSIS — M546 Pain in thoracic spine: Secondary | ICD-10-CM | POA: Diagnosis not present

## 2020-05-16 DIAGNOSIS — M545 Low back pain, unspecified: Secondary | ICD-10-CM | POA: Diagnosis not present

## 2020-05-26 ENCOUNTER — Ambulatory Visit: Payer: Self-pay | Admitting: Orthopedic Surgery

## 2020-05-27 ENCOUNTER — Other Ambulatory Visit: Payer: Self-pay | Admitting: *Deleted

## 2020-05-27 ENCOUNTER — Other Ambulatory Visit: Payer: Self-pay

## 2020-05-27 ENCOUNTER — Encounter: Payer: Self-pay | Admitting: Nurse Practitioner

## 2020-05-27 ENCOUNTER — Ambulatory Visit (INDEPENDENT_AMBULATORY_CARE_PROVIDER_SITE_OTHER): Payer: Medicare Other | Admitting: Nurse Practitioner

## 2020-05-27 VITALS — BP 132/62 | HR 78 | Temp 97.5°F | Resp 20 | Ht 67.0 in | Wt 207.0 lb

## 2020-05-27 DIAGNOSIS — I10 Essential (primary) hypertension: Secondary | ICD-10-CM

## 2020-05-27 DIAGNOSIS — M479 Spondylosis, unspecified: Secondary | ICD-10-CM

## 2020-05-27 DIAGNOSIS — Z23 Encounter for immunization: Secondary | ICD-10-CM | POA: Diagnosis not present

## 2020-05-27 DIAGNOSIS — Z01818 Encounter for other preprocedural examination: Secondary | ICD-10-CM

## 2020-05-27 NOTE — Progress Notes (Signed)
Established Patient Office Visit  Subjective:  Patient ID: Brenda Solis, female    DOB: 1934/11/17  Age: 85 y.o. MRN: [id number]  CC:  Chief Complaint  Patient presents with  . Medical Clearance    for spinal stimulator implant - Dr Rolena Infante     HPI [name] presents for surgical/preop clearance, patient will be having spinal implant insertion procedure.  Patient is scheduled for preop with surgeon at [facility] 06/16/2020.  Last EKG completed 10/31/2019    Past Medical History:  Diagnosis Date  . Depression   . Heart murmur   . Hyperlipidemia   . Hypertension   . Hypothyroidism   . Insomnia, unspecified   . Spondylosis of unspecified site without mention of myelopathy   . Thyroid disease    hypothyroidism    Past Surgical History:  Procedure Laterality Date  . APPENDECTOMY    . CATARACT EXTRACTION, BILATERAL Bilateral 2006  . [place]  . [place]  . CHOLECYSTECTOMY  1990  . DECOMPRESSIVE LUMBAR LAMINECTOMY LEVEL 1 N/A 02/01/2013   Procedure: CENTRAL DECOMPRESSIVE LUMBAR LAMINECTOMY LEVEL 1 L4-L5;  Surgeon: Tobi Bastos, MD;  Location: [facility];  Service: Orthopedics;  Laterality: N/A;  . DECOMPRESSIVE LUMBAR LAMINECTOMY LEVEL 1 N/A 11/20/2014   Procedure: REVISION L4-S1 DECOMPRESSION, L4-L5 IN SITU FUSION (LEVEL 1);  Surgeon: Melina Schools, MD;  Location: [place];  Service: Orthopedics;  Laterality: N/A;  . KNEE SURGERY Right 2009   meniscus repair  . TOTAL HIP ARTHROPLASTY  2011   RIGHT    Family History  Problem Relation Age of Onset  . Coronary artery disease Brother        36s  . Heart disease Brother   . Cancer Mother        breast  . Osteoporosis Mother   . Heart disease Mother   . Osteoporosis Maternal Aunt   . Heart disease Father   . Stroke Father   . Esophageal varices Brother   . Stroke Other        FAMILY  . Esophageal varices Other        FAMILY    Social History   Socioeconomic History  .  Marital status: Widowed    Spouse name: Not on file  . Number of children: 3  . Years of education: Not on file  . 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 Smoking status: Never Smoker  . Smokeless tobacco: Never Used  Vaping Use  . Vaping Use: Never used  Substance and Sexual Activity  . Alcohol use: No    Alcohol/week: 0.0 standard drinks  . Drug use: No  . Sexual activity: Not Currently  Other Topics Concern  . Not on file  Social History Narrative  . Not on file   Social Determinants of Health   Financial Resource Strain:   . Difficulty of Paying Living Expenses: Not on file  Food Insecurity:   . Worried About Charity fundraiser in the Last Year: Not on file  . Ran Out of Food in the Last Year: Not on file  Transportation Needs:   . Lack of Transportation (Medical): Not on file  . Lack of Transportation (Non-Medical): Not on file  Physical Activity:   . Days of Exercise per Week: Not on file  . Minutes of Exercise per  Session: Not on file  Stress:   . Feeling of Stress : Not on file  Social Connections:   . Frequency of Communication with Friends and Family: Not on file  . Frequency of Social Gatherings with Friends and Family: Not on file  . Attends Religious Services: Not on file  . Active Member of Clubs or Organizations: Not on file  . Attends Archivist Meetings: Not on file  . Marital Status: Not on file  Intimate Partner Violence:   . Fear of Current or Ex-Partner: Not on file  . Emotionally Abused: Not on file  . Physically Abused: Not on file  . Sexually Abused: Not on file    Outpatient Medications Prior to Visit  Medication Sig Dispense Refill  . buPROPion (WELLBUTRIN XL) 300 MG 24 hr tablet Take 1 tablet (300 mg total) by mouth daily. 30 tablet 0  . cholecalciferol (VITAMIN D) 1000 UNITS tablet Take 2,000 Units by mouth daily.      . diclofenac  Sodium (VOLTAREN) 1 % GEL Apply 4 g topically 4 (four) times daily. 2 g 0  . DULoxetine (CYMBALTA) 30 MG capsule Take 1 capsule (30 mg total) by mouth daily. CANCEL 20mg . 90 capsule 3  . levothyroxine (SYNTHROID) 75 MCG tablet TAKE 1 TABLET BY MOUTH EVERY DAY BEFORE BREAKFAST 90 tablet 3  . meclizine (ANTIVERT) 25 MG tablet TAKE ONE TABLET BY MOUTH THREE TIMES DAILY AS NEEDED FOR DIZZINESS 30 tablet 2  . tizanidine (ZANAFLEX) 2 MG capsule Take 1 capsule (2 mg total) by mouth 3 (three) times daily.    . traMADol (ULTRAM) 50 MG tablet Take 50 mg by mouth 3 (three) times daily as needed.    . traZODone (DESYREL) 50 MG tablet TAKE 1-2 TABLETS (50-100 MG TOTAL) BY MOUTH AT BEDTIME AS NEEDED FOR SLEEP. 180 tablet 1   No facility-administered medications prior to visit.    Allergies  Allergen Reactions  . Gabapentin Other (See Comments)    Dizzy  . Oxycodone Itching    ROS Review of Systems  All other systems reviewed and are negative.     Objective:    Physical Exam Vitals reviewed.  Constitutional:      General: She is awake.     Appearance: Normal appearance. She is overweight. She is not ill-appearing.  HENT:     Nose: No congestion.  Eyes:     Conjunctiva/sclera: Conjunctivae normal.  Cardiovascular:     Rate and Rhythm: Normal rate.     Comments: History of heart murmur Pulmonary:     Effort: Pulmonary effort is normal.     Breath sounds: Normal breath sounds.  Abdominal:     General: Bowel sounds are normal.  Musculoskeletal:        General: Normal range of motion.  Neurological:     Mental Status: She is alert and oriented to person, place, and time.  Psychiatric:        Mood and Affect: Mood normal.        Behavior: Behavior normal. Behavior is cooperative.     BP 132/62   Pulse 78   Temp (!) 97.5 F (36.4 C)   Resp 20   Ht 5\' 7"  (1.702 m)   Wt 207 lb (93.9 kg)   SpO2 99%   BMI 32.42 kg/m  Wt Readings from Last 3 Encounters:  05/27/20 207 lb (93.9 kg)   05/07/20 208 lb 3.2 oz (94.4 kg)  03/07/20 206 lb (93.4 kg)  Lab Results  Component Value Date   TSH 3.820 01/22/2020   Lab Results  Component Value Date   WBC 7.2 01/22/2020   HGB 12.6 01/22/2020   HCT 38.2 01/22/2020   MCV 93 01/22/2020   PLT 204 01/22/2020   Lab Results  Component Value Date   NA 139 01/22/2020   K 5.0 01/22/2020   CO2 23 01/22/2020   GLUCOSE 74 01/22/2020   BUN 18 01/22/2020   CREATININE 1.01 (H) 01/22/2020   BILITOT 0.3 01/22/2020   ALKPHOS 48 01/22/2020   AST 21 01/22/2020   ALT 17 01/22/2020   PROT 6.8 01/22/2020   ALBUMIN 4.7 (H) 01/22/2020   CALCIUM 9.9 01/22/2020   ANIONGAP 6 07/28/2015   Lab Results  Component Value Date   CHOL 181 11/03/2018   Lab Results  Component Value Date   HDL 67 11/03/2018   Lab Results  Component Value Date   LDLCALC 82 11/03/2018   Lab Results  Component Value Date   TRIG 162 (H) 11/03/2018   Lab Results  Component Value Date   CHOLHDL 2.7 11/03/2018   No results found for: HGBA1C    Assessment & Plan:   Problem List Items Addressed This Visit      Cardiovascular and Mediastinum   Hypertension    Hypertension well controlled.  No changes necessary, continue healthy diet and exercise regimen as tolerated.         Musculoskeletal and Integument   Degenerative arthritis of spine    Patient scheduled for surgery [spinal implant insertion].  Preop with surgeon at [place] scheduled for 12/20.  Completed preop clearance.  EKG completed, checks x-ray pending, labs scheduled for 12/20. Paperwork faxed.       Relevant Medications   traMADol (ULTRAM) 50 MG tablet     Other   Need for immunization against influenza - Primary   Relevant Orders   Flu Vaccine QUAD High Dose(Fluad) (Completed)   Preoperative clearance   Relevant Orders   EKG 12-Lead (Completed)      No orders of the defined types were placed in this encounter.   Follow-up: Return if symptoms worsen or fail  to improve.    Ivy Lynn, NP

## 2020-05-27 NOTE — Assessment & Plan Note (Addendum)
Patient scheduled for surgery [spinal implant insertion].  Preop with surgeon at [facility] scheduled for 12/20.  Completed preop clearance.  EKG completed, checks x-ray pending, labs scheduled for 12/20. Paperwork faxed.

## 2020-05-27 NOTE — Assessment & Plan Note (Signed)
Hypertension well controlled.  No changes necessary, continue healthy diet and exercise regimen as tolerated.

## 2020-06-17 ENCOUNTER — Ambulatory Visit: Payer: Self-pay | Admitting: Orthopedic Surgery

## 2020-06-17 NOTE — H&P (Signed)
Subjective:   Brenda Solis is a pleasant 85 year old female with chronic back pain and leg pain that significant relief following a spinal cord stimulator trial. And therefore, after discussing risks and benefits of surgery she has elected to move forward with a SCS placement At [facility] On 06/25/20 With Dr. Rolena Infante.  Patient Active Problem List   Diagnosis Date Noted  . Need for immunization against influenza 05/27/2020  . Preoperative clearance 05/27/2020  . Bronchitis, acute, with bronchospasm 04/11/2020  . Closed fracture of base of distal phalanx of right thumb 11/03/2016  . Hypokalemia 07/26/2015  . Hypomagnesemia 07/26/2015  . Spinal stenosis 11/20/2014  . Obsessional thoughts   . Mood disorder ([place])   . Osteopenia 04/24/2014  . Hyperlipidemia with target LDL less than 100 02/01/2014  . Depression 02/01/2014  . Spinal stenosis, lumbar region, with neurogenic claudication 02/01/2013  . Syncope 11/17/2010  . Hypertension 11/17/2010  . Hypothyroidism 10/14/2010  . Insomnia 10/14/2010  . Spondylosis 10/14/2010  . Degenerative arthritis of spine 10/14/2010  . Gastritis 10/14/2010   Past Medical History:  Diagnosis Date  . Depression   . Heart murmur   . Hyperlipidemia   . Hypertension   . Hypothyroidism   . Insomnia, unspecified   . Spondylosis of unspecified site without mention of myelopathy   . Thyroid disease    hypothyroidism    Past Surgical History:  Procedure Laterality Date  . APPENDECTOMY    . CATARACT EXTRACTION, BILATERAL Bilateral 2006  . [place]  . [place]  . CHOLECYSTECTOMY  1990  . DECOMPRESSIVE LUMBAR LAMINECTOMY LEVEL 1 N/A 02/01/2013   Procedure: CENTRAL DECOMPRESSIVE LUMBAR LAMINECTOMY LEVEL 1 L4-L5;  Surgeon: Tobi Bastos, MD;  Location: [facility];  Service: Orthopedics;  Laterality: N/A;  . DECOMPRESSIVE LUMBAR LAMINECTOMY LEVEL 1 N/A 11/20/2014   Procedure: REVISION L4-S1 DECOMPRESSION, L4-L5 IN SITU FUSION  (LEVEL 1);  Surgeon: Melina Schools, MD;  Location: [place];  Service: Orthopedics;  Laterality: N/A;  . KNEE SURGERY Right 2009   meniscus repair  . TOTAL HIP ARTHROPLASTY  2011   RIGHT    Current Outpatient Medications  Medication Sig Dispense Refill Last Dose  . ascorbic acid (VITAMIN C) 500 MG tablet Take 500 mg by mouth daily.     Marland Kitchen buPROPion (WELLBUTRIN XL) 300 MG 24 hr tablet Take 1 tablet (300 mg total) by mouth daily. 30 tablet 0   . cholecalciferol (VITAMIN D) 1000 UNITS tablet Take 2,000 Units by mouth daily.     . diclofenac Sodium (VOLTAREN) 1 % GEL Apply 4 g topically 4 (four) times daily. (Patient not taking: No sig reported) 2 g 0   . DULoxetine (CYMBALTA) 30 MG capsule Take 1 capsule (30 mg total) by mouth daily. CANCEL 20mg . 90 capsule 3   . levothyroxine (SYNTHROID) 75 MCG tablet TAKE 1 TABLET BY MOUTH EVERY DAY BEFORE BREAKFAST (Patient taking differently: Take 75 mcg by mouth daily before breakfast. TAKE 1 TABLET BY MOUTH EVERY DAY BEFORE BREAKFAST) 90 tablet 3   . Magnesium 200 MG TABS Take 400 mg by mouth daily.     . meclizine (ANTIVERT) 25 MG tablet TAKE ONE TABLET BY MOUTH THREE TIMES DAILY AS NEEDED FOR DIZZINESS (Patient taking differently: Take 25 mg by mouth 3 (three) times daily as needed for dizziness. TAKE ONE TABLET BY MOUTH THREE TIMES DAILY AS NEEDED FOR DIZZINESS) 30 tablet 2   . traMADol (ULTRAM) 50 MG tablet Take 50 mg by  mouth 3 (three) times daily as needed for severe pain.     . traZODone (DESYREL) 50 MG tablet TAKE 1-2 TABLETS (50-100 MG TOTAL) BY MOUTH AT BEDTIME AS NEEDED FOR SLEEP. (Patient taking differently: Take 100 mg by mouth at bedtime.) 180 tablet 1   . vitamin B-12 (CYANOCOBALAMIN) 1000 MCG tablet Take 1,000 mcg by mouth daily.     Marland Kitchen zinc gluconate 50 MG tablet Take 50 mg by mouth daily.      No current facility-administered medications for this visit.   Allergies  Allergen Reactions  . Gabapentin Other (See Comments)    Dizzy  . Oxycodone  Itching    Social History   Tobacco Use  . Smoking status: Never Smoker  . Smokeless tobacco: Never Used  Substance Use Topics  . Alcohol use: No    Alcohol/week: 0.0 standard drinks    Family History  Problem Relation Age of Onset  . Coronary artery disease Brother        42s  . Heart disease Brother   . Cancer Mother        breast  . Osteoporosis Mother   . Heart disease Mother   . Osteoporosis Maternal Aunt   . Heart disease Father   . Stroke Father   . Esophageal varices Brother   . Stroke Other        FAMILY  . Esophageal varices Other        FAMILY    Review of Systems As stated in HPI  Objective:   Vitals: Ht: 5 ft 6.5 in 06/16/2020 04:18 pm BP: 120/70 L arm 06/16/2020 04:20 pm Pulse: 84 bpm 06/16/2020 04:21 pm  Clinical exam: Vernie is a pleasant individual, who appears younger than their stated age. She is alert and orientated 3. No shortness of breath, chest pain.  Heart: Regular rate and rhythm, no rubs, murmurs, or gallops  Lungs: Clear to rotation bilaterally  Abdomen is soft and non-tender, negative loss of bowel and bladder control, no rebound tenderness. Bowel sounds 4  Negative: skin lesions abrasions contusions Peripheral pulses: 1+ dorsalis pedis/posterior tibialis pulses bilaterally. Compartment soft and nontender. Gait pattern: Slightly unsteady gait pattern due to pain. Assistive devices: Cane  Neuro: 5/5 motor strength in the lower extremity. Intermittent dysesthesias in the lower extremity but sensation light touch is intact. Negative Babinski test, 1+ deep tendon reflexes at the knee and absent at the Achilles.  Musculoskeletal: Significant low back pain with well-healed surgical scar from prior surgical intervention. No SI joint pain. Patient has some difficulty with range of motion but her lumbar range of motion is intact. No significant hip, knee and ankle pain with isolated joint range of motion  MRI of the thoracic spine completed  on 05/10/20 at [facility] was reviewed. I agree with the radiology report. Unremarkable MRI with no significant stenosis or abnormality that would be a contraindication for implantation of a spinal cord stimulator.  Assessment:   Selyna is a very pleasant 85 year old woman who presents with chronic low back pain which was improved with a recent spinal cord stimulator trial. The patient presents today expressing a desire to move forward with permanent implantation. I have gone over the surgical procedure with the patient and her daughter and all of their questions were encouraged and addressed. We have also discussed the risks and benefits of surgery.  Plan:   Risks and benefits of surgery were discussed with the patient. These include: Infection, bleeding, death, stroke, paralysis, ongoing or worse pain,  need for additional surgery, leak of spinal fluid, Failure of the battery requiring reoperation. Inability to place the paddle requiring the surgery to be aborted. Migration of the lead, failure to obtain results similar to the trial.  Please obtain preoperative medical clearance from the patient's primary care provider.  I have reviewed the patient's medication list with her. She is not on any blood thinners. Not using any aspirin products. Not taking any anti-inflammatory medications. Advised the patient to avoid any anti-inflammatory medications leading up to surgical intervention.  We have also discussed the post-operative recovery period to include: bathing/showering restrictions, wound healing, activity (and driving) restrictions, medications/pain mangement.  We have also discussed post-operative redflags to include: signs and symptoms of postoperative infection, DVT/PE.  Follow-up: 2 weeks postop

## 2020-06-19 NOTE — Pre-Procedure Instructions (Signed)
Your procedure is scheduled on Wednesday, December 29th at 10:41a.m.  Report to [facility] Main Entrance "A" at 8:30 A.M., and check in at the Admitting office.  Call this number if you have problems the morning of surgery:  [phone]  Call [phone] if you have any questions prior to your surgery date Monday-Friday 8am-4pm    Remember:  Do not eat or drink after midnight the night before your surgery    Take these medicines the morning of surgery with A SIP OF WATER  buPROPion (WELLBUTRIN XL)  DULoxetine (CYMBALTA)  levothyroxine (SYNTHROID)   Take these medications the morning of surgery as needed  meclizine (ANTIVERT) traMADol (ULTRAM) As of today, STOP taking any Aspirin (unless otherwise instructed by your surgeon) Aleve, Naproxen, Ibuprofen, Motrin, Advil, Goody's, BC's, all herbal medications, fish oil, and all vitamins.                      Do not wear jewelry, make up, or nail polish            Do not wear lotions, powders, perfumes, or deodorant.            Do not shave 48 hours prior to surgery.             Do not bring valuables to the hospital.            [facility] is not responsible for any belongings or valuables.  Do NOT Smoke (Tobacco/Vaping) or drink Alcohol 24 hours prior to your procedure If you use a CPAP at night, you may bring all equipment for your overnight stay.   Contacts, glasses, dentures or bridgework may not be worn into surgery.      For patients admitted to the hospital, discharge time will be determined by your treatment team.   Patients discharged the day of surgery will not be allowed to drive home, and someone needs to stay with them for 24 hours.    Special instructions:   [place]- Preparing For Surgery  Before surgery, you can play an important role. Because skin is not sterile, your skin needs to be as free of germs as possible. You can reduce the number of germs on your skin by washing with CHG (chlorahexidine gluconate)  Soap before surgery.  CHG is an antiseptic cleaner which kills germs and bonds with the skin to continue killing germs even after washing.    Oral Hygiene is also important to reduce your risk of infection.  Remember - BRUSH YOUR TEETH THE MORNING OF SURGERY WITH YOUR REGULAR TOOTHPASTE  Please do not use if you have an allergy to CHG or antibacterial soaps. If your skin becomes reddened/irritated stop using the CHG.  Do not shave (including legs and underarms) for at least 48 hours prior to first CHG shower. It is OK to shave your face.  Please follow these instructions carefully.   1. Shower the NIGHT BEFORE SURGERY and the MORNING OF SURGERY with CHG Soap.   2. If you chose to wash your hair, wash your hair first as usual with your normal shampoo.  3. After you shampoo, rinse your hair and body thoroughly to remove the shampoo.  4. Use CHG as you would any other liquid soap. You can apply CHG directly to the skin and wash gently with a scrungie or a clean washcloth.   5. Apply the CHG Soap to your body ONLY FROM THE NECK DOWN.  Do not use on  open wounds or open sores. Avoid contact with your eyes, ears, mouth and genitals (private parts). Wash Face and genitals (private parts)  with your normal soap.   6. Wash thoroughly, paying special attention to the area where your surgery will be performed.  7. Thoroughly rinse your body with warm water from the neck down.  8. DO NOT shower/wash with your normal soap after using and rinsing off the CHG Soap.  9. Pat yourself dry with a CLEAN TOWEL.  10. Wear CLEAN PAJAMAS to bed the night before surgery  11. Place CLEAN SHEETS on your bed the night of your first shower and DO NOT SLEEP WITH PETS.   Day of Surgery: Wear Clean/Comfortable clothing the morning of surgery Do not apply any deodorants/lotions.   Remember to brush your teeth WITH YOUR REGULAR TOOTHPASTE.   Please read over the following fact sheets that you were  given.

## 2020-06-23 ENCOUNTER — Encounter (HOSPITAL_COMMUNITY)
Admission: RE | Admit: 2020-06-23 | Discharge: 2020-06-23 | Disposition: A | Discharge status: Home / Self Care | Payer: Medicare Other | Source: Ambulatory Visit

## 2020-06-23 ENCOUNTER — Other Ambulatory Visit (HOSPITAL_COMMUNITY)
Admission: RE | Admit: 2020-06-23 | Discharge: 2020-06-23 | Disposition: A | Discharge status: Home / Self Care | Payer: Medicare Other | Source: Ambulatory Visit | Attending: Orthopedic Surgery | Admitting: Orthopedic Surgery

## 2020-06-23 ENCOUNTER — Encounter (HOSPITAL_COMMUNITY): Payer: Self-pay

## 2020-06-23 ENCOUNTER — Other Ambulatory Visit: Payer: Self-pay

## 2020-06-23 ENCOUNTER — Ambulatory Visit (HOSPITAL_COMMUNITY)
Admission: RE | Admit: 2020-06-23 | Discharge: 2020-06-23 | Disposition: A | Discharge status: Home / Self Care | Payer: Medicare Other | Source: Ambulatory Visit | Attending: Orthopedic Surgery | Admitting: Orthopedic Surgery

## 2020-06-23 DIAGNOSIS — Z79891 Long term (current) use of opiate analgesic: Secondary | ICD-10-CM | POA: Insufficient documentation

## 2020-06-23 DIAGNOSIS — Z791 Long term (current) use of non-steroidal anti-inflammatories (NSAID): Secondary | ICD-10-CM | POA: Insufficient documentation

## 2020-06-23 DIAGNOSIS — I10 Essential (primary) hypertension: Secondary | ICD-10-CM | POA: Insufficient documentation

## 2020-06-23 DIAGNOSIS — F32A Depression, unspecified: Secondary | ICD-10-CM | POA: Insufficient documentation

## 2020-06-23 DIAGNOSIS — M961 Postlaminectomy syndrome, not elsewhere classified: Secondary | ICD-10-CM | POA: Insufficient documentation

## 2020-06-23 DIAGNOSIS — M5137 Other intervertebral disc degeneration, lumbosacral region: Secondary | ICD-10-CM | POA: Insufficient documentation

## 2020-06-23 DIAGNOSIS — Z981 Arthrodesis status: Secondary | ICD-10-CM | POA: Insufficient documentation

## 2020-06-23 DIAGNOSIS — Z20822 Contact with and (suspected) exposure to covid-19: Secondary | ICD-10-CM | POA: Insufficient documentation

## 2020-06-23 DIAGNOSIS — Z79899 Other long term (current) drug therapy: Secondary | ICD-10-CM | POA: Insufficient documentation

## 2020-06-23 DIAGNOSIS — E039 Hypothyroidism, unspecified: Secondary | ICD-10-CM | POA: Insufficient documentation

## 2020-06-23 DIAGNOSIS — Z9049 Acquired absence of other specified parts of digestive tract: Secondary | ICD-10-CM | POA: Insufficient documentation

## 2020-06-23 DIAGNOSIS — M5136 Other intervertebral disc degeneration, lumbar region: Secondary | ICD-10-CM | POA: Insufficient documentation

## 2020-06-23 DIAGNOSIS — Z96641 Presence of right artificial hip joint: Secondary | ICD-10-CM | POA: Insufficient documentation

## 2020-06-23 DIAGNOSIS — M48061 Spinal stenosis, lumbar region without neurogenic claudication: Secondary | ICD-10-CM | POA: Insufficient documentation

## 2020-06-23 DIAGNOSIS — Z01818 Encounter for other preprocedural examination: Secondary | ICD-10-CM | POA: Insufficient documentation

## 2020-06-23 DIAGNOSIS — E785 Hyperlipidemia, unspecified: Secondary | ICD-10-CM | POA: Insufficient documentation

## 2020-06-23 DIAGNOSIS — Z79890 Hormone replacement therapy (postmenopausal): Secondary | ICD-10-CM | POA: Insufficient documentation

## 2020-06-23 DIAGNOSIS — G4700 Insomnia, unspecified: Secondary | ICD-10-CM | POA: Insufficient documentation

## 2020-06-23 LAB — BASIC METABOLIC PANEL
Anion gap: 10 (ref 5–15)
BUN: 17 mg/dL (ref 8–23)
CO2: 21 mmol/L — ABNORMAL LOW (ref 22–32)
Calcium: 10.1 mg/dL (ref 8.9–10.3)
Chloride: 107 mmol/L (ref 98–111)
Creatinine, Ser: 1.02 mg/dL — ABNORMAL HIGH (ref 0.44–1.00)
GFR, Estimated: 55 mL/min — ABNORMAL LOW (ref >60)
Glucose, Bld: 100 mg/dL — ABNORMAL HIGH (ref 70–99)
Potassium: 4.6 mmol/L (ref 3.5–5.1)
Sodium: 138 mmol/L (ref 135–145)

## 2020-06-23 LAB — CBC
HCT: 39.1 % (ref 36.0–46.0)
Hemoglobin: 13 g/dL (ref 12.0–15.0)
MCH: 31.1 pg (ref 26.0–34.0)
MCHC: 33.2 g/dL (ref 30.0–36.0)
MCV: 93.5 fL (ref 80.0–100.0)
Platelets: 268 10*3/uL (ref 150–400)
RBC: 4.18 MIL/uL (ref 3.87–5.11)
RDW: 13.9 % (ref 11.5–15.5)
WBC: 9.9 10*3/uL (ref 4.0–10.5)
nRBC: 0 % (ref 0.0–0.2)

## 2020-06-23 LAB — URINALYSIS, ROUTINE W REFLEX MICROSCOPIC
Bilirubin Urine: NEGATIVE
Glucose, UA: NEGATIVE mg/dL
Ketones, ur: NEGATIVE mg/dL
Nitrite: NEGATIVE
Protein, ur: NEGATIVE mg/dL
Specific Gravity, Urine: 1.016 (ref 1.005–1.030)
pH: 7 (ref 5.0–8.0)

## 2020-06-23 LAB — PROTIME-INR
INR: 1 (ref 0.8–1.2)
Prothrombin Time: 12.8 seconds (ref 11.4–15.2)

## 2020-06-23 LAB — SURGICAL PCR SCREEN
MRSA, PCR: NEGATIVE
Staphylococcus aureus: POSITIVE — AB

## 2020-06-23 LAB — APTT: aPTT: 27 seconds (ref 24–36)

## 2020-06-23 NOTE — Progress Notes (Signed)
PCP - Dr. Delynn Flavin Cardiologist - denies  Chest x-ray - 06/23/20 EKG - 05/27/20 Stress Test - 2012 ECHO - 2015 Cardiac Cath -denies   Sleep Study - denies CPAP - denies  Blood Thinner Instructions: n/a Aspirin Instructions:n/a  COVID TEST- 06/23/20-pending  Anesthesia review: Yes, per order.  Patient denies shortness of breath, fever, cough and chest pain at PAT appointment   All instructions explained to the patient, with a verbal understanding of the material. Patient agrees to go over the instructions while at home for a better understanding. Patient also instructed to self quarantine after being tested for COVID-19. The opportunity to ask questions was provided.    Coronavirus Screening  Have you experienced the following symptoms:  Cough yes/no: No Fever (>100.77F)  yes/no: No Runny nose yes/no: No Sore throat yes/no: No Difficulty breathing/shortness of breath  yes/no: No  Have you or a family member traveled in the last 14 days and where? yes/no: No   If the patient indicates "YES" to the above questions, their PAT will be rescheduled to limit the exposure to others and, the surgeon will be notified. THE PATIENT WILL NEED TO BE ASYMPTOMATIC FOR 14 DAYS.   If the patient is not experiencing any of these symptoms, the PAT nurse will instruct them to NOT bring anyone with them to their appointment since they may have these symptoms or traveled as well.   Please remind your patients and families that hospital visitation restrictions are in effect and the importance of the restrictions.

## 2020-06-23 NOTE — Anesthesia Preprocedure Evaluation (Addendum)
Anesthesia Evaluation  Patient identified by MRN, date of birth, ID band Patient awake    Reviewed: Allergy & Precautions, NPO status , Patient's Chart, lab work & pertinent test results  Airway Mallampati: II  TM Distance: >3 FB Neck ROM: Full    Dental   Pulmonary neg pulmonary ROS,    breath sounds clear to auscultation       Cardiovascular hypertension, Pt. on medications  Rhythm:Regular Rate:Normal     Neuro/Psych  Neuromuscular disease    GI/Hepatic negative GI ROS, Neg liver ROS,   Endo/Other  Hypothyroidism   Renal/GU negative Renal ROS     Musculoskeletal  (+) Arthritis ,   Abdominal   Peds  Hematology negative hematology ROS (+)   Anesthesia Other Findings   Reproductive/Obstetrics                            Anesthesia Physical Anesthesia Plan  ASA: III  Anesthesia Plan: General   Post-op Pain Management:    Induction: Intravenous  PONV Risk Score and Plan: 3 and Dexamethasone and Ondansetron  Airway Management Planned: Oral ETT  Additional Equipment:   Intra-op Plan:   Post-operative Plan: Extubation in OR  Informed Consent: I have reviewed the patients History and Physical, chart, labs and discussed the procedure including the risks, benefits and alternatives for the proposed anesthesia with the patient or authorized representative who has indicated his/her understanding and acceptance.     Dental advisory given  Plan Discussed with: CRNA  Anesthesia Plan Comments: (PAT note written 06/23/2020 by Shonna Chock, PA-C. )       Anesthesia Quick Evaluation

## 2020-06-23 NOTE — Progress Notes (Signed)
Anesthesia Chart Review:  Case: [id number] Date/Time: 06/25/20 1020   Procedure: SPINAL CORD STIMULATOR INSERTION (N/A ) - 3 hrs   Anesthesia type: General   Pre-op diagnosis: Failed back syndrome   [facility] RO[facility] / [facility]   Surgeons: Venita Lick, MD      DISCUSSION:  Patient is an 85 year old female scheduled for the above procedure.  History includes never smoker, HTN, HLD, murmur, hypothyroidism, insomnia, depression, , right THA (08/19/09), neck surgery (1999), back surgery (L3-5 laminectomy, L4-5 foraminotomies 02/01/13; L4-S1 decompression, in situ arthrodesis L4-5 11/21/14). BMI is consistent with obesity.  Patient evaluated 05/27/20 by Richmond Campbell, NP ([facility]) for medical clearance with EKG for this procedure. CXR and labs pending her PAT visit, but done on 06/23/20 and showed normal BMET, INR, Cr 1.02, glucose 100, no acute process on CXR. No murmurs documented 03/07/20 exam with Raliegh Ip, DO.  06/23/20 preoperative COVID-19 test in process. Anesthesia team to evaluate on the day of surgery.    VS: BP 130/80   Pulse 71   Temp 36.6 C (Oral)   Resp 18   Ht 5' 7.5" (1.715 m)   Wt 93.2 kg   SpO2 99%   BMI 31.70 kg/m     PROVIDERS: Raliegh Ip, DO is PCP ([facility]) - She is not routinely followed by cardiology, but had evaluation by Olga Millers, MD in 2012 for syncope of unclear etiology, possible vagal episode. Dobutamine stress echo and Cardionet unremarkable. No further work-up recommended at that time. No murmur auscultated at that time.   LABS: Labs reviewed: Acceptable for surgery. (all labs ordered are listed, but only abnormal results are displayed)  Labs Reviewed  BASIC METABOLIC PANEL - Abnormal; Notable for the following components:      Result Value   CO2 21 (*)    Glucose, Bld 100 (*)    Creatinine, Ser 1.02 (*)    GFR, Estimated 55 (*)    All other components within normal limits   URINALYSIS, ROUTINE W REFLEX MICROSCOPIC - Abnormal; Notable for the following components:   Hgb urine dipstick SMALL (*)    Leukocytes,Ua SMALL (*)    Bacteria, UA RARE (*)    All other components within normal limits  SURGICAL PCR SCREEN  CBC  PROTIME-INR  APTT     IMAGES: CXR 06/23/20: FINDINGS: No focal consolidation. No pneumothorax or pleural effusion. Cardiomediastinal silhouette is within normal limits. No acute osseous abnormality. IMPRESSION: No focal airspace disease.  MRI L-spine 08/31/19: IMPRESSION: 1. Postsurgical changes from L4-5 to L5-S1 with likely postsurgical fibrosis around the posterior aspect of the thecal sac at the L4-5 and L5-S1 levels. 2. Lower lumbar degenerative disc disease with high-grade neural foraminal narrowing at L3-L4, L4-L5, and L5-S1 levels. 3. No high-grade spinal canal stenosis.   EKG: 05/27/20: Sinus  Rhythm  Voltage criteria for LVH  (R(aVL) exceeds 1.01 mV).   -Prominent R(V1) and left axis -nonspecific  -Seen with pulmonary disease -possible anterior fascicular block.   -Anteroseptal infarct -age undetermined.  - Baseline wanderer in leads I, II, otherwise no significantly changed when compared to 10/31/19 tracing.    CV: Stress echo 11/30/10 (Scan from 11/29/13 is also the 2012 stress echo):  Stress echo results: Normal dobutamine echo. There is no evidence of inducible ischemia.  Cardiac event monitor 11/17/10-12/07/10: Sinus rhythm   Past Medical History:  Diagnosis Date  . Depression   . Heart murmur   .  Hyperlipidemia   . Hypertension   . Hypothyroidism   . Insomnia, unspecified   . Spondylosis of unspecified site without mention of myelopathy   . Thyroid disease    hypothyroidism    Past Surgical History:  Procedure Laterality Date  . APPENDECTOMY    . CATARACT EXTRACTION, BILATERAL Bilateral 2006  . [place]  . [place]  . CHOLECYSTECTOMY  1990  . DECOMPRESSIVE  LUMBAR LAMINECTOMY LEVEL 1 N/A 02/01/2013   Procedure: CENTRAL DECOMPRESSIVE LUMBAR LAMINECTOMY LEVEL 1 L4-L5;  Surgeon: Tobi Bastos, MD;  Location[facility];  Service: Orthopedics;  Laterality: N/A;  . DECOMPRESSIVE LUMBAR LAMINECTOMY LEVEL 1 N/A 11/20/2014   Procedure: REVISION L4-S1 DECOMPRESSION, L4-L5 IN SITU FUSION (LEVEL 1);  Surgeon: Melina Schools, MD;  Location: [place];  Service: Orthopedics;  Laterality: N/A;  . KNEE SURGERY Right 2009   meniscus repair  . TOTAL HIP ARTHROPLASTY  2011   RIGHT    MEDICATIONS: . ascorbic acid (VITAMIN C) 500 MG tablet  . buPROPion (WELLBUTRIN XL) 300 MG 24 hr tablet  . cholecalciferol (VITAMIN D) 1000 UNITS tablet  . diclofenac Sodium (VOLTAREN) 1 % GEL  . DULoxetine (CYMBALTA) 30 MG capsule  . levothyroxine (SYNTHROID) 75 MCG tablet  . Magnesium 200 MG TABS  . meclizine (ANTIVERT) 25 MG tablet  . traMADol (ULTRAM) 50 MG tablet  . traZODone (DESYREL) 50 MG tablet  . vitamin B-12 (CYANOCOBALAMIN) 1000 MCG tablet  . zinc gluconate 50 MG tablet   No current facility-administered medications for this encounter.    Myra Gianotti, PA-C Surgical Short Stay/Anesthesiology [facility] Phone [phone] [facility] Phone [phone] 06/23/2020 3:58 PM

## 2020-06-24 LAB — SARS CORONAVIRUS 2 (TAT 6-24 HRS): SARS Coronavirus 2: NEGATIVE

## 2020-06-25 ENCOUNTER — Observation Stay (HOSPITAL_COMMUNITY)
Admission: RE | Admit: 2020-06-25 | Discharge: 2020-06-26 | Disposition: A | Discharge status: Home / Self Care | Payer: Medicare Other | Attending: Orthopedic Surgery | Admitting: Orthopedic Surgery

## 2020-06-25 ENCOUNTER — Encounter (HOSPITAL_COMMUNITY): Payer: Self-pay | Admitting: Orthopedic Surgery

## 2020-06-25 ENCOUNTER — Ambulatory Visit (HOSPITAL_COMMUNITY): Payer: Medicare Other

## 2020-06-25 ENCOUNTER — Other Ambulatory Visit: Payer: Self-pay

## 2020-06-25 ENCOUNTER — Encounter (HOSPITAL_COMMUNITY)
Admission: RE | Disposition: A | Discharge status: Home / Self Care | Payer: Self-pay | Source: Home / Self Care | Attending: Orthopedic Surgery

## 2020-06-25 ENCOUNTER — Ambulatory Visit (HOSPITAL_COMMUNITY): Payer: Medicare Other | Admitting: Vascular Surgery

## 2020-06-25 ENCOUNTER — Ambulatory Visit (HOSPITAL_COMMUNITY): Payer: Medicare Other | Admitting: Certified Registered Nurse Anesthetist

## 2020-06-25 DIAGNOSIS — Z419 Encounter for procedure for purposes other than remedying health state, unspecified: Secondary | ICD-10-CM

## 2020-06-25 DIAGNOSIS — I10 Essential (primary) hypertension: Secondary | ICD-10-CM | POA: Insufficient documentation

## 2020-06-25 DIAGNOSIS — G8929 Other chronic pain: Secondary | ICD-10-CM | POA: Diagnosis present

## 2020-06-25 DIAGNOSIS — M48062 Spinal stenosis, lumbar region with neurogenic claudication: Secondary | ICD-10-CM | POA: Diagnosis not present

## 2020-06-25 DIAGNOSIS — E039 Hypothyroidism, unspecified: Secondary | ICD-10-CM | POA: Insufficient documentation

## 2020-06-25 DIAGNOSIS — G894 Chronic pain syndrome: Principal | ICD-10-CM | POA: Insufficient documentation

## 2020-06-25 DIAGNOSIS — M6281 Muscle weakness (generalized): Secondary | ICD-10-CM | POA: Diagnosis not present

## 2020-06-25 DIAGNOSIS — Z462 Encounter for fitting and adjustment of other devices related to nervous system and special senses: Secondary | ICD-10-CM | POA: Diagnosis not present

## 2020-06-25 DIAGNOSIS — R2689 Other abnormalities of gait and mobility: Secondary | ICD-10-CM | POA: Insufficient documentation

## 2020-06-25 DIAGNOSIS — M961 Postlaminectomy syndrome, not elsewhere classified: Secondary | ICD-10-CM | POA: Diagnosis not present

## 2020-06-25 HISTORY — PX: SPINAL CORD STIMULATOR INSERTION: SHX5378

## 2020-06-25 SURGERY — INSERTION, SPINAL CORD STIMULATOR, LUMBAR
Anesthesia: General | Site: Back

## 2020-06-25 MED ORDER — LACTATED RINGERS IV SOLN: INTRAVENOUS | Status: DC

## 2020-06-25 MED ORDER — HYDROCODONE-ACETAMINOPHEN 10-325 MG PO TABS
1.0000 | ORAL_TABLET | ORAL | Status: DC | PRN
Start: 2020-06-25 — End: 2020-06-26
  Administered 2020-06-25 – 2020-06-26 (×2): 2 via ORAL
  Administered 2020-06-26: 1 via ORAL
  Filled 2020-06-25: qty 1
  Filled 2020-06-25 (×2): qty 2

## 2020-06-25 MED ORDER — EPHEDRINE SULFATE 50 MG/ML IJ SOLN
INTRAMUSCULAR | Status: DC | PRN
Start: 2020-06-25 — End: 2020-06-25
  Administered 2020-06-25: 10 mg via INTRAVENOUS
  Administered 2020-06-25: 5 mg via INTRAVENOUS
  Administered 2020-06-25 (×2): 10 mg via INTRAVENOUS

## 2020-06-25 MED ORDER — DEXAMETHASONE SODIUM PHOSPHATE 10 MG/ML IJ SOLN
INTRAMUSCULAR | Status: DC | PRN
  Administered 2020-06-25: 4 mg via INTRAVENOUS

## 2020-06-25 MED ORDER — SUFENTANIL CITRATE 50 MCG/ML IV SOLN
INTRAVENOUS | Status: DC | PRN
  Administered 2020-06-25: 20 ug via INTRAVENOUS

## 2020-06-25 MED ORDER — BUPROPION HCL ER (XL) 300 MG PO TB24
300.0000 mg | ORAL_TABLET | Freq: Every day | ORAL | Status: DC
  Administered 2020-06-26: 10:00:00 300 mg via ORAL
  Filled 2020-06-25: qty 1

## 2020-06-25 MED ORDER — MAGNESIUM CITRATE PO SOLN: 1.0000 | Freq: Once | ORAL | Status: DC | PRN

## 2020-06-25 MED ORDER — ONDANSETRON HCL 4 MG PO TABS: 4.0000 mg | ORAL_TABLET | Freq: Three times a day (TID) | ORAL | 0 refills | Status: DC | PRN

## 2020-06-25 MED ORDER — BUPIVACAINE-EPINEPHRINE 0.25% -1:200000 IJ SOLN
INTRAMUSCULAR | Status: DC | PRN
  Administered 2020-06-25: 20 mL

## 2020-06-25 MED ORDER — ROCURONIUM 10MG/ML (10ML) SYRINGE FOR MEDFUSION PUMP - OPTIME
INTRAVENOUS | Status: DC | PRN
  Administered 2020-06-25: 40 mg via INTRAVENOUS

## 2020-06-25 MED ORDER — FENTANYL CITRATE (PF) 100 MCG/2ML IJ SOLN: 25.0000 ug | INTRAMUSCULAR | Status: DC | PRN

## 2020-06-25 MED ORDER — ACETAMINOPHEN 650 MG RE SUPP: 650.0000 mg | RECTAL | Status: DC | PRN

## 2020-06-25 MED ORDER — SODIUM CHLORIDE 0.9% FLUSH: 3.0000 mL | INTRAVENOUS | Status: DC | PRN

## 2020-06-25 MED ORDER — CEFAZOLIN SODIUM-DEXTROSE 2-4 GM/100ML-% IV SOLN
2.0000 g | INTRAVENOUS | Status: AC
  Administered 2020-06-25: 11:00:00 2 g via INTRAVENOUS
  Filled 2020-06-25: qty 100

## 2020-06-25 MED ORDER — ACETAMINOPHEN 325 MG PO TABS: 650.0000 mg | ORAL_TABLET | ORAL | Status: DC | PRN

## 2020-06-25 MED ORDER — CHLORHEXIDINE GLUCONATE 0.12 % MT SOLN
15.0000 mL | Freq: Once | OROMUCOSAL | Status: AC
  Administered 2020-06-25: 09:00:00 15 mL via OROMUCOSAL
  Filled 2020-06-25: qty 15

## 2020-06-25 MED ORDER — METHOCARBAMOL 500 MG PO TABS: 500.0000 mg | ORAL_TABLET | Freq: Three times a day (TID) | ORAL | 0 refills | Status: AC | PRN

## 2020-06-25 MED ORDER — ONDANSETRON HCL 4 MG PO TABS: 4.0000 mg | ORAL_TABLET | Freq: Four times a day (QID) | ORAL | Status: DC | PRN

## 2020-06-25 MED ORDER — LACTATED RINGERS IV SOLN
INTRAVENOUS | Status: DC | PRN
Start: 2020-06-25 — End: 2020-06-25

## 2020-06-25 MED ORDER — CEFAZOLIN SODIUM-DEXTROSE 1-4 GM/50ML-% IV SOLN
1.0000 g | Freq: Three times a day (TID) | INTRAVENOUS | Status: AC
  Administered 2020-06-25 – 2020-06-26 (×2): 1 g via INTRAVENOUS
  Filled 2020-06-25 (×2): qty 50

## 2020-06-25 MED ORDER — BUPIVACAINE LIPOSOME 1.3 % IJ SUSP
20.0000 mL | Freq: Once | INTRAMUSCULAR | Status: AC
  Administered 2020-06-25: 12:00:00 20 mL
  Filled 2020-06-25: qty 20

## 2020-06-25 MED ORDER — POLYETHYLENE GLYCOL 3350 17 G PO PACK: 17.0000 g | PACK | Freq: Every day | ORAL | Status: DC | PRN

## 2020-06-25 MED ORDER — ORAL CARE MOUTH RINSE: 15.0000 mL | Freq: Once | OROMUCOSAL | Status: AC

## 2020-06-25 MED ORDER — METHOCARBAMOL 1000 MG/10ML IJ SOLN
500.0000 mg | Freq: Four times a day (QID) | INTRAVENOUS | Status: DC | PRN
  Filled 2020-06-25: qty 5

## 2020-06-25 MED ORDER — SODIUM CHLORIDE 0.9% FLUSH
3.0000 mL | Freq: Two times a day (BID) | INTRAVENOUS | Status: DC
  Administered 2020-06-25: 21:00:00 3 mL via INTRAVENOUS

## 2020-06-25 MED ORDER — LIDOCAINE HCL (CARDIAC) PF 100 MG/5ML IV SOSY
PREFILLED_SYRINGE | INTRAVENOUS | Status: DC | PRN
  Administered 2020-06-25: 100 mg via INTRAVENOUS

## 2020-06-25 MED ORDER — 0.9 % SODIUM CHLORIDE (POUR BTL) OPTIME
TOPICAL | Status: DC | PRN
  Administered 2020-06-25: 12:00:00 1000 mL

## 2020-06-25 MED ORDER — PROPOFOL 10 MG/ML IV BOLUS
INTRAVENOUS | Status: DC | PRN
  Administered 2020-06-25: 30 mg via INTRAVENOUS
  Administered 2020-06-25: 120 mg via INTRAVENOUS

## 2020-06-25 MED ORDER — MENTHOL 3 MG MT LOZG: 1.0000 | LOZENGE | OROMUCOSAL | Status: DC | PRN

## 2020-06-25 MED ORDER — PROPOFOL 10 MG/ML IV BOLUS
INTRAVENOUS | Status: AC
  Filled 2020-06-25: qty 20

## 2020-06-25 MED ORDER — ONDANSETRON HCL 4 MG/2ML IJ SOLN
INTRAMUSCULAR | Status: DC | PRN
  Administered 2020-06-25: 4 mg via INTRAVENOUS

## 2020-06-25 MED ORDER — ACETAMINOPHEN 500 MG PO TABS: 1000.0000 mg | ORAL_TABLET | Freq: Once | ORAL | Status: DC

## 2020-06-25 MED ORDER — MORPHINE SULFATE (PF) 2 MG/ML IV SOLN: 1.0000 mg | INTRAVENOUS | Status: DC | PRN

## 2020-06-25 MED ORDER — DULOXETINE HCL 30 MG PO CPEP
30.0000 mg | ORAL_CAPSULE | Freq: Every day | ORAL | Status: DC
  Administered 2020-06-26: 10:00:00 30 mg via ORAL

## 2020-06-25 MED ORDER — HEMOSTATIC AGENTS (NO CHARGE) OPTIME
TOPICAL | Status: DC | PRN
  Administered 2020-06-25: 1

## 2020-06-25 MED ORDER — PHENOL 1.4 % MT LIQD: 1.0000 | OROMUCOSAL | Status: DC | PRN

## 2020-06-25 MED ORDER — BUPIVACAINE-EPINEPHRINE (PF) 0.25% -1:200000 IJ SOLN
INTRAMUSCULAR | Status: AC
  Filled 2020-06-25: qty 20

## 2020-06-25 MED ORDER — THROMBIN 20000 UNITS EX SOLR
CUTANEOUS | Status: AC
  Filled 2020-06-25: qty 20000

## 2020-06-25 MED ORDER — AMISULPRIDE (ANTIEMETIC) 5 MG/2ML IV SOLN: 10.0000 mg | Freq: Once | INTRAVENOUS | Status: DC | PRN

## 2020-06-25 MED ORDER — SUGAMMADEX SODIUM 200 MG/2ML IV SOLN
INTRAVENOUS | Status: DC | PRN
  Administered 2020-06-25: 200 mg via INTRAVENOUS

## 2020-06-25 MED ORDER — PHENYLEPHRINE HCL-NACL 10-0.9 MG/250ML-% IV SOLN
INTRAVENOUS | Status: DC | PRN
Start: 2020-06-25 — End: 2020-06-25
  Administered 2020-06-25: 20 ug/min via INTRAVENOUS

## 2020-06-25 MED ORDER — HYDROCODONE-ACETAMINOPHEN 5-325 MG PO TABS: 1.0000 | ORAL_TABLET | ORAL | Status: DC | PRN

## 2020-06-25 MED ORDER — SUFENTANIL CITRATE 50 MCG/ML IV SOLN
INTRAVENOUS | Status: AC
  Filled 2020-06-25: qty 1

## 2020-06-25 MED ORDER — METHOCARBAMOL 500 MG PO TABS
500.0000 mg | ORAL_TABLET | Freq: Four times a day (QID) | ORAL | Status: DC | PRN
  Administered 2020-06-25 (×2): 500 mg via ORAL
  Filled 2020-06-25 (×2): qty 1

## 2020-06-25 MED ORDER — MECLIZINE HCL 25 MG PO TABS
25.0000 mg | ORAL_TABLET | Freq: Three times a day (TID) | ORAL | Status: DC | PRN
  Filled 2020-06-25: qty 1

## 2020-06-25 MED ORDER — ONDANSETRON HCL 4 MG/2ML IJ SOLN: 4.0000 mg | Freq: Four times a day (QID) | INTRAMUSCULAR | Status: DC | PRN

## 2020-06-25 MED ORDER — HYDROCODONE-ACETAMINOPHEN 10-325 MG PO TABS
1.0000 | ORAL_TABLET | Freq: Four times a day (QID) | ORAL | 0 refills | Status: AC | PRN
Start: 2020-06-25 — End: 2020-06-30

## 2020-06-25 MED ORDER — LEVOTHYROXINE SODIUM 75 MCG PO TABS
75.0000 ug | ORAL_TABLET | Freq: Every day | ORAL | Status: DC
  Administered 2020-06-26: 06:00:00 75 ug via ORAL
  Filled 2020-06-25: qty 1

## 2020-06-25 SURGICAL SUPPLY — 57 items
AGENT HMST KT MTR STRL THRMB (HEMOSTASIS) ×1
CANISTER SUCT 3000ML PPV (MISCELLANEOUS) ×2 IMPLANT
CLSR STERI-STRIP ANTIMIC 1/2X4 (GAUZE/BANDAGES/DRESSINGS) ×2 IMPLANT
COVER MAYO STAND STRL (DRAPES) ×2 IMPLANT
COVER PROBE W GEL 5X96 (DRAPES) IMPLANT
COVER SURGICAL LIGHT HANDLE (MISCELLANEOUS) ×2 IMPLANT
COVER WAND RF STERILE (DRAPES) IMPLANT
DRAPE C-ARM 42X72 X-RAY (DRAPES) ×2 IMPLANT
DRAPE SURG 17X23 STRL (DRAPES) ×1 IMPLANT
DRAPE U-SHAPE 47X51 STRL (DRAPES) ×3 IMPLANT
DRSG OPSITE POSTOP 4X6 (GAUZE/BANDAGES/DRESSINGS) ×3 IMPLANT
DURAPREP 26ML APPLICATOR (WOUND CARE) ×2 IMPLANT
ELECT BLADE 4.0 EZ CLEAN MEGAD (MISCELLANEOUS)
ELECT CAUTERY BLADE 6.4 (BLADE) IMPLANT
ELECT PENCIL ROCKER SW 15FT (MISCELLANEOUS) ×2 IMPLANT
ELECT REM PT RETURN 9FT ADLT (ELECTROSURGICAL) ×2
ELECTRODE BLDE 4.0 EZ CLN MEGD (MISCELLANEOUS) IMPLANT
ELECTRODE REM PT RTRN 9FT ADLT (ELECTROSURGICAL) ×1 IMPLANT
GENERATOR PULSE PROCLAIM 5ELIT (Neuro Prosthesis/Implant) IMPLANT
GLOVE BIO SURGEON STRL SZ7 (GLOVE) ×2 IMPLANT
GLOVE BIOGEL PI IND STRL 7.0 (GLOVE) ×1 IMPLANT
GLOVE BIOGEL PI IND STRL 8.5 (GLOVE) ×1 IMPLANT
GLOVE BIOGEL PI INDICATOR 7.0 (GLOVE) ×1
GLOVE BIOGEL PI INDICATOR 8.5 (GLOVE) ×1
GLOVE SS N UNI LF 8.5 STRL (GLOVE) ×2 IMPLANT
GOWN STRL REUS W/ TWL LRG LVL3 (GOWN DISPOSABLE) ×2 IMPLANT
GOWN STRL REUS W/TWL 2XL LVL3 (GOWN DISPOSABLE) ×2 IMPLANT
GOWN STRL REUS W/TWL LRG LVL3 (GOWN DISPOSABLE) ×4
KIT BASIN OR (CUSTOM PROCEDURE TRAY) ×2 IMPLANT
KIT TURNOVER KIT B (KITS) ×2 IMPLANT
LEAD LAMITRODE 60CM 8CH (Lead) ×4 IMPLANT
NDL SPNL 18GX3.5 QUINCKE PK (NEEDLE) ×1 IMPLANT
NEEDLE 22X1 1/2 (OR ONLY) (NEEDLE) ×2 IMPLANT
NEEDLE SPNL 18GX3.5 QUINCKE PK (NEEDLE) ×2 IMPLANT
NS IRRIG 1000ML POUR BTL (IV SOLUTION) ×2 IMPLANT
PACK LAMINECTOMY ORTHO (CUSTOM PROCEDURE TRAY) ×2 IMPLANT
PACK UNIVERSAL I (CUSTOM PROCEDURE TRAY) ×2 IMPLANT
PAD ARMBOARD 7.5X6 YLW CONV (MISCELLANEOUS) ×6 IMPLANT
PROGRAMMER DBS W/MAGNET NMRI (MISCELLANEOUS) ×2 IMPLANT
PULSE GENERATOR PROCLAIM 5ELIT (Neuro Prosthesis/Implant) ×2 IMPLANT
SPATULA SILICONE BRAIN 10MM (MISCELLANEOUS) ×2 IMPLANT
SPONGE LAP 4X18 RFD (DISPOSABLE) IMPLANT
SPONGE SURGIFOAM ABS GEL 100 (HEMOSTASIS) ×2 IMPLANT
STAPLER VISISTAT 35W (STAPLE) IMPLANT
SURGIFLO W/THROMBIN 8M KIT (HEMOSTASIS) ×2 IMPLANT
SUT BONE WAX W31G (SUTURE) ×2 IMPLANT
SUT ETHIBOND 2 OS 4 DA (SUTURE) ×2 IMPLANT
SUT MNCRL AB 3-0 PS2 18 (SUTURE) ×4 IMPLANT
SUT VIC AB 1 CT1 18XCR BRD 8 (SUTURE) ×2 IMPLANT
SUT VIC AB 1 CT1 8-18 (SUTURE) ×4
SUT VIC AB 2-0 CT1 18 (SUTURE) ×3 IMPLANT
SYR BULB IRRIG 60ML STRL (SYRINGE) ×2 IMPLANT
SYR CONTROL 10ML LL (SYRINGE) ×2 IMPLANT
TOWEL GREEN STERILE (TOWEL DISPOSABLE) ×2 IMPLANT
TOWEL GREEN STERILE FF (TOWEL DISPOSABLE) ×2 IMPLANT
WATER STERILE IRR 1000ML POUR (IV SOLUTION) ×1 IMPLANT
YANKAUER SUCT BULB TIP NO VENT (SUCTIONS) ×2 IMPLANT

## 2020-06-25 NOTE — Anesthesia Procedure Notes (Signed)

## 2020-06-25 NOTE — Op Note (Signed)
Operative report  Preoperative diagnosis: Chronic pain syndrome.  Failed back syndrome.  Postoperative diagnosis: Same  Operative procedure: Implantation of spinal cord stimulator.  Complications: None  First Assistant: Cleta Alberts, PA  Implants: Abbott spine.  Proclaim V  Battery.  S8 lamitrode x 2.  EBL: 20 cc  Indications: Brenda Solis is a very pleasant 85 year old woman with significant back buttock and bilateral neuropathic leg pain.  She has failed prior surgical intervention and has ongoing loss in quality of life.  As a result she ultimately had a spinal cord stimulator trial and noted improvement in her pain level and quality of life.  As a result of the successful trial we elected to move forward with permanent implantation.  Operative report: Patient was brought the operating room placed supine the operating room table.  After successful induction of general anesthesia and endotracheal intubation, teds and SCDs were applied.  She was turned prone onto the Wilson frame and all bony prominences were well-padded.  The thoracolumbar spine was prepped and draped in a standard fashion.  Timeout was taken to confirm patient procedure and all other important data.  Using AP fluoroscopy identified the T11 T10 pedicles and marked out my incision site.  The incision started at the inferior aspect of T10 pedicle and spanned down to the T11-12 interspace spinous space.  I infiltrated the skin incision with quarter percent Marcaine with epinephrine and then made my midline incision sharp dissection was carried out down to the deep fascia.  The paraspinal muscles were then infiltrated with quarter percent Marcaine with epinephrine and Exparel for intraoperative hemostasis and postoperative pain control.  I stripped the paraspinal muscles to expose the T10, T11, and portion of the T12 spinous process.  The T10 and T11 lamina were fully exposed.  Self-retaining retractor was place and the intraoperative  fluoroscopy view was used to confirm that I was at the appropriate level.  Based on the preoperative trial the patient had maximum coverage over the T9 vertebral body.  As such we elected to do a T10 laminotomy.  The interspinous process ligament and the inferior portion of the T10 spinous process was removed and I used a 2 mm Kerrison rongeur to perform a laminotomy of T10.  I then gently dissected through the central raphae of the ligamentum flavum and used my 2 mm Kerrison rongeur to remove the ligamentum flavum and expose the dorsal epidural fat.  I then obtained the lead and gently passed it up to the T8-9 disc space.  A second lead was placed just to the left of this.  Both leads were easily passed and there was no resistance.  I confirmed satisfactory position of the implants in both the AP and lateral planes.  The leads were then directly sutured to the T11 spinous process with #1 Ethibond suture to prevent migration.  I then wrapped the leads around the T11 spinous process.  A second incision was made over the left gluteal region and the battery pocket was bluntly dissected and created.  Using the submuscular passer I passed the leads from the thoracic wound to the battery wound site.  I then secured it into the battery and tightened the leads according manufacture standards.  The battery was then tested and per the representative it was functioning appropriately.  The excess lead was wrapped underneath the battery and placed into the pocket.  The battery was secured to the deep fascia with two #1 Vicryl sutures.  At this point time final x-rays  were taken demonstrating satisfactory position of the leads with no migration.  Both wounds were then copiously irrigated with normal saline and we confirmed hemostasis using FloSeal.  The deep fascia of both wounds were closed with interrupted #1 Vicryl suture, then a layer of 2-0 Vicryl suture, and 3-0 Monocryl for the skin.  Steri-Strips and dry dressing were  applied and the patient was ultimately extubated transfer the PACU without incident.  The end of the case all needle sponge counts were correct.

## 2020-06-25 NOTE — Transfer of Care (Signed)
Immediate Anesthesia Transfer of Care Note  Patient: Brenda Solis  Procedure(s) Performed: SPINAL CORD STIMULATOR INSERTION (N/A Back)  Patient Location: PACU  Anesthesia Type:General  Level of Consciousness: awake and alert   Airway & Oxygen Therapy: Patient Spontanous Breathing and Patient connected to nasal cannula oxygen  Post-op Assessment: Report given to RN, Post -op Vital signs reviewed and stable and Patient moving all extremities X 4  Post vital signs: Reviewed and stable  Last Vitals:  Vitals Value Taken Time  BP 149/63 06/25/20 1336  Temp    Pulse 83 06/25/20 1342  Resp 17 06/25/20 1342  SpO2 98 % 06/25/20 1342  Vitals shown include unvalidated device data.  Last Pain:  Vitals:   06/25/20 0850  TempSrc:   PainSc: 3       Patients Stated Pain Goal: 2 (06/25/20 0850)  Complications: No complications documented.

## 2020-06-25 NOTE — Discharge Instructions (Signed)
Spinal Cord Stimulator \\Laminectomy , Care After This sheet gives you information about how to care for yourself after your procedure. Your health care provider may also give you more specific instructions. If you have problems or questions, contact your health care provider. What can I expect after the procedure? After the procedure, it is common to have:  Some pain around your incision area.  Muscle tightening (spasms) across the back.   Follow these instructions at home: Incision care  Follow instructions from your health care provider about how to take care of your incision area. Make sure you: ? Wash your hands with soap and water before and after you apply medicine to the area or change your bandage (dressing). If soap and water are not available, use hand sanitizer. ? Change your dressing as told by your health care provider. ? Leave stitches (sutures), skin glue, or adhesive strips in place. These skin closures may need to stay in place for 2 weeks or longer. If adhesive strip edges start to loosen and curl up, you may trim the loose edges. Do not remove adhesive strips completely unless your health care provider tells you to do that.    Check your incision area every day for signs of infection. Check for: ? More redness, swelling, or pain. ? More fluid or blood. ? Warmth. ? Pus or a bad smell. Medicines  Take over-the-counter and prescription medicines only as told by your health care provider.  If you were prescribed an antibiotic medicine, use it as told by your health care provider. Do not stop using the antibiotic even if you start to feel better.  If needed, call office in 3 days to request refill of pain medications. Bathing  Do not take baths, swim, or use a hot tub for 6 weeks, or until your incision has healed completely.  If your health care provider approves, you may take showers after your dressing has been removed.  Ok to shower in 5  days Activity  Return to your normal activities as told by your health care provider. Ask your health care provider what activities are safe for you.  Avoid bending or twisting at your waist. Always bend at your knees.  Do not sit for more than 20-30 minutes at a time. Lie down or walk between periods of sitting.  Do not lift anything that is heavier than 10 lb (4.5 kg) or the limit that your health care provider tells you, until he or she says that it is safe.  Do not drive for 2 weeks after your procedure or for as long as your health care provider tells you.    Do not drive or use heavy machinery while taking prescription pain medicine. General instructions  To prevent or treat constipation while you are taking prescription pain medicine, your health care provider may recommend that you: ? Drink enough fluid to keep your urine clear or pale yellow. ? Take over-the-counter or prescription medicines. ? Eat foods that are high in fiber, such as fresh fruits and vegetables, whole grains, and beans. ? Limit foods that are high in fat and processed sugars, such as fried and sweet foods.  Do breathing exercises as told.  Keep all follow-up visits as told by your health care provider. This is important. Contact a health care provider if:  You have more redness, swelling, or pain around your incision area.  Your incision feels warm to the touch.  You are not able to return to activities or  do exercises as told by your health care provider. Get help right away if:  You have: ? More fluid or blood coming from your incision area. ? Pus or a bad smell coming from your incision area. ? Chills or a fever. ? Episodes of dizziness or fainting while standing.  You develop a rash.  You develop shortness of breath or you have difficulty breathing.  You cannot control when you urinate or have a bowel movement.  You become weak.  You are not able to use your legs. Summary  After the  procedure, it is common to have some pain around your incision area. You may also have muscle tightening (spasms) across the back.  Follow instructions from your health care provider about how to care for your incision.  Do not lift anything that is heavier than 10 lb (4.5 kg) or the limit that your health care provider tells you, until he or she says that it is safe.  Contact your health care provider if you have more redness, swelling, or pain around your incision area or if your incision feels warm to the touch. These can be signs of infection. This information is not intended to replace advice given to you by your health care provider. Make sure you discuss any questions you have with your health care provider. Refer to this sheet in the next few weeks. These instructions provide you with information about caring for yourself after your procedure. Your health care provider may also give you more specific instructions. Your treatment has been planned according to current medical practices, but problems sometimes occur. Call your health care provider if you have any problems or questions after your procedure. What can I expect after the procedure? It is common to have pain for the first few days after the procedure. Some people continue to have mild pain even after making a full recovery. Follow these instructions at home: Medicine  Take medicines only as directed by your health care provider.  Avoid taking over-the-counter pain medicines unless your health care provider tells you otherwise. These medicines interfere with the development and growth of new bone cells.  If you were prescribed a narcotic pain medicine, take it exactly as told by your health care provider. ? Do not drink alcohol while on the medicine. ? Do not drive while on the medicine. Injury care  Care for your back brace as told by your health care provider.  If directed, apply ice to the injured area: ? Put ice in a  plastic bag. ? Place a towel between your skin and the bag. ? Leave the ice on for 20 minutes, 2-3 times a day. Activity  Perform physical therapy exercises as told by your health care provider.  Exercise regularly. Start by taking short walks. Slowly increase your activity level over time. Gentle exercise helps to ease pain.  Sit, stand, walk, turn in bed, and reposition yourself as told by your health care provider. This will help to keep your spine in proper alignment.  Avoid bending and twisting your body.  Avoid doing strenuous household chores, such as vacuuming.  Do not lift anything that is heavier than 10 lb (4.5 kg). Other Instructions  Keep all follow-up visits as directed by your health care provider. This is important.  Do not use any tobacco products, including cigarettes, chewing tobacco, or electronic cigarettes. If you need help quitting, ask your health care provider. Nicotine affects the way bones heal. Contact a health care provider if:  Your pain gets worse.  You have a fever.  You have redness, swelling, or pain at the site of your incision.  You have fluid, blood, or pus coming from your incision.  You have numbness, tingling, or weakness in any part of your body. Get help right away if:  Your incision feels swollen and tender, and the surrounding area looks like a lump. The lump may be red or bluish in color.  You cannot move any part of your body (paralysis). You cannot control your bladder or bowels.Care After This sheet gives you information about how to care for yourself after your procedure. Your health care provider may also give you more specific instructions. If you have problems or questions, contact your health care provider. What can I expect after the procedure? After the procedure, it is common to have:  Soreness or pain.  Some swelling in the area where the hardware was removed.  A small amount of blood or clear fluid coming from  your incision. Follow these instructions at home: If you have a cast:  Do not stick anything inside the cast to scratch your skin. Doing that increases your risk of infection.  Check the skin around the cast every day. Tell your health care provider about any concerns.  You may put lotion on dry skin around the edges of the cast. Do not put lotion on the skin underneath the cast.  Keep the cast clean and dry. Do not take baths, swim, or use a hot tub until your health care provider approves. Ask your health care provider if you may take showers. You may only be allowed to take sponge baths. 1. Keep the bandage (dressing) dry until your health care provider says it can be removed. 2. Ok to shower in 5 days.    Incision care  1. Follow instructions from your health care provider about how to take care of your incision. Make sure you: ? Wash your hands with soap and water before you change your dressing. If soap and water are not available, use hand sanitizer. ? Change your dressing as told by your health care provider. ? Leave stitches (sutures), skin glue, or adhesive strips in place. These skin closures may need to stay in place for 2 weeks or longer. If adhesive strip edges start to loosen and curl up, you may trim the loose edges. Do not remove adhesive strips completely unless your health care provider tells you to do that. 2. Check your incision area every day for signs of infection. Check for: ? Redness. ? More swelling or pain. ? More fluid or blood. ? Warmth. ? Pus or a bad smell. Managing pain, stiffness, and swelling  1. If directed, put ice on the affected area: ? Put ice in a plastic bag. ? Place a towel between your skin and the bag. ? Leave the ice on for 20 minutes, 2-3 times a day. 2. Move your fingers or toes often to avoid stiffness and to lessen swelling.  Driving  Do not drive or use heavy machinery while taking prescription pain medicine.  Do not drive for  24 hours if you were given a medicine to help you relax (sedative) during your procedure.  Ask your health care provider when it is safe to drive if you have a cast, splint, or boot on the affected limb. Activity  Ask your health care provider what activities are safe for you during recovery, and ask what activities you need to avoid.  Do not use the injured limb to support your body weight until your health care provider says that you can.  Do not play contact sports until your health care provider approves.  Do exercises as told by your health care provider.  Avoid sitting for a long time without moving. Get up and move around at least every few hours. This will help prevent blood clots. General instructions 1. Do not put pressure on any part of the cast or splint until it is fully hardened. This may take several hours. 2. If you are taking prescription pain medicine, take actions to prevent or treat constipation. Your health care provider may recommend that you: ? Drink enough fluid to keep your urine pale yellow. ? Eat foods that are high in fiber, such as fresh fruits and vegetables, whole grains, and beans. ? Limit foods that are high in fat and processed sugars, such as fried or sweet foods. ? Take an over-the-counter or prescription medicine for constipation. 3. Do not use any products that contain nicotine or tobacco, such as cigarettes and e-cigarettes. These can delay bone healing after surgery. If you need help quitting, ask your health care provider. 4. Take over-the-counter and prescription medicines only as told by your health care provider. 5. Keep all follow-up visits as told by your health care provider. This is important. Contact a health care provider if:  You have lasting pain.  You have redness around your incision.  You have more swelling or pain around your incision.  You have more fluid or blood coming from your incision.  Your incision feels warm to the  touch.  You have pus or a bad smell coming from your incision.  You are unable to do exercises or physical activity as told by your health care provider. Get help right away if:  You have difficulty breathing.  You have chest pain.  You have severe pain.  You have a fever or chills.  You have numbness for more than 24 hours in the area where the hardware was removed. Summary  After the procedure, it is common to have some pain and swelling in the area where the hardware was removed.  Follow instructions from your health care provider about how to take care of your incision.  Return to your normal activities as told by your health care provider. Ask your health care provider what activities are safe for you. This information is not intended to replace advice given to you by your health care provider. Make sure you discuss any questions you have with your health care provider.

## 2020-06-25 NOTE — H&P (Signed)
Addendum H&P  Brenda Solis presents today for implantation of a spinal cord stimulator for chronic pain syndrome.  Patient continues to have significant chronic back pain and neuropathic leg pain despite appropriate conservative management.  The patient had a successful spinal cord stimulator trial and presents now for definitive implantation.  I have gone over the surgical procedure as well as the risks benefits and alternatives and she is expressed a desire to move forward with the surgery as well as an understanding of those risks.  There has been no change in her clinical exam since her last office visit of 06/17/2020.

## 2020-06-26 ENCOUNTER — Encounter (HOSPITAL_COMMUNITY): Payer: Self-pay | Admitting: Orthopedic Surgery

## 2020-06-26 DIAGNOSIS — R2689 Other abnormalities of gait and mobility: Secondary | ICD-10-CM | POA: Diagnosis not present

## 2020-06-26 DIAGNOSIS — E039 Hypothyroidism, unspecified: Secondary | ICD-10-CM | POA: Diagnosis not present

## 2020-06-26 DIAGNOSIS — I10 Essential (primary) hypertension: Secondary | ICD-10-CM | POA: Diagnosis not present

## 2020-06-26 DIAGNOSIS — G894 Chronic pain syndrome: Secondary | ICD-10-CM | POA: Diagnosis not present

## 2020-06-26 DIAGNOSIS — M6281 Muscle weakness (generalized): Secondary | ICD-10-CM | POA: Diagnosis not present

## 2020-06-26 NOTE — Progress Notes (Signed)
Patient alert and oriented, mae's well, voiding adequate amount of urine, swallowing without difficulty, no c/o pain at time of discharge. Patient discharged home with family. Script and discharged instructions given to patient. Patient and family stated understanding of instructions given. Patient has an appointment with Dr. Brooks  

## 2020-06-26 NOTE — Progress Notes (Signed)
    Subjective: Procedure(s) (LRB): SPINAL CORD STIMULATOR INSERTION (N/A) 1 Day Post-Op  Patient reports pain as 1 on 0-10 scale.  Reports decreased leg pain reports incisional back pain   Positive void Negative bowel movement Positive flatus Negative chest pain or shortness of breath  Objective: Vital signs in last 24 hours: Temp:  [97.1 F (36.2 C)-99 F (37.2 C)] 98.6 F (37 C) (12/30 0449) Pulse Rate:  [77-96] 77 (12/30 0449) Resp:  [12-18] 18 (12/30 0449) BP: (122-169)/(46-72) 122/49 (12/30 0449) SpO2:  [93 %-100 %] 93 % (12/30 0449) Weight:  [93 kg] 93 kg (12/29 0840)  Intake/Output from previous day: 12/29 0701 - 12/30 0700 In: 1740 [P.O.:240; I.V.:1400; IV Piggyback:100] Out: 20 [Blood:20]  Labs: Recent Labs    06/23/20 1428  WBC 9.9  RBC 4.18  HCT 39.1  PLT 268   Recent Labs    06/23/20 1428  NA 138  K 4.6  CL 107  CO2 21*  BUN 17  CREATININE 1.02*  GLUCOSE 100*  CALCIUM 10.1   Recent Labs    06/23/20 1428  INR 1.0    Physical Exam: Neurologically intact ABD soft Intact pulses distally Incision: dressing C/D/I Compartment soft Body mass index is 31.63 kg/m.   Assessment/Plan: Patient stable  xrays n/a Continue mobilization with physical therapy Continue care  Advance diet Up with therapy  Patient is doing quite well status post implantation of spinal cord stimulator.  Plan on discharge to home today.  She will follow-up with me in 2 weeks.  Venita Lick, MD [facility] [phone]

## 2020-06-26 NOTE — Evaluation (Signed)
Physical Therapy Evaluation Patient Details Name: Brenda Solis MRN: [id number] DOB: July 28, 1934 Today's Date: 06/26/2020   History of Present Illness  85 y.o. female with significant back, buttock, and BLE neuropathic pain. Pt underwent implantation of spinal cord stimulator on 06/25/2020. PMH includes depression, HTN, insomnia, hypothyroidism.  Clinical Impression  Pt presents to PT with deficits in gait, balance, endurance, and functional mobility, but does not seem far from her baseline. The pt currently requires physical assistance to manage stairs, as well as BUE or assistance to ambulate on uneven surfaces. Pt mobilizing well otherwise and is able to verbalize back precautions. Pt will benefit from continued acute PT POC during admission. PT recommends outpatient PT at the time of discharge.    Follow Up Recommendations Outpatient PT;Supervision - Intermittent    Equipment Recommendations  None recommended by PT (PT encourages use of quad cane for all mobility)    Recommendations for Other Services       Precautions / Restrictions Precautions Precautions: Fall;Back Precaution Booklet Issued: Yes (comment) Precaution Comments: no brace per orders Restrictions Weight Bearing Restrictions: No      Mobility  Bed Mobility Overal bed mobility: Modified Independent             General bed mobility comments: PT provides cues for log roll technique    Transfers Overall transfer level: Modified independent Equipment used: Quad cane Transfers: Sit to/from Stand Sit to Stand: Modified independent (Device/Increase time)         General transfer comment: Supervision with sit to stand and mobility to/from bathroom in room with quad cane. Pt noted to be reaching out with other UE for additional support. Encouraged to use Rollator for short period at home if continues to notice furniture walking  Ambulation/Gait Ambulation/Gait assistance: Supervision Gait Distance (Feet):  150 Feet Assistive device: Quad cane Gait Pattern/deviations: Step-to pattern Gait velocity: functional Gait velocity interpretation: 1.31 - 2.62 ft/sec, indicative of limited community ambulator General Gait Details: pt with short step-to gait, requires BUE support when ambulating over uneven surfaces  Stairs Stairs: Yes Stairs assistance: Min assist Stair Management: One rail Right;Step to pattern Number of Stairs: 3    Wheelchair Mobility    Modified Rankin (Stroke Patients Only)       Balance Overall balance assessment: Needs assistance Sitting-balance support: No upper extremity supported;Feet supported Sitting balance-Leahy Scale: Good     Standing balance support: Single extremity supported Standing balance-Leahy Scale: Poor Standing balance comment: reliant on UE support of quad cane                             Pertinent Vitals/Pain Pain Assessment: Faces Faces Pain Scale: Hurts a little bit Pain Location: incision Pain Descriptors / Indicators: Grimacing Pain Intervention(s): Monitored during session    Home Living Family/patient expects to be discharged to:: Private residence Living Arrangements: Alone Available Help at Discharge: Family;Available 24 hours/day Type of Home: House Home Access: Stairs to enter Entrance Stairs-Rails: Right Entrance Stairs-Number of Steps: 3 Home Layout: One level Home Equipment: Walker - 4 wheels;Cane - quad;Bedside commode;Shower seat;Grab bars - toilet;Grab bars - tub/shower;Hand held shower head      Prior Function Level of Independence: Independent with assistive device(s)         Comments: pt reports intermittently utilizing quad cane at home, always utilizing quad cane for community mobility     Hand Dominance   Dominant Hand: Right    Extremity/Trunk Assessment  Upper Extremity Assessment Upper Extremity Assessment: Overall WFL for tasks assessed    Lower Extremity Assessment Lower  Extremity Assessment: Overall WFL for tasks assessed    Cervical / Trunk Assessment Cervical / Trunk Assessment: Kyphotic  Communication   Communication: HOH  Cognition Arousal/Alertness: Awake/alert Behavior During Therapy: WFL for tasks assessed/performed Overall Cognitive Status: Within Functional Limits for tasks assessed                                 General Comments: very pleasant and cooperative      General Comments General comments (skin integrity, edema, etc.): VSS on RA    Exercises     Assessment/Plan    PT Assessment Patient needs continued PT services  PT Problem List Decreased strength;Decreased activity tolerance;Decreased balance;Decreased mobility;Decreased knowledge of use of DME       PT Treatment Interventions DME instruction;Gait training;Stair training;Balance training;Therapeutic activities;Functional mobility training;Patient/family education    PT Goals (Current goals can be found in the Care Plan section)  Acute Rehab PT Goals Patient Stated Goal: to go home PT Goal Formulation: With patient Time For Goal Achievement: 07/10/20 Potential to Achieve Goals: Good Additional Goals Additional Goal #1: Pt will score >19/24 on DGI to indicate a reduced risk for falls    Frequency Min 5X/week   Barriers to discharge        Co-evaluation               AM-PAC PT "6 Clicks" Mobility  Outcome Measure Help needed turning from your back to your side while in a flat bed without using bedrails?: None Help needed moving from lying on your back to sitting on the side of a flat bed without using bedrails?: None Help needed moving to and from a bed to a chair (including a wheelchair)?: None Help needed standing up from a chair using your arms (e.g., wheelchair or bedside chair)?: None Help needed to walk in hospital room?: A Little Help needed climbing 3-5 steps with a railing? : A Little 6 Click Score: 22    End of Session    Activity Tolerance: Patient tolerated treatment well Patient left: in bed;with call bell/phone within reach Nurse Communication: Mobility status PT Visit Diagnosis: Unsteadiness on feet (R26.81);Other abnormalities of gait and mobility (R26.89)    Time: 2353-6144 PT Time Calculation (min) (ACUTE ONLY): 12 min   Charges:   PT Evaluation $PT Eval Low Complexity: 1 Low          Arlyss Gandy, PT, DPT Acute Rehabilitation Pager: [phone]   Arlyss Gandy 06/26/2020, 10:21 AM

## 2020-06-26 NOTE — Evaluation (Signed)
Occupational Therapy Evaluation/Discharge Patient Details Name: Brenda Solis MRN: [id number] DOB: 11/15/1934 Today's Date: 06/26/2020    History of Present Illness 85 y.o. female with significant back, buttock, and BLE neuropathic pain. Pt underwent implantation of spinal cord stimulator on 06/25/2020. PMH includes depression, HTN, insomnia, hypothyroidism.   Clinical Impression   PTA, pt lives alone and reports Modified Independence in all ADLs, IADLs and mobility using quad cane. Pt presents now with improved back pain s/p surgery. Educated pt on spinal precautions for ADLs/IADLs and safety in the home. Pt overall Supervision for mobility using quad cane in room though noted to be reaching out for additional support while using cane. Encouraged pt to use Rollator at home if she continues to demo furniture walking to decrease fall risk. Pt overall Modified Independent with ADL routine this AM including dressing, bathing, and toileting. No further skilled OT services indicated at this time. OT to sign off.     Follow Up Recommendations  No OT follow up;Other (comment) (supervision for initial tub transfer attempts to ensure safety)    Equipment Recommendations  None recommended by OT (has all needed DME)    Recommendations for Other Services       Precautions / Restrictions Precautions Precautions: Fall;Back Precaution Booklet Issued: Yes (comment) Precaution Comments: no brace per orders Restrictions Weight Bearing Restrictions: No      Mobility Bed Mobility Overal bed mobility: Modified Independent             General bed mobility comments: use of bed rails    Transfers Overall transfer level: Needs assistance Equipment used: Quad cane Transfers: Sit to/from Stand Sit to Stand: Supervision         General transfer comment: Supervision with sit to stand and mobility to/from bathroom in room with quad cane. Pt noted to be reaching out with other UE for additional  support. Encouraged to use Rollator for short period at home if continues to notice furniture walking    Balance Overall balance assessment: Needs assistance Sitting-balance support: No upper extremity supported;Feet supported Sitting balance-Leahy Scale: Good     Standing balance support: Single extremity supported;No upper extremity supported;During functional activity Standing balance-Leahy Scale: Fair Standing balance comment: fair static standing at sink with ADLs, noted to reach out for additional support with mobility using cane                           ADL either performed or assessed with clinical judgement   ADL Overall ADL's : Modified independent;Independent                                       General ADL Comments: Pt able to demo toileting task, grooming and washing up at sink, as well as dressing seated on toilet without assistance.     Vision Baseline Vision/History: Wears glasses Wears Glasses: Reading only Patient Visual Report: No change from baseline Vision Assessment?: No apparent visual deficits     Perception     Praxis      Pertinent Vitals/Pain Pain Assessment: No/denies pain     Hand Dominance Right   Extremity/Trunk Assessment Upper Extremity Assessment Upper Extremity Assessment: Overall WFL for tasks assessed   Lower Extremity Assessment Lower Extremity Assessment: Defer to PT evaluation   Cervical / Trunk Assessment Cervical / Trunk Assessment: Kyphotic   Communication Communication Communication: No  difficulties   Cognition Arousal/Alertness: Awake/alert Behavior During Therapy: WFL for tasks assessed/performed Overall Cognitive Status: Within Functional Limits for tasks assessed                                 General Comments: very pleasant and cooperative   General Comments  Educated pt on spinal precautions to minimize pain and promote optimal healing. Encouraged pt to have family  provide supervision to ensure safety with initial tub transfer attempts    Exercises     Shoulder Instructions      Home Living Family/patient expects to be discharged to:: Private residence Living Arrangements: Alone Available Help at Discharge: Family;Available 24 hours/day (has multiple family members that live nearby or on same property) Type of Home: House Home Access: Stairs to enter [employer] of Steps: 3 Entrance Stairs-Rails: Right Home Layout: One level     Bathroom Shower/Tub: Chief Strategy Officer: Handicapped height (has donut on it)     Home Equipment: Walker - 4 wheels;Cane - quad;Bedside commode;Shower seat;Grab bars - toilet;Grab bars - tub/shower;Hand held shower head          Prior Functioning/Environment Level of Independence: Independent with assistive device(s)        Comments: Pt typically uses quad cane for mobility, Modified Independent with ADLs, IADLs, drives and grocery shops        OT Problem List:        OT Treatment/Interventions:      OT Goals(Current goals can be found in the care plan section) Acute Rehab OT Goals Patient Stated Goal: go home today, remain independent and with minimized pain OT Goal Formulation: All assessment and education complete, DC therapy  OT Frequency:     Barriers to D/C:            Co-evaluation              AM-PAC OT "6 Clicks" Daily Activity     Outcome Measure Help from another person eating meals?: None Help from another person taking care of personal grooming?: None Help from another person toileting, which includes using toliet, bedpan, or urinal?: None Help from another person bathing (including washing, rinsing, drying)?: None Help from another person to put on and taking off regular upper body clothing?: None Help from another person to put on and taking off regular lower body clothing?: None 6 Click Score: 24   End of Session Equipment Utilized During  Treatment: Other (comment) (quad cane) Nurse Communication: Mobility status  Activity Tolerance: Patient tolerated treatment well Patient left: in bed;with call bell/phone within reach  OT Visit Diagnosis: Muscle weakness (generalized) (M62.81);Other abnormalities of gait and mobility (R26.89)                Time: 4259-5638 OT Time Calculation (min): 27 min Charges:  OT General Charges $OT Visit: 1 Visit OT Evaluation $OT Eval Low Complexity: 1 Low OT Treatments $Self Care/Home Management : 8-22 mins  Lorre Munroe, OTR/L  Lorre Munroe 06/26/2020, 9:19 AM

## 2020-06-27 NOTE — Anesthesia Postprocedure Evaluation (Signed)
Anesthesia Post Note  Patient: Brenda Solis  Procedure(s) Performed: SPINAL CORD STIMULATOR INSERTION (N/A Back)     Patient location during evaluation: PACU Anesthesia Type: General Level of consciousness: awake and alert Pain management: pain level controlled Vital Signs Assessment: post-procedure vital signs reviewed and stable Respiratory status: spontaneous breathing, nonlabored ventilation, respiratory function stable and patient connected to nasal cannula oxygen Cardiovascular status: blood pressure returned to baseline and stable Postop Assessment: no apparent nausea or vomiting Anesthetic complications: no   No complications documented.  Last Vitals:  Vitals:   06/26/20 0449 06/26/20 0809  BP: (!) 122/49 (!) 114/54  Pulse: 77 68  Resp: 18 17  Temp: 37 C 36.7 C  SpO2: 93% 100%    Last Pain:  Vitals:   06/26/20 0809  TempSrc: Oral  PainSc:    Pain Goal: Patients Stated Pain Goal: 3 (06/26/20 0310)                 Kennieth Rad

## 2020-06-29 ENCOUNTER — Emergency Department (HOSPITAL_COMMUNITY)
Admission: EM | Admit: 2020-06-29 | Discharge: 2020-06-29 | Disposition: A | Discharge status: Home / Self Care | Payer: Medicare Other | Attending: Emergency Medicine | Admitting: Emergency Medicine

## 2020-06-29 ENCOUNTER — Other Ambulatory Visit: Payer: Self-pay

## 2020-06-29 ENCOUNTER — Emergency Department (HOSPITAL_COMMUNITY): Payer: Medicare Other

## 2020-06-29 ENCOUNTER — Encounter (HOSPITAL_COMMUNITY): Payer: Self-pay | Admitting: Emergency Medicine

## 2020-06-29 DIAGNOSIS — M25511 Pain in right shoulder: Secondary | ICD-10-CM | POA: Diagnosis not present

## 2020-06-29 DIAGNOSIS — T8484XA Pain due to internal orthopedic prosthetic devices, implants and grafts, initial encounter: Secondary | ICD-10-CM | POA: Diagnosis not present

## 2020-06-29 DIAGNOSIS — Z5321 Procedure and treatment not carried out due to patient leaving prior to being seen by health care provider: Secondary | ICD-10-CM | POA: Diagnosis not present

## 2020-06-29 NOTE — ED Triage Notes (Signed)
Pt c/o rt shoulder pain after having a spinal stimulator placement surgery last Wednesday.  Pt states she has not called the surgeon.

## 2020-06-30 ENCOUNTER — Ambulatory Visit: Payer: Medicare Other | Admitting: Family

## 2020-07-01 NOTE — Discharge Summary (Signed)
Patient ID: Brenda Solis MRN: [id number] DOB/AGE: 1935/03/01 86 y.o.  Admit date: 06/25/2020 Discharge date: 07/01/2020  Admission Diagnoses:  Active Problems:   Chronic pain   Discharge Diagnoses:  Active Problems:   Chronic pain  status post Procedure(s): SPINAL CORD STIMULATOR INSERTION  Past Medical History:  Diagnosis Date  . Depression   . Heart murmur   . Hyperlipidemia   . Hypertension   . Hypothyroidism   . Insomnia, unspecified   . Spondylosis of unspecified site without mention of myelopathy   . Thyroid disease    hypothyroidism    Surgeries: Procedure(s): SPINAL CORD STIMULATOR INSERTION on 06/25/2020   Consultants:   Discharged Condition: Improved  Hospital Course: Brenda Solis is an 86 y.o. female who was admitted 06/25/2020 for operative treatment of Chronic pain syndrome.  Failed back syndrome Chronic pain syndrome.  Failed back syndrome. Patient failed conservative treatments (please see the history and physical for the specifics) and had severe unremitting pain that affects sleep, daily activities and work/hobbies. After pre-op clearance, the patient was taken to the operating room on 06/25/2020 and underwent  Procedure(s): SPINAL CORD STIMULATOR INSERTION.    Patient was given perioperative antibiotics:  Anti-infectives (From admission, onward)   Start     Dose/Rate Route Frequency Ordered Stop   06/25/20 1930  ceFAZolin (ANCEF) IVPB 1 g/50 mL premix        1 g 100 mL/hr over 30 Minutes Intravenous Every 8 hours 06/25/20 1512 06/26/20 0339   06/25/20 0838  ceFAZolin (ANCEF) IVPB 2g/100 mL premix        2 g 200 mL/hr over 30 Minutes Intravenous 30 min pre-op 06/25/20 7564 06/25/20 1139       Patient was given sequential compression devices and early ambulation to prevent DVT.   Patient benefited maximally from hospital stay and there were no complications. At the time of discharge, the patient was urinating/moving their bowels without  difficulty, tolerating a regular diet, pain is controlled with oral pain medications and they have been cleared by PT/OT.   Recent vital signs: No data found.   Recent laboratory studies: No results for input(s): WBC, HGB, HCT, PLT, NA, K, CL, CO2, BUN, CREATININE, GLUCOSE, INR, CALCIUM in the last 72 hours.  Invalid input(s): PT, 2   Discharge Medications:   Allergies as of 06/26/2020      Reactions   Gabapentin Other (See Comments)   Dizzy   Oxycodone Itching      Medication List    STOP taking these medications   ascorbic acid 500 MG tablet Commonly known as: VITAMIN C   cholecalciferol 1000 units tablet Commonly known as: VITAMIN D   diclofenac Sodium 1 % Gel Commonly known as: Voltaren   Magnesium 200 MG Tabs   traMADol 50 MG tablet Commonly known as: ULTRAM   vitamin B-12 1000 MCG tablet Commonly known as: CYANOCOBALAMIN   zinc gluconate 50 MG tablet     TAKE these medications   buPROPion 300 MG 24 hr tablet Commonly known as: WELLBUTRIN XL Take 1 tablet (300 mg total) by mouth daily.   DULoxetine 30 MG capsule Commonly known as: CYMBALTA Take 1 capsule (30 mg total) by mouth daily. CANCEL 20mg .   levothyroxine 75 MCG tablet Commonly known as: SYNTHROID TAKE 1 TABLET BY MOUTH EVERY DAY BEFORE BREAKFAST What changed: See the new instructions.   meclizine 25 MG tablet Commonly known as: ANTIVERT TAKE ONE TABLET BY MOUTH THREE TIMES DAILY AS NEEDED FOR  DIZZINESS What changed:   how much to take  how to take this  when to take this  reasons to take this   ondansetron 4 MG tablet Commonly known as: Zofran Take 1 tablet (4 mg total) by mouth every 8 (eight) hours as needed for nausea or vomiting.   traZODone 50 MG tablet Commonly known as: DESYREL TAKE 1-2 TABLETS (50-100 MG TOTAL) BY MOUTH AT BEDTIME AS NEEDED FOR SLEEP. What changed:   how much to take  when to take this     ASK your doctor about these medications    HYDROcodone-acetaminophen 10-325 MG tablet Commonly known as: Norco Take 1 tablet by mouth every 6 (six) hours as needed for up to 5 days. Ask about: Should I take this medication?   methocarbamol 500 MG tablet Commonly known as: Robaxin Take 1 tablet (500 mg total) by mouth every 8 (eight) hours as needed for up to 5 days for muscle spasms. Ask about: Should I take this medication?       Diagnostic Studies: DG Chest 2 View  Result Date: 06/23/2020 CLINICAL DATA:  Preop evaluation. EXAM: CHEST - 2 VIEW COMPARISON:  01/23/2013 and prior. FINDINGS: No focal consolidation. No pneumothorax or pleural effusion. Cardiomediastinal silhouette is within normal limits. No acute osseous abnormality. IMPRESSION: No focal airspace disease. Electronically Signed   By: Primitivo Gauze M.D.   On: 06/23/2020 15:07   DG Thoracic Spine 2 View  Result Date: 06/25/2020 CLINICAL DATA:  Spinal cord stimulator placement. EXAM: THORACIC SPINE 2 VIEWS; DG C-ARM 1-60 MIN COMPARISON:  None. FINDINGS: Fluoro time: 30 seconds. Radiation: 22.77 mGy. Two C-arm fluoroscopic images were obtained intraoperatively and submitted for post operative interpretation. These images demonstrate spinal cord stimulator with leads projecting in the post thoracic canal. Please see the performing provider's procedural report for further detail. IMPRESSION: Intraoperative fluoroscopic imaging, as detailed above. Electronically Signed   By: Margaretha Sheffield MD   On: 06/25/2020 12:50   DG C-Arm 1-60 Min  Result Date: 06/25/2020 CLINICAL DATA:  Spinal cord stimulator placement. EXAM: THORACIC SPINE 2 VIEWS; DG C-ARM 1-60 MIN COMPARISON:  None. FINDINGS: Fluoro time: 30 seconds. Radiation: 22.77 mGy. Two C-arm fluoroscopic images were obtained intraoperatively and submitted for post operative interpretation. These images demonstrate spinal cord stimulator with leads projecting in the post thoracic canal. Please see the performing  provider's procedural report for further detail. IMPRESSION: Intraoperative fluoroscopic imaging, as detailed above. Electronically Signed   By: Margaretha Sheffield MD   On: 06/25/2020 12:50    Discharge Instructions    Incentive spirometry RT   Complete by: As directed        Follow-up Information    Melina Schools, MD. Schedule an appointment as soon as possible for a visit in 2 weeks.   Specialty: Orthopedic Surgery Why: If symptoms worsen, For suture removal, For wound re-check Contact information: [address] [place] [address] [id number]               Discharge Plan:  discharge to home  Disposition: stable    Signed: Yvonne Kendall Xuan Mateus for [facility] PA-C [facility] [phone] 07/01/2020, 11:15 AM

## 2020-07-02 ENCOUNTER — Ambulatory Visit: Payer: Medicare Other | Admitting: Family Medicine

## 2020-07-04 DIAGNOSIS — M25511 Pain in right shoulder: Secondary | ICD-10-CM | POA: Diagnosis not present

## 2020-07-09 DIAGNOSIS — Z23 Encounter for immunization: Secondary | ICD-10-CM | POA: Diagnosis not present

## 2020-08-13 ENCOUNTER — Other Ambulatory Visit: Payer: Self-pay | Admitting: Family Medicine

## 2020-08-13 DIAGNOSIS — F32A Depression, unspecified: Secondary | ICD-10-CM

## 2020-08-18 ENCOUNTER — Other Ambulatory Visit: Payer: Self-pay | Admitting: Family Medicine

## 2020-08-18 DIAGNOSIS — F32A Depression, unspecified: Secondary | ICD-10-CM

## 2020-08-26 ENCOUNTER — Ambulatory Visit (INDEPENDENT_AMBULATORY_CARE_PROVIDER_SITE_OTHER): Payer: Medicare Other | Admitting: Family Medicine

## 2020-08-26 ENCOUNTER — Encounter: Payer: Self-pay | Admitting: Family Medicine

## 2020-08-26 ENCOUNTER — Other Ambulatory Visit: Payer: Self-pay

## 2020-08-26 VITALS — BP 127/56 | HR 71 | Temp 97.4°F | Ht 68.0 in | Wt 205.0 lb

## 2020-08-26 DIAGNOSIS — F32A Depression, unspecified: Secondary | ICD-10-CM | POA: Diagnosis not present

## 2020-08-26 DIAGNOSIS — N1831 Chronic kidney disease, stage 3a: Secondary | ICD-10-CM | POA: Diagnosis not present

## 2020-08-26 DIAGNOSIS — I10 Essential (primary) hypertension: Secondary | ICD-10-CM

## 2020-08-26 DIAGNOSIS — E039 Hypothyroidism, unspecified: Secondary | ICD-10-CM

## 2020-08-26 DIAGNOSIS — M5136 Other intervertebral disc degeneration, lumbar region: Secondary | ICD-10-CM | POA: Diagnosis not present

## 2020-08-26 MED ORDER — BUPROPION HCL ER (XL) 300 MG PO TB24: 300.0000 mg | ORAL_TABLET | Freq: Every day | ORAL | 3 refills | Status: DC

## 2020-08-26 MED ORDER — LIDOCAINE 5 % EX PTCH: 1.0000 | MEDICATED_PATCH | CUTANEOUS | 99 refills | Status: DC

## 2020-08-26 MED ORDER — IBUPROFEN 400 MG PO TABS: 400.0000 mg | ORAL_TABLET | Freq: Every day | ORAL | 1 refills | Status: DC | PRN

## 2020-08-26 NOTE — Patient Instructions (Signed)
Use Ibuprofen SPARINGLY.  This can worsen kidney function.  Drink plenty of water the days you take it.  Use tylenol primarily.  Lidocaine patches sent.

## 2020-08-26 NOTE — Progress Notes (Signed)
Subjective: CC: Follow-up depression, hypothyroidism PCP: [name], DO YQM:VHQI Brenda Solis is a 86 y.o. female presenting to clinic today for:  1.  Hypothyroidism Patient is compliant with Synthroid 75 mcg daily.  No reports of tremor, heart palpitation, change in bowel habit  2.  Depression Patient reports depression is stable.  She continues to take Wellbutrin, Cymbalta.  She uses trazodone as needed sleep.  She needs a refill on her Wellbutrin  3.  Chronic low back pain Patient reports ongoing chronic low back pain.  She has history of spinal stimulator and there continue to gradually increase this.  She continues to have quite a bit of back pain.  She wants to know if there is anything else besides Tylenol she might be able to use.  She has history of intolerance to gabapentin.  Medical history significant for CKD 3   ROS: Per HPI  Allergies  Allergen Reactions  . Gabapentin Other (See Comments)    Dizzy  . Oxycodone Itching   Past Medical History:  Diagnosis Date  . Depression   . Heart murmur   . Hyperlipidemia   . Hypertension   . Hypothyroidism   . Insomnia, unspecified   . Spondylosis of unspecified site without mention of myelopathy   . Thyroid disease    hypothyroidism    Current Outpatient Medications:  .  buPROPion (WELLBUTRIN XL) 300 MG 24 hr tablet, Take 1 tablet (300 mg total) by mouth daily., Disp: 30 tablet, Rfl: 0 .  DULoxetine (CYMBALTA) 30 MG capsule, Take 1 capsule (30 mg total) by mouth daily. CANCEL 61m., Disp: 90 capsule, Rfl: 3 .  levothyroxine (SYNTHROID) 75 MCG tablet, TAKE 1 TABLET BY MOUTH EVERY DAY BEFORE BREAKFAST (Patient taking differently: Take 75 mcg by mouth daily before breakfast. TAKE 1 TABLET BY MOUTH EVERY DAY BEFORE BREAKFAST), Disp: 90 tablet, Rfl: 3 .  meclizine (ANTIVERT) 25 MG tablet, TAKE ONE TABLET BY MOUTH THREE TIMES DAILY AS NEEDED FOR DIZZINESS (Patient taking differently: Take 25 mg by mouth 3 (three) times  daily as needed for dizziness. TAKE ONE TABLET BY MOUTH THREE TIMES DAILY AS NEEDED FOR DIZZINESS), Disp: 30 tablet, Rfl: 2 .  ondansetron (ZOFRAN) 4 MG tablet, Take 1 tablet (4 mg total) by mouth every 8 (eight) hours as needed for nausea or vomiting., Disp: 20 tablet, Rfl: 0 .  traZODone (DESYREL) 50 MG tablet, TAKE 1-2 TABLETS (50-100 MG TOTAL) BY MOUTH AT BEDTIME AS NEEDED FOR SLEEP. (Patient taking differently: Take 100 mg by mouth at bedtime.), Disp: 180 tablet, Rfl: 1 Social History   Socioeconomic History  . Marital status: Widowed    Spouse name: Not on file  . Number of children: 3  . Years of education: Not on file  . 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 Smoking status: Never Smoker  . Smokeless tobacco: Never Used  Vaping Use  . Vaping Use: Never used  Substance and Sexual Activity  . Alcohol use: No    Alcohol/week: 0.0 standard drinks  . Drug use: No  . Sexual activity: Not Currently  Other Topics Concern  . Not on file  Social History Narrative  . Not on file   Social Determinants of Health   Financial Resource Strain: Not on file  Food Insecurity: Not on file  Transportation Needs: Not on file  Physical Activity: Not on file  Stress: Not on file  Social Connections: Not on file  Intimate Partner Violence: Not on file   Family History  Problem Relation Age of Onset  . Coronary artery disease Brother        82s  . Heart disease Brother   . Cancer Mother        breast  . Osteoporosis Mother   . Heart disease Mother   . Osteoporosis Maternal Aunt   . Heart disease Father   . Stroke Father   . Esophageal varices Brother   . Stroke Other        FAMILY  . Esophageal varices Other        FAMILY    Objective: Office vital signs reviewed. BP (!) 127/56   Pulse 71   Temp (!) 97.4 F (36.3 C) (Temporal)   Ht 5' 8"  (1.727 m)   Wt 205 lb (93 kg)   SpO2 97%    BMI 31.17 kg/m   Physical Examination:  General: Awake, alert, well nourished, No acute distress HEENT: Normal; no goiter.  No exophthalmos Cardio: regular rate and rhythm, S1S2 heard, no murmurs appreciated Pulm: clear to auscultation bilaterally, no wheezes, rhonchi or rales; normal work of breathing on room air MSK: Ambulating independently.  No midline tenderness palpation to the lumbar spine Neuro: No tremor  Assessment/ Plan: 86 y.o. female   Primary hypertension - Plan: Lipid Panel, CMP14+EGFR, CANCELED: CMP14+EGFR, CANCELED: Lipid Panel  Stage 3a chronic kidney disease ([place]) - Plan: VITAMIN D 25 Hydroxy (Vit-D Deficiency, Fractures), CMP14+EGFR, CBC, CANCELED: CMP14+EGFR, CANCELED: VITAMIN D 25 Hydroxy (Vit-D Deficiency, Fractures), CANCELED: CBC  Hypothyroidism, unspecified type - Plan: Thyroid Panel With TSH, Lipid Panel, CANCELED: Thyroid Panel With TSH, CANCELED: Lipid Panel  Degeneration, intervertebral disc, lumbar - Plan: lidocaine (LIDODERM) 5 %, ibuprofen (ADVIL) 400 MG tablet  Depression, unspecified depression type - Plan: buPROPion (WELLBUTRIN XL) 300 MG 24 hr tablet  Blood pressures well controlled.  She is diet controlled hypertension plan for CMP, lipid panel  Check renal function, vitamin D and CBC  Asymptomatic from a thyroid standpoint.  Check thyroid panel  She has known degenerative changes within the lumbar spine.  Lidocaine patches provided.  Given CKD 3 a, I would highly recommend using NSAIDs extremely sparingly if not at all.  We discussed depending on Tylenol and lidocaine patch.  However, I have given her low-dose Advil to have on hand for once daily as needed use.  Discussed adequate hydration.  If using frequently, need to monitor renal function more closely.  Amongst all of the NSAIDs, the Advil is the least offensive to the kidneys and therefore this is what was selected  With regards to her depression, continue Wellbutrin.  I have renewed  this.   No orders of the defined types were placed in this encounter.  No orders of the defined types were placed in this encounter.    [name], DO [place] [phone]

## 2020-08-27 ENCOUNTER — Telehealth: Payer: Self-pay | Admitting: *Deleted

## 2020-08-27 NOTE — Telephone Encounter (Signed)
LIDOCAINE PAD 5%, use as directed, is approved through 06/27/2021 under your Medicare Part D benefit. Reviewed by: System If the treating physician would like to discuss this coverage decision with the physician or health care professional reviewer, please call [employer] Prior Authorization department at [place] aware.

## 2020-08-27 NOTE — Telephone Encounter (Signed)
PA in process   Key: [id number] - PA Case ID: [id number] - Rx #: [id number] Need help? Call us at [phone] Status Sent to Plan today Drug Lidocaine 5% patches

## 2020-08-29 ENCOUNTER — Ambulatory Visit (INDEPENDENT_AMBULATORY_CARE_PROVIDER_SITE_OTHER): Payer: Medicare Other | Admitting: *Deleted

## 2020-08-29 DIAGNOSIS — Z0000 Encounter for general adult medical examination without abnormal findings: Secondary | ICD-10-CM

## 2020-08-29 NOTE — Progress Notes (Signed)
MEDICARE ANNUAL WELLNESS VISIT  08/29/2020  Telephone Visit Disclaimer This Medicare AWV was conducted by telephone due to national recommendations for restrictions regarding the COVID-19 Pandemic (e.g. social distancing).  I verified, using two identifiers, that I am speaking with Brenda Solis or their authorized healthcare agent. I discussed the limitations, risks, security, and privacy concerns of performing an evaluation and management service by telephone and the potential availability of an in-person appointment in the future. The patient expressed understanding and agreed to proceed.  Location of Patient: Home Location of Provider (nurse):  [facility]  Subjective:    Brenda Solis is a 86 y.o. female patient of [name], DO who had a Medicare Annual Wellness Visit today via telephone. Brenda Solis is Retired and lives alone. she has 3 children. she reports that she is socially active and does interact with friends/family regularly. she is minimally physically active and enjoys spending time with her family and going to church.  Patient Care Team: [name], DO as PCP - General (Family Medicine)  Advanced Directives 08/29/2020 06/29/2020 06/25/2020 08/21/2019 03/03/2018 07/28/2015 11/15/2014  Does Patient Have a Medical Advance Directive? No No No No No No No  Would patient like information on creating a medical advance directive? No - Patient declined No - Patient declined No - Patient declined No - Patient declined No - Patient declined - Yes - Educational materials given  Pre-existing out of facility DNR order (yellow form or pink MOST form) - - - - - - -    Hospital Utilization Over the Past 12 Months: # of hospitalizations or ER visits: 0 # of surgeries: 1  Review of Systems    Patient reports that her overall health is worse compared to last year. Due to back pain.   History obtained from chart review and the patient Musculoskeletal ROS:  positive for - pain in back - lower  Patient Reported Readings (BP, Pulse, CBG, Weight, etc) none  Pain Assessment Pain : 0-10 Pain Score: 2  Pain Type: Chronic pain Pain Location: Back Pain Orientation: Lower Pain Descriptors / Indicators: Constant Pain Onset: More than a month ago Pain Frequency: Constant     Current Medications & Allergies (verified) Allergies as of 08/29/2020      Reactions   Gabapentin Other (See Comments)   Dizzy   Oxycodone Itching      Medication List       Accurate as of August 29, 2020  8:27 AM. If you have any questions, ask your nurse or doctor.        buPROPion 300 MG 24 hr tablet Commonly known as: WELLBUTRIN XL Take 1 tablet (300 mg total) by mouth daily.   DULoxetine 30 MG capsule Commonly known as: CYMBALTA Take 1 capsule (30 mg total) by mouth daily. CANCEL 20mg .   ibuprofen 400 MG tablet Commonly known as: ADVIL Take 1 tablet (400 mg total) by mouth daily as needed for moderate pain. Take with food and only IF NEEDED.   levothyroxine 75 MCG tablet Commonly known as: SYNTHROID TAKE 1 TABLET BY MOUTH EVERY DAY BEFORE BREAKFAST What changed: See the new instructions.   lidocaine 5 % Commonly known as: Lidoderm Place 1 patch onto the skin daily. Remove & Discard patch within 12 hours or as directed by MD   Magnesium 250 MG Tabs Take by mouth. daily   meclizine 25 MG tablet Commonly known as: ANTIVERT TAKE ONE TABLET BY MOUTH THREE TIMES  DAILY AS NEEDED FOR DIZZINESS What changed:   how much to take  how to take this  when to take this  reasons to take this   methocarbamol 500 MG tablet Commonly known as: ROBAXIN Take 500 mg by mouth 3 (three) times daily as needed.   ondansetron 4 MG tablet Commonly known as: Zofran Take 1 tablet (4 mg total) by mouth every 8 (eight) hours as needed for nausea or vomiting.   traZODone 50 MG tablet Commonly known as: DESYREL TAKE 1-2 TABLETS (50-100 MG TOTAL) BY MOUTH AT BEDTIME  AS NEEDED FOR SLEEP. What changed:   how much to take  when to take this   Zinc 50 MG Tabs Take by mouth.       History (reviewed): Past Medical History:  Diagnosis Date  . Depression   . Heart murmur   . Hyperlipidemia   . Hypertension   . Hypothyroidism   . Insomnia, unspecified   . Spondylosis of unspecified site without mention of myelopathy   . Thyroid disease    hypothyroidism   Past Surgical History:  Procedure Laterality Date  . APPENDECTOMY    . CATARACT EXTRACTION, BILATERAL Bilateral 2006  . [place]  . [place]  . CHOLECYSTECTOMY  1990  . DECOMPRESSIVE LUMBAR LAMINECTOMY LEVEL 1 N/A 02/01/2013   Procedure: CENTRAL DECOMPRESSIVE LUMBAR LAMINECTOMY LEVEL 1 L4-L5;  Surgeon: Tobi Bastos, MD;  Location: [facility];  Service: Orthopedics;  Laterality: N/A;  . DECOMPRESSIVE LUMBAR LAMINECTOMY LEVEL 1 N/A 11/20/2014   Procedure: REVISION L4-S1 DECOMPRESSION, L4-L5 IN SITU FUSION (LEVEL 1);  Surgeon: Melina Schools, MD;  Location: [facility];  Service: Orthopedics;  Laterality: N/A;  . KNEE SURGERY Right 2009   meniscus repair  . SPINAL CORD STIMULATOR INSERTION N/A 06/25/2020   Procedure: SPINAL CORD STIMULATOR INSERTION;  Surgeon: Melina Schools, MD;  Location: [facility];  Service: Orthopedics;  Laterality: N/A;  3 hrs  . TOTAL HIP ARTHROPLASTY  2011   RIGHT   Family History  Problem Relation Age of Onset  . Coronary artery disease Brother        72s  . Heart disease Brother   . Cancer Mother        breast  . Osteoporosis Mother   . Heart disease Mother   . Osteoporosis Maternal Aunt   . Heart disease Father   . Stroke Father   . Esophageal varices Brother   . Stroke Other        FAMILY  . Esophageal varices Other        FAMILY   Social History   Socioeconomic History  . Marital status: Widowed    Spouse name: Not on file  . Number of children: 3  . Years of education: Not on file  . 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 Smoking status: Never Smoker  . Smokeless tobacco: Never Used  Vaping Use  . Vaping Use: Never used  Substance and Sexual Activity  . Alcohol use: No    Alcohol/week: 0.0 standard drinks  . Drug use: No  . Sexual activity: Not Currently  Other Topics Concern  . Not on file  Social History Narrative  . Not on file   Social Determinants of Health   Financial Resource Strain: Not on file  Food Insecurity: Not on file  Transportation  Needs: Not on file  Physical Activity: Not on file  Stress: Not on file  Social Connections: Not on file    Activities of Daily Living In your present state of health, do you have any difficulty performing the following activities: 08/29/2020 06/23/2020  Hearing? Y Y  Comment She has noticed when she is in a crowd it is difficult bilateral HA's.  Vision? N N  Comment Wear glasses -  Difficulty concentrating or making decisions? N N  Walking or climbing stairs? Y Y  Comment Due to back -  Dressing or bathing? N N  Doing errands, shopping? N N  Preparing Food and eating ? N -  Using the Toilet? N -  In the past six months, have you accidently leaked urine? N -  Do you have problems with loss of bowel control? N -  Managing your Medications? N -  Managing your Finances? N -  Housekeeping or managing your Housekeeping? N -  Some recent data might be hidden    Patient Education/ Literacy How often do you need to have someone help you when you read instructions, pamphlets, or other written materials from your doctor or pharmacy?: 2 - Rarely What is the last grade level you completed in school?: 12th  Exercise Current Exercise Habits: Home exercise routine, Type of exercise: walking, Time (Minutes): 25, Frequency (Times/Week): 2, Weekly Exercise (Minutes/Week): 50, Intensity: Mild  Diet Patient reports consuming 3 meals a day and 1 snack(s) a  day Patient reports that her primary diet is: Regular Patient reports that she does have regular access to food.   Depression Screen PHQ 2/9 Scores 08/29/2020 08/26/2020 05/27/2020 05/07/2020 03/07/2020 01/22/2020 09/17/2019  PHQ - 2 Score 1 1 1  0 2 2 2   PHQ- 9 Score 2 2 - - 2 2 2      Fall Risk Fall Risk  08/29/2020 08/26/2020 05/27/2020 05/07/2020 03/07/2020  Falls in the past year? 0 0 0 0 1  Number falls in past yr: - - - - 0  Comment - - - - -  Injury with Fall? - - - - 0  Risk Factor Category  - - - - -  Risk for fall due to : - - - - History of fall(s);Impaired balance/gait  Risk for fall due to: Comment - - - - -  Follow up - - - - Falls evaluation completed     Objective:  Brenda Solis seemed alert and oriented and she participated appropriately during our telephone visit.  Blood Pressure Weight BMI  BP Readings from Last 3 Encounters:  08/26/20 (!) 127/56  06/29/20 (!) 130/46  06/26/20 (!) 114/54   Wt Readings from Last 3 Encounters:  08/26/20 205 lb (93 kg)  06/29/20 205 lb (93 kg)  06/25/20 205 lb (93 kg)   BMI Readings from Last 1 Encounters:  08/26/20 31.17 kg/m    *Unable to obtain current vital signs, weight, and BMI due to telephone visit type  Hearing/Vision  . Brenda Solis did not seem to have difficulty with hearing/understanding during the telephone conversation . Reports that she has not had a formal eye exam by an eye care professional within the past year . Reports that she has not had a formal hearing evaluation within the past year *Unable to fully assess hearing and vision during telephone visit type  Cognitive Function: 6CIT Screen 08/29/2020 08/21/2019  What Year? 0 points 0 points  What month? 0 points 0 points  What time? 0 points 0  points  Count back from 20 0 points 0 points  Months in reverse 0 points 2 points  Repeat phrase 0 points 2 points  Total Score 0 4   (Normal:0-7, Significant for Dysfunction: >8)  Normal Cognitive Function Screening:  Yes   Immunization & Health Maintenance Record Immunization History  Administered Date(s) Administered  . Fluad Quad(high Dose 65+) 05/04/2019, 05/27/2020  . Influenza, High Dose Seasonal PF 05/06/2016, 04/14/2017, 03/30/2018  . Influenza,inj,Quad PF,6+ Mos 03/28/2013, 04/24/2014, 06/20/2015  . Moderna Sars-Covid-2 Vaccination 08/07/2019, 09/04/2019  . Pneumococcal Conjugate-13 10/16/2014  . Pneumococcal Polysaccharide-23 05/28/2006  . Td 06/29/1995, 05/26/2011  . Tdap 05/26/2011    Health Maintenance  Topic Date Due  . COVID-19 Vaccine (3 - Booster for Moderna series) 03/06/2020  . TETANUS/TDAP  05/25/2021  . INFLUENZA VACCINE  Completed  . DEXA SCAN  Completed  . PNA vac Low Risk Adult  Completed  . HPV VACCINES  Aged Out       Assessment  This is a routine wellness examination for Brenda Solis.  Health Maintenance: Due or Overdue Health Maintenance Due  Topic Date Due  . COVID-19 Vaccine (3 - Booster for Moderna series) 03/06/2020    Brenda Solis does not need a referral for Community Assistance: Care Management:   no Social Work:    no Prescription Assistance:  no Nutrition/Diabetes Education:  no   Plan:  Personalized Goals Goals Addressed            This Visit's Progress   . AWV       08/29/2020 AWV Goal: Fall Prevention  . Over the next year, patient will decrease their risk for falls by: o Using assistive devices, such as a cane or walker, as needed o Identifying fall risks within their home and correcting them by: - Removing throw rugs - Adding handrails to stairs or ramps - Removing clutter and keeping a clear pathway throughout the home - Increasing light, especially at night - Adding shower handles/bars - Raising toilet seat o Identifying potential personal risk factors for falls: - Medication side effects - Incontinence/urgency - Vestibular dysfunction - Hearing loss - Musculoskeletal disorders - Neurological disorders - Orthostatic  hypotension            Personalized Health Maintenance & Screening Recommendations  Yearly Eye Exam  Lung Cancer Screening Recommended: no (Low Dose CT Chest recommended if Age 55-80 years, 30 pack-year currently smoking OR have quit w/in past 15 years) Hepatitis C Screening recommended: no HIV Screening recommended: no  Advanced Directives: Written information was not prepared per patient's request.  Referrals & Orders No orders of the defined types were placed in this encounter.   Follow-up Plan . Follow-up with [name], DO as planned . Schedule yearly eye exam . AVS printed and mailed to patient   I have personally reviewed and noted the following in the patient's chart:   . Medical and social history . Use of alcohol, tobacco or illicit drugs  . Current medications and supplements . Functional ability and status . Nutritional status . Physical activity . Advanced directives . List of other physicians . Hospitalizations, surgeries, and ER visits in previous 12 months . Vitals . Screenings to include cognitive, depression, and falls . Referrals and appointments  In addition, I have reviewed and discussed with Brenda Solis certain preventive protocols, quality metrics, and best practice recommendations. A written personalized care plan for preventive services as well as general preventive health recommendations is available  and can be mailed to the patient at her request.      Lynnea Ferrier, LPN  0/02/2003

## 2020-09-16 ENCOUNTER — Telehealth: Payer: Self-pay

## 2020-09-16 NOTE — Telephone Encounter (Signed)
changed to [facility] on med list and favored.

## 2020-10-07 ENCOUNTER — Other Ambulatory Visit: Payer: Self-pay

## 2020-10-07 ENCOUNTER — Other Ambulatory Visit: Payer: Medicare Other

## 2020-10-07 DIAGNOSIS — E039 Hypothyroidism, unspecified: Secondary | ICD-10-CM | POA: Diagnosis not present

## 2020-10-07 DIAGNOSIS — N1831 Chronic kidney disease, stage 3a: Secondary | ICD-10-CM

## 2020-10-07 DIAGNOSIS — I10 Essential (primary) hypertension: Secondary | ICD-10-CM

## 2020-10-08 ENCOUNTER — Telehealth: Payer: Self-pay | Admitting: *Deleted

## 2020-10-08 DIAGNOSIS — M5136 Other intervertebral disc degeneration, lumbar region: Secondary | ICD-10-CM

## 2020-10-08 LAB — CBC
Hematocrit: 38.4 % (ref 34.0–46.6)
Hemoglobin: 12.6 g/dL (ref 11.1–15.9)
MCH: 29.6 pg (ref 26.6–33.0)
MCHC: 32.8 g/dL (ref 31.5–35.7)
MCV: 90 fL (ref 79–97)
Platelets: 236 10*3/uL (ref 150–450)
RBC: 4.26 x10E6/uL (ref 3.77–5.28)
RDW: 14.7 % (ref 11.7–15.4)
WBC: 7.2 10*3/uL (ref 3.4–10.8)

## 2020-10-08 LAB — CMP14+EGFR
ALT: 19 IU/L (ref 0–32)
AST: 24 IU/L (ref 0–40)
Albumin/Globulin Ratio: 2.1 (ref 1.2–2.2)
Albumin: 4.4 g/dL (ref 3.6–4.6)
Alkaline Phosphatase: 65 IU/L (ref 44–121)
BUN/Creatinine Ratio: 14 (ref 12–28)
BUN: 16 mg/dL (ref 8–27)
Bilirubin Total: 0.4 mg/dL (ref 0.0–1.2)
CO2: 20 mmol/L (ref 20–29)
Calcium: 10 mg/dL (ref 8.7–10.3)
Chloride: 102 mmol/L (ref 96–106)
Creatinine, Ser: 1.13 mg/dL — ABNORMAL HIGH (ref 0.57–1.00)
Globulin, Total: 2.1 g/dL (ref 1.5–4.5)
Glucose: 97 mg/dL (ref 65–99)
Potassium: 4.9 mmol/L (ref 3.5–5.2)
Sodium: 141 mmol/L (ref 134–144)
Total Protein: 6.5 g/dL (ref 6.0–8.5)
eGFR: 48 mL/min/{1.73_m2} — ABNORMAL LOW (ref >59)

## 2020-10-08 LAB — LIPID PANEL
Chol/HDL Ratio: 3.7 ratio (ref 0.0–4.4)
Cholesterol, Total: 184 mg/dL (ref 100–199)
HDL: 50 mg/dL (ref >39)
LDL Chol Calc (NIH): 116 mg/dL — ABNORMAL HIGH (ref 0–99)
Triglycerides: 101 mg/dL (ref 0–149)
VLDL Cholesterol Cal: 18 mg/dL (ref 5–40)

## 2020-10-08 LAB — THYROID PANEL WITH TSH
Free Thyroxine Index: 2.9 (ref 1.2–4.9)
T3 Uptake Ratio: 32 % (ref 24–39)
T4, Total: 9.2 ug/dL (ref 4.5–12.0)
TSH: 2.92 u[IU]/mL (ref 0.450–4.500)

## 2020-10-08 LAB — VITAMIN D 25 HYDROXY (VIT D DEFICIENCY, FRACTURES): Vit D, 25-Hydroxy: 26.7 ng/mL — ABNORMAL LOW (ref 30.0–100.0)

## 2020-10-08 MED ORDER — PREDNISONE 10 MG (21) PO TBPK: ORAL_TABLET | ORAL | 0 refills | Status: DC

## 2020-10-08 NOTE — Telephone Encounter (Signed)
She has reached out to spine Dr - Program changed in stimulator about 2 weeks ago, still not helping much - Dr thinks we they will have to keep making adjustments until they get it right.  She would love a dose pack for this flare up - [facility]

## 2020-10-08 NOTE — Telephone Encounter (Signed)
She is intolerant to Gabapentin, is on a muscle relaxer already.  I believe she is already taking scheduled Tylenol and she has a spinal stimulator in place.  Not sure what else can be done without an appointment.  Can send in a prednisone dosepak but this is not a sustainable medication.  Would only benefit her in an acute flareup of her back.  Has she reached out to her spine doctor?

## 2020-10-08 NOTE — Telephone Encounter (Signed)
Pt called and states that at last OV with Dr Darnell Level - back pain was discussed and she thought med was going to be given.  Discussed: lidocaine patch - not helping Ibu - was thinking she shouldn't use often  We talked about control meds needing an appt. - she is wondering if there is anything else she can try without appt?

## 2020-11-12 DIAGNOSIS — M1711 Unilateral primary osteoarthritis, right knee: Secondary | ICD-10-CM | POA: Diagnosis not present

## 2020-12-02 ENCOUNTER — Other Ambulatory Visit: Payer: Self-pay | Admitting: Family Medicine

## 2020-12-09 ENCOUNTER — Other Ambulatory Visit: Payer: Self-pay

## 2020-12-09 ENCOUNTER — Encounter: Payer: Self-pay | Admitting: Nurse Practitioner

## 2020-12-09 ENCOUNTER — Ambulatory Visit (INDEPENDENT_AMBULATORY_CARE_PROVIDER_SITE_OTHER): Payer: Medicare Other | Admitting: Nurse Practitioner

## 2020-12-09 VITALS — BP 151/65 | HR 77 | Ht 68.0 in | Wt 208.0 lb

## 2020-12-09 DIAGNOSIS — S30860A Insect bite (nonvenomous) of lower back and pelvis, initial encounter: Secondary | ICD-10-CM | POA: Diagnosis not present

## 2020-12-09 DIAGNOSIS — W57XXXA Bitten or stung by nonvenomous insect and other nonvenomous arthropods, initial encounter: Secondary | ICD-10-CM | POA: Diagnosis not present

## 2020-12-09 MED ORDER — DOXYCYCLINE HYCLATE 200 MG PO TBEC: 200.0000 mg | DELAYED_RELEASE_TABLET | Freq: Once | ORAL | 0 refills | Status: AC

## 2020-12-09 NOTE — Progress Notes (Signed)
Acute Office Visit  Subjective:    Patient ID: Brenda Solis, female    DOB: 10/05/1934, 86 y.o.   MRN: [id number]  Chief Complaint  Patient presents with   Tick Removal    Several days ago. Red and itching. Denies flu like symptoms or rash.    HPI Patient is a 86 year old female who presents to clinic today for tick bite.  Patient cannot remember how long tick was stuck on the skin.  Patient was able to remove tick from skin with alcohol and tweezer.  Patient is reporting red, irritated and painful rash lower back.  No fever, chills, nausea or any flulike symptoms.  Past Medical History:  Diagnosis Date   Depression    Heart murmur    Hyperlipidemia    Hypertension    Hypothyroidism    Insomnia, unspecified    Spondylosis of unspecified site without mention of myelopathy    Thyroid disease    hypothyroidism    Past Surgical History:  Procedure Laterality Date   APPENDECTOMY     CATARACT EXTRACTION, BILATERAL Bilateral 2006   [place] and [place] LAMINECTOMY LEVEL 1 N/A 02/01/2013   Procedure: CENTRAL DECOMPRESSIVE LUMBAR LAMINECTOMY LEVEL 1 L4-L5;  Surgeon: Tobi Bastos, MD;  Location: [facility];  Service: Orthopedics;  Laterality: N/A;   DECOMPRESSIVE LUMBAR LAMINECTOMY LEVEL 1 N/A 11/20/2014   Procedure: REVISION L4-S1 DECOMPRESSION, L4-L5 IN SITU FUSION (LEVEL 1);  Surgeon: Melina Schools, MD;  Location: [facility];  Service: Orthopedics;  Laterality: N/A;   KNEE SURGERY Right 2009   meniscus repair   SPINAL CORD STIMULATOR INSERTION N/A 06/25/2020   Procedure: SPINAL CORD STIMULATOR INSERTION;  Surgeon: Melina Schools, MD;  Location: [facility];  Service: Orthopedics;  Laterality: N/A;  3 hrs   TOTAL HIP ARTHROPLASTY  2011   RIGHT    Family History  Problem Relation Age of Onset   Coronary artery disease Brother        66s   Heart disease Brother    Cancer Mother        breast    Osteoporosis Mother    Heart disease Mother    Osteoporosis Maternal Aunt    Heart disease Father    Stroke Father    Esophageal varices Brother    Stroke Other        FAMILY   Esophageal varices Other        FAMILY    Social History   Socioeconomic History   Marital status: Widowed    Spouse name: Not on file   Number of children: 3   Years of education: Not on file   Highest education level: 12th grade  Occupational History    Employer: RETIRED    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  Tobacco Use   Smoking status: Never   Smokeless tobacco: Never  Vaping Use   Vaping Use: Never used  Substance and Sexual Activity   Alcohol use: No    Alcohol/week: 0.0 standard drinks   Drug use: No   Sexual activity: Not Currently  Other Topics Concern   Not on file  Social History Narrative   Not on file   Social Determinants of Health   Financial Resource Strain: Not on file  Food Insecurity: Not on file  Transportation Needs: Not on file  Physical Activity: Not on file  Stress: Not on file  Social Connections: Not on file  Intimate Partner Violence: Not on file    Outpatient Medications Prior to Visit  Medication Sig Dispense Refill   buPROPion (WELLBUTRIN XL) 300 MG 24 hr tablet Take 1 tablet (300 mg total) by mouth daily. 90 tablet 3   DULoxetine (CYMBALTA) 30 MG capsule Take 1 capsule (30 mg total) by mouth daily. CANCEL 32m. 90 capsule 3   ibuprofen (ADVIL) 400 MG tablet Take 1 tablet (400 mg total) by mouth daily as needed for moderate pain. Take with food and only IF NEEDED. 30 tablet 1   levothyroxine (SYNTHROID) 75 MCG tablet TAKE 1 TABLET BY MOUTH EVERY DAY BEFORE BREAKFAST (Patient taking differently: Take 75 mcg by mouth daily before breakfast. TAKE 1 TABLET BY MOUTH EVERY DAY BEFORE BREAKFAST) 90 tablet 3   lidocaine (LIDODERM) 5 % Place 1 patch onto the skin daily. Remove & Discard patch within 12 hours or as directed by MD 30 patch prn   Magnesium 250 MG TABS  Take by mouth. daily     meclizine (ANTIVERT) 25 MG tablet TAKE ONE TABLET BY MOUTH THREE TIMES DAILY AS NEEDED FOR DIZZINESS (Patient taking differently: Take 25 mg by mouth 3 (three) times daily as needed for dizziness. TAKE ONE TABLET BY MOUTH THREE TIMES DAILY AS NEEDED FOR DIZZINESS) 30 tablet 2   methocarbamol (ROBAXIN) 500 MG tablet Take 500 mg by mouth 3 (three) times daily as needed.     ondansetron (ZOFRAN) 4 MG tablet Take 1 tablet (4 mg total) by mouth every 8 (eight) hours as needed for nausea or vomiting. 20 tablet 0   predniSONE (STERAPRED UNI-PAK 21 TAB) 10 MG (21) TBPK tablet As directed x 6 days 21 tablet 0   traZODone (DESYREL) 50 MG tablet TAKE 1-2 TABLETS (50-100 MG TOTAL) BY MOUTH AT BEDTIME AS NEEDED FOR SLEEP. 180 tablet 1   Zinc 50 MG TABS Take by mouth.     No facility-administered medications prior to visit.    Allergies  Allergen Reactions   Gabapentin Other (See Comments)    Dizzy   Oxycodone Itching    Review of Systems  Constitutional: Negative.   HENT: Negative.    Respiratory: Negative.    Cardiovascular: Negative.   Gastrointestinal: Negative.   Skin:  Positive for rash.  All other systems reviewed and are negative.     Objective:    Physical Exam Vitals reviewed.  Constitutional:      Appearance: Normal appearance.  HENT:     Head: Normocephalic.     Nose: Nose normal.  Eyes:     Conjunctiva/sclera: Conjunctivae normal.  Cardiovascular:     Rate and Rhythm: Normal rate and regular rhythm.     Pulses: Normal pulses.     Heart sounds: Normal heart sounds.  Pulmonary:     Effort: Pulmonary effort is normal.     Breath sounds: Normal breath sounds.  Abdominal:     General: Bowel sounds are normal.  Skin:    Findings: Erythema and rash present.  Neurological:     Mental Status: She is alert and oriented to person, place, and time.  Psychiatric:        Behavior: Behavior normal.    BP (!) 151/65   Pulse 77   Ht _0  (1.727 m)    Wt 208 lb (94.3 kg)   SpO2 96%   BMI 31.63 kg/m  Wt Readings from Last 3 Encounters:  12/09/20 208 lb (94.3 kg)  08/26/20 205  lb (93 kg)  06/29/20 205 lb (93 kg)    Health Maintenance Due  Topic Date Due   Zoster Vaccines- Shingrix (1 of 2) Never done   COVID-19 Vaccine (4 - Booster for Moderna series) 10/07/2020    There are no preventive care reminders to display for this patient.   Lab Results  Component Value Date   TSH 2.920 10/07/2020   Lab Results  Component Value Date   WBC 7.2 10/07/2020   HGB 12.6 10/07/2020   HCT 38.4 10/07/2020   MCV 90 10/07/2020   PLT 236 10/07/2020   Lab Results  Component Value Date   NA 141 10/07/2020   K 4.9 10/07/2020   CO2 20 10/07/2020   GLUCOSE 97 10/07/2020   BUN 16 10/07/2020   CREATININE 1.13 (H) 10/07/2020   BILITOT 0.4 10/07/2020   ALKPHOS 65 10/07/2020   AST 24 10/07/2020   ALT 19 10/07/2020   PROT 6.5 10/07/2020   ALBUMIN 4.4 10/07/2020   CALCIUM 10.0 10/07/2020   ANIONGAP 10 06/23/2020   EGFR 48 (L) 10/07/2020   Lab Results  Component Value Date   CHOL 184 10/07/2020   Lab Results  Component Value Date   HDL 50 10/07/2020   Lab Results  Component Value Date   LDLCALC 116 (H) 10/07/2020   Lab Results  Component Value Date   TRIG 101 10/07/2020   Lab Results  Component Value Date   CHOLHDL 3.7 10/07/2020   No results found for: HGBA1C     Assessment & Plan:   Problem List Items Addressed This Visit       Musculoskeletal and Integument   Tick bite of lower back - Primary    .  Due to patient not remembering how long tick was stuck in skin, prophylaxis given by ordering doxycycline 200 mg tablet by mouth once.  Lyme ABS/Western blot in 2 to 3 weeks. Education provided to patient with printed handouts given.  Follow-up with worsening or unresolved symptoms. Patient verbalized understanding   Rx sent to pharmacy.       Relevant Medications   Doxycycline Hyclate 200 MG TBEC   Other  Relevant Orders   Lyme Disease Serology w/Reflex     Meds ordered this encounter  Medications   Doxycycline Hyclate 200 MG TBEC    Sig: Take 200 mg by mouth once for 1 dose.    Dispense:  1 tablet    Refill:  0    Order Specific Question:   Supervising Provider    Answer:   [name] [[id number]]      Ivy Lynn, NP

## 2020-12-09 NOTE — Patient Instructions (Signed)
Tick Bite Information, Adult  Ticks are insects that can bite. Most ticks live in shrubs and grassy areas. They climb onto people and animals that go by. Then they bite. Some ticks carrygerms that can make you sick. How can I prevent tick bites? Take these steps: Use insect repellent Use an insect repellent that has 20% or higher of the ingredients DEET, picaridin, or IR3535. Follow the instructions on the label. Put it on: Bare skin. The tops of your boots. Your pant legs. The ends of your sleeves. If you use an insect repellent that has the ingredient permethrin, follow the instructions on the label. Put it on: Clothing. Boots. Supplies or outdoor gear. Tents. When you are outside Wear long sleeves and long pants. Wear light-colored clothes. Tuck your pant legs into your socks. Stay in the middle of the trail. Do not touch the bushes. Avoid walking through long grass. Check for ticks on your clothes, hair, and skin often while you are outside. Before going inside your house, check your clothes, skin, head, neck, armpits, waist, groin, and joint areas. When you go indoors Check your clothes for ticks. Dry your clothes in a dryer on high heat for 10 minutes or more. If clothes are damp, additional time may be needed. Wash your clothes right away if they need to be washed. Use hot water. Check your pets and outdoor gear. Shower right away. Check your body for ticks. Do a full body check using a mirror. What is the right way to remove a tick? Remove the tick from your skin as soon as possible. Do not remove the tick with your bare fingers. To remove a tick that is crawling on your skin: Go outdoors and brush the tick off. Use tape or a lint roller. To remove a tick that is biting: Wash your hands. If you have latex gloves, put them on. Use tweezers, curved forceps, or a tick-removal tool to grasp the tick. Grasp the tick as close to your skin and as close to the tick's head as  possible. Gently pull up until the tick lets go. Try to keep the tick's head attached to its body. Do not twist or jerk the tick. Do not squeeze or crush the tick. Do not try to remove a tick with heat, alcohol, petroleum jelly, or fingernail polish. What should I do after taking out a tick? Throw away the tick. Do not crush a tick with your fingers. Clean the bite area and your hands with soap and water, rubbing alcohol, or an iodine wash. If an antiseptic cream or ointment is available, apply a small amount to the bite area. Wash and disinfect any instruments that you used to remove the tick. How should I get rid of a live tick? To dispose of a live tick, use one of these methods: Place the tick in rubbing alcohol. Place the tick in a bag or container you can close tightly. Wrap the tick tightly in tape. Flush the tick down the toilet. Contact a doctor if: You have symptoms, such as: A fever or chills. A red rash that makes a circle (bull's-eye rash) in the bite area. Redness and swelling where the tick bit you. Headache. Pain in a muscle, joint, or bone. Being more tired than normal. Trouble walking or moving your legs. Numbness in your legs. Tender and swollen lymph glands. A part of a tick breaks off and gets stuck in your skin. Get help right away if: You cannot remove a   tick. You cannot move (have paralysis) or feel weak. You are feeling worse or have new symptoms. You find a tick that is biting you and filled with blood. This is important if you are in an area where diseases from ticks are common. Summary Ticks may carry germs that can make you sick. To prevent tick bites wear long sleeves, long pants, and light colors. Use insect repellent. Follow the instructions on the label. If the tick is biting, do not try to remove it with heat, alcohol, petroleum jelly, or fingernail polish. Use tweezers, curved forceps, or a tick-removal tool to grasp the tick. Gently pull up  until the tick lets go. Do not twist or jerk the tick. Do not squeeze or crush the tick. If you have symptoms, contact a doctor. This information is not intended to replace advice given to you by your health care provider. Make sure you discuss any questions you have with your healthcare provider. Document Revised: 06/11/2019 Document Reviewed: 06/11/2019 Elsevier Patient Education  2022 Elsevier Inc.  

## 2020-12-09 NOTE — Assessment & Plan Note (Signed)
.    Due to patient not remembering how long tick was stuck in skin, prophylaxis given by ordering doxycycline 200 mg tablet by mouth once.  Lyme ABS/Western blot in 2 to 3 weeks. Education provided to patient with printed handouts given.  Follow-up with worsening or unresolved symptoms. Patient verbalized understanding   Rx sent to pharmacy.

## 2020-12-19 ENCOUNTER — Telehealth: Payer: Self-pay | Admitting: Family Medicine

## 2020-12-19 NOTE — Telephone Encounter (Signed)
lmtcb

## 2020-12-23 NOTE — Telephone Encounter (Signed)
Left message to call back  

## 2020-12-26 NOTE — Telephone Encounter (Signed)
After multiple attempts, call was not returned, encounter closed.

## 2020-12-31 ENCOUNTER — Telehealth: Payer: Self-pay

## 2020-12-31 NOTE — Telephone Encounter (Signed)
Patient is aware you are out of the office until next week but asked that I send this so you can review when you return.  Patient has heard that CBD gummies can be effective in helping to control pain and is wondering what your opinion is and if they would interfere with any medications she is taking.  Are there some that are better than others, what do you look for when deciding what to purchase?

## 2021-01-05 NOTE — Telephone Encounter (Signed)
There has not been any major studies that I know of for or against use of CBD products.  There is a slight chance that the products can contain THC, which could cause changes in her mood.  Since they are not regulated by the FDA, I don't have any advice on purity/ who would be best to purchase from.  I have heard patients that have used these products say they did help with various ailments including pain, however.

## 2021-01-07 NOTE — Telephone Encounter (Signed)
Pt aware of dr g recommendations

## 2021-01-21 DIAGNOSIS — M1711 Unilateral primary osteoarthritis, right knee: Secondary | ICD-10-CM | POA: Diagnosis not present

## 2021-01-28 DIAGNOSIS — M1711 Unilateral primary osteoarthritis, right knee: Secondary | ICD-10-CM | POA: Diagnosis not present

## 2021-02-04 DIAGNOSIS — M1711 Unilateral primary osteoarthritis, right knee: Secondary | ICD-10-CM | POA: Diagnosis not present

## 2021-02-10 DIAGNOSIS — M47896 Other spondylosis, lumbar region: Secondary | ICD-10-CM | POA: Diagnosis not present

## 2021-02-10 DIAGNOSIS — G8918 Other acute postprocedural pain: Secondary | ICD-10-CM | POA: Diagnosis not present

## 2021-03-14 DIAGNOSIS — M47816 Spondylosis without myelopathy or radiculopathy, lumbar region: Secondary | ICD-10-CM | POA: Diagnosis not present

## 2021-03-14 DIAGNOSIS — M47896 Other spondylosis, lumbar region: Secondary | ICD-10-CM | POA: Diagnosis not present

## 2021-03-20 DIAGNOSIS — M25561 Pain in right knee: Secondary | ICD-10-CM | POA: Diagnosis not present

## 2021-04-02 ENCOUNTER — Other Ambulatory Visit: Payer: Self-pay | Admitting: Family Medicine

## 2021-04-02 DIAGNOSIS — E039 Hypothyroidism, unspecified: Secondary | ICD-10-CM

## 2021-04-15 ENCOUNTER — Ambulatory Visit (INDEPENDENT_AMBULATORY_CARE_PROVIDER_SITE_OTHER): Payer: Medicare Other | Admitting: Family Medicine

## 2021-04-15 DIAGNOSIS — B9689 Other specified bacterial agents as the cause of diseases classified elsewhere: Secondary | ICD-10-CM

## 2021-04-15 DIAGNOSIS — J208 Acute bronchitis due to other specified organisms: Secondary | ICD-10-CM | POA: Diagnosis not present

## 2021-04-15 MED ORDER — GUAIFENESIN ER 600 MG PO TB12: 600.0000 mg | ORAL_TABLET | Freq: Two times a day (BID) | ORAL | 0 refills | Status: DC | PRN

## 2021-04-15 MED ORDER — AMOXICILLIN-POT CLAVULANATE 875-125 MG PO TABS: 1.0000 | ORAL_TABLET | Freq: Two times a day (BID) | ORAL | 0 refills | Status: DC

## 2021-04-15 NOTE — Progress Notes (Signed)
Telephone visit  Subjective: CC: URI PCP: [name], DO [id number] Brenda Solis is a 86 y.o. female calls for telephone consult today. Patient provides verbal consent for consult held via phone.  Due to COVID-19 pandemic this visit was conducted virtually. This visit type was conducted due to national recommendations for restrictions regarding the COVID-19 Pandemic (e.g. social distancing, sheltering in place) in an effort to limit this patient's exposure and mitigate transmission in our community. All issues noted in this document were discussed and addressed.  A physical exam was not performed with this format.   Location of patient: home Location of provider: [facility] Others present for call: none  1. URI She reports that a few weekends ago she went a trip and lost her voice.  Now, she has an awful cough that is minimally productive. No fever, nausea, vomiting, diarrhea, myalgia, shortness of breath or wheezing.  She has not tested for COVID 19 or flu.     ROS: Per HPI  Allergies  Allergen Reactions   Gabapentin Other (See Comments)    Dizzy   Oxycodone Itching   Past Medical History:  Diagnosis Date   Depression    Heart murmur    Hyperlipidemia    Hypertension    Hypothyroidism    Insomnia, unspecified    Spondylosis of unspecified site without mention of myelopathy    Thyroid disease    hypothyroidism    Current Outpatient Medications:    buPROPion (WELLBUTRIN XL) 300 MG 24 hr tablet, Take 1 tablet (300 mg total) by mouth daily., Disp: 90 tablet, Rfl: 3   DULoxetine (CYMBALTA) 30 MG capsule, Take 1 capsule (30 mg total) by mouth daily. CANCEL 20mg ., Disp: 90 capsule, Rfl: 3   ibuprofen (ADVIL) 400 MG tablet, Take 1 tablet (400 mg total) by mouth daily as needed for moderate pain. Take with food and only IF NEEDED., Disp: 30 tablet, Rfl: 1   levothyroxine (SYNTHROID) 75 MCG tablet, TAKE 1 TABLET BY MOUTH EVERY DAY BEFORE BREAKFAST, Disp: 90 tablet, Rfl: 1   lidocaine  (LIDODERM) 5 %, Place 1 patch onto the skin daily. Remove & Discard patch within 12 hours or as directed by MD, Disp: 30 patch, Rfl: prn   Magnesium 250 MG TABS, Take by mouth. daily, Disp: , Rfl:    meclizine (ANTIVERT) 25 MG tablet, TAKE ONE TABLET BY MOUTH THREE TIMES DAILY AS NEEDED FOR DIZZINESS (Patient taking differently: Take 25 mg by mouth 3 (three) times daily as needed for dizziness. TAKE ONE TABLET BY MOUTH THREE TIMES DAILY AS NEEDED FOR DIZZINESS), Disp: 30 tablet, Rfl: 2   methocarbamol (ROBAXIN) 500 MG tablet, Take 500 mg by mouth 3 (three) times daily as needed., Disp: , Rfl:    ondansetron (ZOFRAN) 4 MG tablet, Take 1 tablet (4 mg total) by mouth every 8 (eight) hours as needed for nausea or vomiting., Disp: 20 tablet, Rfl: 0   predniSONE (STERAPRED UNI-PAK 21 TAB) 10 MG (21) TBPK tablet, As directed x 6 days, Disp: 21 tablet, Rfl: 0   traZODone (DESYREL) 50 MG tablet, TAKE 1-2 TABLETS (50-100 MG TOTAL) BY MOUTH AT BEDTIME AS NEEDED FOR SLEEP., Disp: 180 tablet, Rfl: 1   Zinc 50 MG TABS, Take by mouth., Disp: , Rfl:   Assessment/ Plan: 86 y.o. female   Acute bacterial bronchitis - Plan: guaiFENesin (MUCINEX) 600 MG 12 hr tablet, amoxicillin-clavulanate (AUGMENTIN) 875-125 MG tablet, Veritor Flu A/B Waived, Novel Coronavirus, NAA ([facility])  Suspect acute bacterial bronchitis that likely  started out as viral.  Unfortunately not sure if she had flu or COVID virus but she demonstrates no other concerning symptoms or signs.  I have sent in empiric treatment with Augmentin and Mucinex.  Encourage p.o. hydration and monitoring for worsening signs or symptoms.  She will come in tomorrow for these test to be completed.  Start time: 11:05am End time: 11:11a  Total time spent on patient care (including telephone call/ virtual visit): 6 minutes  [place], [place] [phone]

## 2021-04-16 ENCOUNTER — Other Ambulatory Visit: Payer: Medicare Other

## 2021-04-16 DIAGNOSIS — B9689 Other specified bacterial agents as the cause of diseases classified elsewhere: Secondary | ICD-10-CM | POA: Diagnosis not present

## 2021-04-16 DIAGNOSIS — J208 Acute bronchitis due to other specified organisms: Secondary | ICD-10-CM | POA: Diagnosis not present

## 2021-04-16 LAB — VERITOR FLU A/B WAIVED
Influenza A: NEGATIVE
Influenza B: NEGATIVE

## 2021-04-16 NOTE — Addendum Note (Signed)
Addended by: Reather Converse on: 04/16/2021 02:24 PM   Modules accepted: Orders

## 2021-04-17 LAB — SARS-COV-2, NAA 2 DAY TAT

## 2021-04-17 LAB — NOVEL CORONAVIRUS, NAA: SARS-CoV-2, NAA: NOT DETECTED

## 2021-04-20 ENCOUNTER — Ambulatory Visit (INDEPENDENT_AMBULATORY_CARE_PROVIDER_SITE_OTHER): Payer: Medicare Other | Admitting: Family Medicine

## 2021-04-20 ENCOUNTER — Other Ambulatory Visit: Payer: Self-pay

## 2021-04-20 VITALS — BP 135/61 | HR 79 | Temp 97.1°F | Ht 68.0 in | Wt 200.8 lb

## 2021-04-20 DIAGNOSIS — E039 Hypothyroidism, unspecified: Secondary | ICD-10-CM

## 2021-04-20 DIAGNOSIS — Z23 Encounter for immunization: Secondary | ICD-10-CM

## 2021-04-20 DIAGNOSIS — N1831 Chronic kidney disease, stage 3a: Secondary | ICD-10-CM

## 2021-04-20 DIAGNOSIS — I10 Essential (primary) hypertension: Secondary | ICD-10-CM | POA: Diagnosis not present

## 2021-04-20 DIAGNOSIS — E559 Vitamin D deficiency, unspecified: Secondary | ICD-10-CM

## 2021-04-20 DIAGNOSIS — Z01818 Encounter for other preprocedural examination: Secondary | ICD-10-CM | POA: Diagnosis not present

## 2021-04-20 LAB — COAGUCHEK XS/INR WAIVED
INR: 1 (ref 0.9–1.1)
Prothrombin Time: 12.3 s

## 2021-04-20 NOTE — Progress Notes (Signed)
Pt is a 86 y.o. female who is here for preoperative clearance for total knee arthroplasty, scheduled 06/08/21.  PMHx CKD3a, HTN, Hypothyroidism. No chest pain, shortness of breath, dizziness, difficulty swallowing, heart palpitations.  She has not had postop complications in past including postop vomiting or difficulty rousing.  Anticipates need for inpatient rehab as she has no transportation for outpt rehab  1) High Risk Cardiac Conditions  1) Recent MI - No.  2) Decompensated Heart Failure - No.  3) Unstable angina - No.  4) Symptomatic arrythmia - No.  5) Sx Valvular Disease - No.  2) Intermediate Risk Factors -  CKD - Yes.    2) Functional Status - > 4 mets (Walk, run, climb stairs) Yes.  .   3) Surgery Specific Risk - Intermediate ( Orthopaedic )       4) Further Noninvasive evaluation -   1) EKG - Yes.     1) Hx of CVA, CAD, DM, CKD  2) Echo - No.   1) Worsening dyspnea   3) Stress Testing - Active Cardiac Disease - No.  5) Need for medical therapy - Beta Blocker, Statins indicated ? No.  PE: Vitals:   04/20/21 1416  BP: 135/61  Pulse: 79  Temp: (!) 97.1 F (36.2 C)  SpO2: 98%   Physical Examination: General appearance - alert, well appearing, and in no distress Mental status - alert, oriented to person, place, and time Eyes - pupils equal and reactive, extraocular eye movements intact Mouth - mucous membranes moist, pharynx normal without lesions, mallampati 2 Neck - supple, no significant adenopathy Chest - clear to auscultation, no wheezes, rales or rhonchi, symmetric air entry Heart - normal rate, regular rhythm, normal S1, S2, no murmurs, rubs, clicks or gallops  Pre-op evaluation - Plan: EKG 12-Lead, CoaguChek XS/INR Waived  Stage 3a chronic kidney disease (HCC) - Plan: EKG 12-Lead, CMP14+EGFR, CBC, CoaguChek XS/INR Waived, VITAMIN D 25 Hydroxy (Vit-D Deficiency, Fractures)  Vitamin D deficiency - Plan: VITAMIN D 25 Hydroxy (Vit-D Deficiency,  Fractures)  Primary hypertension - Plan: EKG 12-Lead, CMP14+EGFR  Acquired hypothyroidism - Plan: TSH, T4, Free  Need for immunization against influenza - Plan: Flu Vaccine QUAD High Dose(Fluad)  I have independently evaluated patient.  Joreen H Stowers is a 86 y.o. female who is low risk for a intermediate risk surgery.  There are not modifiable risk factors  Guliana H Neuenfeldt's RCRI/NSQIP calculation for MACE is: 0 (0.3%)  Anticipate need for inpatient rehab as she has no transportation for outpt rehab.  She is amenable to inpt services.  Will go ahead and do labs including her thyroid labs and cc to surgeon.      M. , DO [facility]          

## 2021-04-21 LAB — T4, FREE: Free T4: 1.52 ng/dL (ref 0.82–1.77)

## 2021-04-21 LAB — CBC
Hematocrit: 36.1 % (ref 34.0–46.6)
Hemoglobin: 12 g/dL (ref 11.1–15.9)
MCH: 29.2 pg (ref 26.6–33.0)
MCHC: 33.2 g/dL (ref 31.5–35.7)
MCV: 88 fL (ref 79–97)
Platelets: 246 10*3/uL (ref 150–450)
RBC: 4.11 x10E6/uL (ref 3.77–5.28)
RDW: 15.1 % (ref 11.7–15.4)
WBC: 8.7 10*3/uL (ref 3.4–10.8)

## 2021-04-21 LAB — CMP14+EGFR
ALT: 18 IU/L (ref 0–32)
AST: 23 IU/L (ref 0–40)
Albumin/Globulin Ratio: 1.9 (ref 1.2–2.2)
Albumin: 4.1 g/dL (ref 3.6–4.6)
Alkaline Phosphatase: 65 IU/L (ref 44–121)
BUN/Creatinine Ratio: 12 (ref 12–28)
BUN: 14 mg/dL (ref 8–27)
Bilirubin Total: 0.3 mg/dL (ref 0.0–1.2)
CO2: 21 mmol/L (ref 20–29)
Calcium: 9.6 mg/dL (ref 8.7–10.3)
Chloride: 103 mmol/L (ref 96–106)
Creatinine, Ser: 1.13 mg/dL — ABNORMAL HIGH (ref 0.57–1.00)
Globulin, Total: 2.2 g/dL (ref 1.5–4.5)
Glucose: 96 mg/dL (ref 70–99)
Potassium: 4.3 mmol/L (ref 3.5–5.2)
Sodium: 137 mmol/L (ref 134–144)
Total Protein: 6.3 g/dL (ref 6.0–8.5)
eGFR: 48 mL/min/{1.73_m2} — ABNORMAL LOW (ref >59)

## 2021-04-21 LAB — TSH: TSH: 1.18 u[IU]/mL (ref 0.450–4.500)

## 2021-04-21 LAB — VITAMIN D 25 HYDROXY (VIT D DEFICIENCY, FRACTURES): Vit D, 25-Hydroxy: 37.1 ng/mL (ref 30.0–100.0)

## 2021-04-29 DIAGNOSIS — M25561 Pain in right knee: Secondary | ICD-10-CM | POA: Diagnosis not present

## 2021-05-18 DIAGNOSIS — M1711 Unilateral primary osteoarthritis, right knee: Secondary | ICD-10-CM | POA: Insufficient documentation

## 2021-05-28 NOTE — Patient Instructions (Signed)
DUE TO COVID-19 ONLY ONE VISITOR IS ALLOWED TO COME WITH YOU AND STAY IN THE WAITING ROOM ONLY DURING PRE OP AND PROCEDURE.   **NO VISITORS ARE ALLOWED IN THE SHORT STAY AREA OR RECOVERY ROOM!!**  IF YOU WILL BE ADMITTED INTO THE HOSPITAL YOU ARE ALLOWED ONLY TWO SUPPORT PEOPLE DURING VISITATION HOURS ONLY (7 AM -8PM)   The support person(s) must pass our screening, gel in and out, and wear a mask at all times, including in the patient's room. Patients must also wear a mask when staff or their support person are in the room. Visitors GUEST BADGE MUST BE WORN VISIBLY  One adult visitor may remain with you overnight and MUST be in the room by 8 P.M.  No visitors under the age of 34. Any visitor under the age of 105 must be accompanied by an adult.    COVID SWAB TESTING MUST BE COMPLETED ON:  06/04/21              8A - 3P **MUST PRESENT COMPLETED FORM AT TESTING SITE**    [place] (backside of the building) You are not required to quarantine, however you are required to wear a well-fitted mask when you are out and around people not in your household.  Hand Hygiene often Do NOT share personal items Notify your provider if you are in close contact with someone who has COVID or you develop fever 100.4 or greater, new onset of sneezing, cough, sore throat, shortness of breath or body aches.  [address], [address], must go inside of the hospital, NOT A DRIVE THRU!  (Must self quarantine after testing. Follow instructions on handout.)       Your procedure is scheduled on: 06/08/21   Report to [facility] Main Entrance    Report to admitting at : 9:50 AM   Call this number if you have problems the morning of surgery [phone]   Do not eat food :After Midnight.   May have liquids until: 9:30 AM    day of surgery  CLEAR LIQUID DIET  Foods Allowed                                                                      Foods Excluded  Water, Black Coffee and tea, regular and decaf                             liquids that you cannot  Plain Jell-O in any flavor  (No red)                                           see through such as: Fruit ices (not with fruit pulp)                                     milk, soups, orange juice              Iced  Popsicles (No red)                                    All solid food                                   Apple juices Sports drinks like Gatorade (No red) Lightly seasoned clear broth or consume(fat free) Sugar  Sample Menu Breakfast                                Lunch                                     Supper Cranberry juice                    Beef broth                            Chicken broth Jell-O                                     Grape juice                           Apple juice Coffee or tea                        Jell-O                                      Popsicle                                                Coffee or tea                        Coffee or tea      Complete one Ensure drink the morning of surgery at : 9:30      the day of surgery.   The day of surgery:  Drink ONE (1) Pre-Surgery Clear Ensure or G2 by am the morning of surgery. Drink in one sitting. Do not sip.  This drink was given to you during your hospital  pre-op appointment visit. Nothing else to drink after completing the  Pre-Surgery Clear Ensure or G2.          If you have questions, please contact your surgeon's office.     Oral Hygiene is also important to reduce your risk of infection.                                    Remember - BRUSH YOUR TEETH THE MORNING OF SURGERY WITH YOUR REGULAR TOOTHPASTE   Do NOT smoke after Midnight   Take these medicines the morning of  surgery with A SIP OF WATER: bupropion,duloxetine,synthroid  DO NOT TAKE ANY ORAL DIABETIC MEDICATIONS DAY OF YOUR SURGERY                              You may not have any metal on  your body including hair pins, jewelry, and body piercing             Do not wear make-up, lotions, powders, perfumes/cologne, or deodorant  Do not wear nail polish including gel and S&S, artificial/acrylic nails, or any other type of covering on natural nails including finger and toenails. If you have artificial nails, gel coating, etc. that needs to be removed by a nail salon please have this removed prior to surgery or surgery may need to be canceled/ delayed if the surgeon/ anesthesia feels like they are unable to be safely monitored.   Do not shave  48 hours prior to surgery.    Do not bring valuables to the hospital. [place].   Contacts, dentures or bridgework may not be worn into surgery.   Bring small overnight bag day of surgery.    Patients discharged on the day of surgery will not be allowed to drive home.   Special Instructions: Bring a copy of your healthcare power of attorney and living will documents         the day of surgery if you haven't scanned them before.              Please read over the following fact sheets you were given: IF YOU HAVE QUESTIONS ABOUT YOUR PRE-OP INSTRUCTIONS PLEASE CALL [phone]     [facility] - Preparing for Surgery Before surgery, you can play an important role.  Because skin is not sterile, your skin needs to be as free of germs as possible.  You can reduce the number of germs on your skin by washing with CHG (chlorahexidine gluconate) soap before surgery.  CHG is an antiseptic cleaner which kills germs and bonds with the skin to continue killing germs even after washing. Please DO NOT use if you have an allergy to CHG or antibacterial soaps.  If your skin becomes reddened/irritated stop using the CHG and inform your nurse when you arrive at Short Stay. Do not shave (including legs and underarms) for at least 48 hours prior to the first CHG shower.  You may shave your face/neck. Please follow  these instructions carefully:  1.  Shower with CHG Soap the night before surgery and the  morning of Surgery.  2.  If you choose to wash your hair, wash your hair first as usual with your  normal  shampoo.  3.  After you shampoo, rinse your hair and body thoroughly to remove the  shampoo.                           4.  Use CHG as you would any other liquid soap.  You can apply chg directly  to the skin and wash                       Gently with a scrungie or clean washcloth.  5.  Apply the CHG Soap to your body ONLY FROM THE NECK DOWN.   Do not use on  face/ open                           Wound or open sores. Avoid contact with eyes, ears mouth and genitals (private parts).                       Wash face,  Genitals (private parts) with your normal soap.             6.  Wash thoroughly, paying special attention to the area where your surgery  will be performed.  7.  Thoroughly rinse your body with warm water from the neck down.  8.  DO NOT shower/wash with your normal soap after using and rinsing off  the CHG Soap.                9.  Pat yourself dry with a clean towel.            10.  Wear clean pajamas.            11.  Place clean sheets on your bed the night of your first shower and do not  sleep with pets. Day of Surgery : Do not apply any lotions/deodorants the morning of surgery.  Please wear clean clothes to the hospital/surgery center.  FAILURE TO FOLLOW THESE INSTRUCTIONS MAY RESULT IN THE CANCELLATION OF YOUR SURGERY PATIENT SIGNATURE_________________________________  NURSE SIGNATURE__________________________________  ________________________________________________________________________   Brenda Solis  An incentive spirometer is a tool that can help keep your lungs clear and active. This tool measures how well you are filling your lungs with each breath. Taking long deep breaths may help reverse or decrease the chance of developing breathing (pulmonary) problems  (especially infection) following: A long period of time when you are unable to move or be active. BEFORE THE PROCEDURE  If the spirometer includes an indicator to show your best effort, your nurse or respiratory therapist will set it to a desired goal. If possible, sit up straight or lean slightly forward. Try not to slouch. Hold the incentive spirometer in an upright position. INSTRUCTIONS FOR USE  Sit on the edge of your bed if possible, or sit up as far as you can in bed or on a chair. Hold the incentive spirometer in an upright position. Breathe out normally. Place the mouthpiece in your mouth and seal your lips tightly around it. Breathe in slowly and as deeply as possible, raising the piston or the ball toward the top of the column. Hold your breath for 3-5 seconds or for as long as possible. Allow the piston or ball to fall to the bottom of the column. Remove the mouthpiece from your mouth and breathe out normally. Rest for a few seconds and repeat Steps 1 through 7 at least 10 times every 1-2 hours when you are awake. Take your time and take a few normal breaths between deep breaths. The spirometer may include an indicator to show your best effort. Use the indicator as a goal to work toward during each repetition. After each set of 10 deep breaths, practice coughing to be sure your lungs are clear. If you have an incision (the cut made at the time of surgery), support your incision when coughing by placing a pillow or rolled up towels firmly against it. Once you are able to get out of bed, walk around indoors and cough well. You may stop using the incentive spirometer when instructed by your  caregiver.  RISKS AND COMPLICATIONS Take your time so you do not get dizzy or light-headed. If you are in pain, you may need to take or ask for pain medication before doing incentive spirometry. It is harder to take a deep breath if you are having pain. AFTER USE Rest and breathe slowly and  easily. It can be helpful to keep track of a log of your progress. Your caregiver can provide you with a simple table to help with this. If you are using the spirometer at home, follow these instructions: [place] IF:  You are having difficultly using the spirometer. You have trouble using the spirometer as often as instructed. Your pain medication is not giving enough relief while using the spirometer. You develop fever of 100.5 F (38.1 C) or higher. SEEK IMMEDIATE MEDICAL CARE IF:  You cough up bloody sputum that had not been present before. You develop fever of 102 F (38.9 C) or greater. You develop worsening pain at or near the incision site. MAKE SURE YOU:  Understand these instructions. Will watch your condition. Will get help right away if you are not doing well or get worse. Document Released: 10/25/2006 Document Revised: 09/06/2011 Document Reviewed: 12/26/2006 [facility] Patient Information 2014 [place], [place].   ________________________________________________________________________

## 2021-05-29 ENCOUNTER — Other Ambulatory Visit: Payer: Self-pay

## 2021-05-29 ENCOUNTER — Encounter (HOSPITAL_COMMUNITY)
Admission: RE | Admit: 2021-05-29 | Discharge: 2021-05-29 | Disposition: A | Discharge status: Home / Self Care | Payer: Medicare Other | Source: Ambulatory Visit | Attending: Orthopedic Surgery | Admitting: Orthopedic Surgery

## 2021-05-29 ENCOUNTER — Encounter (HOSPITAL_COMMUNITY): Payer: Self-pay

## 2021-05-29 VITALS — BP 153/65 | HR 72 | Temp 98.4°F | Ht 68.0 in | Wt 193.0 lb

## 2021-05-29 DIAGNOSIS — Z01812 Encounter for preprocedural laboratory examination: Secondary | ICD-10-CM | POA: Insufficient documentation

## 2021-05-29 DIAGNOSIS — Z01818 Encounter for other preprocedural examination: Secondary | ICD-10-CM

## 2021-05-29 HISTORY — DX: Anxiety disorder, unspecified: F41.9

## 2021-05-29 LAB — CBC
HCT: 39.3 % (ref 36.0–46.0)
Hemoglobin: 12.6 g/dL (ref 12.0–15.0)
MCH: 29.1 pg (ref 26.0–34.0)
MCHC: 32.1 g/dL (ref 30.0–36.0)
MCV: 90.8 fL (ref 80.0–100.0)
Platelets: 228 10*3/uL (ref 150–400)
RBC: 4.33 MIL/uL (ref 3.87–5.11)
RDW: 15.2 % (ref 11.5–15.5)
WBC: 8.5 10*3/uL (ref 4.0–10.5)
nRBC: 0 % (ref 0.0–0.2)

## 2021-05-29 LAB — SURGICAL PCR SCREEN
MRSA, PCR: NEGATIVE
Staphylococcus aureus: NEGATIVE

## 2021-05-29 LAB — COMPREHENSIVE METABOLIC PANEL
ALT: 19 U/L (ref 0–44)
AST: 25 U/L (ref 15–41)
Albumin: 4.7 g/dL (ref 3.5–5.0)
Alkaline Phosphatase: 53 U/L (ref 38–126)
Anion gap: 9 (ref 5–15)
BUN: 19 mg/dL (ref 8–23)
CO2: 23 mmol/L (ref 22–32)
Calcium: 9.9 mg/dL (ref 8.9–10.3)
Chloride: 104 mmol/L (ref 98–111)
Creatinine, Ser: 1.12 mg/dL — ABNORMAL HIGH (ref 0.44–1.00)
GFR, Estimated: 48 mL/min — ABNORMAL LOW (ref >60)
Glucose, Bld: 98 mg/dL (ref 70–99)
Potassium: 4.5 mmol/L (ref 3.5–5.1)
Sodium: 136 mmol/L (ref 135–145)
Total Bilirubin: 0.7 mg/dL (ref 0.3–1.2)
Total Protein: 7.3 g/dL (ref 6.5–8.1)

## 2021-05-29 LAB — PROTIME-INR
INR: 1.1 (ref 0.8–1.2)
Prothrombin Time: 14 seconds (ref 11.4–15.2)

## 2021-05-29 NOTE — Progress Notes (Signed)
COVID Vaccine Completed: Yes Date COVID Vaccine completed: 07/09/20. X 3 COVID vaccine manufacturer:   Moderna    COVID Test: 06/04/21 PCP - DO: Ronnie Doss. LOV: 04/20/21. : Clearance: Epic,Chart Cardiologist -   Chest x-ray - 06/23/20 EKG - 04/20/21 Stress Test -  ECHO - 11/29/13 Cardiac Cath -  Pacemaker/ICD device last checked:  Sleep Study -  CPAP -   Fasting Blood Sugar -  Checks Blood Sugar _____ times a day  Blood Thinner Instructions: Aspirin Instructions: Last Dose:  Anesthesia review: Hx: Heart murmur,HTN  Patient denies shortness of breath, fever, cough and chest pain at PAT appointment   Patient verbalized understanding of instructions that were given to them at the PAT appointment. Patient was also instructed that they will need to review over the PAT instructions again at home before surgery.

## 2021-06-03 NOTE — H&P (Signed)
TOTAL KNEE ADMISSION H&P  Patient is being admitted for right total knee arthroplasty.  Subjective:  Chief Complaint: Right knee pain.  HPI: Brenda Solis, 86 y.o. female has a history of pain and functional disability in the right knee due to arthritis and has failed non-surgical conservative treatments for greater than 12 weeks to include corticosteriod injections and activity modification. Onset of symptoms was gradual, starting  several  years ago with gradually worsening course since that time. The patient noted no past surgery on the right knee.  Patient currently rates pain in the right knee at 8 out of 10 with activity. Patient has night pain, pain that interferes with activities of daily living, pain with passive range of motion, and crepitus. Patient has evidence of  bone-on-bone arthritis in the lateral compartment with valgus deformity. She also has patellofemoral narrowing and spurring  by imaging studies. There is no active infection.  Patient Active Problem List   Diagnosis Date Noted   Chronic pain 06/25/2020   Obsessional thoughts    Mood disorder ([place])    Osteopenia 04/24/2014   Hyperlipidemia with target LDL less than 100 02/01/2014   Depression 02/01/2014   Spinal stenosis, lumbar region, with neurogenic claudication 02/01/2013   Hypertension 11/17/2010   Hypothyroidism 10/14/2010   Insomnia 10/14/2010   Gastritis 10/14/2010    Past Medical History:  Diagnosis Date   Anxiety    Depression    Heart murmur    Hyperlipidemia    Hypertension    Hypothyroidism    Insomnia, unspecified    Spondylosis of unspecified site without mention of myelopathy    Thyroid disease    hypothyroidism    Past Surgical History:  Procedure Laterality Date   APPENDECTOMY     CATARACT EXTRACTION, BILATERAL Bilateral 2006   [place] LEVEL 1 N/A 02/01/2013   Procedure:  CENTRAL DECOMPRESSIVE LUMBAR LAMINECTOMY LEVEL 1 L4-L5;  Surgeon: Tobi Bastos, MD;  Location: [facility];  Service: Orthopedics;  Laterality: N/A;   DECOMPRESSIVE LUMBAR LAMINECTOMY LEVEL 1 N/A 11/20/2014   Procedure: REVISION L4-S1 DECOMPRESSION, L4-L5 IN SITU FUSION (LEVEL 1);  Surgeon: Melina Schools, MD;  Location: [place];  Service: Orthopedics;  Laterality: N/A;   KNEE SURGERY Right 2009   meniscus repair   SPINAL CORD STIMULATOR INSERTION N/A 06/25/2020   Procedure: SPINAL CORD STIMULATOR INSERTION;  Surgeon: Melina Schools, MD;  Location: [place];  Service: Orthopedics;  Laterality: N/A;  3 hrs   TOTAL HIP ARTHROPLASTY  2011   RIGHT    Prior to Admission medications   Medication Sig Start Date End Date Taking? Authorizing Provider  buPROPion (WELLBUTRIN XL) 300 MG 24 hr tablet Take 1 tablet (300 mg total) by mouth daily. 08/26/20  Yes Ronnie Doss M, DO  Cholecalciferol (VITAMIN D3 PO) Take 1 tablet by mouth daily.   Yes [provider]  DULoxetine (CYMBALTA) 30 MG capsule Take 1 capsule (30 mg total) by mouth daily. CANCEL 20mg . 03/07/20  Yes Gottschalk, Ashly M, DO  levothyroxine (SYNTHROID) 75 MCG tablet TAKE 1 TABLET BY MOUTH EVERY DAY BEFORE BREAKFAST 04/02/21  Yes Gottschalk, Ashly M, DO  meclizine (ANTIVERT) 25 MG tablet TAKE ONE TABLET BY MOUTH THREE TIMES DAILY AS NEEDED FOR DIZZINESS 01/18/18  Yes Terald Sleeper, PA-C  methocarbamol (ROBAXIN) 500 MG tablet Take 500 mg by mouth 3 (three) times daily as needed  for muscle spasms. 07/03/20  Yes [provider]  Multiple Vitamin (MULTIVITAMIN WITH MINERALS) TABS tablet Take 1 tablet by mouth daily.   Yes [provider]  traZODone (DESYREL) 50 MG tablet TAKE 1-2 TABLETS (50-100 MG TOTAL) BY MOUTH AT BEDTIME AS NEEDED FOR SLEEP. Patient taking differently: Take 100 mg by mouth at bedtime. 12/02/20  Yes Gottschalk, Leatrice Jewels M, DO  ibuprofen (ADVIL) 400 MG tablet Take 1 tablet (400 mg total) by mouth daily as needed for  moderate pain. Take with food and only IF NEEDED. 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  Tobacco Use   Smoking status: Never   Smokeless tobacco: Never  Vaping Use   Vaping Use: Never used  Substance and Sexual Activity   Alcohol use: No    Alcohol/week: 0.0 standard drinks   Drug use: No   Sexual activity: Not Currently  Other Topics Concern   Not on file  Social History Narrative   Not on file   Social Determinants of Health   Financial Resource Strain: Not on file  Food Insecurity: Not on file  Transportation Needs: Not on file  Physical Activity: Not on file  Stress: Not on file  Social Connections: Not on file  Intimate Partner Violence: Not on file    Tobacco Use: Low Risk    Smoking Tobacco Use: Never   Smokeless Tobacco Use: Never   Passive Exposure: Not on file   Social History   Substance and Sexual Activity  Alcohol Use No   Alcohol/week: 0.0 standard drinks    Family History  Problem Relation Age of Onset   Coronary artery disease Brother        53s   Heart disease Brother    Cancer Mother        breast   Osteoporosis Mother    Heart disease Mother    Osteoporosis Maternal Aunt    Heart disease Father    Stroke Father    Esophageal varices Brother    Stroke Other        FAMILY   Esophageal varices Other        FAMILY    Review of Systems  Constitutional:  Negative for chills and fever.  HENT:  Negative for congestion, sore throat and tinnitus.   Eyes:  Negative for double vision, photophobia and pain.  Respiratory:  Negative  for cough, shortness of breath and wheezing.   Cardiovascular:  Negative for chest pain, palpitations and orthopnea.  Gastrointestinal:  Negative for heartburn, nausea and vomiting.  Genitourinary:  Negative for dysuria, frequency and urgency.  Musculoskeletal:  Positive for joint pain.  Neurological:  Negative for dizziness, weakness and headaches.   Objective:  Physical Exam: Well nourished and well developed.  General: Alert and oriented x3, cooperative and pleasant, no acute distress.  Head: normocephalic, atraumatic, neck supple.  Eyes: EOMI.  Respiratory: breath sounds clear in all fields, no wheezing, rales, or rhonchi. Cardiovascular: Regular rate and rhythm, no murmurs, gallops or  rubs.  Abdomen: non-tender to palpation and soft, normoactive bowel sounds. Musculoskeletal:  Right Knee Exam:   Valgus deformity.   No effusion.   Range of motion is 5 to 125 degrees.   Moderate crepitus on range of motion of the knee.   Positive lateral greater than medial joint line tenderness.   Stable knee.   Calves soft and nontender. Motor function intact in LE. Strength 5/5 LE bilaterally. Neuro: Distal pulses 2+. Sensation to light touch intact in LE.  Imaging Review Plain radiographs demonstrate severe degenerative joint disease of the right knee. The overall alignment is neutral. The bone quality appears to be adequate for age and reported activity level.  Assessment/Plan:  End stage arthritis, right knee   The patient history, physical examination, clinical judgment of the provider and imaging studies are consistent with end stage degenerative joint disease of the right knee and total knee arthroplasty is deemed medically necessary. The treatment options including medical management, injection therapy arthroscopy and arthroplasty were discussed at length. The risks and benefits of total knee arthroplasty were presented and reviewed. The risks due to aseptic loosening, infection,  stiffness, patella tracking problems, thromboembolic complications and other imponderables were discussed. The patient acknowledged the explanation, agreed to proceed with the plan and consent was signed. Patient is being admitted for inpatient treatment for surgery, pain control, PT, OT, prophylactic antibiotics, VTE prophylaxis, progressive ambulation and ADLs and discharge planning. The patient is planning to be discharged  home .   Patient's anticipated LOS is less than 2 midnights, meeting these requirements: - Lives within 1 hour of care - Has a competent adult at home to recover with post-op recover - NO history of  - Diabetes  - Coronary Artery Disease  - Heart failure  - Heart attack  - Stroke  - DVT/VTE  - Cardiac arrhythmia  - Respiratory Failure/COPD  - Renal failure  - Anemia  - Advanced Liver disease  Therapy Plans: Outpatient therapy at [facility]) Disposition: Home with family Planned DVT Prophylaxis: Aspirin 325 mg BID DME Needed: None PCP: Ronnie Doss, MD (clearance received) TXA: IV Allergies: Gabapentin, oxycodone (itching) Anesthesia Concerns: None BMI: 31.8 Last HgbA1c: Not diabetic.  Pharmacy: [facility])  Other: - Allergy to oxycodone, discussed increasing dosage of Norco postoperatively. On 5-325, does not take daily. - Spinal stimulator - May need to stay an additional day. Lives alone, will have family rotating in to help  - Patient was instructed on what medications to stop prior to surgery. - Follow-up visit in 2 weeks with Dr. Wynelle Link - Begin physical therapy following surgery - Pre-operative lab work as pre-surgical testing - Prescriptions will be provided in hospital at time of discharge  Theresa Duty, PA-C Orthopedic Surgery [facility] [place]

## 2021-06-04 ENCOUNTER — Encounter: Payer: Self-pay | Admitting: Family Medicine

## 2021-06-04 ENCOUNTER — Ambulatory Visit (INDEPENDENT_AMBULATORY_CARE_PROVIDER_SITE_OTHER): Payer: Medicare Other

## 2021-06-04 ENCOUNTER — Ambulatory Visit (INDEPENDENT_AMBULATORY_CARE_PROVIDER_SITE_OTHER): Payer: Medicare Other | Admitting: Family Medicine

## 2021-06-04 VITALS — BP 128/65 | HR 75 | Temp 98.0°F | Ht 68.0 in | Wt 193.0 lb

## 2021-06-04 DIAGNOSIS — R051 Acute cough: Secondary | ICD-10-CM | POA: Diagnosis not present

## 2021-06-04 DIAGNOSIS — J101 Influenza due to other identified influenza virus with other respiratory manifestations: Secondary | ICD-10-CM

## 2021-06-04 DIAGNOSIS — R509 Fever, unspecified: Secondary | ICD-10-CM

## 2021-06-04 DIAGNOSIS — J439 Emphysema, unspecified: Secondary | ICD-10-CM | POA: Diagnosis not present

## 2021-06-04 DIAGNOSIS — R0602 Shortness of breath: Secondary | ICD-10-CM

## 2021-06-04 DIAGNOSIS — J1100 Influenza due to unidentified influenza virus with unspecified type of pneumonia: Secondary | ICD-10-CM | POA: Diagnosis not present

## 2021-06-04 DIAGNOSIS — R059 Cough, unspecified: Secondary | ICD-10-CM | POA: Diagnosis not present

## 2021-06-04 LAB — VERITOR FLU A/B WAIVED
Influenza A: POSITIVE — AB
Influenza B: NEGATIVE

## 2021-06-04 MED ORDER — OSELTAMIVIR PHOSPHATE 75 MG PO CAPS: 75.0000 mg | ORAL_CAPSULE | Freq: Two times a day (BID) | ORAL | 0 refills | Status: AC

## 2021-06-04 MED ORDER — GUAIFENESIN ER 600 MG PO TB12: 600.0000 mg | ORAL_TABLET | Freq: Two times a day (BID) | ORAL | 0 refills | Status: AC

## 2021-06-04 MED ORDER — AMOXICILLIN-POT CLAVULANATE 875-125 MG PO TABS: 1.0000 | ORAL_TABLET | Freq: Two times a day (BID) | ORAL | 0 refills | Status: AC

## 2021-06-04 MED ORDER — AZITHROMYCIN 250 MG PO TABS: ORAL_TABLET | ORAL | 0 refills | Status: DC

## 2021-06-04 MED ORDER — METHYLPREDNISOLONE ACETATE 40 MG/ML IJ SUSP
40.0000 mg | Freq: Once | INTRAMUSCULAR | Status: AC
  Administered 2021-06-04: 40 mg via INTRAMUSCULAR

## 2021-06-04 NOTE — Progress Notes (Signed)
Subjective:  Patient ID: Brenda Solis, female    DOB: 06-19-1935, 86 y.o.   MRN: [id number]  Patient Care Team: [name], DO as PCP - General (Family Medicine)   Chief Complaint:  Shortness of Breath (Cough/Congestion/Weakness/Ache is chest from coughing/Post-nasal drip/Home covid - NEGATIVE)   HPI: Brenda Solis is a 86 y.o. female presenting on 06/04/2021 for Shortness of Breath (Cough/Congestion/Weakness/Ache is chest from coughing/Post-nasal drip/Home covid - NEGATIVE)   Patient presents today with complaints of cough, shortness of breath, fever, weakness, myalgias, and postnasal drip.  She took a home COVID test which was negative.  She feels that her shortness of breath and weakness are worsening.  She denies any recent sick exposures.  Shortness of Breath This is a new problem. The current episode started yesterday. The problem has been rapidly worsening. Associated symptoms include coryza, a fever, rhinorrhea, a sore throat, sputum production and wheezing. Pertinent negatives include no abdominal pain, chest pain, claudication, ear pain, headaches, hemoptysis, leg pain, leg swelling, neck pain, orthopnea, PND, rash, swollen glands, syncope or vomiting. The symptoms are aggravated by any activity. She has tried nothing for the symptoms.       Relevant past medical, surgical, family, and social history reviewed and updated as indicated.  Allergies and medications reviewed and updated. Data reviewed: Chart in Epic.   Past Medical History:  Diagnosis Date   Anxiety    Depression    Heart murmur    Hyperlipidemia    Hypertension    Hypothyroidism    Insomnia, unspecified    Spondylosis of unspecified site without mention of myelopathy    Thyroid disease    hypothyroidism    Past Surgical History:  Procedure Laterality Date   APPENDECTOMY     CATARACT EXTRACTION, BILATERAL Bilateral 2006   [place] and [place] LAMINECTOMY LEVEL 1 N/A 02/01/2013   Procedure: CENTRAL DECOMPRESSIVE LUMBAR LAMINECTOMY LEVEL 1 L4-L5;  Surgeon: Tobi Bastos, MD;  Location: [facility];  Service: Orthopedics;  Laterality: N/A;   DECOMPRESSIVE LUMBAR LAMINECTOMY LEVEL 1 N/A 11/20/2014   Procedure: REVISION L4-S1 DECOMPRESSION, L4-L5 IN SITU FUSION (LEVEL 1);  Surgeon: Melina Schools, MD;  Location: [place];  Service: Orthopedics;  Laterality: N/A;   KNEE SURGERY Right 2009   meniscus repair   SPINAL CORD STIMULATOR INSERTION N/A 06/25/2020   Procedure: SPINAL CORD STIMULATOR INSERTION;  Surgeon: Melina Schools, MD;  Location: [place];  Service: Orthopedics;  Laterality: N/A;  3 hrs   TOTAL HIP ARTHROPLASTY  2011   RIGHT    Social History   Socioeconomic History   Marital status: Widowed    Spouse name: Not on file   Number of children: 3   Years of education: Not on file   Highest education level: 12th grade  Occupational History    Employer: RETIRED    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  Tobacco Use   Smoking status: Never   Smokeless tobacco: Never  Vaping Use   Vaping Use: Never used  Substance and Sexual Activity   Alcohol use: No    Alcohol/week: 0.0 standard drinks   Drug use: No   Sexual activity: Not Currently  Other Topics Concern   Not on file  Social History Narrative   Not on file   Social Determinants of Health   Financial Resource Strain: Not on file  Food  Insecurity: Not on file  Transportation Needs: Not on file  Physical Activity: Not on file  Stress: Not on file  Social Connections: Not on file  Intimate Partner Violence: Not on file    Outpatient Encounter Medications as of 06/04/2021  Medication Sig   amoxicillin-clavulanate (AUGMENTIN) 875-125 MG tablet Take 1 tablet by mouth 2 (two) times daily for 7 days.   azithromycin (ZITHROMAX Z-PAK) 250 MG tablet As directed   buPROPion (WELLBUTRIN XL) 300 MG 24 hr tablet Take 1 tablet (300 mg total)  by mouth daily.   Cholecalciferol (VITAMIN D3 PO) Take 1 tablet by mouth daily.   DULoxetine (CYMBALTA) 30 MG capsule Take 1 capsule (30 mg total) by mouth daily. CANCEL 20mg .   guaiFENesin (MUCINEX) 600 MG 12 hr tablet Take 1 tablet (600 mg total) by mouth 2 (two) times daily for 10 days.   levothyroxine (SYNTHROID) 75 MCG tablet TAKE 1 TABLET BY MOUTH EVERY DAY BEFORE BREAKFAST   meclizine (ANTIVERT) 25 MG tablet TAKE ONE TABLET BY MOUTH THREE TIMES DAILY AS NEEDED FOR DIZZINESS   methocarbamol (ROBAXIN) 500 MG tablet Take 500 mg by mouth 3 (three) times daily as needed for muscle spasms.   Multiple Vitamin (MULTIVITAMIN WITH MINERALS) TABS tablet Take 1 tablet by mouth daily.   oseltamivir (TAMIFLU) 75 MG capsule Take 1 capsule (75 mg total) by mouth 2 (two) times daily for 5 days.   traZODone (DESYREL) 50 MG tablet TAKE 1-2 TABLETS (50-100 MG TOTAL) BY MOUTH AT BEDTIME AS NEEDED FOR SLEEP. (Patient taking differently: Take 100 mg by mouth at bedtime.)   [DISCONTINUED] ibuprofen (ADVIL) 400 MG tablet Take 1 tablet (400 mg total) by mouth daily as needed for moderate pain. Take with food and only IF NEEDED.   No facility-administered encounter medications on file as of 06/04/2021.    Allergies  Allergen Reactions   Gabapentin Other (See Comments)    Dizzy   Oxycodone Itching    Review of Systems  Constitutional:  Positive for activity change, appetite change, chills, fatigue and fever. Negative for diaphoresis and unexpected weight change.  HENT:  Positive for congestion, postnasal drip, rhinorrhea and sore throat. Negative for ear pain.   Respiratory:  Positive for sputum production, shortness of breath and wheezing. Negative for hemoptysis.   Cardiovascular:  Negative for chest pain, palpitations, orthopnea, claudication, leg swelling, syncope and PND.  Gastrointestinal:  Negative for abdominal pain and vomiting.  Genitourinary:  Negative for decreased urine volume and difficulty  urinating.  Musculoskeletal:  Positive for arthralgias, gait problem (uses cane) and myalgias. Negative for back pain, joint swelling and neck pain.  Skin:  Negative for rash.  Neurological:  Positive for weakness. Negative for dizziness, tremors, seizures, syncope, facial asymmetry, speech difficulty, light-headedness, numbness and headaches.  Psychiatric/Behavioral:  Negative for confusion.   All other systems reviewed and are negative.      Objective:  BP 128/65   Pulse 75   Temp 98 F (36.7 C)   Ht 5\' 8"  (1.727 m)   Wt 193 lb (87.5 kg)   SpO2 96%   BMI 29.35 kg/m    Wt Readings from Last 3 Encounters:  06/04/21 193 lb (87.5 kg)  05/29/21 193 lb (87.5 kg)  04/20/21 200 lb 12.8 oz (91.1 kg)    Physical Exam Vitals and nursing note reviewed.  Constitutional:      General: She is not in acute distress.    Appearance: Normal appearance. She is well-developed and well-groomed. She is  not ill-appearing, toxic-appearing or diaphoretic.  HENT:     Head: Normocephalic and atraumatic.     Jaw: There is normal jaw occlusion.     Right Ear: Hearing, tympanic membrane, ear canal and external ear normal.     Left Ear: Hearing, tympanic membrane, ear canal and external ear normal.     Nose: Nose normal.     Mouth/Throat:     Lips: Pink.     Mouth: Mucous membranes are moist.     Pharynx: Oropharynx is clear. Uvula midline. Posterior oropharyngeal erythema present. No pharyngeal swelling, oropharyngeal exudate or uvula swelling.     Tonsils: No tonsillar exudate or tonsillar abscesses.  Eyes:     General: Lids are normal.     Extraocular Movements: Extraocular movements intact.     Conjunctiva/sclera: Conjunctivae normal.     Pupils: Pupils are equal, round, and reactive to light.  Neck:     Thyroid: No thyroid mass, thyromegaly or thyroid tenderness.     Vascular: No carotid bruit or JVD.     Trachea: Trachea and phonation normal.  Cardiovascular:     Rate and Rhythm: Normal  rate and regular rhythm.     Chest Wall: PMI is not displaced.     Pulses: Normal pulses.     Heart sounds: Normal heart sounds. No murmur heard.   No friction rub. No gallop.  Pulmonary:     Effort: Respiratory distress (with exertion) present.     Breath sounds: No stridor. Examination of the right-middle field reveals rhonchi. Examination of the right-lower field reveals rhonchi. Examination of the left-lower field reveals rhonchi. Rhonchi present. No decreased breath sounds, wheezing or rales.  Chest:     Chest wall: No mass, deformity, tenderness, crepitus or edema. There is no dullness to percussion.  Abdominal:     General: Bowel sounds are normal. There is no distension or abdominal bruit.     Palpations: Abdomen is soft. There is no hepatomegaly or splenomegaly.     Tenderness: There is no abdominal tenderness. There is no right CVA tenderness or left CVA tenderness.     Hernia: No hernia is present.  Musculoskeletal:        General: Normal range of motion.     Cervical back: Normal range of motion and neck supple.     Right lower leg: No edema.     Left lower leg: No edema.  Lymphadenopathy:     Cervical: No cervical adenopathy.  Skin:    General: Skin is warm and dry.     Capillary Refill: Capillary refill takes less than 2 seconds.     Coloration: Skin is not cyanotic, jaundiced or pale.     Findings: No rash.  Neurological:     General: No focal deficit present.     Mental Status: She is alert and oriented to person, place, and time.     Sensory: Sensation is intact.     Motor: Motor function is intact.     Coordination: Coordination is intact.     Gait: Gait abnormal (slow, uses cane).     Deep Tendon Reflexes: Reflexes are normal and symmetric.  Psychiatric:        Attention and Perception: Attention and perception normal.        Mood and Affect: Mood and affect normal.        Speech: Speech normal.        Behavior: Behavior normal. Behavior is cooperative.  Thought Content: Thought content normal.        Cognition and Memory: Cognition and memory normal.        Judgment: Judgment normal.    Results for orders placed or performed during the hospital encounter of 05/29/21  Surgical pcr screen   Specimen: Nasal Mucosa; Nasal Swab  Result Value Ref Range   MRSA, PCR NEGATIVE NEGATIVE   Staphylococcus aureus NEGATIVE NEGATIVE  CBC  Result Value Ref Range   WBC 8.5 4.0 - 10.5 K/uL   RBC 4.33 3.87 - 5.11 MIL/uL   Hemoglobin 12.6 12.0 - 15.0 g/dL   HCT 39.3 36.0 - 46.0 %   MCV 90.8 80.0 - 100.0 fL   MCH 29.1 26.0 - 34.0 pg   MCHC 32.1 30.0 - 36.0 g/dL   RDW 15.2 11.5 - 15.5 %   Platelets 228 150 - 400 K/uL   nRBC 0.0 0.0 - 0.2 %  Comprehensive metabolic panel  Result Value Ref Range   Sodium 136 135 - 145 mmol/L   Potassium 4.5 3.5 - 5.1 mmol/L   Chloride 104 98 - 111 mmol/L   CO2 23 22 - 32 mmol/L   Glucose, Bld 98 70 - 99 mg/dL   BUN 19 8 - 23 mg/dL   Creatinine, Ser 1.12 (H) 0.44 - 1.00 mg/dL   Calcium 9.9 8.9 - 10.3 mg/dL   Total Protein 7.3 6.5 - 8.1 g/dL   Albumin 4.7 3.5 - 5.0 g/dL   AST 25 15 - 41 U/L   ALT 19 0 - 44 U/L   Alkaline Phosphatase 53 38 - 126 U/L   Total Bilirubin 0.7 0.3 - 1.2 mg/dL   GFR, Estimated 48 (L) >60 mL/min   Anion gap 9 5 - 15  Protime-INR  Result Value Ref Range   Prothrombin Time 14.0 11.4 - 15.2 seconds   INR 1.1 0.8 - 1.2     X-Ray: CXR: slight increase in consolidation in RLL. Preliminary x-ray reading by Monia Pouch, FNP-C, [facility].  Influenza A positive.  Pertinent labs & imaging results that were available during my care of the patient were reviewed by me and considered in my medical decision making.  Assessment & Plan:  Brenda Solis was seen today for shortness of breath.  Diagnoses and all orders for this visit:  Acute cough SHOB Fever and chills Influenza A positive in office.  COVID test pending.  Chest x-ray concerning for increasing consolidation in right lower lobe along with  clinical symptoms of pneumonia.  Will initiate Tamiflu.  Patient aware to take for 2 days and if symptoms are not improving or worsening start antibiotics as prescribed.  Mucinex twice daily with lots of fluids.  Patient aware to follow-up in 2 weeks for reevaluation or sooner if needed.  Patient aware to report any new, worsening, or persistent symptoms. -     DG Chest 2 View; Future -     Veritor Flu A/B Waived -     Novel Coronavirus, NAA ([facility]) -     guaiFENesin (MUCINEX) 600 MG 12 hr tablet; Take 1 tablet (600 mg total) by mouth 2 (two) times daily for 10 days.  Pneumonia and influenza -     guaiFENesin (MUCINEX) 600 MG 12 hr tablet; Take 1 tablet (600 mg total) by mouth 2 (two) times daily for 10 days. -     amoxicillin-clavulanate (AUGMENTIN) 875-125 MG tablet; Take 1 tablet by mouth 2 (two) times daily for 7 days. -  azithromycin (ZITHROMAX Z-PAK) 250 MG tablet; As directed  Influenza A -     oseltamivir (TAMIFLU) 75 MG capsule; Take 1 capsule (75 mg total) by mouth 2 (two) times daily for 5 days.     Continue all other maintenance medications.  Follow up plan: Return in about 2 weeks (around 06/18/2021), or if symptoms worsen or fail to improve.   Continue healthy lifestyle choices, including diet (rich in fruits, vegetables, and lean proteins, and low in salt and simple carbohydrates) and exercise (at least 30 minutes of moderate physical activity daily).  Educational handout given for influenza  The above assessment and management plan was discussed with the patient. The patient verbalized understanding of and has agreed to the management plan. Patient is aware to call the clinic if they develop any new symptoms or if symptoms persist or worsen. Patient is aware when to return to the clinic for a follow-up visit. Patient educated on when it is appropriate to go to the emergency department.   Monia Pouch, FNP-C [facility] [phone]

## 2021-06-05 LAB — NOVEL CORONAVIRUS, NAA: SARS-CoV-2, NAA: NOT DETECTED

## 2021-06-06 ENCOUNTER — Other Ambulatory Visit: Payer: Self-pay | Admitting: Family Medicine

## 2021-06-11 ENCOUNTER — Ambulatory Visit: Payer: Medicare Other | Admitting: Physical Therapy

## 2021-06-12 ENCOUNTER — Other Ambulatory Visit: Payer: Self-pay | Admitting: Family Medicine

## 2021-06-12 ENCOUNTER — Telehealth: Payer: Self-pay | Admitting: Family Medicine

## 2021-06-12 DIAGNOSIS — R051 Acute cough: Secondary | ICD-10-CM

## 2021-06-12 MED ORDER — BENZONATATE 100 MG PO CAPS: 100.0000 mg | ORAL_CAPSULE | Freq: Three times a day (TID) | ORAL | 0 refills | Status: DC | PRN

## 2021-06-12 NOTE — Telephone Encounter (Signed)
Was seen 12/8 by Rakes. Please advise and send to pools

## 2021-06-12 NOTE — Telephone Encounter (Signed)
Pt aware.

## 2021-07-06 ENCOUNTER — Telehealth: Payer: Self-pay | Admitting: Family Medicine

## 2021-07-06 ENCOUNTER — Encounter: Payer: Self-pay | Admitting: Nurse Practitioner

## 2021-07-06 ENCOUNTER — Ambulatory Visit (INDEPENDENT_AMBULATORY_CARE_PROVIDER_SITE_OTHER): Payer: Medicare Other | Admitting: Nurse Practitioner

## 2021-07-06 DIAGNOSIS — R059 Cough, unspecified: Secondary | ICD-10-CM | POA: Insufficient documentation

## 2021-07-06 DIAGNOSIS — R058 Other specified cough: Secondary | ICD-10-CM

## 2021-07-06 MED ORDER — GUAIFENESIN ER 600 MG PO TB12: 600.0000 mg | ORAL_TABLET | Freq: Two times a day (BID) | ORAL | 0 refills | Status: DC

## 2021-07-06 MED ORDER — BENZONATATE 100 MG PO CAPS: 200.0000 mg | ORAL_CAPSULE | Freq: Three times a day (TID) | ORAL | 0 refills | Status: DC | PRN

## 2021-07-06 NOTE — Telephone Encounter (Signed)
Patient has scheduled telephone visit for 6 pm.

## 2021-07-06 NOTE — Patient Instructions (Signed)

## 2021-07-06 NOTE — Progress Notes (Signed)
° °  Virtual Visit  Note Due to COVID-19 pandemic this visit was conducted virtually. This visit type was conducted due to national recommendations for restrictions regarding the COVID-19 Pandemic (e.g. social distancing, sheltering in place) in an effort to limit this patient's exposure and mitigate transmission in our community. All issues noted in this document were discussed and addressed.  A physical exam was not performed with this format.  I connected with Brenda Solis on 07/06/21 at 11:27 am  by telephone and verified that I am speaking with the correct person using two identifiers. Brenda Solis is currently located at home  during visit. The provider, Ivy Lynn, NP is located in their office at time of visit.  I discussed the limitations, risks, security and privacy concerns of performing an evaluation and management service by telephone and the availability of in person appointments. I also discussed with the patient that there may be a patient responsible charge related to this service. The patient expressed understanding and agreed to proceed.   History and Present Illness:  Cough This is a new problem. The current episode started in the past 7 days. The problem has been unchanged. The problem occurs constantly. The cough is Non-productive. Pertinent negatives include no ear congestion, ear pain, fever, headaches or nasal congestion. She has tried OTC cough suppressant and prescription cough suppressant for the symptoms. The treatment provided no relief.     Review of Systems  Constitutional:  Negative for fever.  HENT:  Negative for ear pain.   Respiratory:  Positive for cough.   Neurological:  Negative for headaches.  All other systems reviewed and are negative.   Observations/Objective: Tele visit patient is not in distress  Assessment and Plan:  Take medication as prescribed - Use a cool mist humidifier  -Use saline nose sprays frequently -Force fluids -For  fever or aches or pains- take Tylenol or ibuprofen. -Tessalon pearls 200 mg tablet by mouth for cough -Continue Mucinex -If symptoms do not improve, she may need to be COVID tested to rule this out Follow up with worsening unresolved symptoms   Follow Up Instructions: Follow up with unresolved symptoms    I discussed the assessment and treatment plan with the patient. The patient was provided an opportunity to ask questions and all were answered. The patient agreed with the plan and demonstrated an understanding of the instructions.   The patient was advised to call back or seek an in-person evaluation if the symptoms worsen or if the condition fails to improve as anticipated.  The above assessment and management plan was discussed with the patient. The patient verbalized understanding of and has agreed to the management plan. Patient is aware to call the clinic if symptoms persist or worsen. Patient is aware when to return to the clinic for a follow-up visit. Patient educated on when it is appropriate to go to the emergency department.   Time call ended: 11:38 AM  I provided 11 minutes of  non face-to-face time during this encounter.    Ivy Lynn, NP

## 2021-07-06 NOTE — Telephone Encounter (Signed)
Pt is continuing to cough and would like to know if steroids would help. Please call.

## 2021-07-07 ENCOUNTER — Other Ambulatory Visit: Payer: Self-pay | Admitting: *Deleted

## 2021-07-07 ENCOUNTER — Other Ambulatory Visit: Payer: Self-pay

## 2021-07-07 DIAGNOSIS — R051 Acute cough: Secondary | ICD-10-CM

## 2021-07-07 DIAGNOSIS — R058 Other specified cough: Secondary | ICD-10-CM

## 2021-07-09 ENCOUNTER — Other Ambulatory Visit: Payer: Self-pay

## 2021-07-09 ENCOUNTER — Ambulatory Visit (INDEPENDENT_AMBULATORY_CARE_PROVIDER_SITE_OTHER): Payer: Medicare Other

## 2021-07-09 DIAGNOSIS — R059 Cough, unspecified: Secondary | ICD-10-CM | POA: Diagnosis not present

## 2021-07-09 DIAGNOSIS — J439 Emphysema, unspecified: Secondary | ICD-10-CM | POA: Diagnosis not present

## 2021-07-09 DIAGNOSIS — R051 Acute cough: Secondary | ICD-10-CM

## 2021-07-24 ENCOUNTER — Encounter: Payer: Self-pay | Admitting: Family Medicine

## 2021-07-24 ENCOUNTER — Ambulatory Visit (INDEPENDENT_AMBULATORY_CARE_PROVIDER_SITE_OTHER): Payer: Medicare Other | Admitting: Family Medicine

## 2021-07-24 VITALS — BP 138/64 | HR 74 | Temp 98.2°F | Ht 68.0 in | Wt 189.0 lb

## 2021-07-24 DIAGNOSIS — M79602 Pain in left arm: Secondary | ICD-10-CM | POA: Diagnosis not present

## 2021-07-24 DIAGNOSIS — M7542 Impingement syndrome of left shoulder: Secondary | ICD-10-CM | POA: Diagnosis not present

## 2021-07-24 MED ORDER — PREDNISONE 20 MG PO TABS: ORAL_TABLET | ORAL | 0 refills | Status: DC

## 2021-07-24 NOTE — Progress Notes (Signed)
Subjective:  Patient ID: Brenda Solis, female    DOB: [birth year], 87 y.o.   MRN: [id number]  Patient Care Team: [name], DO as PCP - General (Family Medicine)   Chief Complaint:  left arm pain (X 2 weeks)   HPI: Brenda Solis is a 87 y.o. female presenting on 07/24/2021 for left arm pain (X 2 weeks)   Shoulder Pain  The pain is present in the left arm and left shoulder. This is a new problem. The current episode started 1 to 4 weeks ago. There has been no history of extremity trauma. The problem occurs constantly. The problem has been waxing and waning. The quality of the pain is described as aching and dull. The pain is at a severity of 5/10. The pain is moderate. Associated symptoms include a limited range of motion and stiffness. Pertinent negatives include no fever, inability to bear weight, itching, joint locking, joint swelling, numbness or tingling. The symptoms are aggravated by activity and lying down. She has tried acetaminophen for the symptoms. The treatment provided no relief. Her past medical history is significant for osteoarthritis.      Relevant past medical, surgical, family, and social history reviewed and updated as indicated.  Allergies and medications reviewed and updated. Data reviewed: Chart in Epic.   Past Medical History:  Diagnosis Date   Anxiety    Depression    Heart murmur    Hyperlipidemia    Hypertension    Hypothyroidism    Insomnia, unspecified    Spondylosis of unspecified site without mention of myelopathy    Thyroid disease    hypothyroidism    Past Surgical History:  Procedure Laterality Date   APPENDECTOMY     CATARACT EXTRACTION, BILATERAL Bilateral 2006   [place] LAMINECTOMY LEVEL 1 N/A 02/01/2013   Procedure: CENTRAL DECOMPRESSIVE LUMBAR LAMINECTOMY LEVEL 1 L4-L5;  Surgeon: Tobi Bastos, MD;  Location: [facility];   Service: Orthopedics;  Laterality: N/A;   DECOMPRESSIVE LUMBAR LAMINECTOMY LEVEL 1 N/A 11/20/2014   Procedure: REVISION L4-S1 DECOMPRESSION, L4-L5 IN SITU FUSION (LEVEL 1);  Surgeon: Melina Schools, MD;  Location: [facility];  Service: Orthopedics;  Laterality: N/A;   KNEE SURGERY Right 2009   meniscus repair   SPINAL CORD STIMULATOR INSERTION N/A 06/25/2020   Procedure: SPINAL CORD STIMULATOR INSERTION;  Surgeon: Melina Schools, MD;  Location: [facility];  Service: Orthopedics;  Laterality: N/A;  3 hrs   TOTAL HIP ARTHROPLASTY  2011   RIGHT    Social History   Socioeconomic History   Marital status: Widowed    Spouse name: Not on file   Number of children: 3   Years of education: Not on file   Highest education level: 12th grade  Occupational History    Employer: RETIRED    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  Tobacco Use   Smoking status: Never   Smokeless tobacco: Never  Vaping Use   Vaping Use: Never used  Substance and Sexual Activity   Alcohol use: No    Alcohol/week: 0.0 standard drinks   Drug use: No   Sexual activity: Not Currently  Other Topics Concern   Not on file  Social History Narrative   Not on file   Social Determinants of Health   Financial Resource Strain: Not on file  Food Insecurity: Not on file  Transportation Needs:  Not on file  Physical Activity: Not on file  Stress: Not on file  Social Connections: Not on file  Intimate Partner Violence: Not on file    Outpatient Encounter Medications as of 07/24/2021  Medication Sig   benzonatate (TESSALON PERLES) 100 MG capsule Take 2 capsules (200 mg total) by mouth 3 (three) times daily as needed for cough.   buPROPion (WELLBUTRIN XL) 300 MG 24 hr tablet Take 1 tablet (300 mg total) by mouth daily.   Cholecalciferol (VITAMIN D3 PO) Take 1 tablet by mouth daily.   DULoxetine (CYMBALTA) 30 MG capsule Take 1 capsule (30 mg total) by mouth daily. CANCEL 20mg . (Patient taking differently: Take 30 mg by mouth daily.)    guaiFENesin (MUCINEX) 600 MG 12 hr tablet Take 1 tablet (600 mg total) by mouth 2 (two) times daily.   levothyroxine (SYNTHROID) 75 MCG tablet TAKE 1 TABLET BY MOUTH EVERY DAY BEFORE BREAKFAST   meclizine (ANTIVERT) 25 MG tablet TAKE ONE TABLET BY MOUTH THREE TIMES DAILY AS NEEDED FOR DIZZINESS   methocarbamol (ROBAXIN) 500 MG tablet Take 500 mg by mouth 3 (three) times daily as needed for muscle spasms.   Multiple Vitamin (MULTIVITAMIN WITH MINERALS) TABS tablet Take 1 tablet by mouth daily.   predniSONE (DELTASONE) 20 MG tablet 2 po at sametime daily for 5 days- start tomorrow   traZODone (DESYREL) 50 MG tablet TAKE 1-2 TABLETS BY MOUTH AT BEDTIME AS NEEDED FOR SLEEP.   [DISCONTINUED] azithromycin (ZITHROMAX Z-PAK) 250 MG tablet As directed   No facility-administered encounter medications on file as of 07/24/2021.    Allergies  Allergen Reactions   Gabapentin Other (See Comments)    Dizzy   Oxycodone Itching    Review of Systems  Constitutional:  Negative for activity change, appetite change, chills, diaphoresis, fatigue, fever and unexpected weight change.  HENT: Negative.    Eyes: Negative.  Negative for photophobia and visual disturbance.  Respiratory:  Negative for cough, chest tightness and shortness of breath.   Cardiovascular:  Negative for chest pain, palpitations and leg swelling.  Gastrointestinal:  Negative for abdominal pain, blood in stool, constipation, diarrhea, nausea and vomiting.  Endocrine: Negative.   Genitourinary:  Negative for decreased urine volume, difficulty urinating, dysuria, frequency and urgency.  Musculoskeletal:  Positive for arthralgias, gait problem and stiffness. Negative for back pain, joint swelling, myalgias, neck pain and neck stiffness.  Skin: Negative.  Negative for itching.  Allergic/Immunologic: Negative.   Neurological:  Negative for dizziness, tingling, weakness, numbness and headaches.  Hematological: Negative.   Psychiatric/Behavioral:   Negative for confusion, hallucinations, sleep disturbance and suicidal ideas.   All other systems reviewed and are negative.      Objective:  BP 138/64    Pulse 74    Temp 98.2 F (36.8 C)    Ht 5\' 8"  (1.727 m)    Wt 189 lb (85.7 kg)    SpO2 98%    BMI 28.74 kg/m    Wt Readings from Last 3 Encounters:  07/24/21 189 lb (85.7 kg)  06/04/21 193 lb (87.5 kg)  05/29/21 193 lb (87.5 kg)    Physical Exam Vitals and nursing note reviewed.  Constitutional:      General: She is not in acute distress.    Appearance: Normal appearance. She is not ill-appearing, toxic-appearing or diaphoretic.  HENT:     Head: Normocephalic and atraumatic.  Eyes:     Conjunctiva/sclera: Conjunctivae normal.     Pupils: Pupils are equal, round, and reactive  to light.  Cardiovascular:     Rate and Rhythm: Normal rate and regular rhythm.     Pulses: Normal pulses.     Heart sounds: Normal heart sounds.  Pulmonary:     Effort: Pulmonary effort is normal.     Breath sounds: Normal breath sounds.  Musculoskeletal:     Right shoulder: Normal.     Left shoulder: Tenderness present. No swelling, deformity, effusion, laceration, bony tenderness or crepitus. Decreased range of motion. Normal strength. Normal pulse.     Right upper arm: Normal.     Left upper arm: Tenderness present. No swelling, edema, deformity, lacerations or bony tenderness.     Cervical back: Normal.     Comments: Left shoulder: limited abduction and adduction. Positive Neers and Hawkins sing, negative empty can sign  Skin:    General: Skin is warm and dry.     Capillary Refill: Capillary refill takes less than 2 seconds.  Neurological:     General: No focal deficit present.     Mental Status: She is alert and oriented to person, place, and time.     Gait: Gait abnormal (antalgic, using cane).    Results for orders placed or performed in visit on 06/04/21  Novel Coronavirus, NAA ([facility])   Specimen: Nasopharyngeal(NP) swabs in vial  transport medium  Result Value Ref Range   SARS-CoV-2, NAA Not Detected Not Detected  Veritor Flu A/B Waived  Result Value Ref Range   Influenza A Positive (A) Negative   Influenza B Negative Negative     EKG: SR, 75, PR 158 ms, QT 352 ms, no acute ST-T changes, no ectopy, LVH voltage criteria, no changes from prior EKG. Monia Pouch, FNP-C.  Previous CXR reviewed and left shoulder joint space appears normal.   Pertinent labs & imaging results that were available during my care of the patient were reviewed by me and considered in my medical decision making.  Assessment & Plan:  Yanai was seen today for left arm pain.  Diagnoses and all orders for this visit:  Left arm pain Impingement syndrome of left shoulder Due  to age, EKG was completed and unremarkable for acute or ischemic changes. No indication of frozen shoulder. No history of trauma. Impingement syndrome. Will burst with steroids for antiinflammatory properties and refer to PT. Symptomatic care discussed in detail. Report any new, worsening, or persistent symotims.  -     EKG 12-Lead -     Ambulatory referral to Physical Therapy -     predniSONE (DELTASONE) 20 MG tablet; 2 po at sametime daily for 5 days- start tomorrow    Continue all other maintenance medications.  Follow up plan: Return in about 6 weeks (around 09/04/2021), or if symptoms worsen or fail to improve, for shoulder pain.   Continue healthy lifestyle choices, including diet (rich in fruits, vegetables, and lean proteins, and low in salt and simple carbohydrates) and exercise (at least 30 minutes of moderate physical activity daily).  Educational handout given for shoulder pain  The above assessment and management plan was discussed with the patient. The patient verbalized understanding of and has agreed to the management plan. Patient is aware to call the clinic if they develop any new symptoms or if symptoms persist or worsen. Patient is aware when to  return to the clinic for a follow-up visit. Patient educated on when it is appropriate to go to the emergency department.   Monia Pouch, FNP-C [facility] [phone]

## 2021-07-25 ENCOUNTER — Other Ambulatory Visit: Payer: Self-pay | Admitting: Family Medicine

## 2021-07-25 DIAGNOSIS — M48062 Spinal stenosis, lumbar region with neurogenic claudication: Secondary | ICD-10-CM

## 2021-07-25 DIAGNOSIS — F419 Anxiety disorder, unspecified: Secondary | ICD-10-CM

## 2021-07-30 ENCOUNTER — Encounter: Payer: Medicare Other | Admitting: Family Medicine

## 2021-08-03 ENCOUNTER — Encounter: Payer: Self-pay | Admitting: Family Medicine

## 2021-08-03 ENCOUNTER — Ambulatory Visit (INDEPENDENT_AMBULATORY_CARE_PROVIDER_SITE_OTHER): Payer: Medicare Other | Admitting: Family Medicine

## 2021-08-03 VITALS — BP 136/66 | HR 74 | Temp 98.1°F | Ht 68.0 in | Wt 189.0 lb

## 2021-08-03 DIAGNOSIS — N1831 Chronic kidney disease, stage 3a: Secondary | ICD-10-CM | POA: Diagnosis not present

## 2021-08-03 DIAGNOSIS — M7522 Bicipital tendinitis, left shoulder: Secondary | ICD-10-CM | POA: Diagnosis not present

## 2021-08-03 DIAGNOSIS — Z01818 Encounter for other preprocedural examination: Secondary | ICD-10-CM

## 2021-08-03 DIAGNOSIS — L304 Erythema intertrigo: Secondary | ICD-10-CM

## 2021-08-03 DIAGNOSIS — E039 Hypothyroidism, unspecified: Secondary | ICD-10-CM

## 2021-08-03 NOTE — Patient Instructions (Signed)
Proximal Biceps Tendinitis and Tenosynovitis The proximal biceps tendon is a strong cord of tissue that connects the biceps muscle on the front of the upper arm to the shoulder blade. Tendinitis is inflammation of a tendon. Tenosynovitis is inflammation of the lining around the tendon (tendon sheath). These conditions often occur at the same time, and they can interfere with the ability to bend the elbow and turn the palm of the hand up. Proximal biceps tendinitis and tenosynovitis are usually caused by overusing the shoulder joint and the biceps muscle. These conditions usually heal within 6 weeks. Proximal biceps tendinitis may include a grade 1 or grade 2 strain of the tendon. A grade 1 strain is mild, and it involves a slight pull of the tendon without any stretching or noticeable tearing of the tendon. There is usually no loss of biceps muscle strength. A grade 2 strain is moderate, and it involves a small tear in the tendon. The tendon is stretched, and biceps strength is usually decreased. What are the causes? This condition may be caused by: A sudden increase in frequency or intensity of activity that involves the shoulder and the biceps muscle. Overuse of the biceps muscle. This can happen when you do the same movements over and over, such as: Turning the palm of the hand up. Forceful straightening (hyperextension) of the elbow. Bending the elbow. A direct, forceful hit or injury to the elbow. This is rare. What increases the risk? The following factors may make you more likely to develop this condition: Playing contact sports. Playing sports that involve throwing and overhead movements, including racket sports, gymnastics, weight lifting, or bodybuilding. Doing physical labor. Having poor strength and flexibility of the arm and shoulder. What are the signs or symptoms? Symptoms of this condition may include: Pain and inflammation in the front of the shoulder. A feeling of warmth in  the front of the shoulder. Limited range of motion of the shoulder and the elbow. A crackling sound (crepitation) when you move or touch the shoulder or the upper arm. In some cases, symptoms may return after treatment, and they may be long-lasting (chronic). How is this diagnosed? This condition is diagnosed based on: Your symptoms. Your medical history. Physical exam. X-ray or MRI, if needed. How is this treated? Treatment for this condition depends on the severity of your injury. It may include: Resting the injured arm. Icing the injured area. Doing physical therapy. Your health care provider may also use: Medicines to treat pain and inflammation. Sound waves to treat the injured muscle (ultrasound therapy). Medicines that are injected to the muscle (corticosteroids). Medicines that numb the area (local anesthetics). Surgery. This is done if other treatments have not worked. Follow these instructions at home: Managing pain, stiffness, and swelling   If directed, put ice on the injured area. Put ice in a plastic bag. Place a towel between your skin and the bag. Leave the ice on for 20 minutes, 2-3 times a day. If directed, apply heat to the affected area before you exercise. Use the heat source that your health care provider recommends, such as a moist heat pack or a heating pad. Place a towel between your skin and the heat source. Leave the heat on for 20-30 minutes. Remove the heat if your skin turns bright red. This is especially important if you are unable to feel pain, heat, or cold. You may have a greater risk of getting burned. Move your fingers often to reduce stiffness and swelling. Raise (elevate)  the injured area above the level of your heart while you are lying down. Activity Do not lift anything that is heavier than 10 lb (4.5 kg), or the limit that you are told, until your health care provider says that it is safe. Avoid activities that cause pain or make your  condition worse. Return to your normal activities as told by your health care provider. Ask your health care provider what activities are safe for you. Do exercises as told by your health care provider. General instructions Take over-the-counter and prescription medicines only as told by your health care provider. Do not use any products that contain nicotine or tobacco, such as cigarettes, e-cigarettes, and chewing tobacco. These can delay healing. If you need help quitting, ask your health care provider. Keep all follow-up visits as told by your health care provider. This is important. How is this prevented? Warm up and stretch before being active. Cool down and stretch after being active. Give your body time to rest between periods of activity. Make sure any equipment that you use is fitted to you. Be safe and responsible while being active to avoid falls. Maintain physical fitness, including: Strength. Flexibility. Heart health (cardiovascular fitness). The ability to use muscles for a long time (endurance). Contact a health care provider if: You have symptoms that get worse or do not get better after 2 weeks of treatment. You develop new symptoms. Get help right away if: You develop severe pain. Summary Tendinitis is inflammation of the biceps tendon. Tenosynovitis is inflammation of the lining around the biceps tendon. These conditions often occur at the same time. These conditions are usually caused by overusing the shoulder joint and biceps muscle. Symptoms include pain, warmth in the shoulder, and limited range of motion. The two conditions are treated with rest, ice, medicines, and surgery (rare). This information is not intended to replace advice given to you by your health care provider. Make sure you discuss any questions you have with your health care provider. Document Revised: 08/06/2020 Document Reviewed: 08/06/2020 Elsevier Patient Education  [place].

## 2021-08-03 NOTE — Progress Notes (Addendum)
Subjective: CC: Follow-up shoulder pain PCP: [name], DO Brenda Solis is a 87 y.o. female presenting to clinic today for:  1.  Left shoulder pain Patient was seen on the 27th of the month by [facility] for left shoulder pain.  She was thought to have impingement and was prescribed prednisone and physical therapy.  Patient has not yet started physical therapy but will be starting tomorrow.  She notes that pain is fairly stable with only minimal improvement after the prednisone.  She still has pain with certain movements and to tenderness to palpation to the shoulder.  No sensory changes reported.  She starting to have similar symptoms on the right side.  She is right-hand dominant and utilizes her cane on the right side.  2.  Intertrigo Reports rash under her breast.  Asking for cream to help.  Reports burning and itching   ROS: Per HPI  Allergies  Allergen Reactions   Gabapentin Other (See Comments)    Dizzy   Oxycodone Itching   Past Medical History:  Diagnosis Date   Anxiety    Depression    Heart murmur    Hyperlipidemia    Hypertension    Hypothyroidism    Insomnia, unspecified    Spondylosis of unspecified site without mention of myelopathy    Thyroid disease    hypothyroidism    Current Outpatient Medications:    buPROPion (WELLBUTRIN XL) 300 MG 24 hr tablet, Take 1 tablet (300 mg total) by mouth daily., Disp: 90 tablet, Rfl: 3   Cholecalciferol (VITAMIN D3 PO), Take 1 tablet by mouth daily., Disp: , Rfl:    DULoxetine (CYMBALTA) 30 MG capsule, Take 1 capsule (30 mg total) by mouth daily., Disp: 90 capsule, Rfl: 0   levothyroxine (SYNTHROID) 75 MCG tablet, TAKE 1 TABLET BY MOUTH EVERY DAY BEFORE BREAKFAST, Disp: 90 tablet, Rfl: 1   meclizine (ANTIVERT) 25 MG tablet, TAKE ONE TABLET BY MOUTH THREE TIMES DAILY AS NEEDED FOR DIZZINESS, Disp: 30 tablet, Rfl: 2   methocarbamol (ROBAXIN) 500 MG tablet, Take 500 mg by mouth 3 (three) times daily as needed for  muscle spasms., Disp: , Rfl:    Multiple Vitamin (MULTIVITAMIN WITH MINERALS) TABS tablet, Take 1 tablet by mouth daily., Disp: , Rfl:    traZODone (DESYREL) 50 MG tablet, TAKE 1-2 TABLETS BY MOUTH AT BEDTIME AS NEEDED FOR SLEEP., Disp: 180 tablet, Rfl: 3 Social History   Socioeconomic History   Marital status: Widowed    Spouse name: Not on file   Number of children: 3   Years of education: Not on file   Highest education level: 12th grade  Occupational History    Employer: RETIRED    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  Tobacco Use   Smoking status: Never   Smokeless tobacco: Never  Vaping Use   Vaping Use: Never used  Substance and Sexual Activity   Alcohol use: No    Alcohol/week: 0.0 standard drinks   Drug use: No   Sexual activity: Not Currently  Other Topics Concern   Not on file  Social History Narrative   Not on file   Social Determinants of Health   Financial Resource Strain: Not on file  Food Insecurity: Not on file  Transportation Needs: Not on file  Physical Activity: Not on file  Stress: Not on file  Social Connections: Not on file  Intimate Partner Violence: Not on file   Family History  Problem Relation Age of Onset  Coronary artery disease Brother        63s   Heart disease Brother    Cancer Mother        breast   Osteoporosis Mother    Heart disease Mother    Osteoporosis Maternal Aunt    Heart disease Father    Stroke Father    Esophageal varices Brother    Stroke Other        FAMILY   Esophageal varices Other        FAMILY    Objective: Office vital signs reviewed. BP 136/66    Pulse 74    Temp 98.1 F (36.7 C)    Ht 5' 8" (1.727 m)    Wt 189 lb (85.7 kg)    SpO2 98%    BMI 28.74 kg/m   Physical Examination:  General: Awake, alert, well nourished, No acute distress Breast: Intertriginous erythematous rash with mild maceration noted under the breast on the right MSK:  Left upper extremity: Minimal tenderness palpation along the  anterior left shoulder which gets worse as you traverse inferiorly down the biceps.  No tenderness palpation of the distal biceps.  She has pain with internal rotation of the left shoulder.  Her active range of motion seems to be fairly preserved in flexion and aBduction  Assessment/ Plan: 87 y.o. female   Biceps tendinitis of left upper extremity  Stage 3a chronic kidney disease ([place]) - Plan: CMP14+EGFR, CBC, Protime-INR  Acquired hypothyroidism - Plan: TSH, T4, free  Pre-op evaluation - Plan: Protime-INR  Intertrigo - Plan: nystatin cream (MYCOSTATIN)  I suspect that her left upper extremity pain is actually from a biceps tendinitis at the proximal head.  She had tenderness palpation along the anterior shoulder as well as the biceps belly.  No tenderness palpation at the distal insertion.  Pain with internal rotation of the shoulder was present as well however.  She has a referral in place for physical therapy.  Agree with proceeding with that.  If no significant improvement in the symptoms, recommend evaluation with her orthopedist.  She is scheduled for knee surgery at the end of the month and so I have preordered her preop labs again since its been greater than 6 weeks since last collection.  She will come in if these need to be redrawn  Additionally, we will delay her shingles vaccination while she initiates physical therapy as I think this would cause some undue myalgia which may inhibit her ability to heal her shoulder  Nystatin cream sent for intertrigo of the breast  No orders of the defined types were placed in this encounter.  No orders of the defined types were placed in this encounter.    [name], DO [place] [phone]

## 2021-08-04 ENCOUNTER — Encounter: Payer: Self-pay | Admitting: Physical Therapy

## 2021-08-04 ENCOUNTER — Ambulatory Visit: Payer: Medicare Other | Attending: Family Medicine | Admitting: Physical Therapy

## 2021-08-04 ENCOUNTER — Other Ambulatory Visit: Payer: Self-pay

## 2021-08-04 DIAGNOSIS — M7542 Impingement syndrome of left shoulder: Secondary | ICD-10-CM | POA: Insufficient documentation

## 2021-08-04 DIAGNOSIS — M79602 Pain in left arm: Secondary | ICD-10-CM | POA: Insufficient documentation

## 2021-08-04 DIAGNOSIS — M25512 Pain in left shoulder: Secondary | ICD-10-CM | POA: Insufficient documentation

## 2021-08-04 NOTE — Therapy (Signed)
[facility] [place], [place], [address] Phone: [phone]   Fax:  [phone]  Physical Therapy Evaluation  Patient Details  Name: Brenda Solis MRN: [id number] Date of Birth: 08-30-1934 Referring Provider (PT): Darla Lesches   Encounter Date: 08/04/2021   PT End of Session - 08/04/21 1608     Visit Number 1    Number of Visits 8    Date for PT Re-Evaluation 09/01/21    PT Start Time 0315    PT Stop Time 0404    PT Time Calculation (min) 49 min    Activity Tolerance Patient tolerated treatment well    Behavior During Therapy [facility] for tasks assessed/performed             Past Medical History:  Diagnosis Date   Anxiety    Depression    Heart murmur    Hyperlipidemia    Hypertension    Hypothyroidism    Insomnia, unspecified    Spondylosis of unspecified site without mention of myelopathy    Thyroid disease    hypothyroidism    Past Surgical History:  Procedure Laterality Date   APPENDECTOMY     CATARACT EXTRACTION, BILATERAL Bilateral 2006   [place] and [place] LEVEL 1 N/A 02/01/2013   Procedure: CENTRAL DECOMPRESSIVE LUMBAR LAMINECTOMY LEVEL 1 L4-L5;  Surgeon: Tobi Bastos, MD;  Location: [facility];  Service: Orthopedics;  Laterality: N/A;   DECOMPRESSIVE LUMBAR LAMINECTOMY LEVEL 1 N/A 11/20/2014   Procedure: REVISION L4-S1 DECOMPRESSION, L4-L5 IN SITU FUSION (LEVEL 1);  Surgeon: Melina Schools, MD;  Location: [facility];  Service: Orthopedics;  Laterality: N/A;   KNEE SURGERY Right 2009   meniscus repair   SPINAL CORD STIMULATOR INSERTION N/A 06/25/2020   Procedure: SPINAL CORD STIMULATOR INSERTION;  Surgeon: Melina Schools, MD;  Location: [facility];  Service: Orthopedics;  Laterality: N/A;  3 hrs   TOTAL HIP ARTHROPLASTY  2011   RIGHT    There were no vitals filed for this visit.    Subjective Assessment -  08/04/21 1613     Subjective COVID-19 screen performed prior to patient entering clinic.  The patient presents to the clinic today with c/o left shoulder pain that came on about 3 to 4 weeks ago for no apparent reason.  She c/o anterior shoulder pain and some over her left bicep.  Today, she rates her pain at 4/10 but can be much higher with movement, especially when trying to reach behind her back.  Rest and heat makes her shoulder feel better.    Pertinent History HTN, hypothyroidism, cervical and lumbar surgery, OP (per patient).    Patient Stated Goals Not have left shoulder pain.    Currently in Pain? Yes    Pain Score 4     Pain Location Shoulder    Pain Orientation Left    Pain Descriptors / Indicators Sore;Sharp    Pain Type Acute pain    Pain Radiating Towards Left biceps.    Aggravating Factors  See above.    Pain Relieving Factors See above.                [facility] PT Assessment - 08/04/21 0001       Assessment   Medical Diagnosis Impingement syndrome of left shoulder.    Referring Provider (PT) Darla Lesches    Onset Date/Surgical Date --  3 to 4 weeks ago.     Precautions   Precautions None      Restrictions   Weight Bearing Restrictions No      Balance Screen   Has the patient fallen in the past 6 months Yes    How many times? 1.    Has the patient had a decrease in activity level because of a fear of falling?  No    Is the patient reluctant to leave their home because of a fear of falling?  No      Home Environment   Living Environment Private residence      Prior Function   Level of Independence Independent      Posture/Postural Control   Posture/Postural Control Postural limitations    Postural Limitations Rounded Shoulders;Forward head      Deep Tendon Reflexes   DTR Assessment Site Biceps;Brachioradialis;Triceps    Biceps DTR 1+    Brachioradialis DTR 1+    Triceps DTR 1+      ROM / Strength   AROM / PROM / Strength AROM;Strength      AROM    Overall AROM Comments Antigravity left shoulder flexion to 150 degrees, full ER and behind back to lower lumbar region.      Strength   Overall Strength Comments Abduction graded at 4-/5 decreased likely due to pain.  ER is 4 to 4+/5, IR is 4+/5.      Palpation   Palpation comment Tender to palpation over left her left bicipital groove and about midway into her left biceps muscle.      Special Tests   Other special tests (-) Drop Arm test but painful.  (+) Impingement testing.      Ambulation/Gait   Gait Comments Patient ambulating with a cane.                        Objective measurements completed on examination: See above findings.                     PT Long Term Goals - 08/04/21 1636       PT LONG TERM GOAL #1   Title Independent with a HEP.    Time 4    Period Weeks    Status New      PT LONG TERM GOAL #2   Title Perform ADL's with left shoulder pain not > 2/10.    Time 5    Period Weeks    Status New      PT LONG TERM GOAL #3   Title Increase left shoulder strength to a solid 4+/5 to increase stability for performance of functional activities.    Time 4    Period Weeks    Status New                    Plan - 08/04/21 1630     Clinical Impression Statement The patient presents to OPPT with c/o left shoulder pain that came on for no apparent reason about 3 to 4 weeks ago.  Her range of motion is essentially normal but is painful when going behind her back.  She has some left shoulder weakness which appears to be in response to pain.  She demonstrates a positive impingement test of her left shoulder. She is tender to palpation over her left bicipital groove and into the biceps muscle.  Patient will benefit from skilled physical therapy intervention to address  pain and deficits.    Personal Factors and Comorbidities Comorbidity 1;Other    Comorbidities HTN, hypothyroidism, cervical and lumbar surgery, OP (per patient).     Examination-Activity Limitations Other;Dressing    Examination-Participation Restrictions Other    Stability/Clinical Decision Making Stable/Uncomplicated    Clinical Decision Making Low    Rehab Potential Excellent    PT Frequency 2x / week    PT Duration 4 weeks    PT Treatment/Interventions ADLs/Self Care Home Management;Cryotherapy;Electrical Stimulation;Ultrasound;Moist Heat;Iontophoresis 4mg /ml Dexamethasone;Therapeutic activities;Therapeutic exercise;Manual techniques;Patient/family education;Passive range of motion;Dry needling    PT Next Visit Plan Combo e'stim/US, STW/M to left Biceps, RW4 beginning with yellow theraband.    Consulted and Agree with Plan of Care Patient             Patient will benefit from skilled therapeutic intervention in order to improve the following deficits and impairments:  Pain, Decreased activity tolerance, Decreased strength  Visit Diagnosis: Acute pain of left shoulder - Plan: PT plan of care cert/re-cert     Problem List Patient Active Problem List   Diagnosis Date Noted   Cough 07/06/2021   Osteoarthritis of right knee 05/18/2021   Chronic pain 06/25/2020   Obsessional thoughts    Mood disorder ([place])    Osteopenia 04/24/2014   Hyperlipidemia with target LDL less than 100 02/01/2014   Depression 02/01/2014   Spinal stenosis, lumbar region, with neurogenic claudication 02/01/2013   Hypertension 11/17/2010   Hypothyroidism 10/14/2010   Insomnia 10/14/2010   Gastritis 10/14/2010    Darshawn Boateng, [place], PT 08/04/2021, 4:38 PM  [facility] [address] [place], [place], [address] Phone: [phone]   Fax:  [phone]  Name: Brenda Solis MRN: [id number] Date of Birth: 1934-12-03

## 2021-08-05 NOTE — Patient Instructions (Addendum)
DUE TO COVID-19 ONLY ONE VISITOR IS ALLOWED TO COME WITH YOU AND STAY IN THE WAITING ROOM ONLY DURING PRE OP AND PROCEDURE.   **NO VISITORS ARE ALLOWED IN THE SHORT STAY AREA OR RECOVERY ROOM!!**  You are not required to quarantine, however you are required to wear a well-fitted mask when you are out and around people not in your household.  Hand Hygiene often Do NOT share personal items Notify your provider if you are in close contact with someone who has COVID or you develop fever 100.4 or greater, new onset of sneezing, cough, sore throat, shortness of breath or body aches.        Your procedure is scheduled on: Monday, 08-24-21   Report to [facility] Main  Entrance     Report to admitting at 8:25 AM   Call this number if you have problems the morning of surgery [phone]   Do not eat food :After Midnight.   May have liquids until 8:15 AM day of surgery  CLEAR LIQUID DIET  Foods Allowed                                                                     Foods Excluded  Water, Black Coffee (no milk/no creamer) and tea, regular and decaf                              liquids that you cannot  Plain Jell-O in any flavor  (No red)                         see through such as: Fruit ices (not with fruit pulp)                                 milk, soups, orange juice  Iced Popsicles (No red)                                    All solid food                             Apple juices Sports drinks like Gatorade (No red) Lightly seasoned clear broth or consume(fat free) Sugar     Complete one Ensure drink the morning of surgery at  8:15 AM  the day of surgery.     The day of surgery:  Drink ONE (1) Pre-Surgery Clear Ensure  the morning of surgery. Drink in one sitting. Do not sip.  This drink was given to you during your hospital  pre-op appointment visit. Nothing else to drink after completing the Pre-Surgery Clear Ensure          If you have questions, please contact  your surgeons office.     Oral Hygiene is also important to reduce your risk of infection.  Remember - BRUSH YOUR TEETH THE MORNING OF SURGERY WITH YOUR REGULAR TOOTHPASTE   Do NOT smoke after Midnight   Take these medicines the morning of surgery with A SIP OF WATER: Bupropion, Duloxetine, Levothyroxine   Stop all vitamins and herbal supplements a week before surgery.     Stop Motrin, Aleve, Ibuprofen a week before surgery.             You may not have any metal on your body including hair pins, jewelry, and body piercing             Do not wear make-up, lotions, powders, perfumes or deodorant  Do not wear nail polish including gel and S&S, artificial/acrylic nails, or any other type of covering on natural nails including finger and toenails. If you have artificial nails, gel coating, etc. that needs to be removed by a nail salon please have this removed prior to surgery or surgery may need to be canceled/ delayed if the surgeon/ anesthesia feels like they are unable to be safely monitored.   Do not shave  48 hours prior to surgery.           Contacts, dentures or bridgework may not be worn into surgery.  Bring small overnight bag day of surgery.  Do not bring valuables to the hospital. [name].  Please read over the following fact sheets you were given: IF YOU HAVE QUESTIONS ABOUT YOUR PRE OP INSTRUCTIONS PLEASE CALL [facility] - Preparing for Surgery Before surgery, you can play an important role.  Because skin is not sterile, your skin needs to be as free of germs as possible.  You can reduce the number of germs on your skin by washing with CHG (chlorahexidine gluconate) soap before surgery.  CHG is an antiseptic cleaner which kills germs and bonds with the skin to continue killing germs even after washing. Please DO NOT use if you have an allergy to CHG or antibacterial soaps.  If your skin  becomes reddened/irritated stop using the CHG and inform your nurse when you arrive at Short Stay. Do not shave (including legs and underarms) for at least 48 hours prior to the first CHG shower.  You may shave your face/neck.  Please follow these instructions carefully:  1.  Shower with CHG Soap the night before surgery and the  morning of surgery.  2.  If you choose to wash your hair, wash your hair first as usual with your normal  shampoo.  3.  After you shampoo, rinse your hair and body thoroughly to remove the shampoo.                             4.  Use CHG as you would any other liquid soap.  You can apply chg directly to the skin and wash.  Gently with a scrungie or clean washcloth.  5.  Apply the CHG Soap to your body ONLY FROM THE NECK DOWN.   Do   not use on face/ open                           Wound or open sores. Avoid contact with eyes, ears mouth and   genitals (private parts).                       Wash face,  Genitals (private parts) with your normal soap.             6.  Wash thoroughly, paying special attention to the area where your    surgery  will be performed.  7.  Thoroughly rinse your body with warm water from the neck down.  8.  DO NOT shower/wash with your normal soap after using and rinsing off the CHG Soap.                9.  Pat yourself dry with a clean towel.            10.  Wear clean pajamas.            11.  Place clean sheets on your bed the night of your first shower and do not  sleep with pets. Day of Surgery : Do not apply any lotions/deodorants the morning of surgery.  Please wear clean clothes to the hospital/surgery center.  FAILURE TO FOLLOW THESE INSTRUCTIONS MAY RESULT IN THE CANCELLATION OF YOUR SURGERY  PATIENT SIGNATURE_________________________________  NURSE SIGNATURE__________________________________  ________________________________________________________________________   Brenda Solis  An incentive spirometer is a tool that can  help keep your lungs clear and active. This tool measures how well you are filling your lungs with each breath. Taking long deep breaths may help reverse or decrease the chance of developing breathing (pulmonary) problems (especially infection) following: A long period of time when you are unable to move or be active. BEFORE THE PROCEDURE  If the spirometer includes an indicator to show your best effort, your nurse or respiratory therapist will set it to a desired goal. If possible, sit up straight or lean slightly forward. Try not to slouch. Hold the incentive spirometer in an upright position. INSTRUCTIONS FOR USE  Sit on the edge of your bed if possible, or sit up as far as you can in bed or on a chair. Hold the incentive spirometer in an upright position. Breathe out normally. Place the mouthpiece in your mouth and seal your lips tightly around it. Breathe in slowly and as deeply as possible, raising the piston or the ball toward the top of the column. Hold your breath for 3-5 seconds or for as long as possible. Allow the piston or ball to fall to the bottom of the column. Remove the mouthpiece from your mouth and breathe out normally. Rest for a few seconds and repeat Steps 1 through 7 at least 10 times every 1-2 hours when you are awake. Take your time and take a few normal breaths between deep breaths. The spirometer may include an indicator to show your best effort. Use the indicator as a goal to work toward during each repetition. After each set of 10 deep breaths, practice coughing to be sure your lungs are clear. If you have an incision (the cut made at the time of surgery), support your incision when coughing by placing a pillow or rolled up towels firmly against it. Once you are able to get out of bed, walk around indoors and cough well. You may stop using the incentive spirometer when instructed by your caregiver.  RISKS AND COMPLICATIONS Take your time so you do not get dizzy or  light-headed. If you are in pain, you may need to take or ask for pain medication before doing incentive spirometry. It is harder to take a deep breath if you are having pain. AFTER USE Rest and breathe slowly and easily. It can be helpful to keep track  of a log of your progress. Your caregiver can provide you with a simple table to help with this. If you are using the spirometer at home, follow these instructions: [place] IF:  You are having difficultly using the spirometer. You have trouble using the spirometer as often as instructed. Your pain medication is not giving enough relief while using the spirometer. You develop fever of 100.5 F (38.1 C) or higher. SEEK IMMEDIATE MEDICAL CARE IF:  You cough up bloody sputum that had not been present before. You develop fever of 102 F (38.9 C) or greater. You develop worsening pain at or near the incision site. MAKE SURE YOU:  Understand these instructions. Will watch your condition. Will get help right away if you are not doing well or get worse. Document Released: 10/25/2006 Document Revised: 09/06/2011 Document Reviewed: 12/26/2006 [facility] Patient Information 2014 [place], [place].   ________________________________________________________________________

## 2021-08-05 NOTE — Progress Notes (Addendum)
COVID swab appointment: Same Day Covid test due to transportation  COVID Vaccine Completed:  Yes x2 Date COVID Vaccine completed: Has received booster:  Yes x1 COVID vaccine manufacturer: Moderna     Date of COVID positive in last 90 days:  No  PCP - Ronnie Doss, DO Cardiologist - Kirk Ruths, MD (last visit 2012 for syncopal episode)  Chest x-ray - 07-09-21 Epic EKG - 07-24-21 Epic Stress Test - greater than 2 years Epic ECHO - greater than 2 years Epic Cardiac Cath -  Pacemaker/ICD device last checked: Spinal Cord Stimulator:  Yes L lower back  Bowel Prep - N/A  Sleep Study - N/A CPAP -   Fasting Blood Sugar - N/A Checks Blood Sugar _____ times a day  Blood Thinner Instructions:N/A Aspirin Instructions: Last Dose:  Activity level:   Can go up a flight of stairs and perform activities of daily living without stopping and without symptoms of chest pain or shortness of breath.  Pt lives alone    Anesthesia review:  HTN, murmur.  Surgery rescheduled due to pneumonia and flu, no residual symptoms  Patient denies shortness of breath, fever, cough and chest pain at PAT appointment   Patient verbalized understanding of instructions that were given to them at the PAT appointment. Patient was also instructed that they will need to review over the PAT instructions again at home before surgery.

## 2021-08-07 ENCOUNTER — Other Ambulatory Visit: Payer: Self-pay

## 2021-08-07 ENCOUNTER — Ambulatory Visit: Payer: Medicare Other

## 2021-08-07 DIAGNOSIS — M79602 Pain in left arm: Secondary | ICD-10-CM | POA: Diagnosis not present

## 2021-08-07 DIAGNOSIS — M25512 Pain in left shoulder: Secondary | ICD-10-CM | POA: Diagnosis not present

## 2021-08-07 DIAGNOSIS — M7542 Impingement syndrome of left shoulder: Secondary | ICD-10-CM | POA: Diagnosis not present

## 2021-08-07 NOTE — Therapy (Signed)
[facility] [place], [place], [address] Phone: [phone]   Fax:  [phone]  Physical Therapy Treatment  Patient Details  Name: Brenda Solis MRN: [id number] Date of Birth: April 01, 1935 Referring Provider (PT): Darla Lesches   Encounter Date: 08/07/2021   PT End of Session - 08/07/21 1035     Visit Number 2    Number of Visits 8    Date for PT Re-Evaluation 09/01/21    PT Start Time 1030    PT Stop Time 1119    PT Time Calculation (min) 49 min    Activity Tolerance Patient tolerated treatment well    Behavior During Therapy [facility] for tasks assessed/performed             Past Medical History:  Diagnosis Date   Anxiety    Depression    Heart murmur    Hyperlipidemia    Hypertension    Hypothyroidism    Insomnia, unspecified    Spondylosis of unspecified site without mention of myelopathy    Thyroid disease    hypothyroidism    Past Surgical History:  Procedure Laterality Date   APPENDECTOMY     CATARACT EXTRACTION, BILATERAL Bilateral 2006   [place] and [place] LEVEL 1 N/A 02/01/2013   Procedure: CENTRAL DECOMPRESSIVE LUMBAR LAMINECTOMY LEVEL 1 L4-L5;  Surgeon: Tobi Bastos, MD;  Location: [facility];  Service: Orthopedics;  Laterality: N/A;   DECOMPRESSIVE LUMBAR LAMINECTOMY LEVEL 1 N/A 11/20/2014   Procedure: REVISION L4-S1 DECOMPRESSION, L4-L5 IN SITU FUSION (LEVEL 1);  Surgeon: Melina Schools, MD;  Location: [place];  Service: Orthopedics;  Laterality: N/A;   KNEE SURGERY Right 2009   meniscus repair   SPINAL CORD STIMULATOR INSERTION N/A 06/25/2020   Procedure: SPINAL CORD STIMULATOR INSERTION;  Surgeon: Melina Schools, MD;  Location: [place];  Service: Orthopedics;  Laterality: N/A;  3 hrs   TOTAL HIP ARTHROPLASTY  2011   RIGHT    There were no vitals filed for this visit.   Subjective Assessment -  08/07/21 1034     Subjective COVID-19 screen performed prior to patient entering clinic.  Pt arrives for today's treatment session reporting 3/10 left shoulder pain.  Pt states that her pain increased to a 7/10 yesterday when lifting groceries.    Pertinent History HTN, hypothyroidism, cervical and lumbar surgery, OP (per patient).    Patient Stated Goals Not have left shoulder pain.    Currently in Pain? Yes    Pain Score 3     Pain Location Shoulder    Pain Orientation Left                               [facility] Adult PT Treatment/Exercise - 08/07/21 0001       Exercises   Exercises Shoulder      Shoulder Exercises: Seated   Extension Strengthening;Both;15 reps    Theraband Level (Shoulder Extension) Level 1 (Yellow)    Row Strengthening;Both;15 reps    Theraband Level (Shoulder Row) Level 1 (Yellow)    Horizontal ABduction Strengthening;Both;15 reps    Theraband Level (Shoulder Horizontal ABduction) Level 1 (Yellow)    External Rotation Strengthening;Both;15 reps    Theraband Level (Shoulder External Rotation) Level 1 (Yellow)    Internal Rotation Strengthening;Left;15 reps;Theraband    Theraband Level (Shoulder Internal Rotation)  Level 1 (Yellow)    Flexion Strengthening;Left;15 reps    Theraband Level (Shoulder Flexion) Level 1 (Yellow)    Diagonals Strengthening;Left;15 reps    Theraband Level (Shoulder Diagonals) Level 1 (Yellow)      Shoulder Exercises: Pulleys   Flexion 5 minutes      Modalities   Modalities Electrical Stimulation;Vasopneumatic      Electrical Stimulation   Electrical Stimulation Location left shoulder    Electrical Stimulation Action IFC 80-150 Hz    Electrical Stimulation Parameters 40% scan x 15 mins    Electrical Stimulation Goals Pain;Tone      Vasopneumatic   Number Minutes Vasopneumatic  15 minutes    Vasopnuematic Location  Shoulder    Vasopneumatic Pressure Low    Vasopneumatic Temperature  34      Manual Therapy    Manual Therapy Soft tissue mobilization    Soft tissue mobilization STW/M to left bicep to decrease pain and tone                          PT Long Term Goals - 08/04/21 1636       PT LONG TERM GOAL #1   Title Independent with a HEP.    Time 4    Period Weeks    Status New      PT LONG TERM GOAL #2   Title Perform ADL's with left shoulder pain not > 2/10.    Time 5    Period Weeks    Status New      PT LONG TERM GOAL #3   Title Increase left shoulder strength to a solid 4+/5 to increase stability for performance of functional activities.    Time 4    Period Weeks    Status New                   Plan - 08/07/21 1035     Clinical Impression Statement Pt arrives for today's treatment session reporting 3/10 left shoulder pain.  Pt states that her pain increased to 7/10 yesterday when lifting groceries. Pt introduced to pulleys and seated tband exercises.  Pt requiring cues for proper technique and posture.  Normal responses to estim and vaso noted.  Pt reported 2/10 left shoulder pain at completion of today's treatment session.    Personal Factors and Comorbidities Comorbidity 1;Other    Comorbidities HTN, hypothyroidism, cervical and lumbar surgery, OP (per patient).    Examination-Activity Limitations Other;Dressing    Examination-Participation Restrictions Other    Stability/Clinical Decision Making Stable/Uncomplicated    Rehab Potential Excellent    PT Frequency 2x / week    PT Duration 4 weeks    PT Treatment/Interventions ADLs/Self Care Home Management;Cryotherapy;Electrical Stimulation;Ultrasound;Moist Heat;Iontophoresis 4mg /ml Dexamethasone;Therapeutic activities;Therapeutic exercise;Manual techniques;Patient/family education;Passive range of motion;Dry needling    PT Next Visit Plan Combo e'stim/US, STW/M to left Biceps, RW4 beginning with yellow theraband.    Consulted and Agree with Plan of Care Patient             Patient will  benefit from skilled therapeutic intervention in order to improve the following deficits and impairments:  Pain, Decreased activity tolerance, Decreased strength  Visit Diagnosis: Acute pain of left shoulder     Problem List Patient Active Problem List   Diagnosis Date Noted   Cough 07/06/2021   Osteoarthritis of right knee 05/18/2021   Chronic pain 06/25/2020   Obsessional thoughts    Mood disorder ([place])  Osteopenia 04/24/2014   Hyperlipidemia with target LDL less than 100 02/01/2014   Depression 02/01/2014   Spinal stenosis, lumbar region, with neurogenic claudication 02/01/2013   Hypertension 11/17/2010   Hypothyroidism 10/14/2010   Insomnia 10/14/2010   Gastritis 10/14/2010    Kathrynn Ducking, PTA 08/07/2021, 11:22 AM  [facility] [place], [place], [address] Phone: [phone]   Fax:  [phone]  Name: Brenda Solis MRN: [id number] Date of Birth: 17-Jan-1935

## 2021-08-10 ENCOUNTER — Ambulatory Visit: Payer: Medicare Other

## 2021-08-10 ENCOUNTER — Other Ambulatory Visit: Payer: Self-pay

## 2021-08-10 DIAGNOSIS — M7542 Impingement syndrome of left shoulder: Secondary | ICD-10-CM | POA: Diagnosis not present

## 2021-08-10 DIAGNOSIS — M25512 Pain in left shoulder: Secondary | ICD-10-CM | POA: Diagnosis not present

## 2021-08-10 DIAGNOSIS — M79602 Pain in left arm: Secondary | ICD-10-CM | POA: Diagnosis not present

## 2021-08-10 NOTE — Therapy (Signed)
[facility] [place], [place], [address] Phone: [phone]   Fax:  [phone]  Physical Therapy Treatment  Patient Details  Name: Brenda Solis MRN: [id number] Date of Birth: 1935/05/01 Referring Provider (PT): Darla Lesches   Encounter Date: 08/10/2021   PT End of Session - 08/10/21 1446     Visit Number 3    Number of Visits 8    Date for PT Re-Evaluation 09/01/21    PT Start Time 0232    PT Stop Time 0315    PT Time Calculation (min) 43 min    Activity Tolerance Patient tolerated treatment well    Behavior During Therapy [facility] for tasks assessed/performed             Past Medical History:  Diagnosis Date   Anxiety    Depression    Heart murmur    Hyperlipidemia    Hypertension    Hypothyroidism    Insomnia, unspecified    Spondylosis of unspecified site without mention of myelopathy    Thyroid disease    hypothyroidism    Past Surgical History:  Procedure Laterality Date   APPENDECTOMY     CATARACT EXTRACTION, BILATERAL Bilateral 2006   [place] and [place] LEVEL 1 N/A 02/01/2013   Procedure: CENTRAL DECOMPRESSIVE LUMBAR LAMINECTOMY LEVEL 1 L4-L5;  Surgeon: Tobi Bastos, MD;  Location: [facility];  Service: Orthopedics;  Laterality: N/A;   DECOMPRESSIVE LUMBAR LAMINECTOMY LEVEL 1 N/A 11/20/2014   Procedure: REVISION L4-S1 DECOMPRESSION, L4-L5 IN SITU FUSION (LEVEL 1);  Surgeon: Melina Schools, MD;  Location: [place];  Service: Orthopedics;  Laterality: N/A;   KNEE SURGERY Right 2009   meniscus repair   SPINAL CORD STIMULATOR INSERTION N/A 06/25/2020   Procedure: SPINAL CORD STIMULATOR INSERTION;  Surgeon: Melina Schools, MD;  Location: [place];  Service: Orthopedics;  Laterality: N/A;  3 hrs   TOTAL HIP ARTHROPLASTY  2011   RIGHT    There were no vitals filed for this visit.   Subjective Assessment -  08/10/21 1433     Subjective COVID-19 screen performed prior to patient entering clinic.  Patient reports that her arm feels alright today because she has not used it a lot.    Pertinent History HTN, hypothyroidism, cervical and lumbar surgery, OP (per patient).    Patient Stated Goals Not have left shoulder pain.    Currently in Pain? Yes    Pain Score 2     Pain Location Shoulder    Pain Orientation Left                               [facility] Adult PT Treatment/Exercise - 08/10/21 0001       Shoulder Exercises: Seated   Row 5 reps;Both;Theraband   2 minutes   Theraband Level (Shoulder Row) Level 2 (Red)    External Rotation Both;20 reps;Theraband    Theraband Level (Shoulder External Rotation) Level 2 (Red)    Abduction Both;Theraband;20 reps    Theraband Level (Shoulder ABduction) Level 1 (Yellow)    Other Seated Exercises Therabar bending   red t-bar; up and down; 2 minutes each     Shoulder Exercises: ROM/Strengthening   Wall Pushups 20 reps      Shoulder Exercises: Stretch   Internal Rotation Stretch 4 reps   30  second hold     Modalities   Modalities Teacher, English as a foreign language Location left shoulder    Electrical Stimulation Action IFC 80-150 Hz    Electrical Stimulation Parameters 40% scan x 15 minutes    Electrical Stimulation Goals Pain                          PT Long Term Goals - 08/04/21 1636       PT LONG TERM GOAL #1   Title Independent with a HEP.    Time 4    Period Weeks    Status New      PT LONG TERM GOAL #2   Title Perform ADL's with left shoulder pain not > 2/10.    Time 5    Period Weeks    Status New      PT LONG TERM GOAL #3   Title Increase left shoulder strength to a solid 4+/5 to increase stability for performance of functional activities.    Time 4    Period Weeks    Status New                   Plan - 08/10/21 1501     Clinical  Impression Statement Patient was introduced to multiple new interventions for improved shoulder strength and stability needed for functional activities. She required minimal cuing with today's new interventions for proper exercise performance. Fatigue was her primary limitation with these interventions. She experienced a mild increase in left bicep sorenss, but this was able to be reduced with electrical stimulation at the conclusion of treatment. She reported feeling good upon the conclusion of treatment. She continues to require skilled physical therapy to address her remaining impairments to return to her prior level of function.    Personal Factors and Comorbidities Comorbidity 1;Other    Comorbidities HTN, hypothyroidism, cervical and lumbar surgery, OP (per patient).    Examination-Activity Limitations Other;Dressing    Examination-Participation Restrictions Other    Stability/Clinical Decision Making Stable/Uncomplicated    Rehab Potential Excellent    PT Frequency 2x / week    PT Duration 4 weeks    PT Treatment/Interventions ADLs/Self Care Home Management;Cryotherapy;Electrical Stimulation;Ultrasound;Moist Heat;Iontophoresis 4mg /ml Dexamethasone;Therapeutic activities;Therapeutic exercise;Manual techniques;Patient/family education;Passive range of motion;Dry needling    PT Next Visit Plan Combo e'stim/US, STW/M to left Biceps, RW4 beginning with yellow theraband.    Consulted and Agree with Plan of Care Patient             Patient will benefit from skilled therapeutic intervention in order to improve the following deficits and impairments:  Pain, Decreased activity tolerance, Decreased strength  Visit Diagnosis: Acute pain of left shoulder     Problem List Patient Active Problem List   Diagnosis Date Noted   Cough 07/06/2021   Osteoarthritis of right knee 05/18/2021   Chronic pain 06/25/2020   Obsessional thoughts    Mood disorder ([place])    Osteopenia 04/24/2014    Hyperlipidemia with target LDL less than 100 02/01/2014   Depression 02/01/2014   Spinal stenosis, lumbar region, with neurogenic claudication 02/01/2013   Hypertension 11/17/2010   Hypothyroidism 10/14/2010   Insomnia 10/14/2010   Gastritis 10/14/2010    [name], PT 08/10/2021, 3:29 PM  [facility] [address] [place], [place], [address] Phone: [phone]   Fax:  [phone]  Name: Brenda Solis MRN: [id number] Date of  Birth: 1935-04-01

## 2021-08-11 ENCOUNTER — Encounter (HOSPITAL_COMMUNITY)
Admission: RE | Admit: 2021-08-11 | Discharge: 2021-08-11 | Disposition: A | Discharge status: Home / Self Care | Payer: Medicare Other | Source: Ambulatory Visit | Attending: Orthopedic Surgery | Admitting: Orthopedic Surgery

## 2021-08-11 ENCOUNTER — Encounter (HOSPITAL_COMMUNITY): Payer: Self-pay

## 2021-08-11 ENCOUNTER — Telehealth: Payer: Self-pay | Admitting: Family Medicine

## 2021-08-11 VITALS — BP 159/63 | HR 72 | Temp 98.3°F | Resp 18 | Ht 68.0 in | Wt 184.8 lb

## 2021-08-11 DIAGNOSIS — I2510 Atherosclerotic heart disease of native coronary artery without angina pectoris: Secondary | ICD-10-CM | POA: Insufficient documentation

## 2021-08-11 DIAGNOSIS — Z01818 Encounter for other preprocedural examination: Secondary | ICD-10-CM

## 2021-08-11 DIAGNOSIS — Z01812 Encounter for preprocedural laboratory examination: Secondary | ICD-10-CM | POA: Insufficient documentation

## 2021-08-11 HISTORY — DX: Pneumonia, unspecified organism: J18.9

## 2021-08-11 HISTORY — DX: Anemia, unspecified: D64.9

## 2021-08-11 LAB — CBC
HCT: 38.9 % (ref 36.0–46.0)
Hemoglobin: 12.5 g/dL (ref 12.0–15.0)
MCH: 29.6 pg (ref 26.0–34.0)
MCHC: 32.1 g/dL (ref 30.0–36.0)
MCV: 92.2 fL (ref 80.0–100.0)
Platelets: 219 10*3/uL (ref 150–400)
RBC: 4.22 MIL/uL (ref 3.87–5.11)
RDW: 14.6 % (ref 11.5–15.5)
WBC: 8.5 10*3/uL (ref 4.0–10.5)
nRBC: 0 % (ref 0.0–0.2)

## 2021-08-11 LAB — BASIC METABOLIC PANEL
Anion gap: 6 (ref 5–15)
BUN: 20 mg/dL (ref 8–23)
CO2: 25 mmol/L (ref 22–32)
Calcium: 9.8 mg/dL (ref 8.9–10.3)
Chloride: 104 mmol/L (ref 98–111)
Creatinine, Ser: 1.01 mg/dL — ABNORMAL HIGH (ref 0.44–1.00)
GFR, Estimated: 55 mL/min — ABNORMAL LOW (ref >60)
Glucose, Bld: 103 mg/dL — ABNORMAL HIGH (ref 70–99)
Potassium: 4.7 mmol/L (ref 3.5–5.1)
Sodium: 135 mmol/L (ref 135–145)

## 2021-08-11 LAB — SURGICAL PCR SCREEN
MRSA, PCR: NEGATIVE
Staphylococcus aureus: NEGATIVE

## 2021-08-11 MED ORDER — NYSTATIN 100000 UNIT/GM EX CREA: 1.0000 "application " | TOPICAL_CREAM | Freq: Two times a day (BID) | CUTANEOUS | 2 refills | Status: DC | PRN

## 2021-08-11 NOTE — Telephone Encounter (Signed)
Daughter in law aware  

## 2021-08-11 NOTE — Telephone Encounter (Signed)
Please give my apologies.  That got lost in the mix of the other issues she was reporting that day.  Cream has been sent.

## 2021-08-11 NOTE — Addendum Note (Signed)
Addended by: [name] on: 08/11/2021 03:19 PM   Modules accepted: Orders, Level of Service

## 2021-08-13 ENCOUNTER — Telehealth: Payer: Self-pay | Admitting: Family Medicine

## 2021-08-14 NOTE — Telephone Encounter (Signed)
As long as it is approved by her surgeon, I am fine with her doing that.  She'll need to be on someone's schedule.  She also needs to call her surgeon for the ok before we give it.  Steroids sometimes can interfere with healing from surgery so I know they don't typically prefer to administer them close to surgery.

## 2021-08-14 NOTE — Telephone Encounter (Signed)
Patient aware and verbalizes understanding. 

## 2021-08-14 NOTE — Telephone Encounter (Signed)
Lmtcb.

## 2021-08-20 ENCOUNTER — Encounter: Payer: Medicare Other | Admitting: Physical Therapy

## 2021-08-20 NOTE — Anesthesia Preprocedure Evaluation (Addendum)
Anesthesia Evaluation  Patient identified by MRN, date of birth, ID band Patient awake    Reviewed: Allergy & Precautions, NPO status , Patient's Chart, lab work & pertinent test results  Airway Mallampati: III  TM Distance: >3 FB Neck ROM: Full    Dental no notable dental hx. (+) Teeth Intact, Dental Advisory Given   Pulmonary neg pulmonary ROS,    Pulmonary exam normal breath sounds clear to auscultation       Cardiovascular hypertension (147/53 in preop, no meds), Normal cardiovascular exam Rhythm:Regular Rate:Normal     Neuro/Psych PSYCHIATRIC DISORDERS Anxiety Depression    GI/Hepatic negative GI ROS, Neg liver ROS,   Endo/Other  Hypothyroidism   Renal/GU negative Renal ROS  negative genitourinary   Musculoskeletal  (+) Arthritis , Osteoarthritis,  Back surgery x2 2014 and 2016, spinal cord stim 2021   Abdominal   Peds  Hematology hct 38.9, plt 219   Anesthesia Other Findings   Reproductive/Obstetrics negative OB ROS                            Anesthesia Physical Anesthesia Plan  ASA: 2  Anesthesia Plan: General and Regional   Post-op Pain Management: Tylenol PO (pre-op)* and Regional block*   Induction: Intravenous  PONV Risk Score and Plan: 3 and Ondansetron, Dexamethasone and Treatment may vary due to age or medical condition  Airway Management Planned: LMA  Additional Equipment: None  Intra-op Plan:   Post-operative Plan: Extubation in OR  Informed Consent: I have reviewed the patients History and Physical, chart, labs and discussed the procedure including the risks, benefits and alternatives for the proposed anesthesia with the patient or authorized representative who has indicated his/her understanding and acceptance.     Dental advisory given  Plan Discussed with: CRNA  Anesthesia Plan Comments:        Anesthesia Quick Evaluation

## 2021-08-21 NOTE — Progress Notes (Signed)
Pt aware to arrive at [facility] admitting at 0545 to 0600 on Monday 08/24/2021 for scheduled surgical procedure. No food after midnight; clear liquids from midnight till 0500 consuming entire pre surgery drink by 0500 then nothing by mouth.

## 2021-08-24 ENCOUNTER — Ambulatory Visit (HOSPITAL_BASED_OUTPATIENT_CLINIC_OR_DEPARTMENT_OTHER): Payer: Medicare Other | Admitting: Anesthesiology

## 2021-08-24 ENCOUNTER — Ambulatory Visit (HOSPITAL_COMMUNITY): Payer: Medicare Other | Admitting: Physician Assistant

## 2021-08-24 ENCOUNTER — Encounter (HOSPITAL_COMMUNITY): Payer: Self-pay | Admitting: Orthopedic Surgery

## 2021-08-24 ENCOUNTER — Observation Stay (HOSPITAL_COMMUNITY)
Admission: RE | Admit: 2021-08-24 | Discharge: 2021-08-25 | Disposition: A | Discharge status: Home / Self Care | Payer: Medicare Other | Source: Ambulatory Visit | Attending: Orthopedic Surgery | Admitting: Orthopedic Surgery

## 2021-08-24 ENCOUNTER — Encounter (HOSPITAL_COMMUNITY)
Admission: RE | Disposition: A | Discharge status: Home / Self Care | Payer: Self-pay | Source: Ambulatory Visit | Attending: Orthopedic Surgery

## 2021-08-24 ENCOUNTER — Other Ambulatory Visit: Payer: Self-pay

## 2021-08-24 DIAGNOSIS — G8918 Other acute postprocedural pain: Secondary | ICD-10-CM | POA: Diagnosis not present

## 2021-08-24 DIAGNOSIS — E039 Hypothyroidism, unspecified: Secondary | ICD-10-CM | POA: Diagnosis not present

## 2021-08-24 DIAGNOSIS — Z79899 Other long term (current) drug therapy: Secondary | ICD-10-CM | POA: Diagnosis not present

## 2021-08-24 DIAGNOSIS — M1711 Unilateral primary osteoarthritis, right knee: Secondary | ICD-10-CM

## 2021-08-24 DIAGNOSIS — Z01818 Encounter for other preprocedural examination: Secondary | ICD-10-CM

## 2021-08-24 DIAGNOSIS — Z20822 Contact with and (suspected) exposure to covid-19: Secondary | ICD-10-CM | POA: Diagnosis not present

## 2021-08-24 DIAGNOSIS — Z96641 Presence of right artificial hip joint: Secondary | ICD-10-CM | POA: Insufficient documentation

## 2021-08-24 DIAGNOSIS — I10 Essential (primary) hypertension: Secondary | ICD-10-CM | POA: Insufficient documentation

## 2021-08-24 HISTORY — PX: TOTAL KNEE ARTHROPLASTY: SHX125

## 2021-08-24 LAB — SARS CORONAVIRUS 2 BY RT PCR (HOSPITAL ORDER, PERFORMED IN ~~LOC~~ HOSPITAL LAB): SARS Coronavirus 2: NEGATIVE

## 2021-08-24 SURGERY — ARTHROPLASTY, KNEE, TOTAL
Anesthesia: Regional | Site: Knee | Laterality: Right

## 2021-08-24 MED ORDER — ONDANSETRON HCL 4 MG/2ML IJ SOLN: 4.0000 mg | Freq: Four times a day (QID) | INTRAMUSCULAR | Status: DC | PRN

## 2021-08-24 MED ORDER — LIDOCAINE HCL (PF) 2 % IJ SOLN
INTRAMUSCULAR | Status: AC
  Filled 2021-08-24: qty 5

## 2021-08-24 MED ORDER — ONDANSETRON HCL 4 MG/2ML IJ SOLN: 4.0000 mg | Freq: Once | INTRAMUSCULAR | Status: DC | PRN

## 2021-08-24 MED ORDER — BUPIVACAINE LIPOSOME 1.3 % IJ SUSP
20.0000 mL | Freq: Once | INTRAMUSCULAR | Status: DC
Start: 2021-08-24 — End: 2021-08-24

## 2021-08-24 MED ORDER — DOCUSATE SODIUM 100 MG PO CAPS
100.0000 mg | ORAL_CAPSULE | Freq: Two times a day (BID) | ORAL | Status: DC
  Administered 2021-08-24 – 2021-08-25 (×2): 100 mg via ORAL
  Filled 2021-08-24 (×2): qty 1

## 2021-08-24 MED ORDER — DEXAMETHASONE SODIUM PHOSPHATE 10 MG/ML IJ SOLN
INTRAMUSCULAR | Status: AC
  Filled 2021-08-24: qty 1

## 2021-08-24 MED ORDER — BISACODYL 10 MG RE SUPP: 10.0000 mg | Freq: Every day | RECTAL | Status: DC | PRN

## 2021-08-24 MED ORDER — HYDROMORPHONE HCL 1 MG/ML IJ SOLN
0.2500 mg | INTRAMUSCULAR | Status: DC | PRN
  Administered 2021-08-24 (×4): 0.5 mg via INTRAVENOUS

## 2021-08-24 MED ORDER — LIDOCAINE 2% (20 MG/ML) 5 ML SYRINGE
INTRAMUSCULAR | Status: DC | PRN
  Administered 2021-08-24: 60 mg via INTRAVENOUS

## 2021-08-24 MED ORDER — PHENOL 1.4 % MT LIQD: 1.0000 | OROMUCOSAL | Status: DC | PRN

## 2021-08-24 MED ORDER — ACETAMINOPHEN 10 MG/ML IV SOLN
1000.0000 mg | Freq: Four times a day (QID) | INTRAVENOUS | Status: DC
Start: 2021-08-24 — End: 2021-08-24
  Administered 2021-08-24: 1000 mg via INTRAVENOUS
  Filled 2021-08-24: qty 100

## 2021-08-24 MED ORDER — DULOXETINE HCL 30 MG PO CPEP
30.0000 mg | ORAL_CAPSULE | Freq: Every day | ORAL | Status: DC
Start: 2021-08-25 — End: 2021-08-25
  Administered 2021-08-25: 30 mg via ORAL
  Filled 2021-08-24: qty 1

## 2021-08-24 MED ORDER — LACTATED RINGERS IV SOLN: INTRAVENOUS | Status: DC

## 2021-08-24 MED ORDER — SODIUM CHLORIDE (PF) 0.9 % IJ SOLN
INTRAMUSCULAR | Status: DC | PRN
  Administered 2021-08-24: 60 mL

## 2021-08-24 MED ORDER — ORAL CARE MOUTH RINSE: 15.0000 mL | Freq: Once | OROMUCOSAL | Status: AC

## 2021-08-24 MED ORDER — ACETAMINOPHEN 500 MG PO TABS: 1000.0000 mg | ORAL_TABLET | Freq: Once | ORAL | Status: DC

## 2021-08-24 MED ORDER — HYDROCODONE-ACETAMINOPHEN 7.5-325 MG PO TABS: 1.0000 | ORAL_TABLET | Freq: Once | ORAL | Status: DC | PRN

## 2021-08-24 MED ORDER — FENTANYL CITRATE PF 50 MCG/ML IJ SOSY: 50.0000 ug | PREFILLED_SYRINGE | INTRAMUSCULAR | Status: DC

## 2021-08-24 MED ORDER — FENTANYL CITRATE (PF) 250 MCG/5ML IJ SOLN
INTRAMUSCULAR | Status: DC | PRN
Start: 2021-08-24 — End: 2021-08-24
  Administered 2021-08-24 (×5): 50 ug via INTRAVENOUS

## 2021-08-24 MED ORDER — FENTANYL CITRATE PF 50 MCG/ML IJ SOSY
PREFILLED_SYRINGE | INTRAMUSCULAR | Status: AC
  Administered 2021-08-24: 50 ug via INTRAVENOUS
  Filled 2021-08-24: qty 2

## 2021-08-24 MED ORDER — SODIUM CHLORIDE 0.9 % IV SOLN: INTRAVENOUS | Status: DC

## 2021-08-24 MED ORDER — 0.9 % SODIUM CHLORIDE (POUR BTL) OPTIME
TOPICAL | Status: DC | PRN
  Administered 2021-08-24: 1000 mL

## 2021-08-24 MED ORDER — METOCLOPRAMIDE HCL 5 MG/ML IJ SOLN: 5.0000 mg | Freq: Three times a day (TID) | INTRAMUSCULAR | Status: DC | PRN

## 2021-08-24 MED ORDER — SODIUM CHLORIDE (PF) 0.9 % IJ SOLN
INTRAMUSCULAR | Status: AC
  Filled 2021-08-24: qty 50

## 2021-08-24 MED ORDER — BUPIVACAINE LIPOSOME 1.3 % IJ SUSP
INTRAMUSCULAR | Status: DC | PRN
  Administered 2021-08-24: 20 mL

## 2021-08-24 MED ORDER — SODIUM CHLORIDE 0.9 % IR SOLN
Status: DC | PRN
  Administered 2021-08-24: 1000 mL

## 2021-08-24 MED ORDER — PHENYLEPHRINE HCL-NACL 20-0.9 MG/250ML-% IV SOLN
INTRAVENOUS | Status: DC | PRN
  Administered 2021-08-24: 80 ug via INTRAVENOUS

## 2021-08-24 MED ORDER — PROPOFOL 10 MG/ML IV BOLUS
INTRAVENOUS | Status: DC | PRN
  Administered 2021-08-24: 130 mg via INTRAVENOUS

## 2021-08-24 MED ORDER — DEXAMETHASONE SODIUM PHOSPHATE 10 MG/ML IJ SOLN
8.0000 mg | Freq: Once | INTRAMUSCULAR | Status: AC
  Administered 2021-08-24: 8 mg via INTRAVENOUS

## 2021-08-24 MED ORDER — CEFAZOLIN SODIUM-DEXTROSE 2-4 GM/100ML-% IV SOLN
2.0000 g | Freq: Four times a day (QID) | INTRAVENOUS | Status: AC
  Administered 2021-08-24 (×2): 2 g via INTRAVENOUS
  Filled 2021-08-24 (×2): qty 100

## 2021-08-24 MED ORDER — FENTANYL CITRATE (PF) 250 MCG/5ML IJ SOLN
INTRAMUSCULAR | Status: AC
  Filled 2021-08-24: qty 5

## 2021-08-24 MED ORDER — METHOCARBAMOL 500 MG IVPB - SIMPLE MED
INTRAVENOUS | Status: AC
  Filled 2021-08-24: qty 50

## 2021-08-24 MED ORDER — ONDANSETRON HCL 4 MG/2ML IJ SOLN
INTRAMUSCULAR | Status: DC | PRN
  Administered 2021-08-24: 4 mg via INTRAVENOUS

## 2021-08-24 MED ORDER — ONDANSETRON HCL 4 MG PO TABS: 4.0000 mg | ORAL_TABLET | Freq: Four times a day (QID) | ORAL | Status: DC | PRN

## 2021-08-24 MED ORDER — LACTATED RINGERS IV SOLN
INTRAVENOUS | Status: DC
Start: 2021-08-24 — End: 2021-08-24

## 2021-08-24 MED ORDER — METOCLOPRAMIDE HCL 5 MG PO TABS: 5.0000 mg | ORAL_TABLET | Freq: Three times a day (TID) | ORAL | Status: DC | PRN

## 2021-08-24 MED ORDER — HYDROMORPHONE HCL 1 MG/ML IJ SOLN
INTRAMUSCULAR | Status: AC
  Filled 2021-08-24: qty 2

## 2021-08-24 MED ORDER — PHENYLEPHRINE HCL-NACL 20-0.9 MG/250ML-% IV SOLN
INTRAVENOUS | Status: DC | PRN
  Administered 2021-08-24: 35 ug/min via INTRAVENOUS

## 2021-08-24 MED ORDER — MENTHOL 3 MG MT LOZG: 1.0000 | LOZENGE | OROMUCOSAL | Status: DC | PRN

## 2021-08-24 MED ORDER — ONDANSETRON HCL 4 MG/2ML IJ SOLN
INTRAMUSCULAR | Status: AC
  Filled 2021-08-24: qty 2

## 2021-08-24 MED ORDER — METHOCARBAMOL 500 MG IVPB - SIMPLE MED
500.0000 mg | Freq: Four times a day (QID) | INTRAVENOUS | Status: DC | PRN
  Administered 2021-08-24: 500 mg via INTRAVENOUS
  Filled 2021-08-24: qty 50

## 2021-08-24 MED ORDER — SCOPOLAMINE 1 MG/3DAYS TD PT72
MEDICATED_PATCH | TRANSDERMAL | Status: AC
  Filled 2021-08-24: qty 1

## 2021-08-24 MED ORDER — DIPHENHYDRAMINE HCL 12.5 MG/5ML PO ELIX: 12.5000 mg | ORAL_SOLUTION | ORAL | Status: DC | PRN

## 2021-08-24 MED ORDER — FLEET ENEMA 7-19 GM/118ML RE ENEM: 1.0000 | ENEMA | Freq: Once | RECTAL | Status: DC | PRN

## 2021-08-24 MED ORDER — METHOCARBAMOL 500 MG PO TABS
500.0000 mg | ORAL_TABLET | Freq: Four times a day (QID) | ORAL | Status: DC | PRN
  Administered 2021-08-24 – 2021-08-25 (×3): 500 mg via ORAL
  Filled 2021-08-24 (×3): qty 1

## 2021-08-24 MED ORDER — BUPROPION HCL ER (XL) 300 MG PO TB24
300.0000 mg | ORAL_TABLET | Freq: Every day | ORAL | Status: DC
Start: 2021-08-25 — End: 2021-08-25
  Administered 2021-08-25: 300 mg via ORAL
  Filled 2021-08-24: qty 1

## 2021-08-24 MED ORDER — LEVOTHYROXINE SODIUM 75 MCG PO TABS
75.0000 ug | ORAL_TABLET | Freq: Every day | ORAL | Status: DC
  Administered 2021-08-25: 75 ug via ORAL
  Filled 2021-08-24: qty 1

## 2021-08-24 MED ORDER — BUPIVACAINE HCL (PF) 0.5 % IJ SOLN
INTRAMUSCULAR | Status: DC | PRN
  Administered 2021-08-24: 30 mL via PERINEURAL

## 2021-08-24 MED ORDER — MORPHINE SULFATE (PF) 2 MG/ML IV SOLN
0.5000 mg | INTRAVENOUS | Status: DC | PRN
  Administered 2021-08-24: 1 mg via INTRAVENOUS
  Filled 2021-08-24: qty 1

## 2021-08-24 MED ORDER — CHLORHEXIDINE GLUCONATE 0.12 % MT SOLN
15.0000 mL | Freq: Once | OROMUCOSAL | Status: AC
  Administered 2021-08-24: 15 mL via OROMUCOSAL

## 2021-08-24 MED ORDER — TRANEXAMIC ACID-NACL 1000-0.7 MG/100ML-% IV SOLN
1000.0000 mg | INTRAVENOUS | Status: AC
  Administered 2021-08-24: 1000 mg via INTRAVENOUS
  Filled 2021-08-24: qty 100

## 2021-08-24 MED ORDER — STERILE WATER FOR IRRIGATION IR SOLN
Status: DC | PRN
  Administered 2021-08-24: 2000 mL

## 2021-08-24 MED ORDER — BUPIVACAINE LIPOSOME 1.3 % IJ SUSP
INTRAMUSCULAR | Status: AC
  Filled 2021-08-24: qty 20

## 2021-08-24 MED ORDER — DEXAMETHASONE SODIUM PHOSPHATE 10 MG/ML IJ SOLN
10.0000 mg | Freq: Once | INTRAMUSCULAR | Status: AC
  Administered 2021-08-25: 10 mg via INTRAVENOUS
  Filled 2021-08-24: qty 1

## 2021-08-24 MED ORDER — ASPIRIN EC 325 MG PO TBEC
325.0000 mg | DELAYED_RELEASE_TABLET | Freq: Two times a day (BID) | ORAL | Status: DC
  Administered 2021-08-25: 325 mg via ORAL
  Filled 2021-08-24: qty 1

## 2021-08-24 MED ORDER — ACETAMINOPHEN 500 MG PO TABS
1000.0000 mg | ORAL_TABLET | Freq: Four times a day (QID) | ORAL | Status: AC
  Administered 2021-08-24 – 2021-08-25 (×4): 1000 mg via ORAL
  Filled 2021-08-24 (×5): qty 2

## 2021-08-24 MED ORDER — HYDROMORPHONE HCL 2 MG PO TABS
2.0000 mg | ORAL_TABLET | Freq: Four times a day (QID) | ORAL | Status: DC | PRN
  Administered 2021-08-24: 4 mg via ORAL
  Administered 2021-08-24: 2 mg via ORAL
  Administered 2021-08-25: 4 mg via ORAL
  Filled 2021-08-24: qty 2
  Filled 2021-08-24: qty 1
  Filled 2021-08-24 (×3): qty 2

## 2021-08-24 MED ORDER — CEFAZOLIN SODIUM-DEXTROSE 2-4 GM/100ML-% IV SOLN
2.0000 g | INTRAVENOUS | Status: AC
Start: 2021-08-24 — End: 2021-08-24
  Administered 2021-08-24: 2 g via INTRAVENOUS
  Filled 2021-08-24: qty 100

## 2021-08-24 MED ORDER — POLYETHYLENE GLYCOL 3350 17 G PO PACK: 17.0000 g | PACK | Freq: Every day | ORAL | Status: DC | PRN

## 2021-08-24 MED ORDER — DEXAMETHASONE SODIUM PHOSPHATE 10 MG/ML IJ SOLN
INTRAMUSCULAR | Status: DC | PRN
  Administered 2021-08-24: 10 mg

## 2021-08-24 MED ORDER — POVIDONE-IODINE 10 % EX SWAB
2.0000 "application " | Freq: Once | CUTANEOUS | Status: AC
  Administered 2021-08-24: 2 via TOPICAL

## 2021-08-24 MED ORDER — PROPOFOL 10 MG/ML IV BOLUS
INTRAVENOUS | Status: AC
  Filled 2021-08-24: qty 20

## 2021-08-24 SURGICAL SUPPLY — 54 items
BAG COUNTER SPONGE SURGICOUNT (BAG) ×1 IMPLANT
BAG SPEC THK2 15X12 ZIP CLS (MISCELLANEOUS) ×1
BAG SPNG CNTER NS LX DISP (BAG) ×1
BAG ZIPLOCK 12X15 (MISCELLANEOUS) ×2 IMPLANT
BLADE SAG 18X100X1.27 (BLADE) ×2 IMPLANT
BLADE SAW SGTL 11.0X1.19X90.0M (BLADE) ×2 IMPLANT
BNDG ELASTIC 6X5.8 VLCR STR LF (GAUZE/BANDAGES/DRESSINGS) ×2 IMPLANT
BOWL SMART MIX CTS (DISPOSABLE) ×2 IMPLANT
CEMENT HV SMART SET (Cement) ×4 IMPLANT
CEMENT TIBIA MBT (Knees) IMPLANT
COVER SURGICAL LIGHT HANDLE (MISCELLANEOUS) ×2 IMPLANT
CUFF TOURN SGL QUICK 34 (TOURNIQUET CUFF)
CUFF TRNQT CYL 34X4.125X (TOURNIQUET CUFF) ×1 IMPLANT
DRAPE INCISE IOBAN 66X45 STRL (DRAPES) ×1 IMPLANT
DRAPE U-SHAPE 47X51 STRL (DRAPES) ×2 IMPLANT
DRSG AQUACEL AG ADV 3.5X10 (GAUZE/BANDAGES/DRESSINGS) ×2 IMPLANT
DURAPREP 26ML APPLICATOR (WOUND CARE) ×2 IMPLANT
ELECT REM PT RETURN 15FT ADLT (MISCELLANEOUS) ×2 IMPLANT
FEMUR SIGMA PS SZ 4.0N R (Femur) ×1 IMPLANT
GLOVE SRG 8 PF TXTR STRL LF DI (GLOVE) ×1 IMPLANT
GLOVE SURG ENC MOIS LTX SZ6.5 (GLOVE) ×1 IMPLANT
GLOVE SURG ENC MOIS LTX SZ8 (GLOVE) ×4 IMPLANT
GLOVE SURG UNDER POLY LF SZ7 (GLOVE) ×1 IMPLANT
GLOVE SURG UNDER POLY LF SZ8 (GLOVE) ×2
GLOVE SURG UNDER POLY LF SZ8.5 (GLOVE) ×1 IMPLANT
GOWN STRL REUS W/TWL LRG LVL3 (GOWN DISPOSABLE) ×2 IMPLANT
GOWN STRL REUS W/TWL XL LVL3 (GOWN DISPOSABLE) ×1 IMPLANT
HANDPIECE INTERPULSE COAX TIP (DISPOSABLE) ×2
HOLDER FOLEY CATH W/STRAP (MISCELLANEOUS) ×1 IMPLANT
IMMOBILIZER KNEE 20 (SOFTGOODS) ×2
IMMOBILIZER KNEE 20 THIGH 36 (SOFTGOODS) ×1 IMPLANT
KIT TURNOVER KIT A (KITS) IMPLANT
MANIFOLD NEPTUNE II (INSTRUMENTS) ×2 IMPLANT
NS IRRIG 1000ML POUR BTL (IV SOLUTION) ×2 IMPLANT
PACK TOTAL KNEE CUSTOM (KITS) ×2 IMPLANT
PADDING CAST COTTON 6X4 STRL (CAST SUPPLIES) ×3 IMPLANT
PATELLA DOME PFC 38MM (Knees) ×1 IMPLANT
PLATE ROT INSERT 12.5MM SIZE 4 (Plate) ×1 IMPLANT
PROTECTOR NERVE ULNAR (MISCELLANEOUS) ×2 IMPLANT
SET HNDPC FAN SPRY TIP SCT (DISPOSABLE) ×1 IMPLANT
SPIKE FLUID TRANSFER (MISCELLANEOUS) ×2 IMPLANT
SPONGE T-LAP 18X18 ~~LOC~~+RFID (SPONGE) ×4 IMPLANT
STRIP CLOSURE SKIN 1/2X4 (GAUZE/BANDAGES/DRESSINGS) ×4 IMPLANT
SUT MNCRL AB 4-0 PS2 18 (SUTURE) ×2 IMPLANT
SUT STRATAFIX 0 PDS 27 VIOLET (SUTURE) ×2
SUT VIC AB 2-0 CT1 27 (SUTURE) ×6
SUT VIC AB 2-0 CT1 TAPERPNT 27 (SUTURE) ×3 IMPLANT
SUTURE STRATFX 0 PDS 27 VIOLET (SUTURE) ×1 IMPLANT
TIBIA MBT CEMENT (Knees) ×2 IMPLANT
TRAY FOLEY MTR SLVR 16FR STAT (SET/KITS/TRAYS/PACK) ×1 IMPLANT
TRAY FOLEY W/BAG SLVR 14FR LF (SET/KITS/TRAYS/PACK) ×1 IMPLANT
TUBE SUCTION HIGH CAP CLEAR NV (SUCTIONS) ×2 IMPLANT
WATER STERILE IRR 1000ML POUR (IV SOLUTION) ×4 IMPLANT
WRAP KNEE MAXI GEL POST OP (GAUZE/BANDAGES/DRESSINGS) ×2 IMPLANT

## 2021-08-24 NOTE — Anesthesia Procedure Notes (Signed)
Anesthesia Regional Block: Adductor canal block   Pre-Anesthetic Checklist: , timeout performed,  Correct Patient, Correct Site, Correct Laterality,  Correct Procedure, Correct Position, site marked,  Risks and benefits discussed,  Surgical consent,  Pre-op evaluation,  At surgeon's request and post-op pain management  Laterality: Right  Prep: Maximum Sterile Barrier Precautions used, chloraprep       Needles:  Injection technique: Single-shot  Needle Type: Echogenic Stimulator Needle     Needle Length: 9cm  Needle Gauge: 22     Additional Needles:   Procedures:,,,, ultrasound used (permanent image in chart),,    Narrative:  Start time: 08/24/2021 7:45 AM End time: 08/24/2021 7:50 AM Injection made incrementally with aspirations every 5 mL.  Performed by: Personally  Anesthesiologist: Pervis Hocking, DO  Additional Notes: Monitors applied. No increased pain on injection. No increased resistance to injection. Injection made in 5cc increments. Good needle visualization. Patient tolerated procedure well.

## 2021-08-24 NOTE — Progress Notes (Signed)
Orthopedic Tech Progress Note Patient Details:  Brenda Solis Jun 11, 1935 [id number]  CPM Right Knee CPM Right Knee: On Right Knee Flexion (Degrees): 40 Right Knee Extension (Degrees): 10  Post Interventions Patient Tolerated: Well Instructions Provided: Care of device, Adjustment of device  [place] Pink 08/24/2021, 9:53 AM

## 2021-08-24 NOTE — Anesthesia Procedure Notes (Signed)
Procedure Name: LMA Insertion Date/Time: 08/24/2021 8:16 AM Performed by: Maxwell Caul, CRNA Pre-anesthesia Checklist: Patient identified, Emergency Drugs available, Suction available and Patient being monitored Patient Re-evaluated:Patient Re-evaluated prior to induction Oxygen Delivery Method: Circle system utilized Preoxygenation: Pre-oxygenation with 100% oxygen Induction Type: IV induction LMA: LMA with gastric port inserted LMA Size: 4.0 Number of attempts: 1 Placement Confirmation: positive ETCO2 and breath sounds checked- equal and bilateral Tube secured with: Tape Dental Injury: Teeth and Oropharynx as per pre-operative assessment

## 2021-08-24 NOTE — Progress Notes (Signed)
AssistedDr. Beth Finucane with right, ultrasound guided, adductor canal block. Side rails up, monitors on throughout procedure. See vital signs in flow sheet. Tolerated Procedure well.  

## 2021-08-24 NOTE — Op Note (Signed)
OPERATIVE REPORT-TOTAL KNEE ARTHROPLASTY   Pre-operative diagnosis- Osteoarthritis  Right knee(s)  Post-operative diagnosis- Osteoarthritis Right knee(s)  Procedure-  Right  Total Knee Arthroplasty  Surgeon- Dione Plover. Jeziah Kretschmer, MD  Assistant- Molli Barrows, PA-C   Anesthesia-  Adductor canal block and general  EBL- 25 ml   Drains None  Tourniquet time-  Total Tourniquet Time Documented: Thigh (Right) - 31 minutes Total: Thigh (Right) - 31 minutes     Complications- None  Condition-PACU - hemodynamically stable.   Brief Clinical Note  Brenda Solis is a 87 y.o. year old female with end stage OA of her left knee with progressively worsening pain and dysfunction. She has constant pain, with activity and at rest and significant functional deficits with difficulties even with ADLs. She has had extensive non-op management including analgesics, injections of cortisone and viscosupplements, and home exercise program, but remains in significant pain with significant dysfunction. Radiographs show bone on bone arthritis lateral and patellofemoral with valgus deformity. She presents now for left Total Knee Arthroplasty.     Procedure in detail---   The patient is brought into the operating room and positioned supine on the operating table. After successful administration of  GA combined with regional for post-op pain,   a tourniquet is placed high on the Left thigh(s) and the lower extremity is prepped and draped in the usual sterile fashion. Time out is performed by the operating team and then the  Left lower extremity is wrapped in Esmarch, knee flexed and the tourniquet inflated to 300 mmHg.       A midline incision is made with a ten blade through the subcutaneous tissue to the level of the extensor mechanism. A fresh blade is used to make a medial parapatellar arthrotomy. Soft tissue over the proximal medial tibia is subperiosteally elevated to the joint line with a knife and into the  semimembranosus bursa with a Cobb elevator. Soft tissue over the proximal lateral tibia is elevated with attention being paid to avoiding the patellar tendon on the tibial tubercle. The patella is everted, knee flexed 90 degrees and the ACL and PCL are removed. Findings are bone on bone lateral and patellofemoral with large global osteophytes        The drill is used to create a starting hole in the distal femur and the canal is thoroughly irrigated with sterile saline to remove the fatty contents. The 5 degree Left  valgus alignment guide is placed into the femoral canal and the distal femoral cutting block is pinned to remove 10 mm off the distal femur. Resection is made with an oscillating saw.      The tibia is subluxed forward and the menisci are removed. The extramedullary alignment guide is placed referencing proximally at the medial aspect of the tibial tubercle and distally along the second metatarsal axis and tibial crest. The block is pinned to remove 57mm off the more deficient lateral  side. Resection is made with an oscillating saw. Size 3is the most appropriate size for the tibia and the proximal tibia is prepared with the modular drill and keel punch for that size.      The femoral sizing guide is placed and size 4 is most appropriate. Rotation is marked off the epicondylar axis and confirmed by creating a rectangular flexion gap at 90 degrees. The size 4 cutting block is pinned in this rotation and the anterior, posterior and chamfer cuts are made with the oscillating saw. The intercondylar block is then placed  and that cut is made.      Trial size 3 tibial component, trial size 4 narrow posterior stabilized femur and a 12.5  mm posterior stabilized rotating platform insert trial is placed. Full extension is achieved with excellent varus/valgus and anterior/posterior balance throughout full range of motion. The patella is everted and thickness measured to be 22  mm. Free hand resection is taken  to 12 mm, a 38 template is placed, lug holes are drilled, trial patella is placed, and it tracks normally. Osteophytes are removed off the posterior femur with the trial in place. All trials are removed and the cut bone surfaces prepared with pulsatile lavage. Cement is mixed and once ready for implantation, the size 3 tibial implant, size  4 narrow posterior stabilized femoral component, and the size 38 patella are cemented in place and the patella is held with the clamp. The trial insert is placed and the knee held in full extension. The Exparel (20 ml mixed with 60 ml saline) is injected into the extensor mechanism, posterior capsule, medial and lateral gutters and subcutaneous tissues.  All extruded cement is removed and once the cement is hard the permanent 12.5 mm posterior stabilized rotating platform insert is placed into the tibial tray.      The wound is copiously irrigated with saline solution and the extensor mechanism closed with # 0 Stratofix suture. The tourniquet is released for a total tourniquet time of 31  minutes. Flexion against gravity is 140 degrees and the patella tracks normally. Subcutaneous tissue is closed with 2.0 vicryl and subcuticular with running 4.0 Monocryl. The incision is cleaned and dried and steri-strips and a bulky sterile dressing are applied. The limb is placed into a knee immobilizer and the patient is awakened and transported to recovery in stable condition.      Please note that a surgical assistant was a medical necessity for this procedure in order to perform it in a safe and expeditious manner. Surgical assistant was necessary to retract the ligaments and vital neurovascular structures to prevent injury to them and also necessary for proper positioning of the limb to allow for anatomic placement of the prosthesis.   Dione Plover [place], MD    08/24/2021, 9:22 AM

## 2021-08-24 NOTE — Anesthesia Postprocedure Evaluation (Signed)
Anesthesia Post Note  Patient: Brenda Solis  Procedure(s) Performed: TOTAL KNEE ARTHROPLASTY (Right: Knee)     Patient location during evaluation: PACU Anesthesia Type: Regional and General Level of consciousness: awake and alert, oriented and patient cooperative Pain management: pain level controlled Vital Signs Assessment: post-procedure vital signs reviewed and stable Respiratory status: spontaneous breathing, nonlabored ventilation and respiratory function stable Cardiovascular status: blood pressure returned to baseline and stable Postop Assessment: no apparent nausea or vomiting Anesthetic complications: no   No notable events documented.  Last Vitals:  Vitals:   08/24/21 1030 08/24/21 1045  BP: (!) 155/63 133/81  Pulse: 93 90  Resp: 18 14  Temp:    SpO2: 94% 94%    Last Pain:  Vitals:   08/24/21 1045  TempSrc:   PainSc: [place]

## 2021-08-24 NOTE — Care Plan (Signed)
Ortho Bundle Case Management Note  Patient Details  Name: APREL EGELHOFF MRN: [id number] Date of Birth: 1935/01/02                  R TKA on 08-24-21 DCP: Home with family. DME: Needs a RW PT: [facility]. PT eval scheduled on 08-27-21 at 3:15 pm.   DME Arranged:  Gilford Rile rolling DME Agency:  [employer]  HH Arranged:    [place] Agency:     Additional Comments: Please contact me with any questions of if this plan should need to change.  Marianne Sofia, RN,CCM [facility]  [phone] 08/24/2021, 9:45 AM

## 2021-08-24 NOTE — Transfer of Care (Signed)
Immediate Anesthesia Transfer of Care Note  Patient: CERENA BAINE  Procedure(s) Performed: TOTAL KNEE ARTHROPLASTY (Right: Knee)  Patient Location: PACU  Anesthesia Type:General  Level of Consciousness: awake, alert  and oriented  Airway & Oxygen Therapy: Patient Spontanous Breathing and Patient connected to face mask oxygen  Post-op Assessment: Report given to RN and Post -op Vital signs reviewed and stable  Post vital signs: Reviewed and stable  Last Vitals:  Vitals Value Taken Time  BP 154/72 08/24/21 0946  Temp    Pulse 102 08/24/21 0949  Resp 17 08/24/21 0949  SpO2 97 % 08/24/21 0949  Vitals shown include unvalidated device data.  Last Pain:  Vitals:   08/24/21 0542  TempSrc: Oral      Patients Stated Pain Goal: 3 (82/57/49 3552)  Complications: No notable events documented.

## 2021-08-24 NOTE — Interval H&P Note (Signed)
History and Physical Interval Note:  08/24/2021 6:31 AM  Brenda Solis  has presented today for surgery, with the diagnosis of right knee osteoarthritis.  The various methods of treatment have been discussed with the patient and family. After consideration of risks, benefits and other options for treatment, the patient has consented to  Procedure(s): TOTAL KNEE ARTHROPLASTY (Right) as a surgical intervention.  The patient's history has been reviewed, patient examined, no change in status, stable for surgery.  I have reviewed the patient's chart and labs.  Questions were answered to the patient's satisfaction.     Pilar Plate Jared Cahn

## 2021-08-24 NOTE — Discharge Instructions (Signed)
 Frank Aluisio, MD Total Joint Specialist [facility] [place] [address] [place], [place] [address] [phone]  TOTAL KNEE REPLACEMENT POSTOPERATIVE DIRECTIONS    Knee Rehabilitation, Guidelines Following Surgery  Results after knee surgery are often greatly improved when you follow the exercise, range of motion and muscle strengthening exercises prescribed by your doctor. Safety measures are also important to protect the knee from further injury. If any of these exercises cause you to have increased pain or swelling in your knee joint, decrease the amount until you are comfortable again and slowly increase them. If you have problems or questions, call your caregiver or physical therapist for advice.   BLOOD CLOT PREVENTION Take a 325 mg Aspirin two times a day for three weeks following surgery. Then take an 81 mg Aspirin once a day for three weeks. Then discontinue Aspirin. You may resume your vitamins/supplements upon discharge from the hospital. Do not take any NSAIDs (Advil, Aleve, Ibuprofen, Meloxicam, etc.) until you have discontinued the 325 mg Aspirin.  HOME CARE INSTRUCTIONS  Remove items at home which could result in a fall. This includes throw rugs or furniture in walking pathways.  ICE to the affected knee as much as tolerated. Icing helps control swelling. If the swelling is well controlled you will be more comfortable and rehab easier. Continue to use ice on the knee for pain and swelling from surgery. You may notice swelling that will progress down to the foot and ankle. This is normal after surgery. Elevate the leg when you are not up walking on it.    Continue to use the breathing machine which will help keep your temperature down. It is common for your temperature to cycle up and down following surgery, especially at night when you are not up moving around and exerting yourself. The breathing machine keeps your lungs expanded and your temperature  down. Do not place pillow under the operative knee, focus on keeping the knee straight while resting  DIET You may resume your previous home diet once you are discharged from the hospital.  DRESSING / WOUND CARE / SHOWERING Keep your bulky bandage on for 2 days. On the third post-operative day you may remove the Ace bandage and gauze. There is a waterproof adhesive bandage on your skin which will stay in place until your first follow-up appointment. Once you remove this you will not need to place another bandage You may begin showering 3 days following surgery, but do not submerge the incision under water.  ACTIVITY For the first 5 days, the key is rest and control of pain and swelling Do your home exercises twice a day starting on post-operative day 3. On the days you go to physical therapy, just do the home exercises once that day. You should rest, ice and elevate the leg for 50 minutes out of every hour. Get up and walk/stretch for 10 minutes per hour. After 5 days you can increase your activity slowly as tolerated. Walk with your walker as instructed. Use the walker until you are comfortable transitioning to a cane. Walk with the cane in the opposite hand of the operative leg. You may discontinue the cane once you are comfortable and walking steadily. Avoid periods of inactivity such as sitting longer than an hour when not asleep. This helps prevent blood clots.  You may discontinue the knee immobilizer once you are able to perform a straight leg raise while lying down. You may resume a sexual relationship in one month   or when given the OK by your doctor.  You may return to work once you are cleared by your doctor.  Do not drive a car for 6 weeks or until released by your surgeon.  Do not drive while taking narcotics.  TED HOSE STOCKINGS Wear the elastic stockings on both legs for three weeks following surgery during the day. You may remove them at night for sleeping.  WEIGHT  BEARING Weight bearing as tolerated with assist device (walker, cane, etc) as directed, use it as long as suggested by your surgeon or therapist, typically at least 4-6 weeks.  POSTOPERATIVE CONSTIPATION PROTOCOL Constipation - defined medically as fewer than three stools per week and severe constipation as less than one stool per week.  One of the most common issues patients have following surgery is constipation.  Even if you have a regular bowel pattern at home, your normal regimen is likely to be disrupted due to multiple reasons following surgery.  Combination of anesthesia, postoperative narcotics, change in appetite and fluid intake all can affect your bowels.  In order to avoid complications following surgery, here are some recommendations in order to help you during your recovery period.  Colace (docusate) - Pick up an over-the-counter form of Colace or another stool softener and take twice a day as long as you are requiring postoperative pain medications.  Take with a full glass of water daily.  If you experience loose stools or diarrhea, hold the colace until you stool forms back up. If your symptoms do not get better within 1 week or if they get worse, check with your doctor. Dulcolax (bisacodyl) - Pick up over-the-counter and take as directed by the product packaging as needed to assist with the movement of your bowels.  Take with a full glass of water.  Use this product as needed if not relieved by Colace only.  MiraLax (polyethylene glycol) - Pick up over-the-counter to have on hand. MiraLax is a solution that will increase the amount of water in your bowels to assist with bowel movements.  Take as directed and can mix with a glass of water, juice, soda, coffee, or tea. Take if you go more than two days without a movement. Do not use MiraLax more than once per day. Call your doctor if you are still constipated or irregular after using this medication for 7 days in a row.  If you continue  to have problems with postoperative constipation, please contact the office for further assistance and recommendations.  If you experience "the worst abdominal pain ever" or develop nausea or vomiting, please contact the office immediatly for further recommendations for treatment.  ITCHING If you experience itching with your medications, try taking only a single pain pill, or even half a pain pill at a time.  You can also use Benadryl over the counter for itching or also to help with sleep.   MEDICATIONS See your medication summary on the "After Visit Summary" that the nursing staff will review with you prior to discharge.  You may have some home medications which will be placed on hold until you complete the course of blood thinner medication.  It is important for you to complete the blood thinner medication as prescribed by your surgeon.  Continue your approved medications as instructed at time of discharge.  PRECAUTIONS If you experience chest pain or shortness of breath - call 911 immediately for transfer to the hospital emergency department.  If you develop a fever greater that 101 F,   purulent drainage from wound, increased redness or drainage from wound, foul odor from the wound/dressing, or calf pain - CONTACT YOUR SURGEON.                                                   FOLLOW-UP APPOINTMENTS Make sure you keep all of your appointments after your operation with your surgeon and caregivers. You should call the office at the above phone number and make an appointment for approximately two weeks after the date of your surgery or on the date instructed by your surgeon outlined in the "After Visit Summary".  RANGE OF MOTION AND STRENGTHENING EXERCISES  Rehabilitation of the knee is important following a knee injury or an operation. After just a few days of immobilization, the muscles of the thigh which control the knee become weakened and shrink (atrophy). Knee exercises are designed to build up  the tone and strength of the thigh muscles and to improve knee motion. Often times heat used for twenty to thirty minutes before working out will loosen up your tissues and help with improving the range of motion but do not use heat for the first two weeks following surgery. These exercises can be done on a training (exercise) mat, on the floor, on a table or on a bed. Use what ever works the best and is most comfortable for you Knee exercises include:  Leg Lifts - While your knee is still immobilized in a splint or cast, you can do straight leg raises. Lift the leg to 60 degrees, hold for 3 sec, and slowly lower the leg. Repeat 10-20 times 2-3 times daily. Perform this exercise against resistance later as your knee gets better.  Quad and Hamstring Sets - Tighten up the muscle on the front of the thigh (Quad) and hold for 5-10 sec. Repeat this 10-20 times hourly. Hamstring sets are done by pushing the foot backward against an object and holding for 5-10 sec. Repeat as with quad sets.  Leg Slides: Lying on your back, slowly slide your foot toward your buttocks, bending your knee up off the floor (only go as far as is comfortable). Then slowly slide your foot back down until your leg is flat on the floor again. Angel Wings: Lying on your back spread your legs to the side as far apart as you can without causing discomfort.  A rehabilitation program following serious knee injuries can speed recovery and prevent re-injury in the future due to weakened muscles. Contact your doctor or a physical therapist for more information on knee rehabilitation.   POST-OPERATIVE OPIOID TAPER INSTRUCTIONS: It is important to wean off of your opioid medication as soon as possible. If you do not need pain medication after your surgery it is ok to stop day one. Opioids include: Codeine, Hydrocodone(Norco, Vicodin), Oxycodone(Percocet, oxycontin) and hydromorphone amongst others.  Long term and even short term use of opiods can  cause: Increased pain response Dependence Constipation Depression Respiratory depression And more.  Withdrawal symptoms can include Flu like symptoms Nausea, vomiting And more Techniques to manage these symptoms Hydrate well Eat regular healthy meals Stay active Use relaxation techniques(deep breathing, meditating, yoga) Do Not substitute Alcohol to help with tapering If you have been on opioids for less than two weeks and do not have pain than it is ok to stop all together.  Plan   to wean off of opioids This plan should start within one week post op of your joint replacement. Maintain the same interval or time between taking each dose and first decrease the dose.  Cut the total daily intake of opioids by one tablet each day Next start to increase the time between doses. The last dose that should be eliminated is the evening dose.   IF YOU ARE TRANSFERRED TO A SKILLED REHAB FACILITY If the patient is transferred to a skilled rehab facility following release from the hospital, a list of the current medications will be sent to the facility for the patient to continue.  When discharged from the skilled rehab facility, please have the facility set up the patient's Home Health Physical Therapy prior to being released. Also, the skilled facility will be responsible for providing the patient with their medications at time of release from the facility to include their pain medication, the muscle relaxants, and their blood thinner medication. If the patient is still at the rehab facility at time of the two week follow up appointment, the skilled rehab facility will also need to assist the patient in arranging follow up appointment in our office and any transportation needs.  MAKE SURE YOU:  Understand these instructions.  Get help right away if you are not doing well or get worse.   DENTAL ANTIBIOTICS:  In most cases prophylactic antibiotics for Dental procdeures after total joint surgery are  not necessary.  Exceptions are as follows:  1. History of prior total joint infection  2. Severely immunocompromised (Organ Transplant, cancer chemotherapy, Rheumatoid biologic meds such as Humera)  3. Poorly controlled diabetes (A1C &gt; 8.0, blood glucose over 200)  If you have one of these conditions, contact your surgeon for an antibiotic prescription, prior to your dental procedure.    Pick up stool softner and laxative for home use following surgery while on pain medications. Do not submerge incision under water. Please use good hand washing techniques while changing dressing each day. May shower starting three days after surgery. Please use a clean towel to pat the incision dry following showers. Continue to use ice for pain and swelling after surgery. Do not use any lotions or creams on the incision until instructed by your surgeon.  

## 2021-08-24 NOTE — Evaluation (Signed)
Physical Therapy Evaluation Patient Details Name: Brenda Solis MRN: [id number] DOB: 07/05/1934 Today's Date: 08/24/2021  History of Present Illness  87 yo female s/p R TKA. PMH: spinal cord stimulator,  depression, HTN, lumbar laminectomy and revision of lum lam.  Clinical Impression  Pt is s/p TKA resulting in the deficits listed below (see PT Problem List).  Pt amb ~ 20' with RW and min assist. Anticipate steady progress ina cute setting, initiated HEP and reviewed knee precautions  Pt will benefit from skilled PT to increase their independence and safety with mobility to allow discharge to the venue listed below.         Recommendations for follow up therapy are one component of a multi-disciplinary discharge planning process, led by the attending physician.  Recommendations may be updated based on patient status, additional functional criteria and insurance authorization.  Follow Up Recommendations Follow physician's recommendations for discharge plan and follow up therapies    Assistance Recommended at Discharge Intermittent Supervision/Assistance  Patient can return home with the following  Help with stairs or ramp for entrance;Assist for transportation;Assistance with cooking/housework    Equipment Recommendations None recommended by PT  Recommendations for Other Services       Functional Status Assessment Patient has had a recent decline in their functional status and demonstrates the ability to make significant improvements in function in a reasonable and predictable amount of time.     Precautions / Restrictions Precautions Precautions: Fall;Knee Precaution Comments: IND SLRs today, KI not utilized, reviewed use Required Braces or Orthoses: Knee Immobilizer - Right Knee Immobilizer - Right: Discontinue once straight leg raise with < 10 degree lag Restrictions Weight Bearing Restrictions: No      Mobility  Bed Mobility Overal bed mobility: Needs Assistance Bed  Mobility: Supine to Sit     Supine to sit: Min guard     General bed mobility comments: for safety    Transfers Overall transfer level: Needs assistance Equipment used: Rolling walker (2 wheels) Transfers: Sit to/from Stand Sit to Stand: Min assist           General transfer comment: cues for hand placement and RLE position    Ambulation/Gait Ambulation/Gait assistance: Min assist Gait Distance (Feet): 20 Feet Assistive device: Rolling walker (2 wheels) Gait Pattern/deviations: Step-to pattern, Decreased stance time - right       General Gait Details: cues for sequence, RW position  Stairs            Wheelchair Mobility    Modified Rankin (Stroke Patients Only)       Balance                                             Pertinent Vitals/Pain      Home Living Family/patient expects to be discharged to:: Private residence Living Arrangements: Alone Available Help at Discharge:  (children "rotating") Type of Home: House Home Access: Stairs to enter Entrance Stairs-Rails: Right Entrance Stairs-Number of Steps: 3   Home Layout: One level Home Equipment: Conservation officer, nature (2 wheels);Rollator (4 wheels);BSC/3in1      Prior Function Prior Level of Function : Independent/Modified Independent                     Hand Dominance        Extremity/Trunk Assessment   Upper Extremity Assessment Upper Extremity Assessment: Overall [facility]  for tasks assessed    Lower Extremity Assessment Lower Extremity Assessment: RLE deficits/detail RLE Deficits / Details: ankle WFL, knee extension and  hip flexion 2+/5       Communication      Cognition                                                General Comments      Exercises Total Joint Exercises Ankle Circles/Pumps: AROM, Both, 10 reps Quad Sets: AROM, Right, 5 reps   Assessment/Plan    PT Assessment Patient needs continued PT services  PT Problem List  Decreased strength;Decreased range of motion;Decreased activity tolerance;Pain;Decreased knowledge of use of DME;Decreased mobility       PT Treatment Interventions DME instruction;Therapeutic exercise;Gait training;Stair training;Patient/family education;Therapeutic activities;Functional mobility training    PT Goals (Current goals can be found in the Care Plan section)  Acute Rehab PT Goals Patient Stated Goal: have less knee pain PT Goal Formulation: With patient Time For Goal Achievement: 08/31/21 Potential to Achieve Goals: Good    Frequency 7X/week     Co-evaluation               AM-PAC PT "6 Clicks" Mobility  Outcome Measure Help needed turning from your back to your side while in a flat bed without using bedrails?: A Little Help needed moving from lying on your back to sitting on the side of a flat bed without using bedrails?: A Little Help needed moving to and from a bed to a chair (including a wheelchair)?: A Little Help needed standing up from a chair using your arms (e.g., wheelchair or bedside chair)?: A Little Help needed to walk in hospital room?: A Little Help needed climbing 3-5 steps with a railing? : A Little 6 Click Score: 18    End of Session Equipment Utilized During Treatment: Gait belt Activity Tolerance: Patient tolerated treatment well Patient left: in chair;with call bell/phone within reach;with chair alarm set   PT Visit Diagnosis: Other abnormalities of gait and mobility (R26.89);Difficulty in walking, not elsewhere classified (R26.2)    Time: 2505-3976 PT Time Calculation (min) (ACUTE ONLY): 18 min   Charges:   PT Evaluation $PT Eval Low Complexity: [place], PT  Acute Rehab Dept ([facility]) [phone] Pager [phone]  08/24/2021   [facility] 08/24/2021, 3:57 PM

## 2021-08-25 ENCOUNTER — Encounter (HOSPITAL_COMMUNITY): Payer: Self-pay | Admitting: Orthopedic Surgery

## 2021-08-25 DIAGNOSIS — Z96651 Presence of right artificial knee joint: Secondary | ICD-10-CM | POA: Diagnosis not present

## 2021-08-25 DIAGNOSIS — Z79899 Other long term (current) drug therapy: Secondary | ICD-10-CM | POA: Diagnosis not present

## 2021-08-25 DIAGNOSIS — M1711 Unilateral primary osteoarthritis, right knee: Secondary | ICD-10-CM | POA: Diagnosis not present

## 2021-08-25 DIAGNOSIS — E039 Hypothyroidism, unspecified: Secondary | ICD-10-CM | POA: Diagnosis not present

## 2021-08-25 DIAGNOSIS — Z20822 Contact with and (suspected) exposure to covid-19: Secondary | ICD-10-CM | POA: Diagnosis not present

## 2021-08-25 DIAGNOSIS — I10 Essential (primary) hypertension: Secondary | ICD-10-CM | POA: Diagnosis not present

## 2021-08-25 DIAGNOSIS — Z96641 Presence of right artificial hip joint: Secondary | ICD-10-CM | POA: Diagnosis not present

## 2021-08-25 LAB — BASIC METABOLIC PANEL
Anion gap: 6 (ref 5–15)
BUN: 16 mg/dL (ref 8–23)
CO2: 24 mmol/L (ref 22–32)
Calcium: 9.3 mg/dL (ref 8.9–10.3)
Chloride: 106 mmol/L (ref 98–111)
Creatinine, Ser: 0.94 mg/dL (ref 0.44–1.00)
GFR, Estimated: 60 mL/min — ABNORMAL LOW (ref >60)
Glucose, Bld: 164 mg/dL — ABNORMAL HIGH (ref 70–99)
Potassium: 4.8 mmol/L (ref 3.5–5.1)
Sodium: 136 mmol/L (ref 135–145)

## 2021-08-25 LAB — CBC
HCT: 32.2 % — ABNORMAL LOW (ref 36.0–46.0)
Hemoglobin: 10.5 g/dL — ABNORMAL LOW (ref 12.0–15.0)
MCH: 29.8 pg (ref 26.0–34.0)
MCHC: 32.6 g/dL (ref 30.0–36.0)
MCV: 91.5 fL (ref 80.0–100.0)
Platelets: 211 10*3/uL (ref 150–400)
RBC: 3.52 MIL/uL — ABNORMAL LOW (ref 3.87–5.11)
RDW: 14.1 % (ref 11.5–15.5)
WBC: 10.7 10*3/uL — ABNORMAL HIGH (ref 4.0–10.5)
nRBC: 0 % (ref 0.0–0.2)

## 2021-08-25 MED ORDER — ASPIRIN 325 MG PO TBEC: 325.0000 mg | DELAYED_RELEASE_TABLET | Freq: Two times a day (BID) | ORAL | 0 refills | Status: AC

## 2021-08-25 MED ORDER — METHOCARBAMOL 500 MG PO TABS: 500.0000 mg | ORAL_TABLET | Freq: Four times a day (QID) | ORAL | 0 refills | Status: DC | PRN

## 2021-08-25 MED ORDER — HYDROMORPHONE HCL 2 MG PO TABS: 2.0000 mg | ORAL_TABLET | Freq: Four times a day (QID) | ORAL | 0 refills | Status: DC | PRN

## 2021-08-25 NOTE — Progress Notes (Signed)
Physical Therapy Treatment Patient Details Name: Brenda Solis MRN: [id number] DOB: 1935/02/20 Today's Date: 08/25/2021   History of Present Illness 87 yo female s/p R TKA. PMH: spinal cord stimulator,  depression, HTN, lumbar laminectomy and revision of lum lam.    PT Comments    Pt doing well today. Will see again to review stairs and pt should be ready to d/c later today.   Recommendations for follow up therapy are one component of a multi-disciplinary discharge planning process, led by the attending physician.  Recommendations may be updated based on patient status, additional functional criteria and insurance authorization.  Follow Up Recommendations  Follow physician's recommendations for discharge plan and follow up therapies     Assistance Recommended at Discharge Intermittent Supervision/Assistance  Patient can return home with the following Help with stairs or ramp for entrance;Assist for transportation;Assistance with cooking/housework   Equipment Recommendations  None recommended by PT    Recommendations for Other Services       Precautions / Restrictions Precautions Precautions: Fall;Knee Precaution Comments: IND SLRs today, KI not utilized, reviewed use Required Braces or Orthoses: Knee Immobilizer - Right Knee Immobilizer - Right: Discontinue once straight leg raise with < 10 degree lag Restrictions Weight Bearing Restrictions: No Other Position/Activity Restrictions: WBAT     Mobility  Bed Mobility               General bed mobility comments: in recliner    Transfers Overall transfer level: Needs assistance Equipment used: Rolling walker (2 wheels) Transfers: Sit to/from Stand Sit to Stand: Min guard           General transfer comment: cues for hand placement and RLE position    Ambulation/Gait Ambulation/Gait assistance: Min guard Gait Distance (Feet): 80 Feet Assistive device: Rolling walker (2 wheels) Gait Pattern/deviations:  Step-to pattern, Decreased stance time - right       General Gait Details: cues for sequence, RW position, gait progression   Stairs             Wheelchair Mobility    Modified Rankin (Stroke Patients Only)       Balance                                            Cognition Arousal/Alertness: Awake/alert Behavior During Therapy: WFL for tasks assessed/performed Overall Cognitive Status: Within Functional Limits for tasks assessed                                          Exercises Total Joint Exercises Ankle Circles/Pumps: AROM, Both, 10 reps Quad Sets: AROM, 10 reps, Both Heel Slides: 10 reps, Right Hip ABduction/ADduction: AROM, Right, 10 reps Straight Leg Raises: AROM, Right, 10 reps    General Comments        Pertinent Vitals/Pain Pain Assessment Pain Assessment: 0-10 Pain Score: 3  Pain Location: R knee Pain Descriptors / Indicators: Aching, Discomfort, Grimacing Pain Intervention(s): Limited activity within patient's tolerance, Monitored during session, Premedicated before session    Home Living                          Prior Function            PT Goals (current goals can now  be found in the care plan section) Acute Rehab PT Goals Patient Stated Goal: have less knee pain PT Goal Formulation: With patient Time For Goal Achievement: 08/31/21 Potential to Achieve Goals: Good Progress towards PT goals: Progressing toward goals    Frequency    7X/week      PT Plan Current plan remains appropriate    Co-evaluation              AM-PAC PT "6 Clicks" Mobility   Outcome Measure  Help needed turning from your back to your side while in a flat bed without using bedrails?: A Little Help needed moving from lying on your back to sitting on the side of a flat bed without using bedrails?: A Little Help needed moving to and from a bed to a chair (including a wheelchair)?: A Little Help needed  standing up from a chair using your arms (e.g., wheelchair or bedside chair)?: A Little Help needed to walk in hospital room?: A Little Help needed climbing 3-5 steps with a railing? : A Little 6 Click Score: 18    End of Session Equipment Utilized During Treatment: Gait belt Activity Tolerance: Patient tolerated treatment well Patient left: in chair;with call bell/phone within reach;with chair alarm set   PT Visit Diagnosis: Other abnormalities of gait and mobility (R26.89);Difficulty in walking, not elsewhere classified (R26.2)     Time: 1040-1055 PT Time Calculation (min) (ACUTE ONLY): 15 min  Charges:  $Gait Training: 8-22 mins                     Baxter Flattery, PT  Acute Rehab Dept ([place]) [phone] Pager [phone]  08/25/2021    [facility] 08/25/2021, 11:14 AM

## 2021-08-25 NOTE — Progress Notes (Signed)
Physical Therapy Treatment Patient Details Name: Brenda Solis MRN: [id number] DOB: December 30, 1934 Today's Date: 08/25/2021   History of Present Illness 87 yo female s/p R TKA. PMH: spinal cord stimulator,  depression, HTN, lumbar laminectomy and revision of lum lam.    PT Comments    Pt progressing, meeting PT goals. Has good family support and is ready to d/c with family assist as needed from PT standpoint.    Recommendations for follow up therapy are one component of a multi-disciplinary discharge planning process, led by the attending physician.  Recommendations may be updated based on patient status, additional functional criteria and insurance authorization.  Follow Up Recommendations  Follow physician's recommendations for discharge plan and follow up therapies     Assistance Recommended at Discharge Intermittent Supervision/Assistance  Patient can return home with the following Help with stairs or ramp for entrance;Assist for transportation;Assistance with cooking/housework   Equipment Recommendations  None recommended by PT    Recommendations for Other Services       Precautions / Restrictions Precautions Precautions: Fall;Knee Precaution Comments: IND SLRs today, KI not utilized, reviewed use Required Braces or Orthoses: Knee Immobilizer - Right Knee Immobilizer - Right: Discontinue once straight leg raise with < 10 degree lag Restrictions Weight Bearing Restrictions: No Other Position/Activity Restrictions: WBAT     Mobility  Bed Mobility Overal bed mobility: Needs Assistance Bed Mobility: Sit to Supine       Sit to supine: Supervision   General bed mobility comments: for safety    Transfers Overall transfer level: Needs assistance Equipment used: Rolling walker (2 wheels) Transfers: Sit to/from Stand Sit to Stand: Min guard, Supervision           General transfer comment: cues for hand placement and RLE position    Ambulation/Gait Ambulation/Gait  assistance: Min guard Gait Distance (Feet): 80 Feet Assistive device: Rolling walker (2 wheels) Gait Pattern/deviations: Step-to pattern, Decreased stance time - right       General Gait Details: cues for sequence, RW position, gait progression; min/guard for safety   Stairs Stairs: Yes Stairs assistance: Min guard Stair Management: One rail Right, One rail Left, Step to pattern, Sideways Number of Stairs: 3 General stair comments: cues for sequence and safe technique, dtr present for stair training; pt without knee buckling or LOB   Wheelchair Mobility    Modified Rankin (Stroke Patients Only)       Balance                                            Cognition Arousal/Alertness: Awake/alert Behavior During Therapy: WFL for tasks assessed/performed Overall Cognitive Status: Within Functional Limits for tasks assessed                                          Exercises Total Joint Exercises Ankle Circles/Pumps: AROM, Both, 10 reps Quad Sets: AROM, 10 reps, Both Heel Slides: 10 reps, Right Hip ABduction/ADduction: AROM, Right, 10 reps Straight Leg Raises: AROM, Right, 10 reps    General Comments        Pertinent Vitals/Pain Pain Assessment Pain Assessment: 0-10 Pain Score: 3  Pain Location: R knee Pain Descriptors / Indicators: Aching, Discomfort, Grimacing Pain Intervention(s): Limited activity within patient's tolerance, Monitored during session, Premedicated before session, Repositioned  Home Living                          Prior Function            PT Goals (current goals can now be found in the care plan section) Acute Rehab PT Goals Patient Stated Goal: have less knee pain PT Goal Formulation: With patient Time For Goal Achievement: 08/31/21 Potential to Achieve Goals: Good Progress towards PT goals: Progressing toward goals    Frequency    7X/week      PT Plan Current plan remains  appropriate    Co-evaluation              AM-PAC PT "6 Clicks" Mobility   Outcome Measure  Help needed turning from your back to your side while in a flat bed without using bedrails?: A Little Help needed moving from lying on your back to sitting on the side of a flat bed without using bedrails?: A Little Help needed moving to and from a bed to a chair (including a wheelchair)?: A Little Help needed standing up from a chair using your arms (e.g., wheelchair or bedside chair)?: A Little Help needed to walk in hospital room?: A Little Help needed climbing 3-5 steps with a railing? : A Little 6 Click Score: 18    End of Session Equipment Utilized During Treatment: Gait belt Activity Tolerance: Patient tolerated treatment well Patient left: in chair;with call bell/phone within reach;with chair alarm set   PT Visit Diagnosis: Other abnormalities of gait and mobility (R26.89);Difficulty in walking, not elsewhere classified (R26.2)     Time: 0630-1601 PT Time Calculation (min) (ACUTE ONLY): 21 min  Charges:  $Gait Training: 8-22 mins                     Baxter Flattery, PT  Acute Rehab Dept ([place]) [phone] Pager [phone]  08/25/2021    [facility] 08/25/2021, 2:35 PM

## 2021-08-25 NOTE — Progress Notes (Signed)
° °  Subjective: 1 Day Post-Op Procedure(s) (LRB): TOTAL KNEE ARTHROPLASTY (Right) Patient reports pain as mild.   Patient seen in rounds by Dr. Wynelle Link. Patient is well, and has had no acute complaints or problems No issues overnight. Denies chest pain, SOB, or calf pain. Foley catheter removed this AM.  We will continue therapy today, ambulated 20' yesterday.   Objective: Vital signs in last 24 hours: Temp:  [97.6 F (36.4 C)-98 F (36.7 C)] 97.8 F (36.6 C) (02/28 0552) Pulse Rate:  [69-103] 72 (02/28 0552) Resp:  [11-22] 16 (02/28 0552) BP: (120-156)/(56-95) 120/56 (02/28 0552) SpO2:  [94 %-100 %] 98 % (02/28 0552)  Intake/Output from previous day:  Intake/Output Summary (Last 24 hours) at 08/25/2021 0744 Last data filed at 08/25/2021 5809 Gross per 24 hour  Intake 3878.14 ml  Output 2855 ml  Net 1023.14 ml     Intake/Output this shift: No intake/output data recorded.  Labs: Recent Labs    08/25/21 0315  HGB 10.5*   Recent Labs    08/25/21 0315  WBC 10.7*  RBC 3.52*  HCT 32.2*  PLT 211   Recent Labs    08/25/21 0315  NA 136  K 4.8  CL 106  CO2 24  BUN 16  CREATININE 0.94  GLUCOSE 164*  CALCIUM 9.3   No results for input(s): LABPT, INR in the last 72 hours.  Exam: General - Patient is Alert and Oriented Extremity - Neurologically intact Neurovascular intact Sensation intact distally Dorsiflexion/Plantar flexion intact Dressing - dressing C/D/I Motor Function - intact, moving foot and toes well on exam.   Past Medical History:  Diagnosis Date   Anemia    Anxiety    Depression    Heart murmur    Hyperlipidemia    Hypertension    Hypothyroidism    Insomnia, unspecified    Pneumonia    Spondylosis of unspecified site without mention of myelopathy    Thyroid disease    hypothyroidism    Assessment/Plan: 1 Day Post-Op Procedure(s) (LRB): TOTAL KNEE ARTHROPLASTY (Right) Principal Problem:   Osteoarthritis of right knee Active  Problems:   Primary osteoarthritis of right knee  Estimated body mass index is 28.1 kg/m as calculated from the following:   Height as of this encounter: 5\' 8"  (1.727 m).   Weight as of this encounter: 83.8 kg. Advance diet Up with therapy D/C IV fluids   Patient's anticipated LOS is less than 2 midnights, meeting these requirements: - Lives within 1 hour of care - Has a competent adult at home to recover with post-op recover - NO history of  - Chronic pain requiring opioids  - Diabetes  - Coronary Artery Disease  - Heart failure  - Heart attack  - Stroke  - DVT/VTE  - Cardiac arrhythmia  - Respiratory Failure/COPD  - Renal failure  - Anemia  - Advanced Liver disease   DVT Prophylaxis - Aspirin Weight bearing as tolerated. Continue therapy.  Plan is to go Home after hospital stay. Possible discharge later today if progresses with therapy and meeting goals. Patient is 61, so if uncomfortable with discharge later today will stay additional night.  Scheduled for OPPT at [facility]). Follow-up in the office in 2 weeks.  The PDMP database was reviewed today prior to any opioid medications being prescribed to this patient.  Theresa Duty, PA-C Orthopedic Surgery [phone] 08/25/2021, 7:44 AM

## 2021-08-25 NOTE — TOC Transition Note (Signed)
Transition of Care [facility]) - CM/SW Discharge Note   Patient Details  Name: Brenda Solis MRN: [id number] Date of Birth: 04/23/1935  Transition of Care [facility]) CM/SW Contact:  Lennart Pall, LCSW Phone Number: 08/25/2021, 11:08 AM   Clinical Narrative:     Met with pt and confirming receipt of rw via [place].  Plan OPPT at [facility].  No TOC needs.  Final next level of care: OP Rehab Barriers to Discharge: No Barriers Identified   Patient Goals and CMS Choice Patient states their goals for this hospitalization and ongoing recovery are:: return home      Discharge Placement                       Discharge Plan and Services                DME Arranged: Walker rolling DME Agency: [place]                  Social Determinants of Health (SDOH) Interventions     Readmission Risk Interventions No flowsheet data found.

## 2021-08-27 ENCOUNTER — Encounter: Payer: Self-pay | Admitting: Physical Therapy

## 2021-08-27 ENCOUNTER — Ambulatory Visit: Payer: Medicare Other | Attending: Orthopedic Surgery | Admitting: Physical Therapy

## 2021-08-27 ENCOUNTER — Other Ambulatory Visit: Payer: Self-pay

## 2021-08-27 DIAGNOSIS — G8929 Other chronic pain: Secondary | ICD-10-CM | POA: Insufficient documentation

## 2021-08-27 DIAGNOSIS — M25561 Pain in right knee: Secondary | ICD-10-CM | POA: Diagnosis not present

## 2021-08-27 DIAGNOSIS — M6281 Muscle weakness (generalized): Secondary | ICD-10-CM | POA: Diagnosis not present

## 2021-08-27 DIAGNOSIS — R600 Localized edema: Secondary | ICD-10-CM | POA: Diagnosis not present

## 2021-08-27 DIAGNOSIS — M25661 Stiffness of right knee, not elsewhere classified: Secondary | ICD-10-CM | POA: Insufficient documentation

## 2021-08-27 NOTE — Discharge Summary (Signed)
Patient ID: Brenda Solis MRN: [id number] DOB/AGE: December 06, 1934 87 y.o.  Admit date: 08/24/2021 Discharge date: 08/25/2021  Admission Diagnoses:  Principal Problem:   Osteoarthritis of right knee Active Problems:   Primary osteoarthritis of right knee   Discharge Diagnoses:  Same  Past Medical History:  Diagnosis Date   Anemia    Anxiety    Depression    Heart murmur    Hyperlipidemia    Hypertension    Hypothyroidism    Insomnia, unspecified    Pneumonia    Spondylosis of unspecified site without mention of myelopathy    Thyroid disease    hypothyroidism    Surgeries: Procedure(s): TOTAL KNEE ARTHROPLASTY on 08/24/2021   Consultants:   Discharged Condition: Improved  Hospital Course: Brenda Solis is an 87 y.o. female who was admitted 08/24/2021 for operative treatment ofOsteoarthritis of right knee. Patient has severe unremitting pain that affects sleep, daily activities, and work/hobbies. After pre-op clearance the patient was taken to the operating room on 08/24/2021 and underwent  Procedure(s): TOTAL KNEE ARTHROPLASTY.    Patient was given perioperative antibiotics:  Anti-infectives (From admission, onward)    Start     Dose/Rate Route Frequency Ordered Stop   08/24/21 1430  ceFAZolin (ANCEF) IVPB 2g/100 mL premix        2 g 200 mL/hr over 30 Minutes Intravenous Every 6 hours 08/24/21 1342 08/24/21 2049   08/24/21 0600  ceFAZolin (ANCEF) IVPB 2g/100 mL premix        2 g 200 mL/hr over 30 Minutes Intravenous On call to O.R. 08/24/21 0518 08/24/21 0817        Patient was given sequential compression devices, early ambulation, and chemoprophylaxis to prevent DVT.  Patient benefited maximally from hospital stay and there were no complications.    Recent vital signs: No data found.   Recent laboratory studies:  Recent Labs    08/25/21 0315  WBC 10.7*  HGB 10.5*  HCT 32.2*  PLT 211  NA 136  K 4.8  CL 106  CO2 24  BUN 16  CREATININE 0.94  GLUCOSE  164*  CALCIUM 9.3     Discharge Medications:   Allergies as of 08/25/2021       Reactions   Gabapentin Other (See Comments)   Dizzy   Oxycodone Itching        Medication List     STOP taking these medications    traZODone 50 MG tablet Commonly known as: DESYREL       TAKE these medications    aspirin 325 MG EC tablet Take 1 tablet (325 mg total) by mouth 2 (two) times daily for 20 days. Then take one 81 mg aspirin once a day for three weeks. Then discontinue aspirin.   buPROPion 300 MG 24 hr tablet Commonly known as: WELLBUTRIN XL Take 1 tablet (300 mg total) by mouth daily.   DULoxetine 30 MG capsule Commonly known as: CYMBALTA Take 1 capsule (30 mg total) by mouth daily.   HYDROmorphone 2 MG tablet Commonly known as: DILAUDID Take 1-2 tablets (2-4 mg total) by mouth every 6 (six) hours as needed for severe pain.   levothyroxine 75 MCG tablet Commonly known as: SYNTHROID TAKE 1 TABLET BY MOUTH EVERY DAY BEFORE BREAKFAST   meclizine 25 MG tablet Commonly known as: ANTIVERT TAKE ONE TABLET BY MOUTH THREE TIMES DAILY AS NEEDED FOR DIZZINESS   methocarbamol 500 MG tablet Commonly known as: ROBAXIN Take 1 tablet (500 mg total) by mouth every  6 (six) hours as needed for muscle spasms. What changed: when to take this   multivitamin with minerals Tabs tablet Take 1 tablet by mouth daily.   nystatin cream Commonly known as: MYCOSTATIN Apply 1 application topically 2 (two) times daily as needed (rash).   VITAMIN D3 PO Take 1 tablet by mouth daily.               Discharge Care Instructions  (From admission, onward)           Start     Ordered   08/25/21 0000  Weight bearing as tolerated        08/25/21 0747   08/25/21 0000  Change dressing       Comments: You may remove the bulky bandage (ACE wrap and gauze) two days after surgery. You will have an adhesive waterproof bandage underneath. Leave this in place until your first follow-up  appointment.   08/25/21 0747            Diagnostic Studies: No results found.  Disposition: Discharge disposition: 01-Home or Self Care       Discharge Instructions     Call MD / Call 911   Complete by: As directed    If you experience chest pain or shortness of breath, CALL 911 and be transported to the hospital emergency room.  If you develope a fever above 101 F, pus (white drainage) or increased drainage or redness at the wound, or calf pain, call your surgeon's office.   Change dressing   Complete by: As directed    You may remove the bulky bandage (ACE wrap and gauze) two days after surgery. You will have an adhesive waterproof bandage underneath. Leave this in place until your first follow-up appointment.   Constipation Prevention   Complete by: As directed    Drink plenty of fluids.  Prune juice may be helpful.  You may use a stool softener, such as Colace (over the counter) 100 mg twice a day.  Use MiraLax (over the counter) for constipation as needed.   Diet - low sodium heart healthy   Complete by: As directed    Do not put a pillow under the knee. Place it under the heel.   Complete by: As directed    Driving restrictions   Complete by: As directed    No driving for two weeks   Post-operative opioid taper instructions:   Complete by: As directed    POST-OPERATIVE OPIOID TAPER INSTRUCTIONS: It is important to wean off of your opioid medication as soon as possible. If you do not need pain medication after your surgery it is ok to stop day one. Opioids include: Codeine, Hydrocodone(Norco, Vicodin), Oxycodone(Percocet, oxycontin) and hydromorphone amongst others.  Long term and even short term use of opiods can cause: Increased pain response Dependence Constipation Depression Respiratory depression And more.  Withdrawal symptoms can include Flu like symptoms Nausea, vomiting And more Techniques to manage these symptoms Hydrate well Eat regular healthy  meals Stay active Use relaxation techniques(deep breathing, meditating, yoga) Do Not substitute Alcohol to help with tapering If you have been on opioids for less than two weeks and do not have pain than it is ok to stop all together.  Plan to wean off of opioids This plan should start within one week post op of your joint replacement. Maintain the same interval or time between taking each dose and first decrease the dose.  Cut the total daily intake of opioids by one  tablet each day Next start to increase the time between doses. The last dose that should be eliminated is the evening dose.      TED hose   Complete by: As directed    Use stockings (TED hose) for three weeks on both leg(s).  You may remove them at night for sleeping.   Weight bearing as tolerated   Complete by: As directed         Follow-up Information     Gaynelle Arabian, MD. Go on 09/08/2021.   Specialty: Orthopedic Surgery Why: You are scheduled for first post op appointment on Tuesday March 14th at 5:15pm. Contact information: [address] [place] [address] [phone]                  Signed: Theresa Duty 08/27/2021, 7:06 AM

## 2021-08-27 NOTE — Therapy (Signed)
[facility] [place], [place], [address] Phone: [phone]   Fax:  [phone]  Physical Therapy Evaluation  Patient Details  Name: Brenda Solis MRN: [id number] Date of Birth: May 10, 1935 Referring Provider (PT): Gaynelle Arabian MD   Encounter Date: 08/27/2021   PT End of Session - 08/27/21 1528     Visit Number 1    Number of Visits 8    Date for PT Re-Evaluation 09/24/21    PT Start Time 0313    PT Stop Time 0400    PT Time Calculation (min) 47 min    Activity Tolerance Patient tolerated treatment well    Behavior During Therapy [facility] for tasks assessed/performed             Past Medical History:  Diagnosis Date   Anemia    Anxiety    Depression    Heart murmur    Hyperlipidemia    Hypertension    Hypothyroidism    Insomnia, unspecified    Pneumonia    Spondylosis of unspecified site without mention of myelopathy    Thyroid disease    hypothyroidism    Past Surgical History:  Procedure Laterality Date   APPENDECTOMY     CATARACT EXTRACTION, BILATERAL Bilateral 2006   [place] and [place] LEVEL 1 N/A 02/01/2013   Procedure: CENTRAL DECOMPRESSIVE LUMBAR LAMINECTOMY LEVEL 1 L4-L5;  Surgeon: Tobi Bastos, MD;  Location: [facility];  Service: Orthopedics;  Laterality: N/A;   DECOMPRESSIVE LUMBAR LAMINECTOMY LEVEL 1 N/A 11/20/2014   Procedure: REVISION L4-S1 DECOMPRESSION, L4-L5 IN SITU FUSION (LEVEL 1);  Surgeon: Melina Schools, MD;  Location: [facility];  Service: Orthopedics;  Laterality: N/A;   KNEE SURGERY Right 2009   meniscus repair   SPINAL CORD STIMULATOR INSERTION N/A 06/25/2020   Procedure: SPINAL CORD STIMULATOR INSERTION;  Surgeon: Melina Schools, MD;  Location: [facility];  Service: Orthopedics;  Laterality: N/A;  3 hrs   TOTAL HIP ARTHROPLASTY  2011   RIGHT   TOTAL KNEE ARTHROPLASTY Right 08/24/2021    Procedure: TOTAL KNEE ARTHROPLASTY;  Surgeon: Gaynelle Arabian, MD;  Location: [facility];  Service: Orthopedics;  Laterality: Right;    There were no vitals filed for this visit.    Subjective Assessment - 08/27/21 1540     Subjective COVID-19 screen performed prior to patient entering clinic.  The patient presents to the clinic today s/p righ total knee replacement performed on 08/24/21.  Her pain-level is rated at an 8/10.  She was prescribed a HEP at the hospital and was encouraged to be compliant to them.  She is walking with a FWW.  She is compliant to wearing her TED hoses.   Sitting with her leg elevated decreases pain and being up increases her pain.    Pertinent History HTN, hypothyroidism, cervical and lumbar surgery, OP (per patient).    How long can you stand comfortably? ~5 minutes.    How long can you walk comfortably? Short distance around home with a FWW.    Patient Stated Goals Get better with less knee pain.    Currently in Pain? Yes    Pain Score 8     Pain Location Knee    Pain Orientation Right    Pain Descriptors / Indicators Sore;Throbbing    Pain Type Surgical pain    Pain Onset More than a month ago  Pain Frequency Constant    Aggravating Factors  See above.    Pain Relieving Factors See above.                [facility] PT Assessment - 08/27/21 0001       Assessment   Medical Diagnosis Right total knee replacement    Referring Provider (PT) Gaynelle Arabian MD    Onset Date/Surgical Date 08/24/21      Precautions   Precaution Comments No ultrasound.      Restrictions   Weight Bearing Restrictions No      Balance Screen   Has the patient fallen in the past 6 months Yes    How many times? 1.  About three weeks ago.    Has the patient had a decrease in activity level because of a fear of falling?  Yes    Is the patient reluctant to leave their home because of a fear of falling?  No      Home Environment   Living Environment Private residence      Prior  Function   Level of Independence Independent with household mobility with device      Observation/Other Assessments   Observations Aquacel intact over right knee and TED hoses donned.    Focus on Therapeutic Outcomes (FOTO)  Complete.      Observation/Other Assessments-Edema    Edema Circumferential      Circumferential Edema   Circumferential - Right RT 6 cms > LT.      ROM / Strength   AROM / PROM / Strength AROM;Strength      AROM   Overall AROM Comments Right knee active extension is -15 degrees and passive to -10 degrees with active flexion to 80 degrees and passive to 85 degrees.      Strength   Overall Strength Comments Right hip flexion is 4-/5 and right knee extension is 4-/5.      Palpation   Palpation comment C/o diffuse right knee pain currently.      Ambulation/Gait   Gait Comments The patient was walking with antalgia and a step-to gait pattern.                        Objective measurements completed on examination: See above findings.       [facility] Adult PT Treatment/Exercise - 08/27/21 0001       Exercises   Exercises Knee/Hip      Knee/Hip Exercises: Aerobic   Nustep Level 1 x 8 minutes moving seat forward x 1 to increase flexion.      Vasopneumatic   Number Minutes Vasopneumatic  15 minutes    Vasopnuematic Location  --   Left knee.   Vasopneumatic Pressure Low                          PT Long Term Goals - 08/27/21 1802       PT LONG TERM GOAL #1   Title Independent with a HEP.    Time 4    Period Weeks    Status New      PT LONG TERM GOAL #2   Title Full active right knee extension in order to normalize gait.    Time 4    Period Weeks    Status New      PT LONG TERM GOAL #3   Title Active knee flexion to 115 degrees+ so the patient can perform  functional tasks and do so with pain not > 2-3/10.    Time 4    Period Weeks    Status New      PT LONG TERM GOAL #4   Title Increase right hip and  knee  strength to a solid 4+/5 to provide good stability for accomplishment of functional activities.    Time 4    Period Weeks    Status New      PT LONG TERM GOAL #5   Title Perform a reciprocating stair gait with one railing with pain not > 2-3/10.    Time 4    Period Weeks    Status New                    Plan - 08/27/21 1529     Clinical Impression Statement The patient presents to OPPT s/p right total knee replacement performed on 08/24/21.  Patient is currently c/o a lot of pain.  She is walking with a FWW with a step-to gait pattern.  She lacks some flexion and extension currently and exhibits moderate+ edema.  She exhibits an antigravity SLR and SAQ.  Patient is motivated to improve and I enciouraged her to be compliant to her HEP. Patient will benefit from skilled physical therapy intervention to address pain and deficits.    Personal Factors and Comorbidities Comorbidity 1;Other    Comorbidities HTN, hypothyroidism, cervical and lumbar surgery, OP (per patient).    Examination-Activity Limitations Other;Dressing;Bathing;Locomotion Level;Stand    Examination-Participation Restrictions Other    Stability/Clinical Decision Making Stable/Uncomplicated    Rehab Potential Excellent    PT Frequency 2x / week   Patient requesting twice a week due to transportation issues.   PT Duration 4 weeks    PT Treatment/Interventions ADLs/Self Care Home Management;Cryotherapy;Electrical Stimulation;Moist Heat;Gait training;Stair training;Functional mobility training;Therapeutic activities;Therapeutic exercise;Neuromuscular re-education;Manual techniques;Patient/family education;Passive range of motion;Vasopneumatic Device    PT Next Visit Plan Nustep, PROM, progress to recumbent bike.  Progress into TKA protocol.  Vasopneumatic.    Consulted and Agree with Plan of Care Patient             Patient will benefit from skilled therapeutic intervention in order to improve the following  deficits and impairments:  Abnormal gait, Difficulty walking, Decreased activity tolerance, Decreased strength, Decreased range of motion, Increased edema, Pain  Visit Diagnosis: Chronic pain of right knee - Plan: PT plan of care cert/re-cert  Stiffness of right knee, not elsewhere classified - Plan: PT plan of care cert/re-cert  Localized edema - Plan: PT plan of care cert/re-cert  Muscle weakness (generalized) - Plan: PT plan of care cert/re-cert     Problem List Patient Active Problem List   Diagnosis Date Noted   Primary osteoarthritis of right knee 08/24/2021   Cough 07/06/2021   Osteoarthritis of right knee 05/18/2021   Chronic pain 06/25/2020   Obsessional thoughts    Mood disorder ([place])    Osteopenia 04/24/2014   Hyperlipidemia with target LDL less than 100 02/01/2014   Depression 02/01/2014   Spinal stenosis, lumbar region, with neurogenic claudication 02/01/2013   Hypertension 11/17/2010   Hypothyroidism 10/14/2010   Insomnia 10/14/2010   Gastritis 10/14/2010    Eliana Lueth, [place], PT 08/27/2021, 6:11 PM  [facility] [address] [place], [place], [address] Phone: [phone]   Fax:  [phone]  Name: Brenda Solis MRN: [id number] Date of Birth: Mar 19, 1935

## 2021-08-31 ENCOUNTER — Ambulatory Visit (INDEPENDENT_AMBULATORY_CARE_PROVIDER_SITE_OTHER): Payer: Medicare Other

## 2021-08-31 VITALS — Wt 185.0 lb

## 2021-08-31 DIAGNOSIS — Z780 Asymptomatic menopausal state: Secondary | ICD-10-CM

## 2021-08-31 DIAGNOSIS — Z0000 Encounter for general adult medical examination without abnormal findings: Secondary | ICD-10-CM | POA: Diagnosis not present

## 2021-08-31 NOTE — Progress Notes (Signed)
Subjective:   Brenda JACQUEZ is a 87 y.o. female who presents for Medicare Annual (Subsequent) preventive examination.  Virtual Visit via Telephone Note  I connected with  [name] on 08/31/21 at  9:00 AM EST by telephone and verified that I am speaking with the correct person using two identifiers.  Location: Patient: Home Provider: [facility] Persons participating in the virtual visit: patient/Nurse Health Advisor   I discussed the limitations, risks, security and privacy concerns of performing an evaluation and management service by telephone and the availability of in person appointments. The patient expressed understanding and agreed to proceed.  Interactive audio and video telecommunications were attempted between this nurse and patient, however failed, due to patient having technical difficulties OR patient did not have access to video capability.  We continued and completed visit with audio only.  Some vital signs may be absent or patient reported.   Aishah Teffeteller E Eliabeth Shoff, LPN   Review of Systems     Cardiac Risk Factors include: advanced age (>70mn, >>77women);sedentary lifestyle;dyslipidemia;hypertension     Objective:    Today's Vitals   08/31/21 0904  Weight: 185 lb (83.9 kg)  PainSc: 4    Body mass index is 28.13 kg/m.  Advanced Directives 08/31/2021 08/24/2021 08/11/2021 05/29/2021 08/29/2020 06/29/2020 06/25/2020  Does Patient Have a Medical Advance Directive? No No No No No No No  Would patient like information on creating a medical advance directive? No - Patient declined No - Guardian declined No - Patient declined - No - Patient declined No - Patient declined No - Patient declined  Pre-existing out of facility DNR order (yellow form or pink MOST form) - - - - - - -    Current Medications (verified) Outpatient Encounter Medications as of 08/31/2021  Medication Sig   aspirin EC 325 MG EC tablet Take 1 tablet (325 mg total) by mouth 2 (two) times daily for 20 days. Then  take one 81 mg aspirin once a day for three weeks. Then discontinue aspirin.   buPROPion (WELLBUTRIN XL) 300 MG 24 hr tablet Take 1 tablet (300 mg total) by mouth daily.   Cholecalciferol (VITAMIN D3 PO) Take 1 tablet by mouth daily.   DULoxetine (CYMBALTA) 30 MG capsule Take 1 capsule (30 mg total) by mouth daily.   HYDROmorphone (DILAUDID) 2 MG tablet Take 1-2 tablets (2-4 mg total) by mouth every 6 (six) hours as needed for severe pain.   levothyroxine (SYNTHROID) 75 MCG tablet TAKE 1 TABLET BY MOUTH EVERY DAY BEFORE BREAKFAST   methocarbamol (ROBAXIN) 500 MG tablet Take 1 tablet (500 mg total) by mouth every 6 (six) hours as needed for muscle spasms.   Multiple Vitamin (MULTIVITAMIN WITH MINERALS) TABS tablet Take 1 tablet by mouth daily.   nystatin cream (MYCOSTATIN) Apply 1 application topically 2 (two) times daily as needed (rash).   meclizine (ANTIVERT) 25 MG tablet TAKE ONE TABLET BY MOUTH THREE TIMES DAILY AS NEEDED FOR DIZZINESS (Patient not taking: Reported on 08/31/2021)   No facility-administered encounter medications on file as of 08/31/2021.    Allergies (verified) Gabapentin and Oxycodone   History: Past Medical History:  Diagnosis Date   Anemia    Anxiety    Depression    Heart murmur    Hyperlipidemia    Hypertension    Hypothyroidism    Insomnia, unspecified    Pneumonia    Spondylosis of unspecified site without mention of myelopathy    Thyroid disease    hypothyroidism  Past Surgical History:  Procedure Laterality Date   APPENDECTOMY     CATARACT EXTRACTION, BILATERAL Bilateral 2006   [place] and [place]   DECOMPRESSIVE LUMBAR LAMINECTOMY LEVEL 1 N/A 02/01/2013   Procedure: CENTRAL DECOMPRESSIVE LUMBAR LAMINECTOMY LEVEL 1 L4-L5;  Surgeon: Tobi Bastos, MD;  [facility];  Service: Orthopedics;  Laterality: N/A;   DECOMPRESSIVE LUMBAR LAMINECTOMY LEVEL 1 N/A 11/20/2014   Procedure:  REVISION L4-S1 DECOMPRESSION, L4-L5 IN SITU FUSION (LEVEL 1);  Surgeon: Melina Schools, MD;  Location: [facility];  Service: Orthopedics;  Laterality: N/A;   KNEE SURGERY Right 2009   meniscus repair   SPINAL CORD STIMULATOR INSERTION N/A 06/25/2020   Procedure: SPINAL CORD STIMULATOR INSERTION;  Surgeon: Melina Schools, MD;  Location: [facility];  Service: Orthopedics;  Laterality: N/A;  3 hrs   TOTAL HIP ARTHROPLASTY  2011   RIGHT   TOTAL KNEE ARTHROPLASTY Right 08/24/2021   Procedure: TOTAL KNEE ARTHROPLASTY;  Surgeon: Gaynelle Arabian, MD;  [facility];  Service: Orthopedics;  Laterality: Right;   Family History  Problem Relation Age of Onset   Coronary artery disease Brother        52s   Heart disease Brother    Cancer Mother        breast   Osteoporosis Mother    Heart disease Mother    Osteoporosis Maternal Aunt    Heart disease Father    Stroke Father    Esophageal varices Brother    Stroke Other        FAMILY   Esophageal varices Other        FAMILY   Social History   Socioeconomic History   Marital status: Widowed    Spouse name: Not on file   Number of children: 3   Years of education: Not on file   Highest education level: 12th grade  Occupational History    Employer: RETIRED    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  Tobacco Use   Smoking status: Never   Smokeless tobacco: Never  Vaping Use   Vaping Use: Never used  Substance and Sexual Activity   Alcohol use: No    Alcohol/week: 0.0 standard drinks   Drug use: No   Sexual activity: Not Currently  Other Topics Concern   Not on file  Social History Narrative   Not on file   Social Determinants of Health   Financial Resource Strain: Low Risk    Difficulty of Paying Living Expenses: Not very hard  Food Insecurity: No Food Insecurity   Worried About Running Out of Food in the Last Year: Never true   Ran Out of Food in the Last Year: Never true  Transportation Needs: No Transportation Needs   Lack of  Transportation (Medical): No   Lack of Transportation (Non-Medical): No  Physical Activity: Insufficiently Active   Days of Exercise per Week: 2 days   Minutes of Exercise per Session: 60 min  Stress: No Stress Concern Present   Feeling of Stress : Not at all  Social Connections: Moderately Integrated   Frequency of Communication with Friends and Family: More than three times a week   Frequency of Social Gatherings with Friends and Family: More than three times a week   Attends Religious Services: More than 4 times per year   Active Member of [employer] or Organizations: Yes   Attends Archivist Meetings: More than 4  times per year   Marital Status: Widowed    Tobacco Counseling Counseling given: Not Answered   Clinical Intake:  Pre-visit preparation completed: Yes  Pain : 0-10 Pain Score: 4  Pain Type: Chronic pain Pain Location: Knee Pain Orientation: Right Pain Descriptors / Indicators: Aching, Sore Pain Onset: 1 to 4 weeks ago Pain Frequency: Intermittent     BMI - recorded: 28.13 Nutritional Status: BMI 25 -29 Overweight Nutritional Risks: None Diabetes: No  How often do you need to have someone help you when you read instructions, pamphlets, or other written materials from your doctor or pharmacy?: 1 - Never  Diabetic? no  Interpreter Needed?: No  Information entered by :: Dominigue Gellner, LPN   Activities of Daily Living In your present state of health, do you have any difficulty performing the following activities: 08/31/2021 08/24/2021  Hearing? Y [place]? N N  Difficulty concentrating or making decisions? N N  Walking or climbing stairs? Y N  Comment - -  Dressing or bathing? N N  Doing errands, shopping? Y N  Comment just for now - recent knee surgery -  Preparing Food and eating ? N -  Using the Toilet? N -  In the past six months, have you accidently leaked urine? N -  Do you have problems with loss of bowel control? N -   Managing your Medications? N -  Managing your Finances? N -  Housekeeping or managing your Housekeeping? Y -  Comment just for now - recent knee surgery -  Some recent data might be hidden    Patient Care Team: [name], DO as PCP - General (Family Medicine) Gaynelle Arabian, MD as Consulting Physician (Orthopedic Surgery) Celestia Khat, OD (Optometry)  Indicate any recent Medical Services you may have received from other than [facility] providers in the past year (date may be approximate).     Assessment:   This is a routine wellness examination for [place].  Hearing/Vision screen Hearing Screening - Comments:: C/o moderate hearing difficulties - may look into hearing aids soon Vision Screening - Comments:: Wears rx glasses - up to date with routine eye exams with [facility]  Dietary issues and exercise activities discussed: Current Exercise Habits: Structured exercise class, Type of exercise: strength training/weights;stretching;walking, Time (Minutes): 60, Frequency (Times/Week): 2, Weekly Exercise (Minutes/Week): 120, Intensity: Moderate, Exercise limited by: orthopedic condition(s)   Goals Addressed             This Visit's Progress    DIET - INCREASE WATER INTAKE   On track    Exercise 150 min/wk Moderate Activity   On track      Depression Screen PHQ 2/9 Scores 08/31/2021 08/03/2021 07/24/2021 06/04/2021 04/20/2021 12/09/2020 08/29/2020  PHQ - 2 Score 0 0 0 0 0 0 1  PHQ- 9 Score 0 0 0 0 - - 2    Fall Risk Fall Risk  08/31/2021 08/03/2021 07/24/2021 06/04/2021 04/20/2021  Falls in the past year? '1 1 1 '$ 0 0  Number falls in past yr: 0 0 0 - -  Comment - - - - -  Injury with Fall? 0 0 0 - -  Risk Factor Category  - - - - -  Risk for fall due to : History of fall(s);Impaired balance/gait;Orthopedic patient;Medication side effect History of fall(s) - - -  Risk for fall due to: Comment - - - - -  Follow up Education provided;Falls prevention discussed Education provided -  - -  FALL RISK PREVENTION PERTAINING TO THE HOME:  Any stairs in or around the home? Yes  If so, are there any without handrails? No  Home free of loose throw rugs in walkways, pet beds, electrical cords, etc? Yes  Adequate lighting in your home to reduce risk of falls? Yes   ASSISTIVE DEVICES UTILIZED TO PREVENT FALLS:  Life alert? No  Use of a cane, walker or w/c? Yes  Grab bars in the bathroom? Yes  Shower chair or bench in shower? Yes  Elevated toilet seat or a handicapped toilet? Yes   TIMED UP AND GO:  Was the test performed? No .  Telephonic visit  Cognitive Function: Normal cognitive status assessed by direct observation by this Nurse Health Advisor. No abnormalities found.    MMSE - Mini Mental State Exam 03/03/2018 10/17/2014  Orientation to time 5 5  Orientation to Place - 5  Registration 3 3  Attention/ Calculation 4 5  Recall 2 3  Language- name 2 objects 2 2  Language- repeat 1 1  Language- follow 3 step command 3 3  Language- read & follow direction 1 1  Write a sentence 1 1  Copy design 0 1  Total score - 30     6CIT Screen 08/29/2020 08/21/2019  What Year? 0 points 0 points  What month? 0 points 0 points  What time? 0 points 0 points  Count back from 20 0 points 0 points  Months in reverse 0 points 2 points  Repeat phrase 0 points 2 points  Total Score 0 4    Immunizations Immunization History  Administered Date(s) Administered   Fluad Quad(high Dose 65+) 05/04/2019, 05/27/2020, 04/20/2021   Influenza, High Dose Seasonal PF 05/06/2016, 05/06/2016, 04/14/2017, 04/14/2017, 03/30/2018, 03/30/2018   Influenza,inj,Quad PF,6+ Mos 03/28/2013, 04/24/2014, 06/20/2015   Moderna Sars-Covid-2 Vaccination 08/07/2019, 09/04/2019, 07/09/2020   Pneumococcal Conjugate-13 10/16/2014   Pneumococcal Polysaccharide-23 05/28/2006   Td 06/29/1995, 05/26/2011   Tdap 05/26/2011    TDAP status: Due, Education has been provided regarding the importance of this  vaccine. Advised may receive this vaccine at local pharmacy or Health Dept. Aware to provide a copy of the vaccination record if obtained from local pharmacy or Health Dept. Verbalized acceptance and understanding.  Flu Vaccine status: Up to date  Pneumococcal vaccine status: Up to date  Covid-19 vaccine status: Completed vaccines  Qualifies for Shingles Vaccine? Yes   Zostavax completed No   Shingrix Completed?: No.    Education has been provided regarding the importance of this vaccine. Patient has been advised to call insurance company to determine out of pocket expense if they have not yet received this vaccine. Advised may also receive vaccine at local pharmacy or Health Dept. Verbalized acceptance and understanding.  Screening Tests Health Maintenance  Topic Date Due   Zoster Vaccines- Shingrix (1 of 2) Never done   DEXA SCAN  04/24/2016   COVID-19 Vaccine (4 - Booster for Moderna series) 09/03/2020   TETANUS/TDAP  08/03/2022 (Originally 05/25/2021)   Pneumonia Vaccine 83+ Years old  Completed   INFLUENZA VACCINE  Completed   HPV VACCINES  Aged Out    Health Maintenance  Health Maintenance Due  Topic Date Due   Zoster Vaccines- Shingrix (1 of 2) Never done   DEXA SCAN  04/24/2016   COVID-19 Vaccine (4 - Booster for Moderna series) 09/03/2020    Colorectal cancer screening: No longer required.   Mammogram status: No longer required due to age.  Bone Density status: Ordered  08/31/21. Pt provided with contact info and advised to call to schedule appt.  Lung Cancer Screening: (Low Dose CT Chest recommended if Age 1-80 years, 30 pack-year currently smoking OR have quit w/in 15years.) does not qualify.   Additional Screening:  Hepatitis C Screening: does not qualify  Vision Screening: Recommended annual ophthalmology exams for early detection of glaucoma and other disorders of the eye. Is the patient up to date with their annual eye exam?  Yes  Who is the provider or  what is the name of the office in which the patient attends annual eye exams? [facility] If pt is not established with a provider, would they like to be referred to a provider to establish care? No .   Dental Screening: Recommended annual dental exams for proper oral hygiene  Community Resource Referral / Chronic Care Management: CRR required this visit?  No   CCM required this visit?  No      Plan:     I have personally reviewed and noted the following in the patients chart:   Medical and social history Use of alcohol, tobacco or illicit drugs  Current medications and supplements including opioid prescriptions.  Functional ability and status Nutritional status Physical activity Advanced directives List of other physicians Hospitalizations, surgeries, and ER visits in previous 12 months Vitals Screenings to include cognitive, depression, and falls Referrals and appointments  In addition, I have reviewed and discussed with patient certain preventive protocols, quality metrics, and best practice recommendations. A written personalized care plan for preventive services as well as general preventive health recommendations were provided to patient.     Sandrea Hammond, LPN   02/28/7341   Nurse Notes: She just had knee surgery last week - still in significant pain - Several answers don't reflect her baseline ADLs

## 2021-08-31 NOTE — Patient Instructions (Signed)
Brenda Solis , Thank you for taking time to come for your Medicare Wellness Visit. I appreciate your ongoing commitment to your health goals. Please review the following plan we discussed and let me know if I can assist you in the future.   Screening recommendations/referrals: Colonoscopy: No longer required Mammogram: No longer required Bone Density: Done 04/24/2014 - This may be repeated up to every 2 years - consider at your next visit Recommended yearly ophthalmology/optometry visit for glaucoma screening and checkup Recommended yearly dental visit for hygiene and checkup  Vaccinations: Influenza vaccine: Done 04/20/2021 - Repeat annually  Pneumococcal vaccine: Done 05/28/2006 & 10/16/2014 Tdap vaccine: Done 05/26/2011 - Repeat in 10 years *due Shingles vaccine: Due - recommend 2 doses 2-6 months apart - this is over 90% effective   Covid-19: Done 08/07/2019, 09/04/2019, & 07/09/2020 - for additional  boosters, contact pharmacy  Advanced directives: Advance directive discussed with you today. Even though you declined this today, please call our office should you change your mind, and we can give you the proper paperwork for you to fill out.   Conditions/risks identified: Continue Physical Therapy and try to be active on other days for at least 30 minutes, drink 6-8 glasses of water, and aim for 5 servings of fruits and vegetables each day.   Next appointment: Follow up in one year for your annual wellness visit    Preventive Care 65 Years and Older, Female Preventive care refers to lifestyle choices and visits with your health care provider that can promote health and wellness. What does preventive care include? A yearly physical exam. This is also called an annual well check. Dental exams once or twice a year. Routine eye exams. Ask your health care provider how often you should have your eyes checked. Personal lifestyle choices, including: Daily care of your teeth and gums. Regular  physical activity. Eating a healthy diet. Avoiding tobacco and drug use. Limiting alcohol use. Practicing safe sex. Taking low-dose aspirin every day. Taking vitamin and mineral supplements as recommended by your health care provider. What happens during an annual well check? The services and screenings done by your health care provider during your annual well check will depend on your age, overall health, lifestyle risk factors, and family history of disease. Counseling  Your health care provider may ask you questions about your: Alcohol use. Tobacco use. Drug use. Emotional well-being. Home and relationship well-being. Sexual activity. Eating habits. History of falls. Memory and ability to understand (cognition). Work and work Statistician. Reproductive health. Screening  You may have the following tests or measurements: Height, weight, and BMI. Blood pressure. Lipid and cholesterol levels. These may be checked every 5 years, or more frequently if you are over 62 years old. Skin check. Lung cancer screening. You may have this screening every year starting at age 60 if you have a 30-pack-year history of smoking and currently smoke or have quit within the past 15 years. Fecal occult blood test (FOBT) of the stool. You may have this test every year starting at age 45. Flexible sigmoidoscopy or colonoscopy. You may have a sigmoidoscopy every 5 years or a colonoscopy every 10 years starting at age 50. Hepatitis C blood test. Hepatitis B blood test. Sexually transmitted disease (STD) testing. Diabetes screening. This is done by checking your blood sugar (glucose) after you have not eaten for a while (fasting). You may have this done every 1-3 years. Bone density scan. This is done to screen for osteoporosis. You may have this  done starting at age 31. Mammogram. This may be done every 1-2 years. Talk to your health care provider about how often you should have regular mammograms. Talk  with your health care provider about your test results, treatment options, and if necessary, the need for more tests. Vaccines  Your health care provider may recommend certain vaccines, such as: Influenza vaccine. This is recommended every year. Tetanus, diphtheria, and acellular pertussis (Tdap, Td) vaccine. You may need a Td booster every 10 years. Zoster vaccine. You may need this after age 44. Pneumococcal 13-valent conjugate (PCV13) vaccine. One dose is recommended after age 76. Pneumococcal polysaccharide (PPSV23) vaccine. One dose is recommended after age 32. Talk to your health care provider about which screenings and vaccines you need and how often you need them. This information is not intended to replace advice given to you by your health care provider. Make sure you discuss any questions you have with your health care provider. Document Released: 07/11/2015 Document Revised: 03/03/2016 Document Reviewed: 04/15/2015 Elsevier Interactive Patient Education  2017 [employer] Prevention in the Home Falls can cause injuries. They can happen to people of all ages. There are many things you can do to make your home safe and to help prevent falls. What can I do on the outside of my home? Regularly fix the edges of walkways and driveways and fix any cracks. Remove anything that might make you trip as you walk through a door, such as a raised step or threshold. Trim any bushes or trees on the path to your home. Use bright outdoor lighting. Clear any walking paths of anything that might make someone trip, such as rocks or tools. Regularly check to see if handrails are loose or broken. Make sure that both sides of any steps have handrails. Any raised decks and porches should have guardrails on the edges. Have any leaves, snow, or ice cleared regularly. Use sand or salt on walking paths during winter. Clean up any spills in your garage right away. This includes oil or grease  spills. What can I do in the bathroom? Use night lights. Install grab bars by the toilet and in the tub and shower. Do not use towel bars as grab bars. Use non-skid mats or decals in the tub or shower. If you need to sit down in the shower, use a plastic, non-slip stool. Keep the floor dry. Clean up any water that spills on the floor as soon as it happens. Remove soap buildup in the tub or shower regularly. Attach bath mats securely with double-sided non-slip rug tape. Do not have throw rugs and other things on the floor that can make you trip. What can I do in the bedroom? Use night lights. Make sure that you have a light by your bed that is easy to reach. Do not use any sheets or blankets that are too big for your bed. They should not hang down onto the floor. Have a firm chair that has side arms. You can use this for support while you get dressed. Do not have throw rugs and other things on the floor that can make you trip. What can I do in the kitchen? Clean up any spills right away. Avoid walking on wet floors. Keep items that you use a lot in easy-to-reach places. If you need to reach something above you, use a strong step stool that has a grab bar. Keep electrical cords out of the way. Do not use floor polish or wax  that makes floors slippery. If you must use wax, use non-skid floor wax. Do not have throw rugs and other things on the floor that can make you trip. What can I do with my stairs? Do not leave any items on the stairs. Make sure that there are handrails on both sides of the stairs and use them. Fix handrails that are broken or loose. Make sure that handrails are as long as the stairways. Check any carpeting to make sure that it is firmly attached to the stairs. Fix any carpet that is loose or worn. Avoid having throw rugs at the top or bottom of the stairs. If you do have throw rugs, attach them to the floor with carpet tape. Make sure that you have a light switch at the  top of the stairs and the bottom of the stairs. If you do not have them, ask someone to add them for you. What else can I do to help prevent falls? Wear shoes that: Do not have high heels. Have rubber bottoms. Are comfortable and fit you well. Are closed at the toe. Do not wear sandals. If you use a stepladder: Make sure that it is fully opened. Do not climb a closed stepladder. Make sure that both sides of the stepladder are locked into place. Ask someone to hold it for you, if possible. Clearly mark and make sure that you can see: Any grab bars or handrails. First and last steps. Where the edge of each step is. Use tools that help you move around (mobility aids) if they are needed. These include: Canes. Walkers. Scooters. Crutches. Turn on the lights when you go into a dark area. Replace any light bulbs as soon as they burn out. Set up your furniture so you have a clear path. Avoid moving your furniture around. If any of your floors are uneven, fix them. If there are any pets around you, be aware of where they are. Review your medicines with your doctor. Some medicines can make you feel dizzy. This can increase your chance of falling. Ask your doctor what other things that you can do to help prevent falls. This information is not intended to replace advice given to you by your health care provider. Make sure you discuss any questions you have with your health care provider. Document Released: 04/10/2009 Document Revised: 11/20/2015 Document Reviewed: 07/19/2014 Elsevier Interactive Patient Education  2017 [employer].

## 2021-08-31 NOTE — Addendum Note (Signed)
Addended by: Adalberto Cole E on: 08/31/2021 09:28 AM ? ? Modules accepted: Orders ? ?

## 2021-09-01 ENCOUNTER — Ambulatory Visit: Payer: Medicare Other | Admitting: *Deleted

## 2021-09-01 ENCOUNTER — Other Ambulatory Visit: Payer: Self-pay

## 2021-09-01 DIAGNOSIS — G8929 Other chronic pain: Secondary | ICD-10-CM

## 2021-09-01 DIAGNOSIS — M6281 Muscle weakness (generalized): Secondary | ICD-10-CM | POA: Diagnosis not present

## 2021-09-01 DIAGNOSIS — R600 Localized edema: Secondary | ICD-10-CM | POA: Diagnosis not present

## 2021-09-01 DIAGNOSIS — M25661 Stiffness of right knee, not elsewhere classified: Secondary | ICD-10-CM

## 2021-09-01 DIAGNOSIS — M25561 Pain in right knee: Secondary | ICD-10-CM | POA: Diagnosis not present

## 2021-09-01 NOTE — Therapy (Signed)
[place] ?[facility] ?[address] ?[place], [place], [address] ?Phone: [phone]   Fax:  [phone] ? ?Physical Therapy Treatment ? ?Patient Details  ?Name: Brenda Solis ?MRN: [id number] ?Date of Birth: May 26, 1935 ?Referring Provider (PT): Gaynelle Arabian MD ? ? ?Encounter Date: 09/01/2021 ? ? PT End of Session - 09/01/21 1607   ? ? Visit Number 2   ? Number of Visits 8   ? Date for PT Re-Evaluation 09/24/21   ? PT Start Time 1600   ? PT Stop Time 2229   ? PT Time Calculation (min) 51 min   ? ?  ?  ? ?  ? ? ?Past Medical History:  ?Diagnosis Date  ? Anemia   ? Anxiety   ? Depression   ? Heart murmur   ? Hyperlipidemia   ? Hypertension   ? Hypothyroidism   ? Insomnia, unspecified   ? Pneumonia   ? Spondylosis of unspecified site without mention of myelopathy   ? Thyroid disease   ? hypothyroidism  ? ? ?Past Surgical History:  ?Procedure Laterality Date  ? APPENDECTOMY    ? CATARACT EXTRACTION, BILATERAL Bilateral 2006  ? [place] SURGERY  1999  ? [place]  ? CHOLECYSTECTOMY  1990  ? DECOMPRESSIVE LUMBAR LAMINECTOMY LEVEL 1 N/A 02/01/2013  ? Procedure: CENTRAL DECOMPRESSIVE LUMBAR LAMINECTOMY LEVEL 1 L4-L5;  Surgeon: Tobi Bastos, MD;  Location: [facility];  Service: Orthopedics;  Laterality: N/A;  ? DECOMPRESSIVE LUMBAR LAMINECTOMY LEVEL 1 N/A 11/20/2014  ? Procedure: REVISION L4-S1 DECOMPRESSION, L4-L5 IN SITU FUSION (LEVEL 1);  Surgeon: Melina Schools, MD;  Location: [place];  Service: Orthopedics;  Laterality: N/A;  ? KNEE SURGERY Right 2009  ? meniscus repair  ? SPINAL CORD STIMULATOR INSERTION N/A 06/25/2020  ? Procedure: SPINAL CORD STIMULATOR INSERTION;  Surgeon: Melina Schools, MD;  Location: [place];  Service: Orthopedics;  Laterality: N/A;  3 hrs  ? TOTAL HIP ARTHROPLASTY  2011  ? RIGHT  ? TOTAL KNEE ARTHROPLASTY Right 08/24/2021  ? Procedure: TOTAL KNEE ARTHROPLASTY;  Surgeon: Gaynelle Arabian, MD;  Location: [facility];  Service: Orthopedics;  Laterality:  Right;  ? ? ?There were no vitals filed for this visit. ? ? ? ? ? ? ? ? ? ? ? ? ? ? ? ? ? ? ? ? ? [facility] PT Treatment/Exercise - 09/01/21 0001   ? ?  ? Exercises  ? Exercises Knee/Hip   ?  ? Knee/Hip Exercises: Aerobic  ? Nustep Level 3 x  seat 11,10 x 15 mins minutes moving seat forward  to increase flexion.   ?  ? Knee/Hip Exercises: Seated  ? Long Arc Quad Right;3 sets;10 reps   ?  ? Knee/Hip Exercises: Supine  ? Quad Sets AROM;Right;2 sets;10 reps   ?  ? Modalities  ? Modalities Vasopneumatic   ?  ? Vasopneumatic  ? Number Minutes Vasopneumatic  15 minutes   ? Vasopnuematic Location  --   Left knee.  ? Vasopneumatic Pressure Low   ? Vasopneumatic Temperature  34   ?  ? Manual Therapy  ? Manual Therapy Passive ROM   ? Passive ROM AAROM for SLR 2x10, ABD 2x10   ? ?  ?  ? ?  ? ? ? ? ? ? ? ? ? ? ? ? ? ? ? PT Long Term Goals - 08/27/21 1802   ? ?  ? PT LONG TERM GOAL #1  ? Title Independent with  a HEP.   ? Time 4   ? Period Weeks   ? Status New   ?  ? PT LONG TERM GOAL #2  ? Title Full active right knee extension in order to normalize gait.   ? Time 4   ? Period Weeks   ? Status New   ?  ? PT LONG TERM GOAL #3  ? Title Active knee flexion to 115 degrees+ so the patient can perform functional tasks and do so with pain not > 2-3/10.   ? Time 4   ? Period Weeks   ? Status New   ?  ? PT LONG TERM GOAL #4  ? Title Increase right hip and  knee strength to a solid 4+/5 to provide good stability for accomplishment of functional activities.   ? Time 4   ? Period Weeks   ? Status New   ?  ? PT LONG TERM GOAL #5  ? Title Perform a reciprocating stair gait with one railing with pain not > 2-3/10.   ? Time 4   ? Period Weeks   ? Status New   ? ?  ?  ? ?  ? ? ? ? ? ? ? ? Plan - 09/01/21 1656   ? ? Clinical Impression Statement Pt arrived todaydoing fair with RT knee. She was able to perform therex for ROM and quad activation. AAROM for multiple sets of SLR and hip abduction was tolerated well. Surgical bandage starting to pull  away, so medical tape used to hold at top. MD f/u in 1 week.   ? Personal Factors and Comorbidities Comorbidity 1;Other   ? Comorbidities HTN, hypothyroidism, cervical and lumbar surgery, OP (per patient).   ? Examination-Activity Limitations Other;Dressing;Bathing;Locomotion Level;Stand   ? Stability/Clinical Decision Making Stable/Uncomplicated   ? Rehab Potential Excellent   ? PT Frequency 2x / week   ? PT Duration 4 weeks   ? PT Treatment/Interventions ADLs/Self Care Home Management;Cryotherapy;Electrical Stimulation;Moist Heat;Gait training;Stair training;Functional mobility training;Therapeutic activities;Therapeutic exercise;Neuromuscular re-education;Manual techniques;Patient/family education;Passive range of motion;Vasopneumatic Device   ? PT Next Visit Plan Nustep, PROM, progress to recumbent bike.  Progress into TKA protocol.  Vasopneumatic.   ? Consulted and Agree with Plan of Care Patient   ? ?  ?  ? ?  ? ? ?Patient will benefit from skilled therapeutic intervention in order to improve the following deficits and impairments:  Abnormal gait, Difficulty walking, Decreased activity tolerance, Decreased strength, Decreased range of motion, Increased edema, Pain ? ?Visit Diagnosis: ?Chronic pain of right knee ? ?Stiffness of right knee, not elsewhere classified ? ?Localized edema ? ? ? ? ?Problem List ?Patient Active Problem List  ? Diagnosis Date Noted  ? Primary osteoarthritis of right knee 08/24/2021  ? Cough 07/06/2021  ? Osteoarthritis of right knee 05/18/2021  ? Chronic pain 06/25/2020  ? Obsessional thoughts   ? Mood disorder ([place])   ? Osteopenia 04/24/2014  ? Hyperlipidemia with target LDL less than 100 02/01/2014  ? Depression 02/01/2014  ? Spinal stenosis, lumbar region, with neurogenic claudication 02/01/2013  ? Hypertension 11/17/2010  ? Hypothyroidism 10/14/2010  ? Insomnia 10/14/2010  ? Gastritis 10/14/2010  ? ? ?Brenda Solis,Brenda Solis, Brenda Solis ?09/01/2021, 5:05 PM ? ?[place] ?[facility] ?[address] ?[place], [place], [address] ?Phone: [phone]   Fax:  [phone] ? ?Name: Brenda Solis ?MRN: [id number] ?Date of Birth: 1935/03/02 ? ? ? ?

## 2021-09-03 ENCOUNTER — Other Ambulatory Visit: Payer: Self-pay

## 2021-09-03 ENCOUNTER — Ambulatory Visit: Payer: Medicare Other | Admitting: Physical Therapy

## 2021-09-03 DIAGNOSIS — M25561 Pain in right knee: Secondary | ICD-10-CM | POA: Diagnosis not present

## 2021-09-03 DIAGNOSIS — R600 Localized edema: Secondary | ICD-10-CM | POA: Diagnosis not present

## 2021-09-03 DIAGNOSIS — G8929 Other chronic pain: Secondary | ICD-10-CM

## 2021-09-03 DIAGNOSIS — M6281 Muscle weakness (generalized): Secondary | ICD-10-CM | POA: Diagnosis not present

## 2021-09-03 DIAGNOSIS — M25661 Stiffness of right knee, not elsewhere classified: Secondary | ICD-10-CM | POA: Diagnosis not present

## 2021-09-03 NOTE — Therapy (Signed)
[place] ?[facility] ?[address] ?[place], [place], [address] ?Phone: [phone]   Fax:  [phone] ? ?Physical Therapy Treatment ? ?Patient Details  ?Name: Brenda Solis ?MRN: [id number] ?Date of Birth: February 12, 1935 ?Referring Provider (PT): Gaynelle Arabian MD ? ? ?Encounter Date: 09/03/2021 ? ? PT End of Session - 09/03/21 1632   ? ? Visit Number 3   ? Number of Visits 8   ? Date for PT Re-Evaluation 09/24/21   ? PT Start Time [phone]   ? PT Stop Time 0445   ? PT Time Calculation (min) 46 min   ? Activity Tolerance Patient tolerated treatment well   ? Behavior During Therapy [facility] for tasks assessed/performed   ? ?  ?  ? ?  ? ? ?Past Medical History:  ?Diagnosis Date  ? Anemia   ? Anxiety   ? Depression   ? Heart murmur   ? Hyperlipidemia   ? Hypertension   ? Hypothyroidism   ? Insomnia, unspecified   ? Pneumonia   ? Spondylosis of unspecified site without mention of myelopathy   ? Thyroid disease   ? hypothyroidism  ? ? ?Past Surgical History:  ?Procedure Laterality Date  ? APPENDECTOMY    ? CATARACT EXTRACTION, BILATERAL Bilateral 2006  ? [place] SURGERY  1999  ? [place]  ? CHOLECYSTECTOMY  1990  ? DECOMPRESSIVE LUMBAR LAMINECTOMY LEVEL 1 N/A 02/01/2013  ? Procedure: CENTRAL DECOMPRESSIVE LUMBAR LAMINECTOMY LEVEL 1 L4-L5;  Surgeon: Tobi Bastos, MD;  [facility];  Service: Orthopedics;  Laterality: N/A;  ? DECOMPRESSIVE LUMBAR LAMINECTOMY LEVEL 1 N/A 11/20/2014  ? Procedure: REVISION L4-S1 DECOMPRESSION, L4-L5 IN SITU FUSION (LEVEL 1);  Surgeon: Melina Schools, MD;  Location: [place];  Service: Orthopedics;  Laterality: N/A;  ? KNEE SURGERY Right 2009  ? meniscus repair  ? SPINAL CORD STIMULATOR INSERTION N/A 06/25/2020  ? Procedure: SPINAL CORD STIMULATOR INSERTION;  Surgeon: Melina Schools, MD;  Location: [place];  Service: Orthopedics;  Laterality: N/A;  3 hrs  ? TOTAL HIP ARTHROPLASTY  2011  ? RIGHT  ? TOTAL KNEE ARTHROPLASTY Right 08/24/2021  ?  Procedure: TOTAL KNEE ARTHROPLASTY;  Surgeon: Gaynelle Arabian, MD;  [facility];  Service: Orthopedics;  Laterality: Right;  ? ? ?There were no vitals filed for this visit. ? ? Subjective Assessment - 09/03/21 1631   ? ? Subjective Ran out of pain medication.  Called doctor's office.   ? Pertinent History HTN, hypothyroidism, cervical and lumbar surgery, OP (per patient).   ? How long can you stand comfortably? ~5 minutes.   ? How long can you walk comfortably? Short distance around home with a FWW.   ? Patient Stated Goals Get better with less knee pain.   ? Currently in Pain? Yes   ? Pain Score 7    ? Pain Location Knee   ? Pain Orientation Right   ? Pain Descriptors / Indicators Aching;Sore   ? Pain Type Chronic pain   ? Pain Onset 1 to 4 weeks ago   ? ?  ?  ? ?  ? ? ? ? ? [facility] PT Assessment - 09/03/21 0001   ? ?  ? AROM  ? Overall AROM Comments Right knee active flexion to 90 degrees and passive to 95 degrees.   ? ?  ?  ? ?  ? ? ? ? ? ? ? ? ? ? ? ? ? ? ? ? [facility] Adult  PT Treatment/Exercise - 09/03/21 0001   ? ?  ? Exercises  ? Exercises Knee/Hip   ?  ? Knee/Hip Exercises: Aerobic  ? Nustep Level 1 x 15 minutes starting at seat 8 and progressing to seat 6.   ?  ? Vasopneumatic  ? Number Minutes Vasopneumatic  15 minutes   ? Vasopnuematic Location  --   Left knee.  ? Vasopneumatic Pressure Low   ?  ? Manual Therapy  ? Manual Therapy Passive ROM   ? Passive ROM In supine:  Sustained flexion and extension stretching x 10 minutes.   ? ?  ?  ? ?  ? ? ? ? ? ? ? ? ? ? ? ? ? ? ? PT Long Term Goals - 08/27/21 1802   ? ?  ? PT LONG TERM GOAL #1  ? Title Independent with a HEP.   ? Time 4   ? Period Weeks   ? Status New   ?  ? PT LONG TERM GOAL #2  ? Title Full active right knee extension in order to normalize gait.   ? Time 4   ? Period Weeks   ? Status New   ?  ? PT LONG TERM GOAL #3  ? Title Active knee flexion to 115 degrees+ so the patient can perform functional tasks and do so with pain not > 2-3/10.   ? Time 4   ?  Period Weeks   ? Status New   ?  ? PT LONG TERM GOAL #4  ? Title Increase right hip and  knee strength to a solid 4+/5 to provide good stability for accomplishment of functional activities.   ? Time 4   ? Period Weeks   ? Status New   ?  ? PT LONG TERM GOAL #5  ? Title Perform a reciprocating stair gait with one railing with pain not > 2-3/10.   ? Time 4   ? Period Weeks   ? Status New   ? ?  ?  ? ?  ? ? ? ? ? ? ? ? Plan - 09/03/21 1638   ? ? Clinical Impression Statement The patient did well today in spite of being out of her pain medication.  She said a call has been made for a refill.  She was able to improve her right knee flexion actively to 90 degrees and passive to 95 degrees.   ? Personal Factors and Comorbidities Comorbidity 1;Other   ? Comorbidities HTN, hypothyroidism, cervical and lumbar surgery, OP (per patient).   ? Examination-Activity Limitations Other;Dressing;Bathing;Locomotion Level;Stand   ? Examination-Participation Restrictions Other   ? Stability/Clinical Decision Making Stable/Uncomplicated   ? Rehab Potential Excellent   ? PT Frequency 2x / week   ? PT Duration 4 weeks   ? PT Treatment/Interventions ADLs/Self Care Home Management;Cryotherapy;Electrical Stimulation;Moist Heat;Gait training;Stair training;Functional mobility training;Therapeutic activities;Therapeutic exercise;Neuromuscular re-education;Manual techniques;Patient/family education;Passive range of motion;Vasopneumatic Device   ? PT Next Visit Plan Nustep, PROM, progress to recumbent bike.  Progress into TKA protocol.  Vasopneumatic.   ? Consulted and Agree with Plan of Care Patient   ? ?  ?  ? ?  ? ? ?Patient will benefit from skilled therapeutic intervention in order to improve the following deficits and impairments:  Abnormal gait, Difficulty walking, Decreased activity tolerance, Decreased strength, Decreased range of motion, Increased edema, Pain ? ?Visit Diagnosis: ?Chronic pain of right knee ? ?Stiffness of right knee, not  elsewhere classified ? ?Localized edema ? ? ? ? ?  Problem List ?Patient Active Problem List  ? Diagnosis Date Noted  ? Primary osteoarthritis of right knee 08/24/2021  ? Cough 07/06/2021  ? Osteoarthritis of right knee 05/18/2021  ? Chronic pain 06/25/2020  ? Obsessional thoughts   ? Mood disorder ([place])   ? Osteopenia 04/24/2014  ? Hyperlipidemia with target LDL less than 100 02/01/2014  ? Depression 02/01/2014  ? Spinal stenosis, lumbar region, with neurogenic claudication 02/01/2013  ? Hypertension 11/17/2010  ? Hypothyroidism 10/14/2010  ? Insomnia 10/14/2010  ? Gastritis 10/14/2010  ? ? ?Shary Lamos, [place], PT ?09/03/2021, 5:01 PM ? ?[place] ?[facility] ?[address] ?[place], [place], [address] ?Phone: [phone]   Fax:  [phone] ? ?Name: Brenda Solis ?MRN: [id number] ?Date of Birth: 1934/09/12 ? ? ? ?

## 2021-09-08 ENCOUNTER — Other Ambulatory Visit: Payer: Self-pay

## 2021-09-08 ENCOUNTER — Ambulatory Visit: Payer: Medicare Other | Admitting: Physical Therapy

## 2021-09-08 DIAGNOSIS — M6281 Muscle weakness (generalized): Secondary | ICD-10-CM | POA: Diagnosis not present

## 2021-09-08 DIAGNOSIS — R600 Localized edema: Secondary | ICD-10-CM

## 2021-09-08 DIAGNOSIS — M25661 Stiffness of right knee, not elsewhere classified: Secondary | ICD-10-CM | POA: Diagnosis not present

## 2021-09-08 DIAGNOSIS — G8929 Other chronic pain: Secondary | ICD-10-CM | POA: Diagnosis not present

## 2021-09-08 DIAGNOSIS — M25561 Pain in right knee: Secondary | ICD-10-CM | POA: Diagnosis not present

## 2021-09-08 NOTE — Therapy (Signed)
[place] ?[facility] ?[address] ?[place], [place], [address] ?Phone: [phone]   Fax:  [phone] ? ?Physical Therapy Treatment ? ?Patient Details  ?Name: Brenda Solis ?MRN: [id number] ?Date of Birth: Jul 28, 1934 ?Referring Provider (PT): Gaynelle Arabian MD ? ? ?Encounter Date: 09/08/2021 ? ? PT End of Session - 09/08/21 1610   ? ? Visit Number 4   ? Number of Visits 8   ? Date for PT Re-Evaluation 09/24/21   ? PT Start Time 0310   ? PT Stop Time 0359   ? PT Time Calculation (min) 49 min   ? Activity Tolerance Patient tolerated treatment well   ? Behavior During Therapy [facility] for tasks assessed/performed   ? ?  ?  ? ?  ? ? ?Past Medical History:  ?Diagnosis Date  ? Anemia   ? Anxiety   ? Depression   ? Heart murmur   ? Hyperlipidemia   ? Hypertension   ? Hypothyroidism   ? Insomnia, unspecified   ? Pneumonia   ? Spondylosis of unspecified site without mention of myelopathy   ? Thyroid disease   ? hypothyroidism  ? ? ?Past Surgical History:  ?Procedure Laterality Date  ? APPENDECTOMY    ? CATARACT EXTRACTION, BILATERAL Bilateral 2006  ? [place] SURGERY  1999  ? [place]  ? CHOLECYSTECTOMY  1990  ? DECOMPRESSIVE LUMBAR LAMINECTOMY LEVEL 1 N/A 02/01/2013  ? Procedure: CENTRAL DECOMPRESSIVE LUMBAR LAMINECTOMY LEVEL 1 L4-L5;  Surgeon: Tobi Bastos, MD;  [facility];  Service: Orthopedics;  Laterality: N/A;  ? DECOMPRESSIVE LUMBAR LAMINECTOMY LEVEL 1 N/A 11/20/2014  ? Procedure: REVISION L4-S1 DECOMPRESSION, L4-L5 IN SITU FUSION (LEVEL 1);  Surgeon: Melina Schools, MD;  Location: [place];  Service: Orthopedics;  Laterality: N/A;  ? KNEE SURGERY Right 2009  ? meniscus repair  ? SPINAL CORD STIMULATOR INSERTION N/A 06/25/2020  ? Procedure: SPINAL CORD STIMULATOR INSERTION;  Surgeon: Melina Schools, MD;  Location: [place];  Service: Orthopedics;  Laterality: N/A;  3 hrs  ? TOTAL HIP ARTHROPLASTY  2011  ? RIGHT  ? TOTAL KNEE ARTHROPLASTY Right 08/24/2021  ?  Procedure: TOTAL KNEE ARTHROPLASTY;  Surgeon: Gaynelle Arabian, MD;  [facility];  Service: Orthopedics;  Laterality: Right;  ? ? ?There were no vitals filed for this visit. ? ? Subjective Assessment - 09/08/21 1609   ? ? Subjective Patient doing well today.  Received pain medication which helps alot.   ? Pertinent History HTN, hypothyroidism, cervical and lumbar surgery, OP (per patient).   ? How long can you stand comfortably? ~5 minutes.   ? How long can you walk comfortably? Short distance around home with a FWW.   ? Currently in Pain? Yes   ? Pain Score 5    ? Pain Location Knee   ? Pain Orientation Right   ? Pain Descriptors / Indicators Aching;Sore   ? Pain Type Chronic pain   ? Pain Onset 1 to 4 weeks ago   ? ?  ?  ? ?  ? ? ? ? ? [facility] PT Assessment - 09/08/21 0001   ? ?  ? AROM  ? Overall AROM Comments Right knee extension to -10 degrees, active flexion to 103 degrees and passive flexion to 108 degrees.   ? ?  ?  ? ?  ? ? ? ? ? ? ? ? ? ? ? ? ? ? ? ? [place] Adult PT Treatment/Exercise - 09/08/21 0001   ? ?  ?  Exercises  ? Exercises Knee/Hip   ?  ? Knee/Hip Exercises: Aerobic  ? Nustep Level 3 x 17 minutes moving seat forward as tolerated to increase flexion.   ?  ? Vasopneumatic  ? Number Minutes Vasopneumatic  15 minutes   ? Vasopnuematic Location  --   Left knee.  ? Vasopneumatic Pressure Low   ?  ? Manual Therapy  ? Manual Therapy Passive ROM   ? Passive ROM In supine:  PROM to patient's left knee into flexionand extension per patient tolerance.   ? ?  ?  ? ?  ? ? ? ? ? ? ? ? ? ? ? ? ? ? ? PT Long Term Goals - 08/27/21 1802   ? ?  ? PT LONG TERM GOAL #1  ? Title Independent with a HEP.   ? Time 4   ? Period Weeks   ? Status New   ?  ? PT LONG TERM GOAL #2  ? Title Full active right knee extension in order to normalize gait.   ? Time 4   ? Period Weeks   ? Status New   ?  ? PT LONG TERM GOAL #3  ? Title Active knee flexion to 115 degrees+ so the patient can perform functional tasks and do so with pain not  > 2-3/10.   ? Time 4   ? Period Weeks   ? Status New   ?  ? PT LONG TERM GOAL #4  ? Title Increase right hip and  knee strength to a solid 4+/5 to provide good stability for accomplishment of functional activities.   ? Time 4   ? Period Weeks   ? Status New   ?  ? PT LONG TERM GOAL #5  ? Title Perform a reciprocating stair gait with one railing with pain not > 2-3/10.   ? Time 4   ? Period Weeks   ? Status New   ? ?  ?  ? ?  ? ? ? ? ? ? ? ? Plan - 09/08/21 1616   ? ? Clinical Impression Statement The patient is doing very well.  She continues to walk safely on her walker.  Today, she achieved -10 degrees of left knee extension and active flexion to 103 degrees and passively to 108 degrees.   ? Personal Factors and Comorbidities Comorbidity 1;Other   ? Comorbidities HTN, hypothyroidism, cervical and lumbar surgery, OP (per patient).   ? Examination-Activity Limitations Other;Dressing;Bathing;Locomotion Level;Stand   ? Examination-Participation Restrictions Other   ? Stability/Clinical Decision Making Stable/Uncomplicated   ? Clinical Decision Making Low   ? Rehab Potential Excellent   ? PT Frequency 2x / week   ? PT Duration 4 weeks   ? PT Treatment/Interventions ADLs/Self Care Home Management;Cryotherapy;Electrical Stimulation;Moist Heat;Gait training;Stair training;Functional mobility training;Therapeutic activities;Therapeutic exercise;Neuromuscular re-education;Manual techniques;Patient/family education;Passive range of motion;Vasopneumatic Device   ? PT Next Visit Plan Nustep, PROM, progress to recumbent bike.  Progress into TKA protocol.  Vasopneumatic.   ? Consulted and Agree with Plan of Care Patient   ? ?  ?  ? ?  ? ? ?Patient will benefit from skilled therapeutic intervention in order to improve the following deficits and impairments:  Abnormal gait, Difficulty walking, Decreased activity tolerance, Decreased strength, Decreased range of motion, Increased edema, Pain ? ?Visit Diagnosis: ?Chronic pain of  right knee ? ?Stiffness of right knee, not elsewhere classified ? ?Localized edema ? ? ? ? ?Problem List ?Patient Active Problem List  ?  Diagnosis Date Noted  ? Primary osteoarthritis of right knee 08/24/2021  ? Cough 07/06/2021  ? Osteoarthritis of right knee 05/18/2021  ? Chronic pain 06/25/2020  ? Obsessional thoughts   ? Mood disorder ([place])   ? Osteopenia 04/24/2014  ? Hyperlipidemia with target LDL less than 100 02/01/2014  ? Depression 02/01/2014  ? Spinal stenosis, lumbar region, with neurogenic claudication 02/01/2013  ? Hypertension 11/17/2010  ? Hypothyroidism 10/14/2010  ? Insomnia 10/14/2010  ? Gastritis 10/14/2010  ? ? ?Lassie Demorest, [place], PT ?09/08/2021, 4:42 PM ? ?[place] ?[facility] ?[address] ?[place], [place], [address] ?Phone: [phone]   Fax:  [phone] ? ?Name: CYD HOSTLER ?MRN: [id number] ?Date of Birth: 01-31-1935 ? ? ? ?

## 2021-09-10 ENCOUNTER — Encounter: Payer: Self-pay | Admitting: Physical Therapy

## 2021-09-10 ENCOUNTER — Other Ambulatory Visit: Payer: Self-pay

## 2021-09-10 ENCOUNTER — Ambulatory Visit: Payer: Medicare Other | Admitting: Physical Therapy

## 2021-09-10 DIAGNOSIS — M6281 Muscle weakness (generalized): Secondary | ICD-10-CM | POA: Diagnosis not present

## 2021-09-10 DIAGNOSIS — R600 Localized edema: Secondary | ICD-10-CM | POA: Diagnosis not present

## 2021-09-10 DIAGNOSIS — M25661 Stiffness of right knee, not elsewhere classified: Secondary | ICD-10-CM | POA: Diagnosis not present

## 2021-09-10 DIAGNOSIS — M25561 Pain in right knee: Secondary | ICD-10-CM | POA: Diagnosis not present

## 2021-09-10 DIAGNOSIS — G8929 Other chronic pain: Secondary | ICD-10-CM | POA: Diagnosis not present

## 2021-09-10 NOTE — Therapy (Signed)
[place] ?[facility] ?[address] ?[place], [place], [address] ?Phone: [phone]   Fax:  [phone] ? ?Physical Therapy Treatment ? ?Patient Details  ?Name: Brenda Solis ?MRN: [id number] ?Date of Birth: 1935-02-08 ?Referring Provider (PT): Gaynelle Arabian MD ? ? ?Encounter Date: 09/10/2021 ? ? PT End of Session - 09/10/21 1620   ? ? Visit Number 5   ? Number of Visits 8   ? Date for PT Re-Evaluation 09/24/21   ? PT Start Time [phone]   ? PT Stop Time 0444   ? PT Time Calculation (min) 52 min   ? Activity Tolerance Patient tolerated treatment well   ? Behavior During Therapy [facility] for tasks assessed/performed   ? ?  ?  ? ?  ? ? ?Past Medical History:  ?Diagnosis Date  ? Anemia   ? Anxiety   ? Depression   ? Heart murmur   ? Hyperlipidemia   ? Hypertension   ? Hypothyroidism   ? Insomnia, unspecified   ? Pneumonia   ? Spondylosis of unspecified site without mention of myelopathy   ? Thyroid disease   ? hypothyroidism  ? ? ?Past Surgical History:  ?Procedure Laterality Date  ? APPENDECTOMY    ? CATARACT EXTRACTION, BILATERAL Bilateral 2006  ? [place] SURGERY  1999  ? [place]  ? CHOLECYSTECTOMY  1990  ? DECOMPRESSIVE LUMBAR LAMINECTOMY LEVEL 1 N/A 02/01/2013  ? Procedure: CENTRAL DECOMPRESSIVE LUMBAR LAMINECTOMY LEVEL 1 L4-L5;  Surgeon: Tobi Bastos, MD;  [facility];  Service: Orthopedics;  Laterality: N/A;  ? DECOMPRESSIVE LUMBAR LAMINECTOMY LEVEL 1 N/A 11/20/2014  ? Procedure: REVISION L4-S1 DECOMPRESSION, L4-L5 IN SITU FUSION (LEVEL 1);  Surgeon: Melina Schools, MD;  Location: [place];  Service: Orthopedics;  Laterality: N/A;  ? KNEE SURGERY Right 2009  ? meniscus repair  ? SPINAL CORD STIMULATOR INSERTION N/A 06/25/2020  ? Procedure: SPINAL CORD STIMULATOR INSERTION;  Surgeon: Melina Schools, MD;  Location: [place];  Service: Orthopedics;  Laterality: N/A;  3 hrs  ? TOTAL HIP ARTHROPLASTY  2011  ? RIGHT  ? TOTAL KNEE ARTHROPLASTY Right 08/24/2021  ?  Procedure: TOTAL KNEE ARTHROPLASTY;  Surgeon: Gaynelle Arabian, MD;  [facility];  Service: Orthopedics;  Laterality: Right;  ? ? ?There were no vitals filed for this visit. ? ? Subjective Assessment - 09/10/21 1615   ? ? Subjective COVID-19 screen performed prior to patient entering clinic.  Doctor was very pleased.   ? Pertinent History HTN, hypothyroidism, cervical and lumbar surgery, OP (per patient).   ? How long can you stand comfortably? ~5 minutes.   ? Patient Stated Goals Get better with less knee pain.   ? Currently in Pain? Yes   ? Pain Score 4    ? Pain Location Knee   ? Pain Orientation Right   ? Pain Descriptors / Indicators Aching;Sore   ? Pain Type Chronic pain   ? Pain Onset 1 to 4 weeks ago   ? ?  ?  ? ?  ? ? ? ? ? ? ? ? ? ? ? ? ? ? ? ? ? ? ? ? [place] Adult PT Treatment/Exercise - 09/10/21 0001   ? ?  ? Exercises  ? Exercises Knee/Hip   ?  ? Knee/Hip Exercises: Aerobic  ? Nustep Level 3 x 15 minutes moving seat forward as tolerated to increase knee flexion.   ?  ? Knee/Hip Exercises: Supine  ? Short Arc  Quad Sets Limitations SAQ's  facilitated with VMS to right quadriceps with 10 sec extension holds and 10 sec rest x 16 minutes.   ?  ? Vasopneumatic  ? Number Minutes Vasopneumatic  15 minutes   ? Vasopnuematic Location  --   Right knee.  ? Vasopneumatic Pressure Low   ? ?  ?  ? ?  ? ? ? ? ? ? ? ? ? ? ? ? ? ? ? PT Long Term Goals - 08/27/21 1802   ? ?  ? PT LONG TERM GOAL #1  ? Title Independent with a HEP.   ? Time 4   ? Period Weeks   ? Status New   ?  ? PT LONG TERM GOAL #2  ? Title Full active right knee extension in order to normalize gait.   ? Time 4   ? Period Weeks   ? Status New   ?  ? PT LONG TERM GOAL #3  ? Title Active knee flexion to 115 degrees+ so the patient can perform functional tasks and do so with pain not > 2-3/10.   ? Time 4   ? Period Weeks   ? Status New   ?  ? PT LONG TERM GOAL #4  ? Title Increase right hip and  knee strength to a solid 4+/5 to provide good stability for  accomplishment of functional activities.   ? Time 4   ? Period Weeks   ? Status New   ?  ? PT LONG TERM GOAL #5  ? Title Perform a reciprocating stair gait with one railing with pain not > 2-3/10.   ? Time 4   ? Period Weeks   ? Status New   ? ?  ?  ? ?  ? ? ? ? ? ? ? ? Plan - 09/10/21 1626   ? ? Clinical Impression Statement The patient is making excellent progress.  Achieved near full extension performung SAQ's facilitated with VMS.   ? Personal Factors and Comorbidities Comorbidity 1;Other   ? Comorbidities HTN, hypothyroidism, cervical and lumbar surgery, OP (per patient).   ? Examination-Activity Limitations Other;Dressing;Bathing;Locomotion Level;Stand   ? Examination-Participation Restrictions Other   ? Stability/Clinical Decision Making Stable/Uncomplicated   ? Rehab Potential Excellent   ? PT Frequency 2x / week   ? PT Duration 4 weeks   ? PT Treatment/Interventions ADLs/Self Care Home Management;Cryotherapy;Electrical Stimulation;Moist Heat;Gait training;Stair training;Functional mobility training;Therapeutic activities;Therapeutic exercise;Neuromuscular re-education;Manual techniques;Patient/family education;Passive range of motion;Vasopneumatic Device   ? PT Next Visit Plan Progress to recumbent bike.   ? Consulted and Agree with Plan of Care Patient   ? ?  ?  ? ?  ? ? ?Patient will benefit from skilled therapeutic intervention in order to improve the following deficits and impairments:  Abnormal gait, Difficulty walking, Decreased activity tolerance, Decreased strength, Decreased range of motion, Increased edema, Pain ? ?Visit Diagnosis: ?Chronic pain of right knee ? ?Stiffness of right knee, not elsewhere classified ? ?Localized edema ? ? ? ? ?Problem List ?Patient Active Problem List  ? Diagnosis Date Noted  ? Primary osteoarthritis of right knee 08/24/2021  ? Cough 07/06/2021  ? Osteoarthritis of right knee 05/18/2021  ? Chronic pain 06/25/2020  ? Obsessional thoughts   ? Mood disorder ([place])   ?  Osteopenia 04/24/2014  ? Hyperlipidemia with target LDL less than 100 02/01/2014  ? Depression 02/01/2014  ? Spinal stenosis, lumbar region, with neurogenic claudication 02/01/2013  ? Hypertension 11/17/2010  ? Hypothyroidism 10/14/2010  ?  Insomnia 10/14/2010  ? Gastritis 10/14/2010  ? ? ?Sayid Moll, [place], PT ?09/10/2021, 5:10 PM ? ?[place] ?[facility] ?[address] ?[place], [place], [address] ?Phone: [phone]   Fax:  [phone] ? ?Name: Brenda Solis ?MRN: [id number] ?Date of Birth: 1935/05/27 ? ? ? ?

## 2021-09-15 ENCOUNTER — Ambulatory Visit: Payer: Medicare Other | Admitting: Physical Therapy

## 2021-09-15 ENCOUNTER — Other Ambulatory Visit: Payer: Self-pay

## 2021-09-15 DIAGNOSIS — M25661 Stiffness of right knee, not elsewhere classified: Secondary | ICD-10-CM

## 2021-09-15 DIAGNOSIS — G8929 Other chronic pain: Secondary | ICD-10-CM

## 2021-09-15 DIAGNOSIS — M6281 Muscle weakness (generalized): Secondary | ICD-10-CM | POA: Diagnosis not present

## 2021-09-15 DIAGNOSIS — R600 Localized edema: Secondary | ICD-10-CM | POA: Diagnosis not present

## 2021-09-15 DIAGNOSIS — M25561 Pain in right knee: Secondary | ICD-10-CM | POA: Diagnosis not present

## 2021-09-15 NOTE — Therapy (Addendum)
[place] ?[facility] ?[address] ?[place], [place], [address] ?Phone: [phone]   Fax:  [phone] ? ?Physical Therapy Treatment ? ?Patient Details  ?Name: Brenda Solis ?MRN: [id number] ?Date of Birth: 03/30/1935 ?Referring Provider (PT): Gaynelle Arabian MD ? ? ?Encounter Date: 09/15/2021 ? ? PT End of Session - 09/15/21 1718   ? ? Visit Number 6   ? Number of Visits 8   ? Date for PT Re-Evaluation 09/24/21   ? PT Start Time [phone]   ? PT Stop Time 0453   ? PT Time Calculation (min) 57 min   ? Activity Tolerance Patient tolerated treatment well   ? Behavior During Therapy [facility] for tasks assessed/performed   ? ?  ?  ? ?  ? ? ?Past Medical History:  ?Diagnosis Date  ? Anemia   ? Anxiety   ? Depression   ? Heart murmur   ? Hyperlipidemia   ? Hypertension   ? Hypothyroidism   ? Insomnia, unspecified   ? Pneumonia   ? Spondylosis of unspecified site without mention of myelopathy   ? Thyroid disease   ? hypothyroidism  ? ? ?Past Surgical History:  ?Procedure Laterality Date  ? APPENDECTOMY    ? CATARACT EXTRACTION, BILATERAL Bilateral 2006  ? [place] SURGERY  1999  ? [place]  ? CHOLECYSTECTOMY  1990  ? DECOMPRESSIVE LUMBAR LAMINECTOMY LEVEL 1 N/A 02/01/2013  ? Procedure: CENTRAL DECOMPRESSIVE LUMBAR LAMINECTOMY LEVEL 1 L4-L5;  Surgeon: Tobi Bastos, MD;  [facility];  Service: Orthopedics;  Laterality: N/A;  ? DECOMPRESSIVE LUMBAR LAMINECTOMY LEVEL 1 N/A 11/20/2014  ? Procedure: REVISION L4-S1 DECOMPRESSION, L4-L5 IN SITU FUSION (LEVEL 1);  Surgeon: Melina Schools, MD;  Location: [place];  Service: Orthopedics;  Laterality: N/A;  ? KNEE SURGERY Right 2009  ? meniscus repair  ? SPINAL CORD STIMULATOR INSERTION N/A 06/25/2020  ? Procedure: SPINAL CORD STIMULATOR INSERTION;  Surgeon: Melina Schools, MD;  Location: [place];  Service: Orthopedics;  Laterality: N/A;  3 hrs  ? TOTAL HIP ARTHROPLASTY  2011  ? RIGHT  ? TOTAL KNEE ARTHROPLASTY Right 08/24/2021  ?  Procedure: TOTAL KNEE ARTHROPLASTY;  Surgeon: Gaynelle Arabian, MD;  [facility];  Service: Orthopedics;  Laterality: Right;  ? ? ?There were no vitals filed for this visit. ? ? Subjective Assessment - 09/15/21 1623   ? ? Subjective Patient has been having soome right hip pain.   ? Pertinent History HTN, hypothyroidism, cervical and lumbar surgery, OP (per patient).   ? How long can you stand comfortably? ~5 minutes.   ? How long can you walk comfortably? Short distance around home with a FWW.   ? Currently in Pain? Yes   ? Pain Score 4    ? Pain Location --   RT knee and hip.  ? Pain Orientation Right   ? Pain Descriptors / Indicators Aching;Sore   ? Pain Type Chronic pain   ? Pain Onset 1 to 4 weeks ago   ? ?  ?  ? ?  ? ? ? ? ? ? ? ? ? ? ? ? ? ? ? ? ? ? ? ? [place] Adult PT Treatment/Exercise - 09/15/21 0001   ? ?  ? Exercises  ? Exercises Knee/Hip   ?  ? Knee/Hip Exercises: Aerobic  ? Recumbent Bike Level 2 x 5 minutes at seat 5.   ? Nustep Level 3 x 11 minutes moving seat forward x  2 to increase flexion.   ?  ? Knee/Hip Exercises: Supine  ? Short Arc [employer] Limitations SAQ's facilitated with Bi-Phasic electrical stimulation x 15 minutes with 10 sec extension holds f/b a 10 sec rest.   ?  ? Vasopneumatic  ? Number Minutes Vasopneumatic  15 minutes   ? Vasopnuematic Location  --   Right knee.  ? Vasopneumatic Pressure Medium   ? ?  ?  ? ?  ? ? ? ? ? ? ? ? ? ? ? ? ? ? ? PT Long Term Goals - 08/27/21 1802   ? ?  ? PT LONG TERM GOAL #1  ? Title Independent with a HEP.   ? Time 4   ? Period Weeks   ? Status New   ?  ? PT LONG TERM GOAL #2  ? Title Full active right knee extension in order to normalize gait.   ? Time 4   ? Period Weeks   ? Status New   ?  ? PT LONG TERM GOAL #3  ? Title Active knee flexion to 115 degrees+ so the patient can perform functional tasks and do so with pain not > 2-3/10.   ? Time 4   ? Period Weeks   ? Status New   ?  ? PT LONG TERM GOAL #4  ? Title Increase right hip and  knee strength  to a solid 4+/5 to provide good stability for accomplishment of functional activities.   ? Time 4   ? Period Weeks   ? Status New   ?  ? PT LONG TERM GOAL #5  ? Title Perform a reciprocating stair gait with one railing with pain not > 2-3/10.   ? Time 4   ? Period Weeks   ? Status New   ? ?  ?  ? ?  ? ? ? ? ? ? ? ? Plan - 09/15/21 1627   ? ? Clinical Impression Statement The patient did an outstanding job today and was able to make forward revolutions on the recumbent bike today at seat 6.  She has been having some right hip pain.  She had c/o in the area of her right SIJ and some referral to the groin region.  This is likely due to gait changing and is expected to resolve once she achieves a more normal gait cycle.  Normal modality response following removal of modalities today.   ? Personal Factors and Comorbidities Comorbidity 1;Other   ? Comorbidities HTN, hypothyroidism, cervical and lumbar surgery, OP (per patient).   ? Examination-Participation Restrictions Other   ? Stability/Clinical Decision Making Stable/Uncomplicated   ? Rehab Potential Excellent   ? PT Frequency 2x / week   ? PT Duration 4 weeks   ? PT Treatment/Interventions ADLs/Self Care Home Management;Cryotherapy;Electrical Stimulation;Moist Heat;Gait training;Stair training;Functional mobility training;Therapeutic activities;Therapeutic exercise;Neuromuscular re-education;Manual techniques;Patient/family education;Passive range of motion;Vasopneumatic Device   ? ?  ?  ? ?  ? ? ?Patient will benefit from skilled therapeutic intervention in order to improve the following deficits and impairments:  Abnormal gait, Difficulty walking, Decreased activity tolerance, Decreased strength, Decreased range of motion, Increased edema, Pain ? ?Visit Diagnosis: ?Chronic pain of right knee ? ?Stiffness of right knee, not elsewhere classified ? ?Localized edema ? ? ? ? ?Problem List ?Patient Active Problem List  ? Diagnosis Date Noted  ? Primary osteoarthritis of  right knee 08/24/2021  ? Cough 07/06/2021  ? Osteoarthritis of right knee 05/18/2021  ? Chronic  pain 06/25/2020  ? Obsessional thoughts   ? Mood disorder ([place])   ? Osteopenia 04/24/2014  ? Hyperlipidemia with target LDL less than 100 02/01/2014  ? Depression 02/01/2014  ? Spinal stenosis, lumbar region, with neurogenic claudication 02/01/2013  ? Hypertension 11/17/2010  ? Hypothyroidism 10/14/2010  ? Insomnia 10/14/2010  ? Gastritis 10/14/2010  ? ? ?Alleya Demeter, [place], PT ?09/15/2021, 5:32 PM ? ?[place] ?[facility] ?[place] ?[place], [place], [address] ?Phone: [phone]   Fax:  [phone] ? ?Name: Brenda Solis ?MRN: [id number] ?Date of Birth: 04/22/1935 ? ? ? ?

## 2021-09-17 ENCOUNTER — Other Ambulatory Visit: Payer: Self-pay

## 2021-09-17 ENCOUNTER — Ambulatory Visit: Payer: Medicare Other | Admitting: Physical Therapy

## 2021-09-17 DIAGNOSIS — M25661 Stiffness of right knee, not elsewhere classified: Secondary | ICD-10-CM

## 2021-09-17 DIAGNOSIS — G8929 Other chronic pain: Secondary | ICD-10-CM | POA: Diagnosis not present

## 2021-09-17 DIAGNOSIS — M6281 Muscle weakness (generalized): Secondary | ICD-10-CM | POA: Diagnosis not present

## 2021-09-17 DIAGNOSIS — R600 Localized edema: Secondary | ICD-10-CM

## 2021-09-17 DIAGNOSIS — M25561 Pain in right knee: Secondary | ICD-10-CM | POA: Diagnosis not present

## 2021-09-17 NOTE — Therapy (Signed)
[place] ?[facility] ?[address] ?[place], [place], [address] ?Phone: [phone]   Fax:  [phone] ? ?Physical Therapy Treatment ? ?Patient Details  ?Name: Brenda Solis ?MRN: [id number] ?Date of Birth: 02-15-1935 ?Referring Provider (PT): Gaynelle Arabian MD ? ? ?Encounter Date: 09/17/2021 ? ? PT End of Session - 09/17/21 1634   ? ? Visit Number 7   ? Number of Visits 8   ? Date for PT Re-Evaluation 09/24/21   ? PT Start Time [phone]   ? PT Stop Time 0444   ? PT Time Calculation (min) 47 min   ? Activity Tolerance Patient tolerated treatment well   ? Behavior During Therapy [facility] for tasks assessed/performed   ? ?  ?  ? ?  ? ? ?Past Medical History:  ?Diagnosis Date  ? Anemia   ? Anxiety   ? Depression   ? Heart murmur   ? Hyperlipidemia   ? Hypertension   ? Hypothyroidism   ? Insomnia, unspecified   ? Pneumonia   ? Spondylosis of unspecified site without mention of myelopathy   ? Thyroid disease   ? hypothyroidism  ? ? ?Past Surgical History:  ?Procedure Laterality Date  ? APPENDECTOMY    ? CATARACT EXTRACTION, BILATERAL Bilateral 2006  ? [place] SURGERY  1999  ? [place]  ? CHOLECYSTECTOMY  1990  ? DECOMPRESSIVE LUMBAR LAMINECTOMY LEVEL 1 N/A 02/01/2013  ? Procedure: CENTRAL DECOMPRESSIVE LUMBAR LAMINECTOMY LEVEL 1 L4-L5;  Surgeon: Tobi Bastos, MD;  [facility];  Service: Orthopedics;  Laterality: N/A;  ? DECOMPRESSIVE LUMBAR LAMINECTOMY LEVEL 1 N/A 11/20/2014  ? Procedure: REVISION L4-S1 DECOMPRESSION, L4-L5 IN SITU FUSION (LEVEL 1);  Surgeon: Melina Schools, MD;  Location: [place];  Service: Orthopedics;  Laterality: N/A;  ? KNEE SURGERY Right 2009  ? meniscus repair  ? SPINAL CORD STIMULATOR INSERTION N/A 06/25/2020  ? Procedure: SPINAL CORD STIMULATOR INSERTION;  Surgeon: Melina Schools, MD;  Location: [place];  Service: Orthopedics;  Laterality: N/A;  3 hrs  ? TOTAL HIP ARTHROPLASTY  2011  ? RIGHT  ? TOTAL KNEE ARTHROPLASTY Right 08/24/2021  ?  Procedure: TOTAL KNEE ARTHROPLASTY;  Surgeon: Gaynelle Arabian, MD;  [facility];  Service: Orthopedics;  Laterality: Right;  ? ? ?There were no vitals filed for this visit. ? ? Subjective Assessment - 09/17/21 1613   ? ? Subjective Doing better.  Pain at a 3 today.   ? Pertinent History HTN, hypothyroidism, cervical and lumbar surgery, OP (per patient).   ? How long can you stand comfortably? ~5 minutes.   ? How long can you walk comfortably? Short distance around home with a FWW.   ? Patient Stated Goals Get better with less knee pain.   ? Currently in Pain? Yes   ? Pain Score 3    ? Pain Location Knee   ? Pain Orientation Right   ? Pain Descriptors / Indicators Aching;Sore   ? Pain Type Chronic pain   ? Pain Onset 1 to 4 weeks ago   ? ?  ?  ? ?  ? ? ? ? ? [facility] PT Assessment - 09/17/21 0001   ? ?  ? AROM  ? AROM Assessment Site Knee   ? Right/Left Knee Right   ? Right Knee Extension 10   ? Right Knee Flexion 115   ? ?  ?  ? ?  ? ? ? ? ? ? ? ? ? ? ? ? ? ? ? ?  [place] Adult PT Treatment/Exercise - 09/17/21 0001   ? ?  ? Exercises  ? Exercises Knee/Hip   ?  ? Knee/Hip Exercises: Aerobic  ? Recumbent Bike Level 2 x 4 minutes on seat 6.   ? Nustep Level 3 x 5 minutes.   ?  ? Knee/Hip Exercises: Seated  ? Long Arc Quad Limitations 2# x 3 minutes.   ?  ? Knee/Hip Exercises: Supine  ? Short Arc [employer] Limitations 2# x 3 minutes.   ?  ? Vasopneumatic  ? Number Minutes Vasopneumatic  15 minutes   ? Vasopnuematic Location  --   Right knee.  ? Vasopneumatic Pressure Low   ?  ? Manual Therapy  ? Manual Therapy Passive ROM   ? Passive ROM In supine:  Right knee overpressure stretching, hamstring stretching and flexion stretching x 8 minutes.   ? ?  ?  ? ?  ? ? ? ? ? ? ? ? ? ? ? ? ? ? ? PT Long Term Goals - 08/27/21 1802   ? ?  ? PT LONG TERM GOAL #1  ? Title Independent with a HEP.   ? Time 4   ? Period Weeks   ? Status New   ?  ? PT LONG TERM GOAL #2  ? Title Full active right knee extension in order to normalize gait.   ?  Time 4   ? Period Weeks   ? Status New   ?  ? PT LONG TERM GOAL #3  ? Title Active knee flexion to 115 degrees+ so the patient can perform functional tasks and do so with pain not > 2-3/10.   ? Time 4   ? Period Weeks   ? Status New   ?  ? PT LONG TERM GOAL #4  ? Title Increase right hip and  knee strength to a solid 4+/5 to provide good stability for accomplishment of functional activities.   ? Time 4   ? Period Weeks   ? Status New   ?  ? PT LONG TERM GOAL #5  ? Title Perform a reciprocating stair gait with one railing with pain not > 2-3/10.   ? Time 4   ? Period Weeks   ? Status New   ? ?  ?  ? ?  ? ? ? ? ? ? ? ? Plan - 09/17/21 1631   ? ? Clinical Impression Statement The patient doing very well.  Her pain is lower today though she has pain over her right SIJ.  Her active right knee flexion has improved to 115 degrees and passive to 120 degrees.   ? Personal Factors and Comorbidities Comorbidity 1;Other   ? Comorbidities HTN, hypothyroidism, cervical and lumbar surgery, OP (per patient).   ? Examination-Activity Limitations Other;Dressing;Bathing;Locomotion Level;Stand   ? Examination-Participation Restrictions Other   ? Stability/Clinical Decision Making Stable/Uncomplicated   ? Rehab Potential Excellent   ? PT Frequency 2x / week   ? PT Duration 4 weeks   ? PT Treatment/Interventions ADLs/Self Care Home Management;Cryotherapy;Electrical Stimulation;Moist Heat;Gait training;Stair training;Functional mobility training;Therapeutic activities;Therapeutic exercise;Neuromuscular re-education;Manual techniques;Patient/family education;Passive range of motion;Vasopneumatic Device   ? PT Next Visit Plan Progress to recumbent bike.   ? Consulted and Agree with Plan of Care Patient   ? ?  ?  ? ?  ? ? ?Patient will benefit from skilled therapeutic intervention in order to improve the following deficits and impairments:  Abnormal gait, Difficulty walking, Decreased activity tolerance, Decreased strength,  Decreased range  of motion, Increased edema, Pain ? ?Visit Diagnosis: ?Chronic pain of right knee ? ?Stiffness of right knee, not elsewhere classified ? ?Localized edema ? ? ? ? ?Problem List ?Patient Active Problem List  ? Diagnosis Date Noted  ? Primary osteoarthritis of right knee 08/24/2021  ? Cough 07/06/2021  ? Osteoarthritis of right knee 05/18/2021  ? Chronic pain 06/25/2020  ? Obsessional thoughts   ? Mood disorder ([place])   ? Osteopenia 04/24/2014  ? Hyperlipidemia with target LDL less than 100 02/01/2014  ? Depression 02/01/2014  ? Spinal stenosis, lumbar region, with neurogenic claudication 02/01/2013  ? Hypertension 11/17/2010  ? Hypothyroidism 10/14/2010  ? Insomnia 10/14/2010  ? Gastritis 10/14/2010  ? ? ?Becker Christopher, [place], PT ?09/17/2021, 5:18 PM ? ?[place] ?[facility] ?[place] [address] ?[place], [place], [address] ?Phone: [phone]   Fax:  [phone] ? ?Name: Brenda Solis ?MRN: [id number] ?Date of Birth: 1935/02/02 ? ? ? ?

## 2021-09-22 ENCOUNTER — Encounter: Payer: Self-pay | Admitting: Physical Therapy

## 2021-09-22 ENCOUNTER — Ambulatory Visit: Payer: Medicare Other | Admitting: Physical Therapy

## 2021-09-22 ENCOUNTER — Other Ambulatory Visit: Payer: Self-pay

## 2021-09-22 DIAGNOSIS — M6281 Muscle weakness (generalized): Secondary | ICD-10-CM | POA: Diagnosis not present

## 2021-09-22 DIAGNOSIS — R600 Localized edema: Secondary | ICD-10-CM | POA: Diagnosis not present

## 2021-09-22 DIAGNOSIS — M25661 Stiffness of right knee, not elsewhere classified: Secondary | ICD-10-CM | POA: Diagnosis not present

## 2021-09-22 DIAGNOSIS — G8929 Other chronic pain: Secondary | ICD-10-CM | POA: Diagnosis not present

## 2021-09-22 DIAGNOSIS — M25561 Pain in right knee: Secondary | ICD-10-CM | POA: Diagnosis not present

## 2021-09-22 NOTE — Therapy (Signed)
[place] ?[facility] ?[place] ?[place], [place], [address] ?Phone: [phone]   Fax:  [phone] ? ?Physical Therapy Treatment ? ?Patient Details  ?Name: Brenda Solis ?MRN: [id number] ?Date of Birth: 1934-10-02 ?Referring Provider (PT): Gaynelle Arabian MD ? ? ?Encounter Date: 09/22/2021 ? ? PT End of Session - 09/22/21 1608   ? ? Visit Number 8   ? Number of Visits 14   ? Date for PT Re-Evaluation 10/22/21   ? PT Start Time 0400   ? PT Stop Time 0449   ? PT Time Calculation (min) 49 min   ? Activity Tolerance Patient tolerated treatment well   ? Behavior During Therapy [facility] for tasks assessed/performed   ? ?  ?  ? ?  ? ? ?Past Medical History:  ?Diagnosis Date  ? Anemia   ? Anxiety   ? Depression   ? Heart murmur   ? Hyperlipidemia   ? Hypertension   ? Hypothyroidism   ? Insomnia, unspecified   ? Pneumonia   ? Spondylosis of unspecified site without mention of myelopathy   ? Thyroid disease   ? hypothyroidism  ? ? ?Past Surgical History:  ?Procedure Laterality Date  ? APPENDECTOMY    ? CATARACT EXTRACTION, BILATERAL Bilateral 2006  ? [place] SURGERY  1999  ? [place]  ? CHOLECYSTECTOMY  1990  ? DECOMPRESSIVE LUMBAR LAMINECTOMY LEVEL 1 N/A 02/01/2013  ? Procedure: CENTRAL DECOMPRESSIVE LUMBAR LAMINECTOMY LEVEL 1 L4-L5;  Surgeon: Tobi Bastos, MD;  [facility];  Service: Orthopedics;  Laterality: N/A;  ? DECOMPRESSIVE LUMBAR LAMINECTOMY LEVEL 1 N/A 11/20/2014  ? Procedure: REVISION L4-S1 DECOMPRESSION, L4-L5 IN SITU FUSION (LEVEL 1);  Surgeon: Melina Schools, MD;  Location: [place];  Service: Orthopedics;  Laterality: N/A;  ? KNEE SURGERY Right 2009  ? meniscus repair  ? SPINAL CORD STIMULATOR INSERTION N/A 06/25/2020  ? Procedure: SPINAL CORD STIMULATOR INSERTION;  Surgeon: Melina Schools, MD;  Location: [place];  Service: Orthopedics;  Laterality: N/A;  3 hrs  ? TOTAL HIP ARTHROPLASTY  2011  ? RIGHT  ? TOTAL KNEE ARTHROPLASTY Right 08/24/2021  ?  Procedure: TOTAL KNEE ARTHROPLASTY;  Surgeon: Gaynelle Arabian, MD;  [facility];  Service: Orthopedics;  Laterality: Right;  ? ? ?There were no vitals filed for this visit. ? ? Subjective Assessment - 09/22/21 1607   ? ? Subjective Right low back still better.  Back pain is at a 6/10.   ? Pertinent History HTN, hypothyroidism, cervical and lumbar surgery, OP (per patient).   ? How long can you stand comfortably? ~5 minutes.   ? How long can you walk comfortably? Short distance around home with a FWW.   ? Patient Stated Goals Get better with less knee pain.   ? Currently in Pain? Yes   ? Pain Score 3    ? Pain Location Knee   ? Pain Orientation Right   ? Pain Descriptors / Indicators Aching;Sore   ? ?  ?  ? ?  ? ? ? ? ? [facility] PT Assessment - 09/22/21 0001   ? ?  ? AROM  ? AROM Assessment Site Knee   ? Right/Left Knee Right   ? Right Knee Extension 10   ? Right Knee Flexion 125   ? ?  ?  ? ?  ? ? ? ? ? ? ? ? ? ? ? ? ? ? ? ? [facility] Adult PT Treatment/Exercise - 09/22/21 0001   ? ?  ?  Exercises  ? Exercises Knee/Hip   ?  ? Knee/Hip Exercises: Aerobic  ? Nustep Level 4 x 15 minutes moving seat forward x 2.   ?  ? Knee/Hip Exercises: Supine  ? Short Arc [employer] Limitations 5# x 3 minutes.   ?  ? Vasopneumatic  ? Number Minutes Vasopneumatic  15 minutes   ? Vasopnuematic Location  --   Right knee.  ? Vasopneumatic Pressure Medium   ?  ? Manual Therapy  ? Manual Therapy Passive ROM   ? Passive ROM In supine:  PROM with focus on right knee extension x 8 minutes with low load long duration stretching technique utilized.   ? ?  ?  ? ?  ? ? ? ? ? ? ? ? ? ? ? ? ? ? ? PT Long Term Goals - 08/27/21 1802   ? ?  ? PT LONG TERM GOAL #1  ? Title Independent with a HEP.   ? Time 4   ? Period Weeks   ? Status New   ?  ? PT LONG TERM GOAL #2  ? Title Full active right knee extension in order to normalize gait.   ? Time 4   ? Period Weeks   ? Status New   ?  ? PT LONG TERM GOAL #3  ? Title Active knee flexion to 115 degrees+ so the  patient can perform functional tasks and do so with pain not > 2-3/10.   ? Time 4   ? Period Weeks   ? Status New   ?  ? PT LONG TERM GOAL #4  ? Title Increase right hip and  knee strength to a solid 4+/5 to provide good stability for accomplishment of functional activities.   ? Time 4   ? Period Weeks   ? Status New   ?  ? PT LONG TERM GOAL #5  ? Title Perform a reciprocating stair gait with one railing with pain not > 2-3/10.   ? Time 4   ? Period Weeks   ? Status New   ? ?  ?  ? ?  ? ? ? ? ? ? ? ? Plan - 09/22/21 1717   ? ? Clinical Impression Statement The patient achieved 125 degrees of active right knee flexion.  We discussed compliance to her HEP.  She was provided with two extension stretches to begin at home.   ? Personal Factors and Comorbidities Comorbidity 1;Other   ? Comorbidities HTN, hypothyroidism, cervical and lumbar surgery, OP (per patient).   ? Examination-Activity Limitations Other;Dressing;Bathing;Locomotion Level;Stand   ? Examination-Participation Restrictions Other   ? Stability/Clinical Decision Making Stable/Uncomplicated   ? Rehab Potential Excellent   ? PT Frequency 2x / week   ? PT Duration 4 weeks   ? PT Treatment/Interventions ADLs/Self Care Home Management;Cryotherapy;Electrical Stimulation;Moist Heat;Gait training;Stair training;Functional mobility training;Therapeutic activities;Therapeutic exercise;Neuromuscular re-education;Manual techniques;Patient/family education;Passive range of motion;Vasopneumatic Device   ? PT Next Visit Plan Progress to recumbent bike.   ? Consulted and Agree with Plan of Care Patient   ? ?  ?  ? ?  ? ? ?Patient will benefit from skilled therapeutic intervention in order to improve the following deficits and impairments:  Abnormal gait, Difficulty walking, Decreased activity tolerance, Decreased strength, Decreased range of motion, Increased edema, Pain ? ?Visit Diagnosis: ?Chronic pain of right knee - Plan: PT plan of care cert/re-cert ? ?Stiffness of  right knee, not elsewhere classified - Plan: PT plan of care cert/re-cert ? ?  Localized edema - Plan: PT plan of care cert/re-cert ? ? ? ? ?Problem List ?Patient Active Problem List  ? Diagnosis Date Noted  ? Primary osteoarthritis of right knee 08/24/2021  ? Cough 07/06/2021  ? Osteoarthritis of right knee 05/18/2021  ? Chronic pain 06/25/2020  ? Obsessional thoughts   ? Mood disorder ([place])   ? Osteopenia 04/24/2014  ? Hyperlipidemia with target LDL less than 100 02/01/2014  ? Depression 02/01/2014  ? Spinal stenosis, lumbar region, with neurogenic claudication 02/01/2013  ? Hypertension 11/17/2010  ? Hypothyroidism 10/14/2010  ? Insomnia 10/14/2010  ? Gastritis 10/14/2010  ? ? ?Ramon Brant, [place], PT ?09/22/2021, 5:26 PM ? ?[place] ?[facility] ?[place] ?[place], [place], [address] ?Phone: [phone]   Fax:  [phone] ? ?Name: CHRISTYL OSENTOSKI ?MRN: [id number] ?Date of Birth: 1934-08-25 ? ? ? ?

## 2021-09-22 NOTE — Patient Instructions (Signed)
[place] ?Created by [place] Laiden Milles Mar 28th, 2023 ?View at [email or web address]-exercise-[email or web address] using code: [id number] ?Total 2 Page 1 of 1 ?KNEE EXTENSION STRETCH - PROPPED ?While seated, prop your foot up on another chair and allow gravity ?to stretch your knee towards a more straightened position. ?Repeat 1 Time Hold 10 Minutes ?Complete 1 Set Perform 3 Times a Day ?Supine Knee Extension ?Lying on your back, elevate your foot using a pillow or stack of ?blankets or anything comfortable, but high enough so your knee is ?not touching the table. Begin with no weight then progress to 5# ?over your knee (above kneecap on quad muscle), and just relax to ?passively stretch the back of your knee and move it more into ?extension. ?Repeat 1 Time Hold 10 Minutes ?Complete 1 Set Perform 3 Times a Day ?

## 2021-09-24 ENCOUNTER — Ambulatory Visit: Payer: Medicare Other | Admitting: *Deleted

## 2021-09-24 DIAGNOSIS — R600 Localized edema: Secondary | ICD-10-CM

## 2021-09-24 DIAGNOSIS — G8929 Other chronic pain: Secondary | ICD-10-CM

## 2021-09-24 DIAGNOSIS — M6281 Muscle weakness (generalized): Secondary | ICD-10-CM | POA: Diagnosis not present

## 2021-09-24 DIAGNOSIS — M25561 Pain in right knee: Secondary | ICD-10-CM | POA: Diagnosis not present

## 2021-09-24 DIAGNOSIS — M25661 Stiffness of right knee, not elsewhere classified: Secondary | ICD-10-CM

## 2021-09-24 NOTE — Therapy (Signed)
[place] ?[facility] ?[address] ?[place], [place], [address] ?Phone: [phone]   Fax:  [phone] ? ?Physical Therapy Treatment ? ?Patient Details  ?Name: Brenda Solis ?MRN: [id number] ?Date of Birth: 1934-09-12 ?Referring Provider (PT): Gaynelle Arabian MD ? ? ?Encounter Date: 09/24/2021 ? ? PT End of Session - 09/24/21 1609   ? ? Visit Number 9   ? Number of Visits 14   ? Date for PT Re-Evaluation 10/22/21   ? PT Start Time 1600   ? PT Stop Time 3007   ? PT Time Calculation (min) 50 min   ? ?  ?  ? ?  ? ? ?Past Medical History:  ?Diagnosis Date  ? Anemia   ? Anxiety   ? Depression   ? Heart murmur   ? Hyperlipidemia   ? Hypertension   ? Hypothyroidism   ? Insomnia, unspecified   ? Pneumonia   ? Spondylosis of unspecified site without mention of myelopathy   ? Thyroid disease   ? hypothyroidism  ? ? ?Past Surgical History:  ?Procedure Laterality Date  ? APPENDECTOMY    ? CATARACT EXTRACTION, BILATERAL Bilateral 2006  ? [place] SURGERY  1999  ? [place]  ? CHOLECYSTECTOMY  1990  ? DECOMPRESSIVE LUMBAR LAMINECTOMY LEVEL 1 N/A 02/01/2013  ? Procedure: CENTRAL DECOMPRESSIVE LUMBAR LAMINECTOMY LEVEL 1 L4-L5;  Surgeon: Tobi Bastos, MD;  [facility];  Service: Orthopedics;  Laterality: N/A;  ? DECOMPRESSIVE LUMBAR LAMINECTOMY LEVEL 1 N/A 11/20/2014  ? Procedure: REVISION L4-S1 DECOMPRESSION, L4-L5 IN SITU FUSION (LEVEL 1);  Surgeon: Melina Schools, MD;  Location: [place];  Service: Orthopedics;  Laterality: N/A;  ? KNEE SURGERY Right 2009  ? meniscus repair  ? SPINAL CORD STIMULATOR INSERTION N/A 06/25/2020  ? Procedure: SPINAL CORD STIMULATOR INSERTION;  Surgeon: Melina Schools, MD;  Location: [place];  Service: Orthopedics;  Laterality: N/A;  3 hrs  ? TOTAL HIP ARTHROPLASTY  2011  ? RIGHT  ? TOTAL KNEE ARTHROPLASTY Right 08/24/2021  ? Procedure: TOTAL KNEE ARTHROPLASTY;  Surgeon: Gaynelle Arabian, MD;  [facility];  Service: Orthopedics;  Laterality:  Right;  ? ? ?There were no vitals filed for this visit. ? ? Subjective Assessment - 09/24/21 1608   ? ? Subjective Right low back still better.  Back pain is at a 6/10 with spasms. RT knee   ? Pertinent History HTN, hypothyroidism, cervical and lumbar surgery, OP (per patient).   ? How long can you stand comfortably? ~5 minutes.   ? How long can you walk comfortably? Short distance around home with a FWW.   ? Patient Stated Goals Get better with less knee pain.   ? Currently in Pain? Yes   ? Pain Score 4    ? Pain Location Knee   ? Pain Orientation Right   ? Pain Descriptors / Indicators Aching;Sore   ? Pain Type Chronic pain   ? Pain Onset 1 to 4 weeks ago   ? ?  ?  ? ?  ? ? ? ? ? ? ? ? ? ? ? ? ? ? ? ? ? ? ? ? [place] Adult PT Treatment/Exercise - 09/24/21 0001   ? ?  ? Exercises  ? Exercises Knee/Hip   ?  ? Knee/Hip Exercises: Aerobic  ? Nustep Level 4 x 15 minutes moving seat forward x 2.   ?  ? Knee/Hip Exercises: Standing  ? Rocker Board 4 minutes   ?  ?  Knee/Hip Exercises: Supine  ? Short Arc [employer] Strengthening;Right;3 sets;10 reps   hold  ? Short Arc [employer] Limitations 5# 3x10 hold 5 secs   ?  ? Vasopneumatic  ? Number Minutes Vasopneumatic  15 minutes   ? Vasopnuematic Location  Shoulder   ? Vasopneumatic Pressure Medium   ? Vasopneumatic Temperature  34   ?  ? Manual Therapy  ? Manual Therapy Passive ROM   ? Passive ROM In supine: STW to posterior aspect and  PROM with focus on right knee extensionwith low load long duration stretching with Pt performing quad sets   ? ?  ?  ? ?  ? ? ? ? ? ? ? ? ? ? ? ? ? ? ? PT Long Term Goals - 08/27/21 1802   ? ?  ? PT LONG TERM GOAL #1  ? Title Independent with a HEP.   ? Time 4   ? Period Weeks   ? Status New   ?  ? PT LONG TERM GOAL #2  ? Title Full active right knee extension in order to normalize gait.   ? Time 4   ? Period Weeks   ? Status New   ?  ? PT LONG TERM GOAL #3  ? Title Active knee flexion to 115 degrees+ so the patient can perform functional tasks  and do so with pain not > 2-3/10.   ? Time 4   ? Period Weeks   ? Status New   ?  ? PT LONG TERM GOAL #4  ? Title Increase right hip and  knee strength to a solid 4+/5 to provide good stability for accomplishment of functional activities.   ? Time 4   ? Period Weeks   ? Status New   ?  ? PT LONG TERM GOAL #5  ? Title Perform a reciprocating stair gait with one railing with pain not > 2-3/10.   ? Time 4   ? Period Weeks   ? Status New   ? ?  ?  ? ?  ? ? ? ? ? ? ? ? Plan - 09/24/21 1610   ? ? Clinical Impression Statement Pt arrived today doing very well with the RT knee, but c/o RT LBP still. Rx focused on ROM and quad strengthening as tolerated. Some exs avoided due to LB spasms. Vaso end of session with normal response.   ? Personal Factors and Comorbidities Comorbidity 1;Other   ? Comorbidities HTN, hypothyroidism, cervical and lumbar surgery, OP (per patient).   ? Examination-Activity Limitations Other;Dressing;Bathing;Locomotion Level;Stand   ? Examination-Participation Restrictions Other   ? Stability/Clinical Decision Making Stable/Uncomplicated   ? Rehab Potential Excellent   ? PT Frequency 2x / week   ? PT Duration 4 weeks   ? PT Treatment/Interventions ADLs/Self Care Home Management;Cryotherapy;Electrical Stimulation;Moist Heat;Gait training;Stair training;Functional mobility training;Therapeutic activities;Therapeutic exercise;Neuromuscular re-education;Manual techniques;Patient/family education;Passive range of motion;Vasopneumatic Device   ? PT Next Visit Plan Progress to recumbent bike.   ? Consulted and Agree with Plan of Care Patient   ? ?  ?  ? ?  ? ? ?Patient will benefit from skilled therapeutic intervention in order to improve the following deficits and impairments:  Abnormal gait, Difficulty walking, Decreased activity tolerance, Decreased strength, Decreased range of motion, Increased edema, Pain ? ?Visit Diagnosis: ?Chronic pain of right knee ? ?Stiffness of right knee, not elsewhere  classified ? ?Localized edema ? ?Muscle weakness (generalized) ? ? ? ? ?Problem List ?Patient Active Problem  List  ? Diagnosis Date Noted  ? Primary osteoarthritis of right knee 08/24/2021  ? Cough 07/06/2021  ? Osteoarthritis of right knee 05/18/2021  ? Chronic pain 06/25/2020  ? Obsessional thoughts   ? Mood disorder ([place])   ? Osteopenia 04/24/2014  ? Hyperlipidemia with target LDL less than 100 02/01/2014  ? Depression 02/01/2014  ? Spinal stenosis, lumbar region, with neurogenic claudication 02/01/2013  ? Hypertension 11/17/2010  ? Hypothyroidism 10/14/2010  ? Insomnia 10/14/2010  ? Gastritis 10/14/2010  ? ? ?Melitta Tigue,CHRIS, PTA ?09/24/2021, 5:54 PM ? ?[place] ?[facility] ?[address] ?[place], [place], [address] ?Phone: [phone]   Fax:  [phone] ? ?Name: Brenda Solis ?MRN: [id number] ?Date of Birth: 1935-06-26 ? ? ? ?

## 2021-09-29 ENCOUNTER — Encounter: Payer: Medicare Other | Admitting: *Deleted

## 2021-09-29 DIAGNOSIS — Z5189 Encounter for other specified aftercare: Secondary | ICD-10-CM | POA: Diagnosis not present

## 2021-10-01 ENCOUNTER — Ambulatory Visit: Payer: Medicare Other | Attending: Orthopedic Surgery | Admitting: Physical Therapy

## 2021-10-01 DIAGNOSIS — M25661 Stiffness of right knee, not elsewhere classified: Secondary | ICD-10-CM | POA: Diagnosis not present

## 2021-10-01 DIAGNOSIS — G8929 Other chronic pain: Secondary | ICD-10-CM | POA: Diagnosis not present

## 2021-10-01 DIAGNOSIS — M25561 Pain in right knee: Secondary | ICD-10-CM | POA: Insufficient documentation

## 2021-10-01 DIAGNOSIS — R600 Localized edema: Secondary | ICD-10-CM | POA: Diagnosis not present

## 2021-10-01 NOTE — Patient Instructions (Signed)
[place] ?Created by [place] Eneida Evers Apr 6th, 2023 ?View at www.my-exercise-code.com using code: [id number] ?Total 1 Page 1 of 1 ?PEANUT BALL - SHORT ARC QUAD - SAQ ?Start by placing a peanut ball (rolled up pillow) under your knee. ?Next, slowly raise your lower leg and foot as you straighten your ?knee. Return to starting position and repeat. ?Repeat 25 Times Hold 3 Seconds ?Complete 2 Sets Perfo ?

## 2021-10-01 NOTE — Therapy (Signed)
[facility] ?[place] ?[place], [place], [address] ?Phone: [phone]   Fax:  [phone] ? ?Physical Therapy Treatment ? ?Patient Details  ?Name: Brenda Solis ?MRN: [id number] ?Date of Birth: August 18, 1934 ?Referring Provider (PT): Gaynelle Arabian MD ? ? ?Encounter Date: 10/01/2021 ? ? PT End of Session - 10/01/21 1706   ? ? Visit Number 10   ? Number of Visits 14   ? Date for PT Re-Evaluation 10/22/21   ? PT Start Time 0410   ? PT Stop Time 0459   ? PT Time Calculation (min) 49 min   ? Activity Tolerance Patient tolerated treatment well   ? Behavior During Therapy [facility] for tasks assessed/performed   ? ?  ?  ? ?  ? ? ?Past Medical History:  ?Diagnosis Date  ? Anemia   ? Anxiety   ? Depression   ? Heart murmur   ? Hyperlipidemia   ? Hypertension   ? Hypothyroidism   ? Insomnia, unspecified   ? Pneumonia   ? Spondylosis of unspecified site without mention of myelopathy   ? Thyroid disease   ? hypothyroidism  ? ? ?Past Surgical History:  ?Procedure Laterality Date  ? APPENDECTOMY    ? CATARACT EXTRACTION, BILATERAL Bilateral 2006  ? [place] SURGERY  1999  ? [place]  ? CHOLECYSTECTOMY  1990  ? DECOMPRESSIVE LUMBAR LAMINECTOMY LEVEL 1 N/A 02/01/2013  ? Procedure: CENTRAL DECOMPRESSIVE LUMBAR LAMINECTOMY LEVEL 1 L4-L5;  Surgeon: Tobi Bastos, MD;  Location: [facility];  Service: Orthopedics;  Laterality: N/A;  ? DECOMPRESSIVE LUMBAR LAMINECTOMY LEVEL 1 N/A 11/20/2014  ? Procedure: REVISION L4-S1 DECOMPRESSION, L4-L5 IN SITU FUSION (LEVEL 1);  Surgeon: Melina Schools, MD;  Location: [facility];  Service: Orthopedics;  Laterality: N/A;  ? KNEE SURGERY Right 2009  ? meniscus repair  ? SPINAL CORD STIMULATOR INSERTION N/A 06/25/2020  ? Procedure: SPINAL CORD STIMULATOR INSERTION;  Surgeon: Melina Schools, MD;  Location: [facility];  Service: Orthopedics;  Laterality: N/A;  3 hrs  ? TOTAL HIP ARTHROPLASTY  2011  ? RIGHT  ? TOTAL KNEE ARTHROPLASTY Right 08/24/2021  ?  Procedure: TOTAL KNEE ARTHROPLASTY;  Surgeon: Gaynelle Arabian, MD;  Location: [facility];  Service: Orthopedics;  Laterality: Right;  ? ? ?There were no vitals filed for this visit. ? ? ? ? ? ? ? ? ? ? ? ? ? ? ? ? ? ? ? ? ? [place] Adult PT Treatment/Exercise - 10/01/21 0001   ? ?  ? Exercises  ? Exercises Knee/Hip   ?  ? Knee/Hip Exercises: Aerobic  ? Nustep Level 4 x 15 minutes.   ?  ? Knee/Hip Exercises: Supine  ? Short Arc [employer] Limitations 3# x 5 minutes with 3 sec holds.   ?  ? Modalities  ? Modalities Vasopneumatic   ?  ? Vasopneumatic  ? Number Minutes Vasopneumatic  15 minutes   ? Vasopnuematic Location  --   Right knee.  ? Vasopneumatic Pressure Low   ?  ? Manual Therapy  ? Manual Therapy Passive ROM   ? Passive ROM Sustained right knee extension stretch x 3 minutes.   ? ?  ?  ? ?  ? ? ? ? ? ? ? ? ? ? ? ? ? ? ? PT Long Term Goals - 10/01/21 1628   ? ?  ? PT LONG TERM GOAL #1  ? Title Independent with a HEP.   ? Time  4   ? Period Weeks   ? Status Achieved   ?  ? PT LONG TERM GOAL #2  ? Title Full active right knee extension in order to normalize gait.   ? Baseline -10 degrees.   ? Time 4   ? Period Weeks   ? Status Partially Met   ?  ? PT LONG TERM GOAL #3  ? Title Active knee flexion to 115 degrees+ so the patient can perform functional tasks and do so with pain not > 2-3/10.   ? Baseline 120 degrees.   ? Time 4   ? Period Weeks   ? Status Achieved   ?  ? PT LONG TERM GOAL #4  ? Title Increase right hip and  knee strength to a solid 4+/5 to provide good stability for accomplishment of functional activities.   ? Time 4   ? Period Weeks   ? Status On-going   ?  ? PT LONG TERM GOAL #5  ? Title Perform a reciprocating stair gait with one railing with pain not > 2-3/10.   ? Time 4   ? Period Weeks   ? Status Not Met   ? ?  ?  ? ?  ? ? ? ? ? ? ? ? Plan - 10/01/21 1707   ? ? Clinical Impression Statement See "Therapy Note" section.   ? Personal Factors and Comorbidities Comorbidity 1;Other   ? Comorbidities HTN,  hypothyroidism, cervical and lumbar surgery, OP (per patient).   ? Examination-Activity Limitations Other;Dressing;Bathing;Locomotion Level;Stand   ? Examination-Participation Restrictions Other   ? Rehab Potential Excellent   ? PT Frequency 2x / week   ? PT Duration 4 weeks   ? PT Treatment/Interventions ADLs/Self Care Home Management;Cryotherapy;Electrical Stimulation;Moist Heat;Gait training;Stair training;Functional mobility training;Therapeutic activities;Therapeutic exercise;Neuromuscular re-education;Manual techniques;Patient/family education;Passive range of motion;Vasopneumatic Device   ? PT Next Visit Plan Progress to recumbent bike.   ? Consulted and Agree with Plan of Care Patient   ? ?  ?  ? ?  ? ? ?Patient will benefit from skilled therapeutic intervention in order to improve the following deficits and impairments:  Abnormal gait, Difficulty walking, Decreased activity tolerance, Decreased strength, Decreased range of motion, Increased edema, Pain ? ?Visit Diagnosis: ?Chronic pain of right knee ? ?Stiffness of right knee, not elsewhere classified ? ?Localized edema ? ? ? ? ?Problem List ?Patient Active Problem List  ? Diagnosis Date Noted  ? Primary osteoarthritis of right knee 08/24/2021  ? Cough 07/06/2021  ? Osteoarthritis of right knee 05/18/2021  ? Chronic pain 06/25/2020  ? Obsessional thoughts   ? Mood disorder ([place])   ? Osteopenia 04/24/2014  ? Hyperlipidemia with target LDL less than 100 02/01/2014  ? Depression 02/01/2014  ? Spinal stenosis, lumbar region, with neurogenic claudication 02/01/2013  ? Hypertension 11/17/2010  ? Hypothyroidism 10/14/2010  ? Insomnia 10/14/2010  ? Gastritis 10/14/2010  ? ?PHYSICAL THERAPY DISCHARGE SUMMARY ? ?Visits from Start of Care: 10. ? ?Current functional level related to goals / functional outcomes: ?See above. ?  ?Remaining deficits: ?Doctor pleased with patient's progress.  Recommending discharge at this time and hopes this may help decrease her back  pain.  See goal section. ?  ?Education / Equipment: ?HEP.  ? ?Patient agrees to discharge. Patient goals were partially met. Patient is being discharged due to the patient's request. ? ?, [place], PT ?10/01/2021, 5:08 PM ? ?[facility] [facility] ?[facility] ?[address] ?[place], [place], [address] ?Phone: [phone]   Fax:  [phone] ? ?Name: Brenda Solis ?MRN: [id number] ?Date of Birth: 11/20/1934 ? ? ? ?

## 2021-10-06 ENCOUNTER — Ambulatory Visit: Payer: Medicare Other | Admitting: Physical Therapy

## 2021-10-08 ENCOUNTER — Ambulatory Visit: Payer: Medicare Other | Admitting: Physical Therapy

## 2021-10-12 ENCOUNTER — Telehealth: Payer: Self-pay | Admitting: Family Medicine

## 2021-10-12 NOTE — Telephone Encounter (Signed)
Dr g gave verbal. Letter written, printed and given to LAT ?

## 2021-10-13 ENCOUNTER — Encounter: Payer: Medicare Other | Admitting: Physical Therapy

## 2021-10-15 ENCOUNTER — Encounter: Payer: Medicare Other | Admitting: Physical Therapy

## 2021-10-18 ENCOUNTER — Other Ambulatory Visit: Payer: Self-pay | Admitting: Family Medicine

## 2021-10-18 DIAGNOSIS — E039 Hypothyroidism, unspecified: Secondary | ICD-10-CM

## 2021-11-14 ENCOUNTER — Other Ambulatory Visit: Payer: Self-pay | Admitting: Family Medicine

## 2021-11-14 DIAGNOSIS — M48062 Spinal stenosis, lumbar region with neurogenic claudication: Secondary | ICD-10-CM

## 2021-11-14 DIAGNOSIS — F419 Anxiety disorder, unspecified: Secondary | ICD-10-CM

## 2021-11-14 DIAGNOSIS — F32A Depression, unspecified: Secondary | ICD-10-CM

## 2021-12-11 ENCOUNTER — Other Ambulatory Visit: Payer: Self-pay | Admitting: Family Medicine

## 2021-12-11 DIAGNOSIS — F419 Anxiety disorder, unspecified: Secondary | ICD-10-CM

## 2021-12-11 DIAGNOSIS — M48062 Spinal stenosis, lumbar region with neurogenic claudication: Secondary | ICD-10-CM

## 2021-12-11 DIAGNOSIS — F32A Depression, unspecified: Secondary | ICD-10-CM

## 2021-12-15 ENCOUNTER — Other Ambulatory Visit: Payer: Self-pay | Admitting: Family Medicine

## 2021-12-15 DIAGNOSIS — F32A Depression, unspecified: Secondary | ICD-10-CM

## 2021-12-15 NOTE — Telephone Encounter (Signed)
[place]. NTBS 30 days given 11/16/21

## 2022-01-06 ENCOUNTER — Ambulatory Visit (INDEPENDENT_AMBULATORY_CARE_PROVIDER_SITE_OTHER): Payer: Medicare Other | Admitting: Family Medicine

## 2022-01-06 ENCOUNTER — Encounter: Payer: Self-pay | Admitting: Family Medicine

## 2022-01-06 VITALS — Temp 97.6°F | Ht 68.0 in | Wt 188.8 lb

## 2022-01-06 DIAGNOSIS — M48062 Spinal stenosis, lumbar region with neurogenic claudication: Secondary | ICD-10-CM

## 2022-01-06 DIAGNOSIS — E039 Hypothyroidism, unspecified: Secondary | ICD-10-CM | POA: Diagnosis not present

## 2022-01-06 DIAGNOSIS — F419 Anxiety disorder, unspecified: Secondary | ICD-10-CM | POA: Diagnosis not present

## 2022-01-06 DIAGNOSIS — F32A Depression, unspecified: Secondary | ICD-10-CM | POA: Diagnosis not present

## 2022-01-06 DIAGNOSIS — I951 Orthostatic hypotension: Secondary | ICD-10-CM

## 2022-01-06 MED ORDER — BUPROPION HCL ER (XL) 300 MG PO TB24: 300.0000 mg | ORAL_TABLET | Freq: Every day | ORAL | 3 refills | Status: DC

## 2022-01-06 MED ORDER — LEVOTHYROXINE SODIUM 75 MCG PO TABS: ORAL_TABLET | ORAL | 3 refills | Status: DC

## 2022-01-06 MED ORDER — MECLIZINE HCL 25 MG PO TABS: ORAL_TABLET | ORAL | 2 refills | Status: AC

## 2022-01-06 MED ORDER — DULOXETINE HCL 60 MG PO CPEP: 60.0000 mg | ORAL_CAPSULE | Freq: Every day | ORAL | 3 refills | Status: DC

## 2022-01-06 NOTE — Progress Notes (Addendum)
Subjective: CC: Follow-up thyroid PCP: [name], DO FBX:UXYB CEARA WRIGHTSON is a 87 y.o. female presenting to clinic today for:  1.  Hypothyroidism Patient is compliant with Synthroid.  No difficulty swallowing, change in voice, tremor, heart palpitations, change in bowel habits  2.  Chronic low back pain with known DDD and spinal stenosis/depression Patient is treated by Dr. Nelva Bush with back injections.  She notes these historically have not been especially helpful.  She has a spinal stimulator in place but again not especially helpful.  She does have radicular symptoms but is not a good candidate for surgery so she essentially is being treated with pain medication (which she does not take because she does not want to be addicted to an opioid), Robaxin (which she does use more often) and Tylenol.  She is also treated with Cymbalta 30 mg.  She would be glad to go up on that if this would help with her back.  She reports some depressive symptoms but nothing severe.  Compliant with Wellbutrin  3.  Dizziness Patient reports occasional dizziness with positional changes.  She does not really monitor her blood pressures but does have a history of hypertension that was previously treated with medication.  This was discontinued because it was precipitating orthostasis in the past.  She had her blood pressure checked recently by her son but cannot quite remember what the reading was.  She thinks it was normal.  She specifically finds herself to be dizzy when she gets up from a seated position.  No losses of consciousness.  No edema.  No chest pain, visual disturbance.  ROS: Per HPI  Allergies  Allergen Reactions   Gabapentin Other (See Comments)    Dizzy   Oxycodone Itching   Past Medical History:  Diagnosis Date   Anemia    Anxiety    Depression    Heart murmur    Hyperlipidemia    Hypertension    Hypothyroidism    Insomnia, unspecified    Pneumonia    Spondylosis of unspecified site  without mention of myelopathy    Thyroid disease    hypothyroidism    Current Outpatient Medications:    buPROPion (WELLBUTRIN XL) 300 MG 24 hr tablet, Take 1 tablet (300 mg total) by mouth daily. (NEEDS TO BE SEEN BEFORE NEXT REFILL), Disp: 30 tablet, Rfl: 0   Cholecalciferol (VITAMIN D3 PO), Take 1 tablet by mouth daily., Disp: , Rfl:    DULoxetine (CYMBALTA) 30 MG capsule, Take 1 capsule (30 mg total) by mouth daily. (NEEDS TO BE SEEN BEFORE NEXT REFILL), Disp: 30 capsule, Rfl: 0   HYDROmorphone (DILAUDID) 2 MG tablet, Take 1-2 tablets (2-4 mg total) by mouth every 6 (six) hours as needed for severe pain., Disp: 42 tablet, Rfl: 0   levothyroxine (SYNTHROID) 75 MCG tablet, TAKE 1 TABLET BY MOUTH EVERY DAY BEFORE BREAKFAST, Disp: 90 tablet, Rfl: 1   meclizine (ANTIVERT) 25 MG tablet, TAKE ONE TABLET BY MOUTH THREE TIMES DAILY AS NEEDED FOR DIZZINESS, Disp: 30 tablet, Rfl: 2   methocarbamol (ROBAXIN) 500 MG tablet, Take 1 tablet (500 mg total) by mouth every 6 (six) hours as needed for muscle spasms., Disp: 40 tablet, Rfl: 0   Multiple Vitamin (MULTIVITAMIN WITH MINERALS) TABS tablet, Take 1 tablet by mouth daily., Disp: , Rfl:    nystatin cream (MYCOSTATIN), Apply 1 application topically 2 (two) times daily as needed (rash)., Disp: 30 g, Rfl: 2 Social History   Socioeconomic History  Marital status: Widowed    Spouse name: Not on file   Number of children: 3   Years of education: Not on file   Highest education level: 12th grade  Occupational History    Employer: RETIRED    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  Tobacco Use   Smoking status: Never   Smokeless tobacco: Never  Vaping Use   Vaping Use: Never used  Substance and Sexual Activity   Alcohol use: No    Alcohol/week: 0.0 standard drinks of alcohol   Drug use: No   Sexual activity: Not Currently  Other Topics Concern   Not on file  Social History Narrative   Not on file   Social Determinants of Health   Financial  Resource Strain: Low Risk  (08/31/2021)   Overall Financial Resource Strain (CARDIA)    Difficulty of Paying Living Expenses: Not very hard  Food Insecurity: No Food Insecurity (08/31/2021)   Hunger Vital Sign    Worried About Running Out of Food in the Last Year: Never true    Ran Out of Food in the Last Year: Never true  Transportation Needs: No Transportation Needs (08/31/2021)   PRAPARE - Hydrologist (Medical): No    Lack of Transportation (Non-Medical): No  Physical Activity: Insufficiently Active (08/31/2021)   Exercise Vital Sign    Days of Exercise per Week: 2 days    Minutes of Exercise per Session: 60 min  Stress: No Stress Concern Present (08/31/2021)   [place]    Feeling of Stress : Not at all  Social Connections: Moderately Integrated (08/31/2021)   Social Connection and Isolation Panel [NHANES]    Frequency of Communication with Friends and Family: More than three times a week    Frequency of Social Gatherings with Friends and Family: More than three times a week    Attends Religious Services: More than 4 times per year    Active Member of [employer] or Organizations: Yes    Attends Archivist Meetings: More than 4 times per year    Marital Status: Widowed  Intimate Partner Violence: Not At Risk (08/31/2021)   Humiliation, Afraid, Rape, and Kick questionnaire    Fear of Current or Ex-Partner: No    Emotionally Abused: No    Physically Abused: No    Sexually Abused: No   Family History  Problem Relation Age of Onset   Coronary artery disease Brother        21s   Heart disease Brother    Cancer Mother        breast   Osteoporosis Mother    Heart disease Mother    Osteoporosis Maternal Aunt    Heart disease Father    Stroke Father    Esophageal varices Brother    Stroke Other        FAMILY   Esophageal varices Other        FAMILY    Objective: Office vital signs  reviewed. Temp 97.6 F (36.4 C)   Ht $R'5\' 8"'EP$  (1.727 m)   Wt 188 lb 12.8 oz (85.6 kg)   SpO2 93%   BMI 28.71 kg/m   Physical Examination:  General: Awake, alert, well nourished, No acute distress HEENT: Sclera white.  No exophthalmos.  No goiter Cardio: regular rate and rhythm, S1S2 heard, no murmurs appreciated Pulm: clear to auscultation bilaterally, no wheezes, rhonchi or rales; normal work of breathing on room air  Extremities: warm, well perfused, No edema, cyanosis or clubbing; +2 pulses bilaterally MSK: Antalgic gait and station.  Utilizing cane for ambulation. Psych: Mood stable, speech normal.  Very pleasant, interactive     01/06/2022    3:12 PM 08/31/2021    9:08 AM 08/03/2021    2:36 PM  Depression screen PHQ 2/9  Decreased Interest 0 0 0  Down, Depressed, Hopeless 0 0 0  PHQ - 2 Score 0 0 0  Altered sleeping  0 0  Tired, decreased energy  0 0  Change in appetite  0 0  Feeling bad or failure about yourself   0 0  Trouble concentrating  0 0  Moving slowly or fidgety/restless  0 0  Suicidal thoughts  0 0  PHQ-9 Score  0 0  Difficult doing work/chores  Not difficult at all Not difficult at all      01/06/2022    3:13 PM 08/03/2021    2:36 PM 07/24/2021    9:50 AM 06/04/2021    2:58 PM  GAD 7 : Generalized Anxiety Score  Nervous, Anxious, on Edge 0 0 0 0  Control/stop worrying 0 0 0 0  Worry too much - different things 0 0 0 0  Trouble relaxing 0 0 0 0  Restless 0 0 0 0  Easily annoyed or irritable 0 0 0 0  Afraid - awful might happen 0 0 0 0  Total GAD 7 Score 0 0 0 0  Anxiety Difficulty Not difficult at all Not difficult at all  Not difficult at all   Orthostatic VS for the past 72 hrs (Last 3 readings):  Orthostatic BP Orthostatic Pulse  01/06/22 1503 146/68 96  01/06/22 1502 150/63 89  01/06/22 1454 183/76 90    Assessment/ Plan: 87 y.o. female   Spinal stenosis, lumbar region, with neurogenic claudication - Plan: DULoxetine (CYMBALTA) 60 MG  capsule  Depression, unspecified depression type - Plan: buPROPion (WELLBUTRIN XL) 300 MG 24 hr tablet, DULoxetine (CYMBALTA) 60 MG capsule  Anxiety - Plan: DULoxetine (CYMBALTA) 60 MG capsule  Acquired hypothyroidism - Plan: levothyroxine (SYNTHROID) 75 MCG tablet, TSH, T4, Free, CMP14+EGFR  Orthostatic hypotension  Continues to have quite a bit of difficulty with her spinal stenosis.  She is prescribed narcotics by her specialist but she is very reluctant to utilize these because of the addictive nature of the medication.  I am going to advance her Cymbalta to 60 mg in efforts to alleviate both some back issues as well as depressive symptoms.  Continue the Wellbutrin 300 mg.  Check thyroid levels.  Synthroid renewed  Would like her to follow-up in the next 3 to 4 months for both mood and thyroid check.  Orthostatic hypotension certainly noted from both the lying to seated positions.  She has a 30 point drop from lying to sitting and her systolic blood pressure.  While her initial blood pressure reading is quite elevated I hesitate to add any blood pressure medications given the significant drop that is noted, particularly between lying and standing positions.  Her blood pressure is normotensive for age at standing position and I would worry about precipitating a hypotension with addition of blood pressure medicine.  For this reason she will monitor blood pressures at home and record.  I would like her to return in 2 weeks with these readings so that we can make a better educated decision as to whether or not to initiate any antihypertensives.  No orders of the defined types  were placed in this encounter.  No orders of the defined types were placed in this encounter.    [name], DO [place] [phone]

## 2022-01-07 LAB — T4, FREE: Free T4: 1.18 ng/dL (ref 0.82–1.77)

## 2022-01-07 LAB — CMP14+EGFR
ALT: 14 IU/L (ref 0–32)
AST: 19 IU/L (ref 0–40)
Albumin/Globulin Ratio: 1.6 (ref 1.2–2.2)
Albumin: 4.1 g/dL (ref 3.7–4.7)
Alkaline Phosphatase: 79 IU/L (ref 44–121)
BUN/Creatinine Ratio: 15 (ref 12–28)
BUN: 18 mg/dL (ref 8–27)
Bilirubin Total: 0.3 mg/dL (ref 0.0–1.2)
CO2: 19 mmol/L — ABNORMAL LOW (ref 20–29)
Calcium: 9.6 mg/dL (ref 8.7–10.3)
Chloride: 103 mmol/L (ref 96–106)
Creatinine, Ser: 1.17 mg/dL — ABNORMAL HIGH (ref 0.57–1.00)
Globulin, Total: 2.5 g/dL (ref 1.5–4.5)
Glucose: 129 mg/dL — ABNORMAL HIGH (ref 70–99)
Potassium: 4.6 mmol/L (ref 3.5–5.2)
Sodium: 137 mmol/L (ref 134–144)
Total Protein: 6.6 g/dL (ref 6.0–8.5)
eGFR: 46 mL/min/{1.73_m2} — ABNORMAL LOW (ref >59)

## 2022-01-07 LAB — TSH: TSH: 4.13 u[IU]/mL (ref 0.450–4.500)

## 2022-01-14 ENCOUNTER — Telehealth: Payer: Self-pay | Admitting: Family Medicine

## 2022-01-15 ENCOUNTER — Other Ambulatory Visit: Payer: Self-pay | Admitting: Family Medicine

## 2022-01-15 MED ORDER — METHOCARBAMOL 500 MG PO TABS
500.0000 mg | ORAL_TABLET | Freq: Three times a day (TID) | ORAL | 0 refills | Status: DC | PRN
Start: 2022-01-15 — End: 2022-09-03

## 2022-01-15 NOTE — Telephone Encounter (Signed)
sent 

## 2022-01-15 NOTE — Telephone Encounter (Signed)
Daughter aware.

## 2022-01-15 NOTE — Telephone Encounter (Signed)
Pt is out of those from having her surgery. Is requesting refill

## 2022-01-15 NOTE — Telephone Encounter (Signed)
Her orthopedist sent in [place] in 07/2021.  Does she not have this one anymore?  I thought she told me she did at last visit

## 2022-01-22 ENCOUNTER — Telehealth: Payer: Self-pay | Admitting: Family Medicine

## 2022-01-22 NOTE — Telephone Encounter (Signed)
Pt scheduled for office evaluation

## 2022-01-22 NOTE — Telephone Encounter (Signed)
I reviewed her blood pressure readings, all of which are normal range.  Not sure why she is feeling weak and lightheaded.  It certainly does not appear to be related to any low or high blood pressures.  May benefit from an office evaluation

## 2022-01-25 ENCOUNTER — Encounter: Payer: Self-pay | Admitting: Family Medicine

## 2022-01-25 ENCOUNTER — Ambulatory Visit (INDEPENDENT_AMBULATORY_CARE_PROVIDER_SITE_OTHER): Payer: Medicare Other | Admitting: Family Medicine

## 2022-01-25 VITALS — BP 144/63 | HR 70 | Temp 98.0°F | Ht 68.0 in | Wt 189.8 lb

## 2022-01-25 DIAGNOSIS — R42 Dizziness and giddiness: Secondary | ICD-10-CM

## 2022-01-25 DIAGNOSIS — R799 Abnormal finding of blood chemistry, unspecified: Secondary | ICD-10-CM | POA: Diagnosis not present

## 2022-01-25 LAB — URINALYSIS
Bilirubin, UA: NEGATIVE
Glucose, UA: NEGATIVE
Ketones, UA: NEGATIVE
Leukocytes,UA: NEGATIVE
Nitrite, UA: NEGATIVE
Protein,UA: NEGATIVE
Specific Gravity, UA: 1.015 (ref 1.005–1.030)
Urobilinogen, Ur: 0.2 mg/dL (ref 0.2–1.0)
pH, UA: 7 (ref 5.0–7.5)

## 2022-01-25 NOTE — Progress Notes (Signed)
Subjective:  Patient ID: Brenda Solis, female    DOB: September 03, 1934  Age: 87 y.o. MRN: [id number]  CC: Dizziness and Fatigue   HPI KAMALEI ROEDER presents for light headedness for 1-2 months, then feels weak all over. Can occur with standing from seated, but sometimes not positional. Does not occur when seated or laying. Some HA with the spells. They last 2-5 minutes. States clearly that she doesn't drink enough water. Recent BMP  shows BUN/Cr >10/1, but not 20/1.      01/06/2022    3:12 PM 08/31/2021    9:08 AM 08/03/2021    2:36 PM  Depression screen PHQ 2/9  Decreased Interest 0 0 0  Down, Depressed, Hopeless 0 0 0  PHQ - 2 Score 0 0 0  Altered sleeping  0 0  Tired, decreased energy  0 0  Change in appetite  0 0  Feeling bad or failure about yourself   0 0  Trouble concentrating  0 0  Moving slowly or fidgety/restless  0 0  Suicidal thoughts  0 0  PHQ-9 Score  0 0  Difficult doing work/chores  Not difficult at all Not difficult at all    History Brenda Solis has a past medical history of Anemia, Anxiety, Depression, Heart murmur, Hyperlipidemia, Hypertension, Hypothyroidism, Insomnia, unspecified, Pneumonia, Spondylosis of unspecified site without mention of myelopathy, and Thyroid disease.   She has a past surgical history that includes Total hip arthroplasty (2011); Cataract extraction, bilateral (Bilateral, 2006); Knee surgery (Right, 2009); Cervical disc surgery (1999); Cesarean section (1962 and 1967); Cholecystectomy (1990); Decompressive lumbar laminectomy level 1 (N/A, 02/01/2013); Appendectomy; Decompressive lumbar laminectomy level 1 (N/A, 11/20/2014); Spinal cord stimulator insertion (N/A, 06/25/2020); and Total knee arthroplasty (Right, 08/24/2021).   Her family history includes Cancer in her mother; Coronary artery disease in her brother; Esophageal varices in her brother and another family member; Heart disease in her brother, father, and mother; Osteoporosis in her maternal aunt and  mother; Stroke in her father and another family member.She reports that she has never smoked. She has never used smokeless tobacco. She reports that she does not drink alcohol and does not use drugs.    ROS Review of Systems  Constitutional: Negative.   HENT: Negative.    Eyes:  Negative for visual disturbance.  Respiratory:  Negative for shortness of breath.   Cardiovascular:  Negative for chest pain.  Gastrointestinal:  Negative for abdominal pain.  Musculoskeletal:  Negative for arthralgias.    Objective:  BP (!) 144/63   Pulse 70   Temp 98 F (36.7 C)   Ht 5' 8"  (1.727 m)   Wt 189 lb 12.8 oz (86.1 kg)   SpO2 96%   BMI 28.86 kg/m   BP Readings from Last 3 Encounters:  01/25/22 (!) 144/63  08/25/21 (!) 148/68  08/11/21 (!) 159/63    Wt Readings from Last 3 Encounters:  01/25/22 189 lb 12.8 oz (86.1 kg)  01/06/22 188 lb 12.8 oz (85.6 kg)  08/31/21 185 lb (83.9 kg)     Physical Exam Constitutional:      General: She is not in acute distress.    Appearance: She is well-developed.  Cardiovascular:     Rate and Rhythm: Normal rate and regular rhythm.  Pulmonary:     Breath sounds: Normal breath sounds.  Musculoskeletal:        General: No swelling. Normal range of motion.  Skin:    General: Skin is warm and dry.  Neurological:  Mental Status: She is alert and oriented to person, place, and time.       Assessment & Plan:   Brenda Solis was seen today for dizziness and fatigue.  Diagnoses and all orders for this visit:  Dizziness -     EKG 12-Lead -     CBC with Differential/Platelet -     CMP14+EGFR -     Urinalysis       I have discontinued Wynonia Musty. Spath's HYDROmorphone. I am also having her maintain her multivitamin with minerals, Cholecalciferol (VITAMIN D3 PO), nystatin cream, buPROPion, levothyroxine, DULoxetine, meclizine, and methocarbamol.  Allergies as of 01/25/2022       Reactions   Gabapentin Other (See Comments)   Dizzy   Oxycodone  Itching        Medication List        Accurate as of January 25, 2022 12:07 PM. If you have any questions, ask your nurse or doctor.          STOP taking these medications    HYDROmorphone 2 MG tablet Commonly known as: DILAUDID       TAKE these medications    buPROPion 300 MG 24 hr tablet Commonly known as: WELLBUTRIN XL Take 1 tablet (300 mg total) by mouth daily. For depression   DULoxetine 60 MG capsule Commonly known as: Cymbalta Take 1 capsule (60 mg total) by mouth daily. For depression and chronic pain   levothyroxine 75 MCG tablet Commonly known as: SYNTHROID TAKE 1 TABLET BY MOUTH EVERY DAY BEFORE BREAKFAST for thyroid   meclizine 25 MG tablet Commonly known as: ANTIVERT TAKE ONE TABLET BY MOUTH THREE TIMES DAILY AS NEEDED FOR DIZZINESS   methocarbamol 500 MG tablet Commonly known as: ROBAXIN Take 1 tablet (500 mg total) by mouth every 8 (eight) hours as needed for muscle spasms.   multivitamin with minerals Tabs tablet Take 1 tablet by mouth daily.   nystatin cream Commonly known as: MYCOSTATIN Apply 1 application topically 2 (two) times daily as needed (rash).   VITAMIN D3 PO Take 1 tablet by mouth daily.         Follow-up: No follow-ups on file.  Claretta Fraise, M.D.

## 2022-01-26 LAB — CBC WITH DIFFERENTIAL/PLATELET
Basophils Absolute: 0.1 10*3/uL (ref 0.0–0.2)
Basos: 1 %
EOS (ABSOLUTE): 0.3 10*3/uL (ref 0.0–0.4)
Eos: 3 %
Hematocrit: 34.1 % (ref 34.0–46.6)
Hemoglobin: 10.6 g/dL — ABNORMAL LOW (ref 11.1–15.9)
Immature Grans (Abs): 0 10*3/uL (ref 0.0–0.1)
Immature Granulocytes: 0 %
Lymphocytes Absolute: 3.7 10*3/uL — ABNORMAL HIGH (ref 0.7–3.1)
Lymphs: 41 %
MCH: 24.8 pg — ABNORMAL LOW (ref 26.6–33.0)
MCHC: 31.1 g/dL — ABNORMAL LOW (ref 31.5–35.7)
MCV: 80 fL (ref 79–97)
Monocytes Absolute: 0.9 10*3/uL (ref 0.1–0.9)
Monocytes: 10 %
Neutrophils Absolute: 4.1 10*3/uL (ref 1.4–7.0)
Neutrophils: 45 %
Platelets: 274 10*3/uL (ref 150–450)
RBC: 4.28 x10E6/uL (ref 3.77–5.28)
RDW: 15.7 % — ABNORMAL HIGH (ref 11.7–15.4)
WBC: 9 10*3/uL (ref 3.4–10.8)

## 2022-01-26 LAB — CMP14+EGFR
ALT: 17 IU/L (ref 0–32)
AST: 20 IU/L (ref 0–40)
Albumin/Globulin Ratio: 2 (ref 1.2–2.2)
Albumin: 4.5 g/dL (ref 3.7–4.7)
Alkaline Phosphatase: 78 IU/L (ref 44–121)
BUN/Creatinine Ratio: 18 (ref 12–28)
BUN: 22 mg/dL (ref 8–27)
Bilirubin Total: 0.3 mg/dL (ref 0.0–1.2)
CO2: 21 mmol/L (ref 20–29)
Calcium: 10.1 mg/dL (ref 8.7–10.3)
Chloride: 99 mmol/L (ref 96–106)
Creatinine, Ser: 1.22 mg/dL — ABNORMAL HIGH (ref 0.57–1.00)
Globulin, Total: 2.3 g/dL (ref 1.5–4.5)
Glucose: 85 mg/dL (ref 70–99)
Potassium: 4.8 mmol/L (ref 3.5–5.2)
Sodium: 135 mmol/L (ref 134–144)
Total Protein: 6.8 g/dL (ref 6.0–8.5)
eGFR: 43 mL/min/{1.73_m2} — ABNORMAL LOW (ref >59)

## 2022-01-28 LAB — IRON AND TIBC
Iron Saturation: 6 % — CL (ref 15–55)
Iron: 25 ug/dL — ABNORMAL LOW (ref 27–139)
Total Iron Binding Capacity: 446 ug/dL (ref 250–450)
UIBC: 421 ug/dL — ABNORMAL HIGH (ref 118–369)

## 2022-01-28 LAB — FERRITIN: Ferritin: 18 ng/mL (ref 15–150)

## 2022-01-28 LAB — SPECIMEN STATUS REPORT

## 2022-01-29 ENCOUNTER — Ambulatory Visit: Payer: Medicare Other

## 2022-02-02 ENCOUNTER — Other Ambulatory Visit: Payer: Self-pay | Admitting: Family Medicine

## 2022-02-02 DIAGNOSIS — M48062 Spinal stenosis, lumbar region with neurogenic claudication: Secondary | ICD-10-CM

## 2022-02-02 DIAGNOSIS — F419 Anxiety disorder, unspecified: Secondary | ICD-10-CM

## 2022-03-17 DIAGNOSIS — M48062 Spinal stenosis, lumbar region with neurogenic claudication: Secondary | ICD-10-CM | POA: Diagnosis not present

## 2022-03-17 DIAGNOSIS — Z5181 Encounter for therapeutic drug level monitoring: Secondary | ICD-10-CM | POA: Diagnosis not present

## 2022-03-17 DIAGNOSIS — M47896 Other spondylosis, lumbar region: Secondary | ICD-10-CM | POA: Diagnosis not present

## 2022-03-17 DIAGNOSIS — R278 Other lack of coordination: Secondary | ICD-10-CM | POA: Diagnosis not present

## 2022-04-02 ENCOUNTER — Ambulatory Visit: Payer: Medicare Other | Admitting: Family Medicine

## 2022-04-06 DIAGNOSIS — M5416 Radiculopathy, lumbar region: Secondary | ICD-10-CM | POA: Diagnosis not present

## 2022-04-19 DIAGNOSIS — M5416 Radiculopathy, lumbar region: Secondary | ICD-10-CM | POA: Diagnosis not present

## 2022-04-19 DIAGNOSIS — M961 Postlaminectomy syndrome, not elsewhere classified: Secondary | ICD-10-CM | POA: Diagnosis not present

## 2022-05-03 ENCOUNTER — Other Ambulatory Visit: Payer: Self-pay | Admitting: Family Medicine

## 2022-05-03 DIAGNOSIS — R051 Acute cough: Secondary | ICD-10-CM

## 2022-05-05 ENCOUNTER — Encounter: Payer: Self-pay | Admitting: Family Medicine

## 2022-05-05 ENCOUNTER — Ambulatory Visit (INDEPENDENT_AMBULATORY_CARE_PROVIDER_SITE_OTHER): Payer: Medicare Other | Admitting: Family Medicine

## 2022-05-05 VITALS — BP 137/68 | HR 73 | Temp 97.3°F | Ht 68.0 in | Wt 199.4 lb

## 2022-05-05 DIAGNOSIS — M65341 Trigger finger, right ring finger: Secondary | ICD-10-CM

## 2022-05-05 DIAGNOSIS — N1831 Chronic kidney disease, stage 3a: Secondary | ICD-10-CM | POA: Diagnosis not present

## 2022-05-05 DIAGNOSIS — Z23 Encounter for immunization: Secondary | ICD-10-CM

## 2022-05-05 DIAGNOSIS — F419 Anxiety disorder, unspecified: Secondary | ICD-10-CM

## 2022-05-05 DIAGNOSIS — E039 Hypothyroidism, unspecified: Secondary | ICD-10-CM | POA: Diagnosis not present

## 2022-05-05 DIAGNOSIS — D508 Other iron deficiency anemias: Secondary | ICD-10-CM

## 2022-05-05 MED ORDER — BUSPIRONE HCL 5 MG PO TABS: 5.0000 mg | ORAL_TABLET | Freq: Two times a day (BID) | ORAL | 99 refills | Status: DC | PRN

## 2022-05-05 MED ORDER — METHYLPREDNISOLONE ACETATE 40 MG/ML IJ SUSP
20.0000 mg | Freq: Once | INTRAMUSCULAR | Status: AC
  Administered 2022-05-05: 20 mg via INTRAMUSCULAR

## 2022-05-05 NOTE — Progress Notes (Signed)
Subjective: CC: Chronic follow-up, hypothyroidism PCP: [name], DO Brenda Solis is a 87 y.o. female presenting to clinic today for:  1.  Hypothyroidism Patient is compliant with thyroid replacement.  Denies any tremor, heart palpitations, changes in bowel habits.  2.  Trigger finger Patient reports a trigger finger of the right hand at the fourth digit.  It was getting stuck but is no longer doing so.  She has a knot on the palmar aspect that is painful however and she would like to talk about getting that treated  3.  Anxiety disorder Patient reports intermittent anxiety that is not controlled by Cymbalta.  She is also on Wellbutrin for depression.  She notes that this is not frequent but she would like something to try and help as an as needed.  4.  Low back pain Chronic and stable.  Continues to have response to Robaxin so would like to continue.  5.  Iron deficiency anemia Compliant with iron twice daily but admits that she does not take any vitamin C with this.  She has not seen any bleeding.   ROS: Per HPI  Allergies  Allergen Reactions   Gabapentin Other (See Comments)    Dizzy   Oxycodone Itching   Past Medical History:  Diagnosis Date   Anemia    Anxiety    Depression    Heart murmur    Hyperlipidemia    Hypertension    Hypothyroidism    Insomnia, unspecified    Pneumonia    Spondylosis of unspecified site without mention of myelopathy    Thyroid disease    hypothyroidism    Current Outpatient Medications:    buPROPion (WELLBUTRIN XL) 300 MG 24 hr tablet, Take 1 tablet (300 mg total) by mouth daily. For depression, Disp: 90 tablet, Rfl: 3   Cholecalciferol (VITAMIN D3 PO), Take 1 tablet by mouth daily., Disp: , Rfl:    DULoxetine (CYMBALTA) 60 MG capsule, Take 1 capsule (60 mg total) by mouth daily. For depression and chronic pain, Disp: 90 capsule, Rfl: 3   levothyroxine (SYNTHROID) 75 MCG tablet, TAKE 1 TABLET BY MOUTH EVERY DAY BEFORE  BREAKFAST for thyroid, Disp: 90 tablet, Rfl: 3   meclizine (ANTIVERT) 25 MG tablet, TAKE ONE TABLET BY MOUTH THREE TIMES DAILY AS NEEDED FOR DIZZINESS, Disp: 30 tablet, Rfl: 2   methocarbamol (ROBAXIN) 500 MG tablet, Take 1 tablet (500 mg total) by mouth every 8 (eight) hours as needed for muscle spasms., Disp: 40 tablet, Rfl: 0   Multiple Vitamin (MULTIVITAMIN WITH MINERALS) TABS tablet, Take 1 tablet by mouth daily., Disp: , Rfl:    nystatin cream (MYCOSTATIN), Apply 1 application topically 2 (two) times daily as needed (rash)., Disp: 30 g, Rfl: 2 Social History   Socioeconomic History   Marital status: Widowed    Spouse name: Not on file   Number of children: 3   Years of education: Not on file   Highest education level: 12th grade  Occupational History    Employer: RETIRED    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  Tobacco Use   Smoking status: Never   Smokeless tobacco: Never  Vaping Use   Vaping Use: Never used  Substance and Sexual Activity   Alcohol use: No    Alcohol/week: 0.0 standard drinks of alcohol   Drug use: No   Sexual activity: Not Currently  Other Topics Concern   Not on file  Social History Narrative   Not on file  Social Determinants of Health   Financial Resource Strain: Low Risk  (08/31/2021)   Overall Financial Resource Strain (CARDIA)    Difficulty of Paying Living Expenses: Not very hard  Food Insecurity: No Food Insecurity (08/31/2021)   Hunger Vital Sign    Worried About Running Out of Food in the Last Year: Never true    Ran Out of Food in the Last Year: Never true  Transportation Needs: No Transportation Needs (08/31/2021)   PRAPARE - Hydrologist (Medical): No    Lack of Transportation (Non-Medical): No  Physical Activity: Insufficiently Active (08/31/2021)   Exercise Vital Sign    Days of Exercise per Week: 2 days    Minutes of Exercise per Session: 60 min  Stress: No Stress Concern Present (08/31/2021)   [place]    Feeling of Stress : Not at all  Social Connections: Moderately Integrated (08/31/2021)   Social Connection and Isolation Panel [NHANES]    Frequency of Communication with Friends and Family: More than three times a week    Frequency of Social Gatherings with Friends and Family: More than three times a week    Attends Religious Services: More than 4 times per year    Active Member of [employer] or Organizations: Yes    Attends Archivist Meetings: More than 4 times per year    Marital Status: Widowed  Intimate Partner Violence: Not At Risk (08/31/2021)   Humiliation, Afraid, Rape, and Kick questionnaire    Fear of Current or Ex-Partner: No    Emotionally Abused: No    Physically Abused: No    Sexually Abused: No   Family History  Problem Relation Age of Onset   Coronary artery disease Brother        68s   Heart disease Brother    Cancer Mother        breast   Osteoporosis Mother    Heart disease Mother    Osteoporosis Maternal Aunt    Heart disease Father    Stroke Father    Esophageal varices Brother    Stroke Other        FAMILY   Esophageal varices Other        FAMILY    Objective: Office vital signs reviewed. BP 137/68   Pulse 73   Temp (!) 97.3 F (36.3 C)   Ht '5\' 8"'$  (1.727 m)   Wt 199 lb 6.4 oz (90.4 kg)   SpO2 95%   BMI 30.32 kg/m   Physical Examination:  General: Awake, alert, well nourished, No acute distress HEENT: sclera white, MMM Cardio: regular rate and rhythm, S1S2 heard, no murmurs appreciated Pulm: clear to auscultation bilaterally, no wheezes, rhonchi or rales; normal work of breathing on room air MSK: Antalgic gait.  Right hand with palpable nodule in the fourth pulley system on the palmar aspect.  She has full flexion and extension of the finger  Trigger finger INJECTION:  Patient denies allergy to antiseptics (including iodine) and anesthetics.  Patient denies h/o diabetes,  frequent steroid use, use of blood thinners/ antiplatelets.  Patient was given informed consent and a signed copy has been placed in the chart. Appropriate time out was taken. Area prepped and draped in usual sterile fashion. Anatomic landmarks were identified and injection site was marked.  Ethyl chloride spray was used to numb the area and 0.5 cc of methylprednisolone 40 mg/ml plus  0.5 cc of 1%  lidocaine without epinephrine was injected into the right fourth pulley area directly into the palpable nodule.  Prior to injection I ensure that we were not in the tendon by having her flex that fourth digit. The patient tolerated the procedure well and there were no immediate complications. Estimated blood loss is less than 1 cc.  Post procedure instructions were reviewed and handout outlining these instructions were provided to patient.   Assessment/ Plan: 87 y.o. female   Stage 3a chronic kidney disease ([place]) - Plan: Renal Function Panel  Acquired hypothyroidism - Plan: TSH, T4, Free  Trigger ring finger of right hand - Plan: methylPREDNISolone acetate (DEPO-MEDROL) injection 20 mg  Iron deficiency anemia secondary to inadequate dietary iron intake - Plan: CBC, Iron, TIBC and Ferritin Panel  Anxiety - Plan: busPIRone (BUSPAR) 5 MG tablet  Check renal function, thyroid levels.  Trigger finger treated with corticosteroid injection.  We discussed that the if this was not helpful that we would plan referral to hand specialist.  History of iron deficiency anemia and is compliant with iron.  Check iron levels, CBC  BuSpar added for as needed use that we discussed that typically this works better as a scheduled twice daily dosing.  Follow-up in 3 months, sooner if concerns arise  No orders of the defined types were placed in this encounter.  No orders of the defined types were placed in this encounter.    [name], DO [place] [phone]

## 2022-05-05 NOTE — Patient Instructions (Signed)
Trigger Finger  Trigger finger, also called stenosing tenosynovitis,  is a condition that causes a finger to get stuck in a bent position. Each finger has a tendon, which is a tough, cord-like tissue that connects muscle to bone, and each tendon passes through a tunnel of tissue called a tendon sheath. To move your finger, your tendon needs to glide freely through the sheath. Trigger finger happens when the tendon or the sheath thickens, making it difficult to move your finger. Trigger finger can affect any finger or a thumb. It may affect more than one finger. Mild cases may clear up with rest and medicine. Severe cases require more treatment. What are the causes? Trigger finger is caused by a thickened finger tendon or tendon sheath. The cause of this thickening is not known. What increases the risk? The following factors may make you more likely to develop this condition: Doing activities that require a strong grip. Having rheumatoid arthritis, gout, or diabetes. Being 40-60 years old. Being female. What are the signs or symptoms? Symptoms of this condition include: Pain when bending or straightening your finger. Tenderness or swelling where your finger attaches to the palm of your hand. A lump in the palm of your hand or on the inside of your finger. Hearing a noise like a pop or a snap when you try to straighten your finger. Feeling a catching or locking sensation when you try to straighten your finger. Being unable to straighten your finger. How is this diagnosed? This condition is diagnosed based on your symptoms and a physical exam. How is this treated? This condition may be treated by: Resting your finger and avoiding activities that make symptoms worse. Wearing a finger splint to keep your finger extended. Taking NSAIDs, such as ibuprofen, to relieve pain and swelling. Doing gentle exercises to stretch the finger as told by your health care provider. Having medicine that reduces  swelling and inflammation (steroids) injected into the tendon sheath. Injections may need to be repeated. Having surgery to open the tendon sheath. This may be done if other treatments do not work and you cannot straighten your finger. You may need physical therapy after surgery. Follow these instructions at home: If you have a splint: Wear the splint as told by your health care provider. Remove it only as told by your health care provider. Loosen it if your fingers tingle, become numb, or turn cold and blue. Keep it clean. If the splint is not waterproof: Do not let it get wet. Cover it with a watertight covering when you take a bath or shower. Managing pain, stiffness, and swelling     If directed, apply heat to the affected area as often as told by your health care provider. Use the heat source that your health care provider recommends, such as a moist heat pack or a heating pad. Place a towel between your skin and the heat source. Leave the heat on for 20-30 minutes. Remove the heat if your skin turns bright red. This is especially important if you are unable to feel pain, heat, or cold. You may have a greater risk of getting burned. If directed, put ice on the painful area. To do this: If you have a removable splint, remove it as told by your health care provider. Put ice in a plastic bag. Place a towel between your skin and the bag or between your splint and the bag. Leave the ice on for 20 minutes, 2-3 times a day.  Activity Rest   your finger as told by your health care provider. Avoid activities that make the pain worse. Return to your normal activities as told by your health care provider. Ask your health care provider what activities are safe for you. Do exercises as told by your health care provider. Ask your health care provider when it is safe to drive if you have a splint on your hand. General instructions Take over-the-counter and prescription medicines only as told by  your health care provider. Keep all follow-up visits as told by your health care provider. This is important. Contact a health care provider if: Your symptoms are not improving with home care. Summary Trigger finger, also called stenosing tenosynovitis, causes your finger to get stuck in a bent position. This can make it difficult and painful to straighten your finger. This condition develops when a finger tendon or tendon sheath thickens. Treatment may include resting your finger, wearing a splint, and taking medicines. In severe cases, surgery to open the tendon sheath may be needed. This information is not intended to replace advice given to you by your health care provider. Make sure you discuss any questions you have with your health care provider. Document Revised: 10/30/2018 Document Reviewed: 10/30/2018 Elsevier Patient Education  2023 Elsevier Inc.  

## 2022-05-06 LAB — RENAL FUNCTION PANEL
Albumin: 4.2 g/dL (ref 3.7–4.7)
BUN/Creatinine Ratio: 15 (ref 12–28)
BUN: 19 mg/dL (ref 8–27)
CO2: 23 mmol/L (ref 20–29)
Calcium: 10 mg/dL (ref 8.7–10.3)
Chloride: 102 mmol/L (ref 96–106)
Creatinine, Ser: 1.26 mg/dL — ABNORMAL HIGH (ref 0.57–1.00)
Glucose: 86 mg/dL (ref 70–99)
Phosphorus: 4 mg/dL (ref 3.0–4.3)
Potassium: 5.1 mmol/L (ref 3.5–5.2)
Sodium: 138 mmol/L (ref 134–144)
eGFR: 42 mL/min/{1.73_m2} — ABNORMAL LOW (ref >59)

## 2022-05-06 LAB — IRON,TIBC AND FERRITIN PANEL
Ferritin: 53 ng/mL (ref 15–150)
Iron Saturation: 10 % — ABNORMAL LOW (ref 15–55)
Iron: 34 ug/dL (ref 27–139)
Total Iron Binding Capacity: 355 ug/dL (ref 250–450)
UIBC: 321 ug/dL (ref 118–369)

## 2022-05-06 LAB — CBC
Hematocrit: 33.6 % — ABNORMAL LOW (ref 34.0–46.6)
Hemoglobin: 11.2 g/dL (ref 11.1–15.9)
MCH: 29.7 pg (ref 26.6–33.0)
MCHC: 33.3 g/dL (ref 31.5–35.7)
MCV: 89 fL (ref 79–97)
Platelets: 219 10*3/uL (ref 150–450)
RBC: 3.77 x10E6/uL (ref 3.77–5.28)
RDW: 14.8 % (ref 11.7–15.4)
WBC: 8.5 10*3/uL (ref 3.4–10.8)

## 2022-05-06 LAB — T4, FREE: Free T4: 1.42 ng/dL (ref 0.82–1.77)

## 2022-05-06 LAB — TSH: TSH: 3.6 u[IU]/mL (ref 0.450–4.500)

## 2022-05-07 NOTE — Progress Notes (Signed)
Patient returning call. Please call back

## 2022-05-10 ENCOUNTER — Telehealth: Payer: Self-pay | Admitting: Family Medicine

## 2022-05-10 NOTE — Telephone Encounter (Signed)
Pt aware she should be taking otc

## 2022-05-19 ENCOUNTER — Ambulatory Visit (INDEPENDENT_AMBULATORY_CARE_PROVIDER_SITE_OTHER): Payer: Medicare Other | Admitting: Family Medicine

## 2022-05-19 ENCOUNTER — Ambulatory Visit: Payer: Medicare Other | Admitting: Family Medicine

## 2022-05-19 ENCOUNTER — Encounter: Payer: Self-pay | Admitting: Family Medicine

## 2022-05-19 VITALS — BP 159/69 | HR 95 | Temp 98.3°F | Ht 68.0 in | Wt 198.0 lb

## 2022-05-19 DIAGNOSIS — J208 Acute bronchitis due to other specified organisms: Secondary | ICD-10-CM | POA: Diagnosis not present

## 2022-05-19 DIAGNOSIS — B9689 Other specified bacterial agents as the cause of diseases classified elsewhere: Secondary | ICD-10-CM | POA: Diagnosis not present

## 2022-05-19 LAB — VERITOR FLU A/B WAIVED
Influenza A: NEGATIVE
Influenza B: NEGATIVE

## 2022-05-19 MED ORDER — ALBUTEROL SULFATE HFA 108 (90 BASE) MCG/ACT IN AERS: 2.0000 | INHALATION_SPRAY | Freq: Four times a day (QID) | RESPIRATORY_TRACT | 0 refills | Status: DC | PRN

## 2022-05-19 MED ORDER — PROMETHAZINE-DM 6.25-15 MG/5ML PO SYRP: 5.0000 mL | ORAL_SOLUTION | Freq: Four times a day (QID) | ORAL | 0 refills | Status: DC | PRN

## 2022-05-19 MED ORDER — AMOXICILLIN 500 MG PO CAPS: 500.0000 mg | ORAL_CAPSULE | Freq: Two times a day (BID) | ORAL | 0 refills | Status: DC

## 2022-05-19 NOTE — Progress Notes (Signed)
BP (!) 159/69   Pulse 95   Temp 98.3 F (36.8 C)   Ht '5\' 8"'$  (1.727 m)   Wt 198 lb (89.8 kg)   SpO2 96%   BMI 30.11 kg/m    Subjective:   Patient ID: Brenda Solis, female    DOB: 08/11/1934, 87 y.o.   MRN: [id number]  HPI: Brenda Solis is a 87 y.o. female presenting on 05/19/2022 for Cough (Hacking cough started three days ago. H/O Bronchitis)   HPI Cough and congestion Patient is coming in today complaining of cough and chest congestion and feels well.  She says she started with a small cough about a week ago but then started getting more patchy and more deep into her chest over the past 2 or 4 days.  She says she is having some wheezing.  She is using Advil and it has helped the fever down.  She says her fevers has 101 this morning.  Relevant past medical, surgical, family and social history reviewed and updated as indicated. Interim medical history since our last visit reviewed. Allergies and medications reviewed and updated.  Review of Systems  Constitutional:  Positive for fever. Negative for chills.  HENT:  Positive for congestion and postnasal drip. Negative for ear discharge, ear pain, rhinorrhea, sinus pressure, sneezing and sore throat.   Eyes:  Negative for pain, redness and visual disturbance.  Respiratory:  Positive for cough and wheezing. Negative for chest tightness and shortness of breath.   Cardiovascular:  Negative for chest pain and leg swelling.  Genitourinary:  Negative for difficulty urinating and dysuria.  Musculoskeletal:  Negative for back pain and gait problem.  Skin:  Negative for rash.  Neurological:  Negative for light-headedness and headaches.  Psychiatric/Behavioral:  Negative for agitation and behavioral problems.   All other systems reviewed and are negative.   Per HPI unless specifically indicated above   Allergies as of 05/19/2022       Reactions   Gabapentin Other (See Comments)   Dizzy   Oxycodone Itching        Medication  List        Accurate as of May 19, 2022  2:14 PM. If you have any questions, ask your nurse or doctor.          buPROPion 300 MG 24 hr tablet Commonly known as: WELLBUTRIN XL Take 1 tablet (300 mg total) by mouth daily. For depression   busPIRone 5 MG tablet Commonly known as: BUSPAR Take 1 tablet (5 mg total) by mouth 2 (two) times daily as needed.   DULoxetine 60 MG capsule Commonly known as: Cymbalta Take 1 capsule (60 mg total) by mouth daily. For depression and chronic pain   levothyroxine 75 MCG tablet Commonly known as: SYNTHROID TAKE 1 TABLET BY MOUTH EVERY DAY BEFORE BREAKFAST for thyroid   meclizine 25 MG tablet Commonly known as: ANTIVERT TAKE ONE TABLET BY MOUTH THREE TIMES DAILY AS NEEDED FOR DIZZINESS   methocarbamol 500 MG tablet Commonly known as: ROBAXIN Take 1 tablet (500 mg total) by mouth every 8 (eight) hours as needed for muscle spasms.   multivitamin with minerals Tabs tablet Take 1 tablet by mouth daily.   nystatin cream Commonly known as: MYCOSTATIN Apply 1 application topically 2 (two) times daily as needed (rash).   VITAMIN D3 PO Take 1 tablet by mouth daily.         Objective:   BP (!) 159/69   Pulse 95   Temp  98.3 F (36.8 C)   Ht '5\' 8"'$  (1.727 m)   Wt 198 lb (89.8 kg)   SpO2 96%   BMI 30.11 kg/m   Wt Readings from Last 3 Encounters:  05/19/22 198 lb (89.8 kg)  05/05/22 199 lb 6.4 oz (90.4 kg)  01/25/22 189 lb 12.8 oz (86.1 kg)    Physical Exam Vitals and nursing note reviewed.  Constitutional:      General: She is not in acute distress.    Appearance: She is well-developed. She is not diaphoretic.  Eyes:     Conjunctiva/sclera: Conjunctivae normal.  Cardiovascular:     Rate and Rhythm: Normal rate and regular rhythm.     Heart sounds: Normal heart sounds. No murmur heard. Pulmonary:     Effort: Pulmonary effort is normal. No respiratory distress.     Breath sounds: Normal breath sounds. No wheezing.   Skin:    General: Skin is warm and dry.     Findings: No rash.  Neurological:     Mental Status: She is alert and oriented to person, place, and time.     Coordination: Coordination normal.  Psychiatric:        Behavior: Behavior normal.     Flu: Negative for A/B  Assessment & Plan:   Problem List Items Addressed This Visit   None Visit Diagnoses     Acute bacterial bronchitis    -  Primary   Relevant Medications   amoxicillin (AMOXIL) 500 MG capsule   albuterol (VENTOLIN HFA) 108 (90 Base) MCG/ACT inhaler   promethazine-dextromethorphan (PROMETHAZINE-DM) 6.25-15 MG/5ML syrup   Other Relevant Orders   Veritor Flu A/B Waived   Novel Coronavirus, NAA ([facility])     Going to start the treatment for bronchitis, will also wait for COVID test, flu was negative.  Follow up plan: Return if symptoms worsen or fail to improve.  Counseling provided for all of the vaccine components Orders Placed This Encounter  Procedures   Veritor Flu A/B Wolfe Zeenat Jeanbaptiste, MD [facility] 05/19/2022, 2:14 PM

## 2022-05-20 LAB — NOVEL CORONAVIRUS, NAA: SARS-CoV-2, NAA: DETECTED — AB

## 2022-05-21 ENCOUNTER — Other Ambulatory Visit: Payer: Self-pay | Admitting: Family Medicine

## 2022-05-21 DIAGNOSIS — U071 COVID-19: Secondary | ICD-10-CM

## 2022-05-21 MED ORDER — MOLNUPIRAVIR EUA 200MG CAPSULE: 4.0000 | ORAL_CAPSULE | Freq: Two times a day (BID) | ORAL | 0 refills | Status: AC

## 2022-05-21 NOTE — Progress Notes (Signed)
Positive covid, sent antiviral

## 2022-05-28 ENCOUNTER — Other Ambulatory Visit: Payer: Self-pay | Admitting: Family Medicine

## 2022-05-28 DIAGNOSIS — F419 Anxiety disorder, unspecified: Secondary | ICD-10-CM

## 2022-06-05 ENCOUNTER — Other Ambulatory Visit: Payer: Self-pay | Admitting: Family Medicine

## 2022-06-08 ENCOUNTER — Telehealth: Payer: Self-pay | Admitting: *Deleted

## 2022-06-08 ENCOUNTER — Other Ambulatory Visit: Payer: Self-pay | Admitting: Family Medicine

## 2022-06-08 MED ORDER — TRAZODONE HCL 50 MG PO TABS: 50.0000 mg | ORAL_TABLET | Freq: Every evening | ORAL | 0 refills | Status: DC | PRN

## 2022-06-08 NOTE — Telephone Encounter (Signed)
RF request for Trazodone, this is not on Pt's current med list. It was [facility] 08/25/21 at hospital discharge. Last RF was 06/08/21 #180 w/ 3 RFs. Pt re-started medication about 2 wks after hospital discharge and has gotten all the refills on the above Rx from 06/08/21 and is now out.  Next OV w/ PCP is 08/09/22 pt uses [facility]

## 2022-06-08 NOTE — Telephone Encounter (Signed)
Please let the patient know that I sent their prescription to their pharmacy. Thanks, WS 

## 2022-06-08 NOTE — Telephone Encounter (Signed)
Pt aware.

## 2022-07-01 ENCOUNTER — Other Ambulatory Visit: Payer: Self-pay | Admitting: Family Medicine

## 2022-08-09 ENCOUNTER — Encounter: Payer: Self-pay | Admitting: Family Medicine

## 2022-08-09 ENCOUNTER — Ambulatory Visit (INDEPENDENT_AMBULATORY_CARE_PROVIDER_SITE_OTHER): Payer: Medicare Other | Admitting: Family Medicine

## 2022-08-09 VITALS — BP 122/62 | HR 76 | Temp 98.4°F | Ht 68.0 in | Wt 199.0 lb

## 2022-08-09 DIAGNOSIS — M7521 Bicipital tendinitis, right shoulder: Secondary | ICD-10-CM

## 2022-08-09 DIAGNOSIS — R2689 Other abnormalities of gait and mobility: Secondary | ICD-10-CM

## 2022-08-09 DIAGNOSIS — M48062 Spinal stenosis, lumbar region with neurogenic claudication: Secondary | ICD-10-CM

## 2022-08-09 DIAGNOSIS — N1831 Chronic kidney disease, stage 3a: Secondary | ICD-10-CM

## 2022-08-09 DIAGNOSIS — F419 Anxiety disorder, unspecified: Secondary | ICD-10-CM

## 2022-08-09 DIAGNOSIS — M65341 Trigger finger, right ring finger: Secondary | ICD-10-CM | POA: Diagnosis not present

## 2022-08-09 MED ORDER — DICLOFENAC SODIUM 1 % EX GEL: 2.0000 g | Freq: Three times a day (TID) | CUTANEOUS | 99 refills | Status: DC | PRN

## 2022-08-09 MED ORDER — PREDNISONE 20 MG PO TABS: ORAL_TABLET | ORAL | 0 refills | Status: DC

## 2022-08-09 MED ORDER — BUSPIRONE HCL 5 MG PO TABS: ORAL_TABLET | ORAL | 3 refills | Status: DC

## 2022-08-09 NOTE — Progress Notes (Signed)
Subjective: CC: Follow-up trigger finger PCP: [name], DO ZB:[id number] Brenda Solis is a 88 y.o. female presenting to clinic today for:  1.  Trigger finger Patient reports his trigger finger responded excellently to the injection.  She has had no recurrent issues.  2.  Right shoulder pain She actually complains of right bicep pain.  Has had left-sided biceps tendinitis previously.  She cannot take oral NSAIDs secondary to CKD.  Utilizing Tylenol but this has not been helpful.  She also has hydrocodone but this has not been helpful.  Not utilizing any topical NSAIDs.  She does report that she is right-hand dominant and often pushes off with that right hand to stand up.  She is not able to stand up without pushoff.  She utilizes a cane on the right side.  Her daughter has encouraged her to use her walker but patient is resistant to this.   ROS: Per HPI  Allergies  Allergen Reactions   Gabapentin Other (See Comments)    Dizzy   Oxycodone Itching   Past Medical History:  Diagnosis Date   Anemia    Anxiety    Depression    Heart murmur    Hyperlipidemia    Hypertension    Hypothyroidism    Insomnia, unspecified    Pneumonia    Spondylosis of unspecified site without mention of myelopathy    Thyroid disease    hypothyroidism    Current Outpatient Medications:    albuterol (VENTOLIN HFA) 108 (90 Base) MCG/ACT inhaler, Inhale 2 puffs into the lungs every 6 (six) hours as needed for wheezing or shortness of breath., Disp: 8 g, Rfl: 0   amoxicillin (AMOXIL) 500 MG capsule, Take 1 capsule (500 mg total) by mouth 2 (two) times daily., Disp: 20 capsule, Rfl: 0   buPROPion (WELLBUTRIN XL) 300 MG 24 hr tablet, Take 1 tablet (300 mg total) by mouth daily. For depression, Disp: 90 tablet, Rfl: 3   busPIRone (BUSPAR) 5 MG tablet, TAKE 1 TABLET BY MOUTH 2 TIMES DAILY AS NEEDED., Disp: 180 tablet, Rfl: 0   Cholecalciferol (VITAMIN D3 PO), Take 1 tablet by mouth daily., Disp: , Rfl:     DULoxetine (CYMBALTA) 60 MG capsule, Take 1 capsule (60 mg total) by mouth daily. For depression and chronic pain, Disp: 90 capsule, Rfl: 3   HYDROcodone-acetaminophen (NORCO/VICODIN) 5-325 MG tablet, Take 1 tablet 3 times a day by oral route as needed., Disp: , Rfl:    levothyroxine (SYNTHROID) 75 MCG tablet, TAKE 1 TABLET BY MOUTH EVERY DAY BEFORE BREAKFAST for thyroid, Disp: 90 tablet, Rfl: 3   meclizine (ANTIVERT) 25 MG tablet, TAKE ONE TABLET BY MOUTH THREE TIMES DAILY AS NEEDED FOR DIZZINESS, Disp: 30 tablet, Rfl: 2   methocarbamol (ROBAXIN) 500 MG tablet, Take 1 tablet (500 mg total) by mouth every 8 (eight) hours as needed for muscle spasms., Disp: 40 tablet, Rfl: 0   Multiple Vitamin (MULTIVITAMIN WITH MINERALS) TABS tablet, Take 1 tablet by mouth daily., Disp: , Rfl:    nystatin cream (MYCOSTATIN), Apply 1 application topically 2 (two) times daily as needed (rash)., Disp: 30 g, Rfl: 2   promethazine-dextromethorphan (PROMETHAZINE-DM) 6.25-15 MG/5ML syrup, Take 5 mLs by mouth 4 (four) times daily as needed for cough., Disp: 118 mL, Rfl: 0   traZODone (DESYREL) 50 MG tablet, TAKE 1-2 TABLETS (50-100 MG TOTAL) BY MOUTH AT BEDTIME AS NEEDED. 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  Tobacco Use   Smoking status: Never   Smokeless tobacco: Never  Vaping Use   Vaping Use: Never used  Substance and Sexual Activity   Alcohol use: No    Alcohol/week: 0.0 standard drinks of alcohol   Drug use: No   Sexual activity: Not Currently  Other Topics Concern   Not on file  Social History Narrative   Not on file   Social Determinants of Health   Financial Resource Strain: Low Risk  (08/31/2021)   Overall Financial Resource Strain  (CARDIA)    Difficulty of Paying Living Expenses: Not very hard  Food Insecurity: No Food Insecurity (08/31/2021)   Hunger Vital Sign    Worried About Running Out of Food in the Last Year: Never true    Ran Out of Food in the Last Year: Never true  Transportation Needs: No Transportation Needs (08/31/2021)   PRAPARE - Hydrologist (Medical): No    Lack of Transportation (Non-Medical): No  Physical Activity: Insufficiently Active (08/31/2021)   Exercise Vital Sign    Days of Exercise per Week: 2 days    Minutes of Exercise per Session: 60 min  Stress: No Stress Concern Present (08/31/2021)   [place]    Feeling of Stress : Not at all  Social Connections: Moderately Integrated (08/31/2021)   Social Connection and Isolation Panel [NHANES]    Frequency of Communication with Friends and Family: More than three times a week    Frequency of Social Gatherings with Friends and Family: More than three times a week    Attends Religious Services: More than 4 times per year    Active Member of [employer] or Organizations: Yes    Attends Archivist Meetings: More than 4 times per year    Marital Status: Widowed  Intimate Partner Violence: Not At Risk (08/31/2021)   Humiliation, Afraid, Rape, and Kick questionnaire    Fear of Current or Ex-Partner: No    Emotionally Abused: No    Physically Abused: No    Sexually Abused: No   Family History  Problem Relation Age of Onset   Coronary artery disease Brother        71s   Heart disease Brother    Cancer Mother        breast   Osteoporosis Mother    Heart disease Mother    Osteoporosis Maternal Aunt    Heart disease Father    Stroke Father    Esophageal varices Brother    Stroke Other        FAMILY   Esophageal varices Other        FAMILY    Objective: Office vital signs reviewed. BP 122/62   Pulse 76   Temp 98.4 F (36.9 C)   Ht 5' 8"$  (1.727  m)   Wt 199 lb (90.3 kg)   SpO2 96%   BMI 30.26 kg/m   Physical Examination:  General: Awake, alert, well nourished, No acute distress HEENT: sclera white, MMM Cardio: regular rate and rhythm, S1S2 heard, no murmurs appreciated Pulm: clear to auscultation bilaterally, no wheezes, rhonchi or rales; normal work of breathing on room air MSK:  Requires assistance for ambulation.  Currently ambulating with use of cane Right bicep: Tender at distal insertion/cubital fossa.  There are no palpable deformities. 5/5 strength  Assessment/ Plan: 88 y.o. female   Biceps tendinitis, right - Plan: diclofenac Sodium (VOLTAREN) 1 % GEL, predniSONE (DELTASONE) 20 MG tablet  Stage 3a chronic kidney disease (HCC) - Plan: diclofenac Sodium (VOLTAREN) 1 % GEL, predniSONE (DELTASONE) 20 MG tablet  Trigger ring finger of right hand  Spinal stenosis, lumbar region, with neurogenic claudication - Plan: Walker rolling  Balance problems - Plan: Walker rolling  Anxiety - Plan: busPIRone (BUSPAR) 5 MG tablet  Suspect a bicep tendinitis.  Unfortunately given her renal disease medications are limited.  Okay to use topical Voltaren gel following her prednisone burst.  Discussed consideration for referral to physical therapy.  Standing, upright rolling walker has been ordered and faxed to [facility].  Hopefully this will allow her to discontinue excessive pushoff and overuse of that upper extremity.  Trigger finger has responded well to corticosteroid injection. No orders of the defined types were placed in this encounter.  No orders of the defined types were placed in this encounter.    [name], DO [place] [phone]

## 2022-08-17 DIAGNOSIS — M961 Postlaminectomy syndrome, not elsewhere classified: Secondary | ICD-10-CM | POA: Diagnosis not present

## 2022-08-17 DIAGNOSIS — M5416 Radiculopathy, lumbar region: Secondary | ICD-10-CM | POA: Diagnosis not present

## 2022-09-02 ENCOUNTER — Ambulatory Visit (INDEPENDENT_AMBULATORY_CARE_PROVIDER_SITE_OTHER): Payer: Medicare Other

## 2022-09-02 VITALS — Ht 68.0 in | Wt 199.0 lb

## 2022-09-02 DIAGNOSIS — Z780 Asymptomatic menopausal state: Secondary | ICD-10-CM

## 2022-09-02 DIAGNOSIS — Z0000 Encounter for general adult medical examination without abnormal findings: Secondary | ICD-10-CM | POA: Diagnosis not present

## 2022-09-02 NOTE — Progress Notes (Signed)
Subjective:   Brenda Solis is a 88 y.o. female who presents for Medicare Annual (Subsequent) preventive examination. I connected with  [name] on 09/02/22 by a audio enabled telemedicine application and verified that I am speaking with the correct person using two identifiers.  Patient Location: Home  Provider Location: Home Office  I discussed the limitations of evaluation and management by telemedicine. The patient expressed understanding and agreed to proceed.  Review of Systems     Cardiac Risk Factors include: advanced age (>45mn, >>24women);hypertension;dyslipidemia     Objective:    Today's Vitals   09/02/22 0920  Weight: 199 lb (90.3 kg)  Height: '5\' 8"'$  (1.727 m)   Body mass index is 30.26 kg/m.     09/02/2022    9:24 AM 08/31/2021    9:08 AM 08/24/2021    1:40 PM 08/11/2021    2:04 PM 05/29/2021    2:08 PM 08/29/2020    8:21 AM 06/29/2020    5:51 PM  Advanced Directives  Does Patient Have a Medical Advance Directive? No No No No No No No  Would patient like information on creating a medical advance directive? Yes (MAU/Ambulatory/Procedural Areas - Information given) No - Patient declined No - Guardian declined No - Patient declined  No - Patient declined No - Patient declined    Current Medications (verified) Outpatient Encounter Medications as of 09/02/2022  Medication Sig   albuterol (VENTOLIN HFA) 108 (90 Base) MCG/ACT inhaler Inhale 2 puffs into the lungs every 6 (six) hours as needed for wheezing or shortness of breath.   amoxicillin (AMOXIL) 500 MG capsule Take 1 capsule (500 mg total) by mouth 2 (two) times daily.   buPROPion (WELLBUTRIN XL) 300 MG 24 hr tablet Take 1 tablet (300 mg total) by mouth daily. For depression   busPIRone (BUSPAR) 5 MG tablet TAKE 1 TABLET BY MOUTH 2 TIMES DAILY AS NEEDED.   Cholecalciferol (VITAMIN D3 PO) Take 1 tablet by mouth daily.   diclofenac Sodium (VOLTAREN) 1 % GEL Apply 2 g topically 3 (three) times daily as needed  (pain).   DULoxetine (CYMBALTA) 60 MG capsule Take 1 capsule (60 mg total) by mouth daily. For depression and chronic pain   HYDROcodone-acetaminophen (NORCO/VICODIN) 5-325 MG tablet Take 1 tablet 3 times a day by oral route as needed.   levothyroxine (SYNTHROID) 75 MCG tablet TAKE 1 TABLET BY MOUTH EVERY DAY BEFORE BREAKFAST for thyroid   meclizine (ANTIVERT) 25 MG tablet TAKE ONE TABLET BY MOUTH THREE TIMES DAILY AS NEEDED FOR DIZZINESS   methocarbamol (ROBAXIN) 500 MG tablet Take 1 tablet (500 mg total) by mouth every 8 (eight) hours as needed for muscle spasms.   Multiple Vitamin (MULTIVITAMIN WITH MINERALS) TABS tablet Take 1 tablet by mouth daily.   nystatin cream (MYCOSTATIN) Apply 1 application topically 2 (two) times daily as needed (rash).   predniSONE (DELTASONE) 20 MG tablet 2 po at same time daily for 5 days   traZODone (DESYREL) 50 MG tablet TAKE 1-2 TABLETS (50-100 MG TOTAL) BY MOUTH AT BEDTIME AS NEEDED. FOR SLEEP   No facility-administered encounter medications on file as of 09/02/2022.    Allergies (verified) Gabapentin and Oxycodone   History: Past Medical History:  Diagnosis Date   Anemia    Anxiety    Depression    Heart murmur    Hyperlipidemia    Hypertension    Hypothyroidism    Insomnia, unspecified    Pneumonia    Spondylosis of  unspecified site without mention of myelopathy    Thyroid disease    hypothyroidism   Past Surgical History:  Procedure Laterality Date   APPENDECTOMY     CATARACT EXTRACTION, BILATERAL Bilateral 2006   [place]   DECOMPRESSIVE LUMBAR LAMINECTOMY LEVEL 1 N/A 02/01/2013   Procedure: CENTRAL DECOMPRESSIVE LUMBAR LAMINECTOMY LEVEL 1 L4-L5;  Surgeon: Tobi Bastos, MD;  Location: [facility];  Service: Orthopedics;  Laterality: N/A;   DECOMPRESSIVE LUMBAR LAMINECTOMY LEVEL 1 N/A 11/20/2014   Procedure: REVISION L4-S1 DECOMPRESSION, L4-L5 IN SITU FUSION (LEVEL  1);  Surgeon: Melina Schools, MD;  Location: [place];  Service: Orthopedics;  Laterality: N/A;   KNEE SURGERY Right 2009   meniscus repair   SPINAL CORD STIMULATOR INSERTION N/A 06/25/2020   Procedure: SPINAL CORD STIMULATOR INSERTION;  Surgeon: Melina Schools, MD;  Location: [place];  Service: Orthopedics;  Laterality: N/A;  3 hrs   TOTAL HIP ARTHROPLASTY  2011   RIGHT   TOTAL KNEE ARTHROPLASTY Right 08/24/2021   Procedure: TOTAL KNEE ARTHROPLASTY;  Surgeon: Gaynelle Arabian, MD;  Location: [facility];  Service: Orthopedics;  Laterality: Right;   Family History  Problem Relation Age of Onset   Coronary artery disease Brother        30s   Heart disease Brother    Cancer Mother        breast   Osteoporosis Mother    Heart disease Mother    Osteoporosis Maternal Aunt    Heart disease Father    Stroke Father    Esophageal varices Brother    Stroke Other        FAMILY   Esophageal varices Other        FAMILY   Social History   Socioeconomic History   Marital status: Widowed    Spouse name: Not on file   Number of children: 3   Years of education: Not on file   Highest education level: 12th grade  Occupational History    Employer: RETIRED    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  Tobacco Use   Smoking status: Never   Smokeless tobacco: Never  Vaping Use   Vaping Use: Never used  Substance and Sexual Activity   Alcohol use: No    Alcohol/week: 0.0 standard drinks of alcohol   Drug use: No   Sexual activity: Not Currently  Other Topics Concern   Not on file  Social History Narrative   Not on file   Social Determinants of Health   Financial Resource Strain: Low Risk  (09/02/2022)   Overall Financial Resource Strain (CARDIA)    Difficulty of Paying Living Expenses: Not hard at all  Food Insecurity: No Food Insecurity (09/02/2022)   Hunger Vital Sign    Worried About Running Out of Food in the Last Year: Never true    Ran Out of Food in the Last Year: Never true  Transportation Needs:  No Transportation Needs (09/02/2022)   PRAPARE - Hydrologist (Medical): No    Lack of Transportation (Non-Medical): No  Physical Activity: Inactive (09/02/2022)   Exercise Vital Sign    Days of Exercise per Week: 0 days    Minutes of Exercise per Session: 0 min  Stress: No Stress Concern Present (09/02/2022)   [place]    Feeling of Stress : Not at all  Social Connections: Moderately Isolated (09/02/2022)   Social Connection and Isolation Panel [NHANES]    Frequency of Communication with Friends and Family: More than three times a week    Frequency of Social Gatherings with Friends and Family: More than three times a week    Attends Religious Services: More than 4 times per year    Active Member of [employer] or Organizations: No    Attends Archivist Meetings: Never    Marital Status: Widowed    Tobacco Counseling Counseling given: Not Answered   Clinical Intake:  Pre-visit preparation completed: Yes  Pain : No/denies pain     Nutritional Risks: None Diabetes: No  How often do you need to have someone help you when you read instructions, pamphlets, or other written materials from your doctor or pharmacy?: 1 - Never  Diabetic?no   Interpreter Needed?: No  Information entered by :: Jadene Pierini, LPN   Activities of Daily Living    09/02/2022    9:25 AM  In your present state of health, do you have any difficulty performing the following activities:  Hearing? 0  Vision? 0  Difficulty concentrating or making decisions? 0  Walking or climbing stairs? 0  Dressing or bathing? 0  Doing errands, shopping? 0  Preparing Food and eating ? N  Using the Toilet? N  In the past six months, have you accidently leaked urine? N  Do you have problems with loss of bowel control? N  Managing your Medications? N  Managing your Finances? N  Housekeeping or managing your Housekeeping? N     Patient Care Team: [name], DO as PCP - General (Family Medicine) Gaynelle Arabian, MD as Consulting Physician (Orthopedic Surgery) Celestia Khat, OD (Optometry)  Indicate any recent Medical Services you may have received from other than [facility] providers in the past year (date may be approximate).     Assessment:   This is a routine wellness examination for Brenda Solis.  Hearing/Vision screen Vision Screening - Comments:: Wears rx glasses - up to date with routine eye exams with  Dr.Johnson   Dietary issues and exercise activities discussed: Current Exercise Habits: The patient does not participate in regular exercise at present, Exercise limited by: neurologic condition(s);orthopedic condition(s)   Goals Addressed             This Visit's Progress    DIET - INCREASE WATER INTAKE   On track    Patient Stated   On track    08/21/2019 AWV Goal: Fall Prevention  Over the next year, patient will decrease their risk for falls by: Using assistive devices, such as a cane or walker, as needed Identifying fall risks within their home and correcting them by: Removing throw rugs Adding handrails to stairs or ramps Removing clutter and keeping a clear pathway throughout the home Increasing light, especially at night Adding shower handles/bars Raising toilet seat Identifying potential personal risk factors for falls: Medication side effects Incontinence/urgency Vestibular dysfunction Hearing loss Musculoskeletal disorders Neurological disorders Orthostatic hypotension         Depression Screen    09/02/2022    9:23 AM 08/09/2022    3:27 PM 05/19/2022    1:56 PM 05/05/2022   12:00 PM 01/06/2022    3:12 PM 08/31/2021    9:08 AM 08/03/2021    2:36 PM  PHQ 2/9 Scores  PHQ - 2 Score 0 0  0 0 0 0  PHQ- 9 Score 0 0    0 0  Exception Documentation   Patient refusal        Fall Risk    09/02/2022    9:21 AM 08/09/2022    3:27 PM 05/05/2022   12:00 PM 01/06/2022    3:12 PM  08/31/2021    9:06 AM  Fall Risk   Falls in the past year? 0 0 0 0 1  Number falls in past yr: 0 0   0  Injury with Fall? 0 0   0  Risk for fall due to : No Fall Risks No Fall Risks   History of fall(s);Impaired balance/gait;Orthopedic patient;Medication side effect  Follow up Falls prevention discussed Education provided   Education provided;Falls prevention discussed    FALL RISK PREVENTION PERTAINING TO THE HOME:  Any stairs in or around the home? No  If so, are there any without handrails? No  Home free of loose throw rugs in walkways, pet beds, electrical cords, etc? Yes  Adequate lighting in your home to reduce risk of falls? Yes   ASSISTIVE DEVICES UTILIZED TO PREVENT FALLS:  Life alert? No  Use of a cane, walker or w/c? Yes  Grab bars in the bathroom? Yes  Shower chair or bench in shower? Yes  Elevated toilet seat or a handicapped toilet? Yes       03/03/2018    3:27 PM 10/17/2014    8:42 AM  MMSE - Mini Mental State Exam  Orientation to time 5 5  Orientation to Place  5  Registration 3 3  Attention/ Calculation 4 5  Recall 2 3  Language- name 2 objects 2 2  Language- repeat 1 1  Language- follow 3 step command 3 3  Language- read & follow direction 1 1  Write a sentence 1 1  Copy design 0 1  Total score  30        09/02/2022    9:25 AM 08/29/2020    8:24 AM 08/21/2019    2:45 PM  6CIT Screen  What Year? 0 points 0 points 0 points  What month? 0 points 0 points 0 points  What time? 0 points 0 points 0 points  Count back from 20 0 points 0 points 0 points  Months in reverse 0 points 0 points 2 points  Repeat phrase 0 points 0 points 2 points  Total Score 0 points 0 points 4 points    Immunizations Immunization History  Administered Date(s) Administered   Fluad Quad(high Dose 65+) 05/04/2019, 05/27/2020, 04/20/2021   Influenza, High Dose Seasonal PF 05/06/2016, 05/06/2016, 04/14/2017, 04/14/2017, 03/30/2018, 03/30/2018   Influenza,inj,Quad PF,6+ Mos  03/28/2013, 04/24/2014, 06/20/2015   Moderna Sars-Covid-2 Vaccination 08/07/2019, 09/04/2019, 07/09/2020   Pneumococcal Conjugate-13 10/16/2014   Pneumococcal Polysaccharide-23 05/28/2006   Td 06/29/1995, 05/26/2011   Td (Adult), 2 Lf Tetanus Toxid, Preservative Free 06/29/1995, 05/26/2011   Tdap 05/26/2011   Zoster Recombinat (Shingrix) 05/05/2022    TDAP status: Due, Education has been provided regarding the importance of this vaccine. Advised may receive this vaccine at local pharmacy or Health Dept. Aware to provide a copy of the vaccination record if obtained from local pharmacy or Health Dept. Verbalized acceptance and understanding.  Flu Vaccine status: Up to date  Pneumococcal vaccine status: Up to date  Covid-19 vaccine status: Completed vaccines  Qualifies for Shingles Vaccine? Yes   Zostavax completed Yes   Shingrix Completed?: Yes  Screening Tests Health Maintenance  Topic Date Due   DEXA SCAN  04/24/2016   COVID-19 Vaccine (4 - 2023-24 season)  02/26/2022   Zoster Vaccines- Shingrix (2 of 2) 06/30/2022   INFLUENZA VACCINE  09/26/2022 (Originally 01/26/2022)   Medicare Annual Wellness (AWV)  09/02/2023   Pneumonia Vaccine 78+ Years old  Completed   HPV VACCINES  Aged Out   DTaP/Tdap/Td  Discontinued    Health Maintenance  Health Maintenance Due  Topic Date Due   DEXA SCAN  04/24/2016   COVID-19 Vaccine (4 - 2023-24 season) 02/26/2022   Zoster Vaccines- Shingrix (2 of 2) 06/30/2022    Colorectal cancer screening: No longer required.   Mammogram status: No longer required due to age.  Bone Density status: Ordered 09/02/2022. Pt provided with contact info and advised to call to schedule appt.  Lung Cancer Screening: (Low Dose CT Chest recommended if Age 41-80 years, 30 pack-year currently smoking OR have quit w/in 15years.) does not qualify.   Lung Cancer Screening Referral: n/a  Additional Screening:  Hepatitis C Screening: does not qualify;   Vision  Screening: Recommended annual ophthalmology exams for early detection of glaucoma and other disorders of the eye. Is the patient up to date with their annual eye exam?  Yes  Who is the provider or what is the name of the office in which the patient attends annual eye exams? Dr.Johnson  If pt is not established with a provider, would they like to be referred to a provider to establish care? No .   Dental Screening: Recommended annual dental exams for proper oral hygiene  Community Resource Referral / Chronic Care Management: CRR required this visit?  No   CCM required this visit?  No      Plan:     I have personally reviewed and noted the following in the patient's chart:   Medical and social history Use of alcohol, tobacco or illicit drugs  Current medications and supplements including opioid prescriptions. Patient is not currently taking opioid prescriptions. Functional ability and status Nutritional status Physical activity Advanced directives List of other physicians Hospitalizations, surgeries, and ER visits in previous 12 months Vitals Screenings to include cognitive, depression, and falls Referrals and appointments  In addition, I have reviewed and discussed with patient certain preventive protocols, quality metrics, and best practice recommendations. A written personalized care plan for preventive services as well as general preventive health recommendations were provided to patient.     Daphane Shepherd, LPN   [id number]   Nurse Notes: Due TDAp Vaccine Gerarda Gunther dose Shingrix vaccine

## 2022-09-02 NOTE — Patient Instructions (Signed)
Brenda Solis , Thank you for taking time to come for your Medicare Wellness Visit. I appreciate your ongoing commitment to your health goals. Please review the following plan we discussed and let me know if I can assist you in the future.   These are the goals we discussed:  Goals      AWV     08/29/2020 AWV Goal: Fall Prevention  Over the next year, patient will decrease their risk for falls by: Using assistive devices, such as a cane or walker, as needed Identifying fall risks within their home and correcting them by: Removing throw rugs Adding handrails to stairs or ramps Removing clutter and keeping a clear pathway throughout the home Increasing light, especially at night Adding shower handles/bars Raising toilet seat Identifying potential personal risk factors for falls: Medication side effects Incontinence/urgency Vestibular dysfunction Hearing loss Musculoskeletal disorders Neurological disorders Orthostatic hypotension           DIET - INCREASE WATER INTAKE     Exercise 150 min/wk Moderate Activity     Patient Stated     08/21/2019 AWV Goal: Fall Prevention  Over the next year, patient will decrease their risk for falls by: Using assistive devices, such as a cane or walker, as needed Identifying fall risks within their home and correcting them by: Removing throw rugs Adding handrails to stairs or ramps Removing clutter and keeping a clear pathway throughout the home Increasing light, especially at night Adding shower handles/bars Raising toilet seat Identifying potential personal risk factors for falls: Medication side effects Incontinence/urgency Vestibular dysfunction Hearing loss Musculoskeletal disorders Neurological disorders Orthostatic hypotension          This is a list of the screening recommended for you and due dates:  Health Maintenance  Topic Date Due   DEXA scan (bone density measurement)  04/24/2016   COVID-19 Vaccine (4 - 2023-24  season) 02/26/2022   Zoster (Shingles) Vaccine (2 of 2) 06/30/2022   Flu Shot  09/26/2022*   Medicare Annual Wellness Visit  09/02/2023   Pneumonia Vaccine  Completed   HPV Vaccine  Aged Out   DTaP/Tdap/Td vaccine  Discontinued  *Topic was postponed. The date shown is not the original due date.    Advanced directives: Advance directive discussed with you today. I have provided a copy for you to complete at home and have notarized. Once this is complete please bring a copy in to our office so we can scan it into your chart.   Conditions/risks identified: Aim for 30 minutes of exercise or brisk walking, 6-8 glasses of water, and 5 servings of fruits and vegetables each day.   Next appointment: Follow up in one year for your annual wellness visit    Preventive Care 65 Years and Older, Female Preventive care refers to lifestyle choices and visits with your health care provider that can promote health and wellness. What does preventive care include? A yearly physical exam. This is also called an annual well check. Dental exams once or twice a year. Routine eye exams. Ask your health care provider how often you should have your eyes checked. Personal lifestyle choices, including: Daily care of your teeth and gums. Regular physical activity. Eating a healthy diet. Avoiding tobacco and drug use. Limiting alcohol use. Practicing safe sex. Taking low-dose aspirin every day. Taking vitamin and mineral supplements as recommended by your health care provider. What happens during an annual well check? The services and screenings done by your health care provider during your annual  well check will depend on your age, overall health, lifestyle risk factors, and family history of disease. Counseling  Your health care provider may ask you questions about your: Alcohol use. Tobacco use. Drug use. Emotional well-being. Home and relationship well-being. Sexual activity. Eating habits. History  of falls. Memory and ability to understand (cognition). Work and work Statistician. Reproductive health. Screening  You may have the following tests or measurements: Height, weight, and BMI. Blood pressure. Lipid and cholesterol levels. These may be checked every 5 years, or more frequently if you are over 18 years old. Skin check. Lung cancer screening. You may have this screening every year starting at age 62 if you have a 30-pack-year history of smoking and currently smoke or have quit within the past 15 years. Fecal occult blood test (FOBT) of the stool. You may have this test every year starting at age 14. Flexible sigmoidoscopy or colonoscopy. You may have a sigmoidoscopy every 5 years or a colonoscopy every 10 years starting at age 7. Hepatitis C blood test. Hepatitis B blood test. Sexually transmitted disease (STD) testing. Diabetes screening. This is done by checking your blood sugar (glucose) after you have not eaten for a while (fasting). You may have this done every 1-3 years. Bone density scan. This is done to screen for osteoporosis. You may have this done starting at age 62. Mammogram. This may be done every 1-2 years. Talk to your health care provider about how often you should have regular mammograms. Talk with your health care provider about your test results, treatment options, and if necessary, the need for more tests. Vaccines  Your health care provider may recommend certain vaccines, such as: Influenza vaccine. This is recommended every year. Tetanus, diphtheria, and acellular pertussis (Tdap, Td) vaccine. You may need a Td booster every 10 years. Zoster vaccine. You may need this after age 82. Pneumococcal 13-valent conjugate (PCV13) vaccine. One dose is recommended after age 2. Pneumococcal polysaccharide (PPSV23) vaccine. One dose is recommended after age 61. Talk to your health care provider about which screenings and vaccines you need and how often you need  them. This information is not intended to replace advice given to you by your health care provider. Make sure you discuss any questions you have with your health care provider. Document Released: 07/11/2015 Document Revised: 03/03/2016 Document Reviewed: 04/15/2015 Elsevier Interactive Patient Education  2017 [employer] Prevention in the Home Falls can cause injuries. They can happen to people of all ages. There are many things you can do to make your home safe and to help prevent falls. What can I do on the outside of my home? Regularly fix the edges of walkways and driveways and fix any cracks. Remove anything that might make you trip as you walk through a door, such as a raised step or threshold. Trim any bushes or trees on the path to your home. Use bright outdoor lighting. Clear any walking paths of anything that might make someone trip, such as rocks or tools. Regularly check to see if handrails are loose or broken. Make sure that both sides of any steps have handrails. Any raised decks and porches should have guardrails on the edges. Have any leaves, snow, or ice cleared regularly. Use sand or salt on walking paths during winter. Clean up any spills in your garage right away. This includes oil or grease spills. What can I do in the bathroom? Use night lights. Install grab bars by the toilet and in the  tub and shower. Do not use towel bars as grab bars. Use non-skid mats or decals in the tub or shower. If you need to sit down in the shower, use a plastic, non-slip stool. Keep the floor dry. Clean up any water that spills on the floor as soon as it happens. Remove soap buildup in the tub or shower regularly. Attach bath mats securely with double-sided non-slip rug tape. Do not have throw rugs and other things on the floor that can make you trip. What can I do in the bedroom? Use night lights. Make sure that you have a light by your bed that is easy to reach. Do not use  any sheets or blankets that are too big for your bed. They should not hang down onto the floor. Have a firm chair that has side arms. You can use this for support while you get dressed. Do not have throw rugs and other things on the floor that can make you trip. What can I do in the kitchen? Clean up any spills right away. Avoid walking on wet floors. Keep items that you use a lot in easy-to-reach places. If you need to reach something above you, use a strong step stool that has a grab bar. Keep electrical cords out of the way. Do not use floor polish or wax that makes floors slippery. If you must use wax, use non-skid floor wax. Do not have throw rugs and other things on the floor that can make you trip. What can I do with my stairs? Do not leave any items on the stairs. Make sure that there are handrails on both sides of the stairs and use them. Fix handrails that are broken or loose. Make sure that handrails are as long as the stairways. Check any carpeting to make sure that it is firmly attached to the stairs. Fix any carpet that is loose or worn. Avoid having throw rugs at the top or bottom of the stairs. If you do have throw rugs, attach them to the floor with carpet tape. Make sure that you have a light switch at the top of the stairs and the bottom of the stairs. If you do not have them, ask someone to add them for you. What else can I do to help prevent falls? Wear shoes that: Do not have high heels. Have rubber bottoms. Are comfortable and fit you well. Are closed at the toe. Do not wear sandals. If you use a stepladder: Make sure that it is fully opened. Do not climb a closed stepladder. Make sure that both sides of the stepladder are locked into place. Ask someone to hold it for you, if possible. Clearly mark and make sure that you can see: Any grab bars or handrails. First and last steps. Where the edge of each step is. Use tools that help you move around (mobility aids)  if they are needed. These include: Canes. Walkers. Scooters. Crutches. Turn on the lights when you go into a dark area. Replace any light bulbs as soon as they burn out. Set up your furniture so you have a clear path. Avoid moving your furniture around. If any of your floors are uneven, fix them. If there are any pets around you, be aware of where they are. Review your medicines with your doctor. Some medicines can make you feel dizzy. This can increase your chance of falling. Ask your doctor what other things that you can do to help prevent falls. This information is  not intended to replace advice given to you by your health care provider. Make sure you discuss any questions you have with your health care provider. Document Released: 04/10/2009 Document Revised: 11/20/2015 Document Reviewed: 07/19/2014 Elsevier Interactive Patient Education  2017 [employer].

## 2022-09-02 NOTE — Progress Notes (Signed)
Rolling walker & OV notes faxed to [place] [phone]

## 2022-09-03 ENCOUNTER — Other Ambulatory Visit: Payer: Self-pay | Admitting: Family Medicine

## 2022-09-07 NOTE — Progress Notes (Signed)
[facility] does not have standing/upright rolling walkers, faxed to [facility] at [phone].

## 2022-09-28 ENCOUNTER — Other Ambulatory Visit: Payer: Self-pay | Admitting: Family Medicine

## 2022-11-09 ENCOUNTER — Ambulatory Visit (INDEPENDENT_AMBULATORY_CARE_PROVIDER_SITE_OTHER): Payer: Medicare Other

## 2022-11-09 ENCOUNTER — Ambulatory Visit (INDEPENDENT_AMBULATORY_CARE_PROVIDER_SITE_OTHER): Payer: Medicare Other | Admitting: Family Medicine

## 2022-11-09 ENCOUNTER — Other Ambulatory Visit: Payer: Self-pay | Admitting: Family Medicine

## 2022-11-09 ENCOUNTER — Encounter: Payer: Self-pay | Admitting: Family Medicine

## 2022-11-09 VITALS — BP 138/64 | HR 79 | Temp 98.6°F | Ht 68.0 in | Wt 209.0 lb

## 2022-11-09 DIAGNOSIS — F5089 Other specified eating disorder: Secondary | ICD-10-CM

## 2022-11-09 DIAGNOSIS — M8580 Other specified disorders of bone density and structure, unspecified site: Secondary | ICD-10-CM | POA: Diagnosis not present

## 2022-11-09 DIAGNOSIS — E039 Hypothyroidism, unspecified: Secondary | ICD-10-CM

## 2022-11-09 DIAGNOSIS — M7521 Bicipital tendinitis, right shoulder: Secondary | ICD-10-CM | POA: Diagnosis not present

## 2022-11-09 DIAGNOSIS — N1831 Chronic kidney disease, stage 3a: Secondary | ICD-10-CM

## 2022-11-09 DIAGNOSIS — Z780 Asymptomatic menopausal state: Secondary | ICD-10-CM

## 2022-11-09 NOTE — Progress Notes (Unsigned)
Subjective: CC:*** PCP: Brenda Ip, DO ZOX:WRUE H Ritchie is a 88 y.o. female presenting to clinic today for:  1. ***   ROS: Per HPI  Allergies  Allergen Reactions   Gabapentin Other (See Comments)    Dizzy   Oxycodone Itching   Past Medical History:  Diagnosis Date   Anemia    Anxiety    Depression    Heart murmur    Hyperlipidemia    Hypertension    Hypothyroidism    Insomnia, unspecified    Pneumonia    Spondylosis of unspecified site without mention of myelopathy    Thyroid disease    hypothyroidism    Current Outpatient Medications:    albuterol (VENTOLIN HFA) 108 (90 Base) MCG/ACT inhaler, Inhale 2 puffs into the lungs every 6 (six) hours as needed for wheezing or shortness of breath., Disp: 8 g, Rfl: 0   amoxicillin (AMOXIL) 500 MG capsule, Take 1 capsule (500 mg total) by mouth 2 (two) times daily., Disp: 20 capsule, Rfl: 0   buPROPion (WELLBUTRIN XL) 300 MG 24 hr tablet, Take 1 tablet (300 mg total) by mouth daily. For depression, Disp: 90 tablet, Rfl: 3   busPIRone (BUSPAR) 5 MG tablet, TAKE 1 TABLET BY MOUTH 2 TIMES DAILY AS NEEDED., Disp: 180 tablet, Rfl: 3   Cholecalciferol (VITAMIN D3 PO), Take 1 tablet by mouth daily., Disp: , Rfl:    diclofenac Sodium (VOLTAREN) 1 % GEL, Apply 2 g topically 3 (three) times daily as needed (pain)., Disp: 200 g, Rfl: PRN   DULoxetine (CYMBALTA) 60 MG capsule, Take 1 capsule (60 mg total) by mouth daily. For depression and chronic pain, Disp: 90 capsule, Rfl: 3   HYDROcodone-acetaminophen (NORCO/VICODIN) 5-325 MG tablet, Take 1 tablet 3 times a day by oral route as needed., Disp: , Rfl:    levothyroxine (SYNTHROID) 75 MCG tablet, TAKE 1 TABLET BY MOUTH EVERY DAY BEFORE BREAKFAST for thyroid, Disp: 90 tablet, Rfl: 3   meclizine (ANTIVERT) 25 MG tablet, TAKE ONE TABLET BY MOUTH THREE TIMES DAILY AS NEEDED FOR DIZZINESS, Disp: 30 tablet, Rfl: 2   methocarbamol (ROBAXIN) 500 MG tablet, TAKE 1 TABLET BY MOUTH EVERY 8  HOURS AS NEEDED FOR MUSCLE SPASMS., Disp: 40 tablet, Rfl: 0   Multiple Vitamin (MULTIVITAMIN WITH MINERALS) TABS tablet, Take 1 tablet by mouth daily., Disp: , Rfl:    nystatin cream (MYCOSTATIN), Apply 1 application topically 2 (two) times daily as needed (rash)., Disp: 30 g, Rfl: 2   predniSONE (DELTASONE) 20 MG tablet, 2 po at same time daily for 5 days, Disp: 10 tablet, Rfl: 0   traZODone (DESYREL) 50 MG tablet, TAKE 1-2 TABLETS (50-100 MG TOTAL) BY MOUTH AT BEDTIME AS NEEDED. 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  Tobacco Use   Smoking status: Never   Smokeless tobacco: Never  Vaping Use   Vaping Use: Never used  Substance and Sexual Activity   Alcohol use: No    Alcohol/week: 0.0 standard drinks of alcohol   Drug use: No   Sexual activity: Not Currently  Other Topics Concern   Not on file  Social History Narrative   Not on file  Social Determinants of Health   Financial Resource Strain: Low Risk  (09/02/2022)   Overall Financial Resource Strain (CARDIA)    Difficulty of Paying Living Expenses: Not hard at all  Food Insecurity: No Food Insecurity (09/02/2022)   Hunger Vital Sign    Worried About Running Out of Food in the Last Year: Never true    Ran Out of Food in the Last Year: Never true  Transportation Needs: No Transportation Needs (09/02/2022)   PRAPARE - Administrator, Civil Service (Medical): No    Lack of Transportation (Non-Medical): No  Physical Activity: Inactive (09/02/2022)   Exercise Vital Sign    Days of Exercise per Week: 0 days    Minutes of Exercise per Session: 0 min  Stress: No Stress Concern Present (09/02/2022)   [employer] of Occupational Health - Occupational Stress  Questionnaire    Feeling of Stress : Not at all  Social Connections: Moderately Isolated (09/02/2022)   Social Connection and Isolation Panel [NHANES]    Frequency of Communication with Friends and Family: More than three times a week    Frequency of Social Gatherings with Friends and Family: More than three times a week    Attends Religious Services: More than 4 times per year    Active Member of [employer] or Organizations: No    Attends Banker Meetings: Never    Marital Status: Widowed  Intimate Partner Violence: Not At Risk (09/02/2022)   Humiliation, Afraid, Rape, and Kick questionnaire    Fear of Current or Ex-Partner: No    Emotionally Abused: No    Physically Abused: No    Sexually Abused: No   Family History  Problem Relation Age of Onset   Coronary artery disease Brother        14s   Heart disease Brother    Cancer Mother        breast   Osteoporosis Mother    Heart disease Mother    Osteoporosis Maternal Aunt    Heart disease Father    Stroke Father    Esophageal varices Brother    Stroke Other        FAMILY   Esophageal varices Other        FAMILY    Objective: Office vital signs reviewed. BP (!) 153/68   Pulse 79   Temp 98.6 F (37 C)   Ht 5\' 8"  (1.727 m)   Wt 209 lb (94.8 kg)   SpO2 96%   BMI 31.78 kg/m   Physical Examination:  General: Awake, alert, *** nourished, No acute distress HEENT: Normal    Neck: No masses palpated. No lymphadenopathy    Ears: Tympanic membranes intact, normal light reflex, no erythema, no bulging    Eyes: PERRLA, extraocular membranes intact, sclera ***    Nose: nasal turbinates moist, *** nasal discharge    Throat: moist mucus membranes, no erythema, *** tonsillar exudate.  Airway is patent Cardio: regular rate and rhythm, S1S2 heard, no murmurs appreciated Pulm: clear to auscultation bilaterally, no wheezes, rhonchi or rales; normal work of breathing on room air GI: soft, non-tender, non-distended, bowel sounds  present x4, no hepatomegaly, no splenomegaly, no masses GU: external vaginal tissue ***, cervix ***, *** punctate lesions on cervix appreciated, *** discharge from cervical os, *** bleeding, *** cervical motion tenderness, *** abdominal/ adnexal masses Extremities: warm, well perfused, No edema, cyanosis or clubbing; +*** pulses bilaterally MSK: *** gait and *** station Skin: dry; intact; no rashes or  lesions Neuro: *** Strength and light touch sensation grossly intact, *** DTRs ***/4  Assessment/ Plan: 88 y.o. female   ***  No orders of the defined types were placed in this encounter.  No orders of the defined types were placed in this encounter.    Brenda Ip, DO [facility] [phone]

## 2022-11-10 ENCOUNTER — Other Ambulatory Visit: Payer: Self-pay | Admitting: Family Medicine

## 2022-11-10 DIAGNOSIS — D509 Iron deficiency anemia, unspecified: Secondary | ICD-10-CM

## 2022-11-10 DIAGNOSIS — Z780 Asymptomatic menopausal state: Secondary | ICD-10-CM | POA: Diagnosis not present

## 2022-11-10 DIAGNOSIS — M8589 Other specified disorders of bone density and structure, multiple sites: Secondary | ICD-10-CM | POA: Diagnosis not present

## 2022-11-10 LAB — IRON,TIBC AND FERRITIN PANEL
Ferritin: 14 ng/mL — ABNORMAL LOW (ref 15–150)
Iron Saturation: 27 % (ref 15–55)
Iron: 124 ug/dL (ref 27–139)
Total Iron Binding Capacity: 461 ug/dL — ABNORMAL HIGH (ref 250–450)
UIBC: 337 ug/dL (ref 118–369)

## 2022-11-10 LAB — CBC
Hematocrit: 28.3 % — ABNORMAL LOW (ref 34.0–46.6)
Hemoglobin: 8.5 g/dL — CL (ref 11.1–15.9)
MCH: 23.5 pg — ABNORMAL LOW (ref 26.6–33.0)
MCHC: 30 g/dL — ABNORMAL LOW (ref 31.5–35.7)
MCV: 78 fL — ABNORMAL LOW (ref 79–97)
Platelets: 277 10*3/uL (ref 150–450)
RBC: 3.62 x10E6/uL — ABNORMAL LOW (ref 3.77–5.28)
RDW: 16.9 % — ABNORMAL HIGH (ref 11.7–15.4)
WBC: 8.2 10*3/uL (ref 3.4–10.8)

## 2022-11-10 LAB — BASIC METABOLIC PANEL
BUN/Creatinine Ratio: 18 (ref 12–28)
BUN: 20 mg/dL (ref 8–27)
CO2: 22 mmol/L (ref 20–29)
Calcium: 9.8 mg/dL (ref 8.7–10.3)
Chloride: 105 mmol/L (ref 96–106)
Creatinine, Ser: 1.09 mg/dL — ABNORMAL HIGH (ref 0.57–1.00)
Glucose: 85 mg/dL (ref 70–99)
Potassium: 4.7 mmol/L (ref 3.5–5.2)
Sodium: 137 mmol/L (ref 134–144)
eGFR: 50 mL/min/{1.73_m2} — ABNORMAL LOW (ref >59)

## 2022-11-10 LAB — TSH: TSH: 5.64 u[IU]/mL — ABNORMAL HIGH (ref 0.450–4.500)

## 2022-11-10 LAB — T4, FREE: Free T4: 1.18 ng/dL (ref 0.82–1.77)

## 2022-11-10 MED ORDER — IRON (FERROUS SULFATE) 325 (65 FE) MG PO TABS
325.0000 mg | ORAL_TABLET | Freq: Two times a day (BID) | ORAL | 0 refills | Status: DC
Start: 2022-11-10 — End: 2022-12-03

## 2022-11-11 ENCOUNTER — Telehealth: Payer: Self-pay | Admitting: Pharmacy Technician

## 2022-11-11 NOTE — Telephone Encounter (Signed)
-----   Message from Charlett Lango sent at 11/11/2022 11:33 AM EDT -----  ----- Message ----- From: Raliegh Ip, DO Sent: 11/11/2022   9:34 AM EDT To: [facility]Prolia  Can we please see if Prolia covered for this patient?

## 2022-11-12 LAB — SPECIMEN STATUS REPORT

## 2022-11-12 NOTE — Telephone Encounter (Signed)
Prolia VOB initiated via [email or web address] 

## 2022-11-13 LAB — VITAMIN D 25 HYDROXY (VIT D DEFICIENCY, FRACTURES): Vit D, 25-Hydroxy: 41.7 ng/mL (ref 30.0–100.0)

## 2022-11-15 ENCOUNTER — Other Ambulatory Visit (HOSPITAL_COMMUNITY): Payer: Self-pay

## 2022-11-15 NOTE — Telephone Encounter (Signed)
Insurance requires failure of both an oral and IV bisphosphate for medical PA.    Patient can get it filled through pharmacy benefit, copay is $300.

## 2022-11-23 ENCOUNTER — Other Ambulatory Visit (HOSPITAL_COMMUNITY): Payer: Self-pay

## 2022-11-23 ENCOUNTER — Telehealth: Payer: Self-pay | Admitting: Family Medicine

## 2022-11-23 NOTE — Telephone Encounter (Signed)
Even with renal disease?  She has CKD

## 2022-11-23 NOTE — Telephone Encounter (Signed)
Authorization Number: [id number] 11/23/22-11/23/23

## 2022-11-23 NOTE — Telephone Encounter (Signed)
PT R/C 

## 2022-11-23 NOTE — Telephone Encounter (Signed)
Pt ready for scheduling for prolia on or after : 11/23/22  Out-of-pocket cost due at time of visit: $322  Primary: UHC-medicare Prolia co-insurance: 20% Admin fee co-insurance: $20  Secondary: --- Prolia co-insurance:  Admin fee co-insurance:   Medical Benefit Details: Date Benefits were checked: 11/12/22 Deductible: NO/ Coinsurance: 20%/ Admin Fee: $20  Prior Auth: APPROVED PA# [id number]  Expiration Date: 11/23/23   Pharmacy benefit: Copay $300 If patient wants fill through the pharmacy benefit please send prescription to: [facility], and include estimated need by date in rx notes. Pharmacy will ship medication directly to the office.  Patient not eligible for Prolia Copay Card. Copay Card can make patient's cost as little as $25. Link to apply: [email or web address]  ** This summary of benefits is an estimation of the patient's out-of-pocket cost. Exact cost may very based on individual plan coverage.

## 2022-11-23 NOTE — Telephone Encounter (Signed)
Have not submitted PA yet. Will submit PA and state contraindication because of renal disease/CKD. Will update with results.

## 2022-11-23 NOTE — Telephone Encounter (Signed)
lmtcb

## 2022-11-23 NOTE — Telephone Encounter (Signed)
She was given a steroid burst and she modified her activity.  If she thinks she needs this again, she needs to be seen.  She will also need physical therapy referral this time.

## 2022-11-24 ENCOUNTER — Other Ambulatory Visit: Payer: Medicare Other

## 2022-11-24 ENCOUNTER — Other Ambulatory Visit: Payer: Self-pay | Admitting: Family Medicine

## 2022-11-24 DIAGNOSIS — N1831 Chronic kidney disease, stage 3a: Secondary | ICD-10-CM

## 2022-11-24 DIAGNOSIS — M7521 Bicipital tendinitis, right shoulder: Secondary | ICD-10-CM

## 2022-11-24 DIAGNOSIS — D509 Iron deficiency anemia, unspecified: Secondary | ICD-10-CM

## 2022-11-24 MED ORDER — PREDNISONE 20 MG PO TABS: ORAL_TABLET | ORAL | 0 refills | Status: DC

## 2022-11-24 NOTE — Telephone Encounter (Signed)
I will make an exception to do a prescription outside of a visit this ONCE but in the future she will have to be seen.  Ongoing use of steroids is NOT safe and I DO NOT recommend repeated use of this medication to control her shoulder/ arm issue.  She needs to see her orthopedist to treat going forward if it continues to recur.

## 2022-11-24 NOTE — Telephone Encounter (Signed)
Attempted to call pt , no answer , no vm set up  

## 2022-11-25 ENCOUNTER — Other Ambulatory Visit: Payer: Medicare Other

## 2022-11-25 DIAGNOSIS — D509 Iron deficiency anemia, unspecified: Secondary | ICD-10-CM

## 2022-11-25 LAB — CBC
Hematocrit: 33.9 % — ABNORMAL LOW (ref 34.0–46.6)
Hemoglobin: 10.5 g/dL — ABNORMAL LOW (ref 11.1–15.9)
MCH: 25.3 pg — ABNORMAL LOW (ref 26.6–33.0)
MCHC: 31 g/dL — ABNORMAL LOW (ref 31.5–35.7)
MCV: 82 fL (ref 79–97)
Platelets: 261 10*3/uL (ref 150–450)
RBC: 4.15 x10E6/uL (ref 3.77–5.28)
RDW: 22.8 % — ABNORMAL HIGH (ref 11.7–15.4)
WBC: 6.9 10*3/uL (ref 3.4–10.8)

## 2022-11-25 LAB — FECAL OCCULT BLOOD, IMMUNOCHEMICAL: Fecal Occult Bld: POSITIVE — AB

## 2022-11-26 ENCOUNTER — Other Ambulatory Visit: Payer: Self-pay | Admitting: Family Medicine

## 2022-11-26 DIAGNOSIS — R195 Other fecal abnormalities: Secondary | ICD-10-CM

## 2022-11-26 DIAGNOSIS — D500 Iron deficiency anemia secondary to blood loss (chronic): Secondary | ICD-10-CM

## 2022-11-29 NOTE — Telephone Encounter (Signed)
If she still desires treatment, we can do fosamax.  Really at >88 yo, she does not have to pursue treatment.  It typically takes at least a year or 2 before she would actually benefit from the treatment.  Totally up to her.

## 2022-11-29 NOTE — Telephone Encounter (Signed)
Patient states she can not afford Prolia at this time. Do you want her to continue with the fosamax?

## 2022-11-30 ENCOUNTER — Encounter: Payer: Self-pay | Admitting: Internal Medicine

## 2022-11-30 ENCOUNTER — Ambulatory Visit: Payer: Medicare Other | Admitting: Internal Medicine

## 2022-11-30 VITALS — BP 152/60 | HR 79 | Ht 68.0 in | Wt 202.0 lb

## 2022-11-30 DIAGNOSIS — Z86010 Personal history of colonic polyps: Secondary | ICD-10-CM

## 2022-11-30 DIAGNOSIS — R195 Other fecal abnormalities: Secondary | ICD-10-CM | POA: Diagnosis not present

## 2022-11-30 DIAGNOSIS — D509 Iron deficiency anemia, unspecified: Secondary | ICD-10-CM | POA: Diagnosis not present

## 2022-11-30 MED ORDER — NA SULFATE-K SULFATE-MG SULF 17.5-3.13-1.6 GM/177ML PO SOLN: 1.0000 | Freq: Once | ORAL | 0 refills | Status: AC

## 2022-11-30 NOTE — Progress Notes (Signed)
HISTORY OF PRESENT ILLNESS:  Brenda Solis is a 88 y.o. female with past medical history as listed below.  She is sent today by her primary care provider regarding iron deficiency anemia and Hemoccult positive stool.  The patient is accompanied by her son.  Patient has a history of multiple and advanced adenomatous colon polyps.  Index colonoscopy 2001 with large tubulovillous adenoma.  Follow-up examination later this year.  Patient was late for subsequent follow-up in 2009 and found to have a large tubulovillous adenoma.  Again she was late for follow-up but seen in 2012.  At that time she had a 25 mm sessile cecal lesion which proved to be a tubulovillous adenoma.  Follow-up in 6 months recommended.  Patient did not follow-up.  She acknowledges this today.  Patient started experiencing nonspecific fatigue.  A CBC Nov 09, 2022 revealed interval anemia with a hemoglobin of 8.5 and MCV 78.  Previous hemoglobin in November 2023 was normal at 11.2.  MCV at that time 89.  She was placed on iron therapy which she takes daily.  Follow-up hemoglobin Nov 25, 2022 had improved to 10.5.  Hemoccult testing returned positive.  Patient's GI review of systems is unremarkable.  She does have rare instances of heartburn.  No dysphagia.  No abdominal pain.  No change in bowel habits from her chronic constipation.  No melena.  No hematochezia.  She does report 7 pound weight loss in the past few months.  REVIEW OF SYSTEMS:  All non-GI ROS negative except for cough, chronic back pain  Past Medical History:  Diagnosis Date   Anemia    Anxiety    Depression    Heart murmur    Hyperlipidemia    Hypertension    Hypothyroidism    Insomnia, unspecified    Pneumonia    Spondylosis of unspecified site without mention of myelopathy    Thyroid disease    hypothyroidism    Past Surgical History:  Procedure Laterality Date   APPENDECTOMY     CATARACT EXTRACTION, BILATERAL Bilateral 2006   CERVICAL DISC SURGERY   1999   CESAREAN SECTION  1962 and 1967   CHOLECYSTECTOMY  1990   DECOMPRESSIVE LUMBAR LAMINECTOMY LEVEL 1 N/A 02/01/2013   Procedure: CENTRAL DECOMPRESSIVE LUMBAR LAMINECTOMY LEVEL 1 L4-L5;  Surgeon: Jacki Cones, MD;  Location: [facility];  Service: Orthopedics;  Laterality: N/A;   DECOMPRESSIVE LUMBAR LAMINECTOMY LEVEL 1 N/A 11/20/2014   Procedure: REVISION L4-S1 DECOMPRESSION, L4-L5 IN SITU FUSION (LEVEL 1);  Surgeon: Venita Lick, MD;  Location: [facility];  Service: Orthopedics;  Laterality: N/A;   KNEE SURGERY Right 2009   meniscus repair   SPINAL CORD STIMULATOR INSERTION N/A 06/25/2020   Procedure: SPINAL CORD STIMULATOR INSERTION;  Surgeon: Venita Lick, MD;  Location: [facility];  Service: Orthopedics;  Laterality: N/A;  3 hrs   TOTAL HIP ARTHROPLASTY  2011   RIGHT   TOTAL KNEE ARTHROPLASTY Right 08/24/2021   Procedure: TOTAL KNEE ARTHROPLASTY;  Surgeon: Ollen Gross, MD;  Location: [facility];  Service: Orthopedics;  Laterality: Right;    Social History [name]  reports that she has never smoked. She has never used smokeless tobacco. She reports that she does not drink alcohol and does not use drugs.  family history includes Breast cancer in her mother; Coronary artery disease in her brother; Esophageal varices in her brother and another family member; Heart disease in her brother, father, and mother; Osteoporosis in her maternal aunt and mother; Stroke  in her father and another family member.  Allergies  Allergen Reactions   Gabapentin Other (See Comments)    Dizzy   Oxycodone Itching       PHYSICAL EXAMINATION: Vital signs: BP (!) 152/60   Pulse 79   Ht 5\' 8"  (1.727 m)   Wt 202 lb (91.6 kg)   SpO2 98%   BMI 30.71 kg/m   Constitutional: Elderly but generally well-appearing, no acute distress Psychiatric: alert and oriented x3, cooperative Eyes: extraocular movements intact, anicteric, conjunctiva pink Mouth: oral pharynx moist, no lesions Neck: supple no  lymphadenopathy Cardiovascular: heart regular rate and rhythm, no murmur Lungs: clear to auscultation bilaterally Abdomen: soft, obese, nontender, nondistended, no obvious ascites, no peritoneal signs, normal bowel sounds, no organomegaly Rectal: Deferred until colonoscopy Extremities: no clubbing, cyanosis, or lower extremity edema bilaterally Skin: no lesions on visible extremities Neuro: No focal deficits.  Cranial nerves intact  ASSESSMENT:  1.  Iron deficiency anemia.  Rule out GI mucosal lesion 2.  Hemoccult positive stool.  Rule out GI mucosal lesion 3.  History of advanced adenomatous colon polyps.  As described.  Well overdue for surveillance 4.  Advanced age and general medical problems.  Stable   PLAN:  1.  Colonoscopy to evaluate Hemoccult positive stool and iron deficiency anemia.  The patient is high risk given her age and comorbidities.The nature of the procedure, as well as the risks, benefits, and alternatives were carefully and thoroughly reviewed with the patient. Ample time for discussion and questions allowed. The patient understood, was satisfied, and agreed to proceed. 2.  Upper endoscopy to evaluate iron deficiency anemia and Hemoccult positive stool.  High risk as above.The nature of the procedure, as well as the risks, benefits, and alternatives were carefully and thoroughly reviewed with the patient. Ample time for discussion and questions allowed. The patient understood, was satisfied, and agreed to proceed. 3.  Continue iron therapy until 1 week prior to the procedures, in order to assist with colonic preparation 4.  Ongoing general medical care with PCP. A total time of 60 minutes was spent preparing to see the patient, obtaining comprehensive history, performing medically appropriate physical exam, counseling and educating the patient and her son regarding the above listed issues, ordering multiple endoscopic procedures, directing iron therapy, and documenting  clinical information in the health record

## 2022-11-30 NOTE — Patient Instructions (Signed)
You have been scheduled for an endoscopy and colonoscopy. Please follow the written instructions given to you at your visit today. Please pick up your prep supplies at the pharmacy within the next 1-3 days. If you use inhalers (even only as needed), please bring them with you on the day of your procedure.  _______________________________________________________  If your blood pressure at your visit was 140/90 or greater, please contact your primary care physician to follow up on this.  _______________________________________________________  If you are age 63 or older, your body mass index should be between 23-30. Your Body mass index is 30.71 kg/m. If this is out of the aforementioned range listed, please consider follow up with your Primary Care Provider.  If you are age 72 or younger, your body mass index should be between 19-25. Your Body mass index is 30.71 kg/m. If this is out of the aformentioned range listed, please consider follow up with your Primary Care Provider.   ________________________________________________________  The [facility] providers would like to encourage you to use [facility] to communicate with providers for non-urgent requests or questions.  Due to long hold times on the telephone, sending your provider a message by [facility] may be a faster and more efficient way to get a response.  Please allow 48 business hours for a response.  Please remember that this is for non-urgent requests.  _______________________________________________________

## 2022-11-30 NOTE — Telephone Encounter (Signed)
Pt states she will hold off on treatment for now.

## 2022-12-03 ENCOUNTER — Other Ambulatory Visit: Payer: Self-pay | Admitting: Family Medicine

## 2022-12-03 DIAGNOSIS — D509 Iron deficiency anemia, unspecified: Secondary | ICD-10-CM

## 2022-12-10 DIAGNOSIS — M25511 Pain in right shoulder: Secondary | ICD-10-CM | POA: Diagnosis not present

## 2022-12-10 DIAGNOSIS — M961 Postlaminectomy syndrome, not elsewhere classified: Secondary | ICD-10-CM | POA: Diagnosis not present

## 2022-12-10 DIAGNOSIS — M5136 Other intervertebral disc degeneration, lumbar region: Secondary | ICD-10-CM | POA: Diagnosis not present

## 2023-01-12 ENCOUNTER — Encounter: Payer: Self-pay | Admitting: Internal Medicine

## 2023-01-12 ENCOUNTER — Ambulatory Visit (AMBULATORY_SURGERY_CENTER): Payer: Medicare Other | Admitting: Internal Medicine

## 2023-01-12 ENCOUNTER — Other Ambulatory Visit: Payer: Medicare Other

## 2023-01-12 VITALS — BP 157/69 | HR 87 | Temp 98.9°F | Resp 22 | Ht 68.0 in | Wt 202.0 lb

## 2023-01-12 DIAGNOSIS — R195 Other fecal abnormalities: Secondary | ICD-10-CM

## 2023-01-12 DIAGNOSIS — E039 Hypothyroidism, unspecified: Secondary | ICD-10-CM | POA: Diagnosis not present

## 2023-01-12 DIAGNOSIS — K6389 Other specified diseases of intestine: Secondary | ICD-10-CM

## 2023-01-12 DIAGNOSIS — D124 Benign neoplasm of descending colon: Secondary | ICD-10-CM

## 2023-01-12 DIAGNOSIS — F32A Depression, unspecified: Secondary | ICD-10-CM | POA: Diagnosis not present

## 2023-01-12 DIAGNOSIS — Z86010 Personal history of colonic polyps: Secondary | ICD-10-CM | POA: Diagnosis not present

## 2023-01-12 DIAGNOSIS — D509 Iron deficiency anemia, unspecified: Secondary | ICD-10-CM | POA: Diagnosis not present

## 2023-01-12 DIAGNOSIS — Z09 Encounter for follow-up examination after completed treatment for conditions other than malignant neoplasm: Secondary | ICD-10-CM

## 2023-01-12 DIAGNOSIS — D122 Benign neoplasm of ascending colon: Secondary | ICD-10-CM | POA: Diagnosis not present

## 2023-01-12 DIAGNOSIS — C180 Malignant neoplasm of cecum: Secondary | ICD-10-CM

## 2023-01-12 LAB — CBC
HCT: 35.6 % — ABNORMAL LOW (ref 36.0–46.0)
Hemoglobin: 11.5 g/dL — ABNORMAL LOW (ref 12.0–15.0)
MCHC: 32.2 g/dL (ref 30.0–36.0)
MCV: 86 fl (ref 78.0–100.0)
Platelets: 226 10*3/uL (ref 150.0–400.0)
RBC: 4.14 Mil/uL (ref 3.87–5.11)
RDW: 20.4 % — ABNORMAL HIGH (ref 11.5–15.5)
WBC: 7.1 10*3/uL (ref 4.0–10.5)

## 2023-01-12 LAB — COMPREHENSIVE METABOLIC PANEL
ALT: 13 U/L (ref 0–35)
AST: 20 U/L (ref 0–37)
Albumin: 4 g/dL (ref 3.5–5.2)
Alkaline Phosphatase: 67 U/L (ref 39–117)
BUN: 9 mg/dL (ref 6–23)
CO2: 23 mEq/L (ref 19–32)
Calcium: 9.6 mg/dL (ref 8.4–10.5)
Chloride: 106 mEq/L (ref 96–112)
Creatinine, Ser: 0.99 mg/dL (ref 0.40–1.20)
GFR: 51.78 mL/min — ABNORMAL LOW (ref >60.00)
Glucose, Bld: 96 mg/dL (ref 70–99)
Potassium: 4.1 mEq/L (ref 3.5–5.1)
Sodium: 139 mEq/L (ref 135–145)
Total Bilirubin: 0.5 mg/dL (ref 0.2–1.2)
Total Protein: 6.5 g/dL (ref 6.0–8.3)

## 2023-01-12 LAB — FERRITIN: Ferritin: 30.4 ng/mL (ref 10.0–291.0)

## 2023-01-12 MED ORDER — SODIUM CHLORIDE 0.9 % IV SOLN: 500.0000 mL | Freq: Once | INTRAVENOUS | Status: DC

## 2023-01-12 NOTE — Op Note (Signed)
[facility] Patient Name: Brenda Solis Procedure Date: 01/12/2023 11:51 AM MRN: [id number] Endoscopist: Wilhemina Bonito. Marina Goodell , MD, [id number] Age: 88 Referring MD:  Date of Birth: 10-09-1934 Gender: Female Account #: [id number] Procedure:                Colonoscopy with cold snare polypectomy x 1;                            biopsy; submucosal injection Indications:              Heme positive stool, iron deficiency anemia, weight                            loss. Personal history of advanced adenomatous                            colon polyps. Previous colonoscopy 2001, 2001,                            2009, 2012. Lateral spreading tubulovillous adenoma                            measuring 2.5 cm removed piecemeal in 2012. Was to                            follow-up in 6 months. Did not. Medicines:                Monitored Anesthesia Care Procedure:                Pre-Anesthesia Assessment:                           - Prior to the procedure, a History and Physical                            was performed, and patient medications and                            allergies were reviewed. The patient's tolerance of                            previous anesthesia was also reviewed. The risks                            and benefits of the procedure and the sedation                            options and risks were discussed with the patient.                            All questions were answered, and informed consent                            was obtained. Prior Anticoagulants: The patient has  taken no anticoagulant or antiplatelet agents. ASA                            Grade Assessment: II - A patient with mild systemic                            disease. After reviewing the risks and benefits,                            the patient was deemed in satisfactory condition to                            undergo the procedure.                           After obtaining  informed consent, the colonoscope                            was passed under direct vision. Throughout the                            procedure, the patient's blood pressure, pulse, and                            oxygen saturations were monitored continuously. The                            CF [id number] #[id number] was introduced through the anus                            and advanced to the the cecum, identified by                            ileocecal valve. The ileocecal valve, appendiceal                            orifice, and rectum were photographed. The quality                            of the bowel preparation was excellent. The                            colonoscopy was performed without difficulty. The                            patient tolerated the procedure well. The bowel                            preparation used was SUPREP via split dose                            instruction. Scope In: 11:57:47 AM Scope Out: 12:21:55 PM Scope Withdrawal Time: 0 hours 17 minutes 6 seconds  Total Procedure Duration: 0 hours 24  minutes 8 seconds  Findings:                 An ulcerated non-obstructing large mass was found                            in the cecum. This was biopsied with a cold forceps                            for histology. A marking tattoo with carbon black                            was placed just distal (downstream toward the                            rectum) to the lesion. See images.                           Two polyps were found in the descending colon and                            ascending colon. The polyps were 5 to 10 mm in                            size. These polyps were removed with a cold snare.                            Resection and retrieval were complete.                           The exam was otherwise without abnormality on                            direct and retroflexion views. Complications:            No immediate complications. Estimated blood  loss:                            None. Estimated Blood Loss:     Estimated blood loss: none. Impression:               - Malignant tumor in the cecum. Biopsied.                           - Two 5 to 10 mm polyps in the descending colon and                            in the ascending colon, removed with a cold snare.                            Resected and retrieved.                           - The examination was otherwise normal on direct  and retroflexion views. Recommendation:           - Repeat colonoscopy in 1 year for surveillance to                            be considered.                           - Patient has a contact number available for                            emergencies. The signs and symptoms of potential                            delayed complications were discussed with the                            patient. Return to normal activities tomorrow.                            Written discharge instructions were provided to the                            patient.                           - Resume previous diet.                           - Continue present medications. Please resume iron                            supplementation twice daily.                           - Await pathology results.                           ?" CBC, comprehensive metabolic panel, ferritin and                            CEA level today                           ?" PLEASE SCHEDULE contrast-enhanced CT scan of the                            chest, abdomen, and pelvis "cecal cancer, rule out                            metastasis"                           ?" Please refer this patient to the GI oncology                            coordinator "cecal cancer"                           ?"  Please refer this patient to general surgery                            "cecal cancer, for right hemicolectomy" Wilhemina Bonito. Marina Goodell, MD 01/12/2023 12:35:03 PM This report has been signed  electronically.

## 2023-01-12 NOTE — Progress Notes (Signed)
Expand All Collapse All HISTORY OF PRESENT ILLNESS:   Brenda Solis is a 88 y.o. female with past medical history as listed below.  She is sent today by her primary care provider regarding iron deficiency anemia and Hemoccult positive stool.  The patient is accompanied by her son.   Patient has a history of multiple and advanced adenomatous colon polyps.  Index colonoscopy 2001 with large tubulovillous adenoma.  Follow-up examination later this year.  Patient was late for subsequent follow-up in 2009 and found to have a large tubulovillous adenoma.  Again she was late for follow-up but seen in 2012.  At that time she had a 25 mm sessile cecal lesion which proved to be a tubulovillous adenoma.  Follow-up in 6 months recommended.  Patient did not follow-up.  She acknowledges this today.   Patient started experiencing nonspecific fatigue.  A CBC Nov 09, 2022 revealed interval anemia with a hemoglobin of 8.5 and MCV 78.  Previous hemoglobin in November 2023 was normal at 11.2.  MCV at that time 89.  She was placed on iron therapy which she takes daily.  Follow-up hemoglobin Nov 25, 2022 had improved to 10.5.  Hemoccult testing returned positive.   Patient's GI review of systems is unremarkable.  She does have rare instances of heartburn.  No dysphagia.  No abdominal pain.  No change in bowel habits from her chronic constipation.  No melena.  No hematochezia.  She does report 7 pound weight loss in the past few months.   REVIEW OF SYSTEMS:   All non-GI ROS negative except for cough, chronic back pain       Past Medical History:  Diagnosis Date   Anemia     Anxiety     Depression     Heart murmur     Hyperlipidemia     Hypertension     Hypothyroidism     Insomnia, unspecified     Pneumonia     Spondylosis of unspecified site without mention of myelopathy     Thyroid disease      hypothyroidism               Past Surgical History:  Procedure Laterality Date   APPENDECTOMY        CATARACT EXTRACTION, BILATERAL Bilateral 2006   CERVICAL DISC SURGERY   1999   CESAREAN SECTION   1962 and 1967   CHOLECYSTECTOMY   1990   DECOMPRESSIVE LUMBAR LAMINECTOMY LEVEL 1 N/A 02/01/2013    Procedure: CENTRAL DECOMPRESSIVE LUMBAR LAMINECTOMY LEVEL 1 L4-L5;  Surgeon: Jacki Cones, MD;  Location: [facility];  Service: Orthopedics;  Laterality: N/A;   DECOMPRESSIVE LUMBAR LAMINECTOMY LEVEL 1 N/A 11/20/2014    Procedure: REVISION L4-S1 DECOMPRESSION, L4-L5 IN SITU FUSION (LEVEL 1);  Surgeon: Venita Lick, MD;  Location: [facility];  Service: Orthopedics;  Laterality: N/A;   KNEE SURGERY Right 2009    meniscus repair   SPINAL CORD STIMULATOR INSERTION N/A 06/25/2020    Procedure: SPINAL CORD STIMULATOR INSERTION;  Surgeon: Venita Lick, MD;  Location: [facility];  Service: Orthopedics;  Laterality: N/A;  3 hrs   TOTAL HIP ARTHROPLASTY   2011    RIGHT   TOTAL KNEE ARTHROPLASTY Right 08/24/2021    Procedure: TOTAL KNEE ARTHROPLASTY;  Surgeon: Ollen Gross, MD;  Location: [facility];  Service: Orthopedics;  Laterality: Right;          Social History Brenda Solis  reports that she has never smoked. She has never  used smokeless tobacco. She reports that she does not drink alcohol and does not use drugs.   family history includes Breast cancer in her mother; Coronary artery disease in her brother; Esophageal varices in her brother and another family member; Heart disease in her brother, father, and mother; Osteoporosis in her maternal aunt and mother; Stroke in her father and another family member.   Allergies       Allergies  Allergen Reactions   Gabapentin Other (See Comments)      Dizzy   Oxycodone Itching            PHYSICAL EXAMINATION: Vital signs: BP (!) 152/60   Pulse 79   Ht 5\' 8"  (1.727 m)   Wt 202 lb (91.6 kg)   SpO2 98%   BMI 30.71 kg/m   Constitutional: Elderly but generally well-appearing, no acute distress Psychiatric: alert and oriented x3, cooperative Eyes:  extraocular movements intact, anicteric, conjunctiva pink Mouth: oral pharynx moist, no lesions Neck: supple no lymphadenopathy Cardiovascular: heart regular rate and rhythm, no murmur Lungs: clear to auscultation bilaterally Abdomen: soft, obese, nontender, nondistended, no obvious ascites, no peritoneal signs, normal bowel sounds, no organomegaly Rectal: Deferred until colonoscopy Extremities: no clubbing, cyanosis, or lower extremity edema bilaterally Skin: no lesions on visible extremities Neuro: No focal deficits.  Cranial nerves intact   ASSESSMENT:   1.  Iron deficiency anemia.  Rule out GI mucosal lesion 2.  Hemoccult positive stool.  Rule out GI mucosal lesion 3.  History of advanced adenomatous colon polyps.  As described.  Well overdue for surveillance 4.  Advanced age and general medical problems.  Stable     PLAN:   1.  Colonoscopy to evaluate Hemoccult positive stool and iron deficiency anemia.  The patient is high risk given her age and comorbidities.The nature of the procedure, as well as the risks, benefits, and alternatives were carefully and thoroughly reviewed with the patient. Ample time for discussion and questions allowed. The patient understood, was satisfied, and agreed to proceed. 2.  Upper endoscopy to evaluate iron deficiency anemia and Hemoccult positive stool.  High risk as above.The nature of the procedure, as well as the risks, benefits, and alternatives were carefully and thoroughly reviewed with the patient. Ample time for discussion and questions allowed. The patient understood, was satisfied, and agreed to proceed. 3.  Continue iron therapy until 1 week prior to the procedures, in order to assist with colonic preparation 4.  Ongoing general medical care with PCP.  Recent H&P as above.  No interval change.  Now for colonoscopy

## 2023-01-12 NOTE — Patient Instructions (Addendum)
-   Return to normal activities tomorrow.  - Resume previous diet. - Continue present medications. Please resume iron supplementation twice daily. - Await pathology results.  YOU HAD AN ENDOSCOPIC PROCEDURE TODAY AT THE [facility]:   Refer to the procedure report that was given to you for any specific questions about what was found during the examination.  If the procedure report does not answer your questions, please call your gastroenterologist to clarify.  If you requested that your care partner not be given the details of your procedure findings, then the procedure report has been included in a sealed envelope for you to review at your convenience later.  YOU SHOULD EXPECT: Some feelings of bloating in the abdomen. Passage of more gas than usual.  Walking can help get rid of the air that was put into your GI tract during the procedure and reduce the bloating. If you had a lower endoscopy (such as a colonoscopy or flexible sigmoidoscopy) you may notice spotting of blood in your stool or on the toilet paper. If you underwent a bowel prep for your procedure, you may not have a normal bowel movement for a few days.  Please Note:  You might notice some irritation and congestion in your nose or some drainage.  This is from the oxygen used during your procedure.  There is no need for concern and it should clear up in a day or so.  SYMPTOMS TO REPORT IMMEDIATELY:  Following lower endoscopy (colonoscopy or flexible sigmoidoscopy):  Excessive amounts of blood in the stool  Significant tenderness or worsening of abdominal pains  Swelling of the abdomen that is new, acute  Fever of 100F or higher  For urgent or emergent issues, a gastroenterologist can be reached at any hour by calling [phone]. Do not use MyChart messaging for urgent concerns.    DIET:  We do recommend a small meal at first, but then you may proceed to your regular diet.  Drink plenty of fluids but you should avoid  alcoholic beverages for 24 hours.  ACTIVITY:  You should plan to take it easy for the rest of today and you should NOT DRIVE or use heavy machinery until tomorrow (because of the sedation medicines used during the test).    FOLLOW UP: Our staff will call the number listed on your records the next business day following your procedure.  We will call around 7:15- 8:00 am to check on you and address any questions or concerns that you may have regarding the information given to you following your procedure. If we do not reach you, we will leave a message.     If any biopsies were taken you will be contacted by phone or by letter within the next 1-3 weeks.  Please call us at [phone] if you have not heard about the biopsies in 3 weeks.    SIGNATURES/CONFIDENTIALITY: You and/or your care partner have signed paperwork which will be entered into your electronic medical record.  These signatures attest to the fact that that the information above on your After Visit Summary has been reviewed and is understood.  Full responsibility of the confidentiality of this discharge information lies with you and/or your care-partner.

## 2023-01-12 NOTE — Progress Notes (Signed)
 Called to room to assist during endoscopic procedure.  Patient ID and intended procedure confirmed with present staff. Received instructions for my participation in the procedure from the performing physician.  

## 2023-01-12 NOTE — Progress Notes (Signed)
 Vitals-DT  Pt's states no medical or surgical changes since previsit or office visit.  

## 2023-01-12 NOTE — Progress Notes (Signed)
Vss nad trans to pacu 

## 2023-01-13 ENCOUNTER — Other Ambulatory Visit: Payer: Self-pay

## 2023-01-13 ENCOUNTER — Telehealth: Payer: Self-pay | Admitting: *Deleted

## 2023-01-13 DIAGNOSIS — K6389 Other specified diseases of intestine: Secondary | ICD-10-CM

## 2023-01-13 LAB — CEA: CEA: 17.6 ng/mL — ABNORMAL HIGH

## 2023-01-13 NOTE — Telephone Encounter (Signed)
  Follow up Call-     01/12/2023   10:50 AM 01/12/2023   10:38 AM  Call back number  Post procedure Call Back phone  # [phone]   Permission to leave phone message  Yes     Patient questions:  Do you have a fever, pain , or abdominal swelling? No. Pain Score  0 *  Have you tolerated food without any problems? Yes.    Have you been able to return to your normal activities? Yes.    Do you have any questions about your discharge instructions: Diet   No. Medications  No. Follow up visit  No.  Do you have questions or concerns about your Care? No.  Actions: * If pain score is 4 or above: No action needed, pain <4.

## 2023-01-15 ENCOUNTER — Ambulatory Visit (HOSPITAL_BASED_OUTPATIENT_CLINIC_OR_DEPARTMENT_OTHER)
Admission: RE | Admit: 2023-01-15 | Discharge: 2023-01-15 | Disposition: A | Discharge status: Home / Self Care | Payer: Medicare Other | Source: Ambulatory Visit | Attending: Internal Medicine | Admitting: Internal Medicine

## 2023-01-15 DIAGNOSIS — K6389 Other specified diseases of intestine: Secondary | ICD-10-CM | POA: Diagnosis not present

## 2023-01-15 DIAGNOSIS — I700 Atherosclerosis of aorta: Secondary | ICD-10-CM | POA: Diagnosis not present

## 2023-01-15 DIAGNOSIS — C180 Malignant neoplasm of cecum: Secondary | ICD-10-CM | POA: Insufficient documentation

## 2023-01-15 MED ORDER — IOHEXOL 300 MG/ML  SOLN
100.0000 mL | Freq: Once | INTRAMUSCULAR | Status: AC | PRN
  Administered 2023-01-15: 100 mL via INTRAVENOUS

## 2023-01-17 ENCOUNTER — Ambulatory Visit (INDEPENDENT_AMBULATORY_CARE_PROVIDER_SITE_OTHER): Payer: Medicare Other | Admitting: Family Medicine

## 2023-01-17 ENCOUNTER — Encounter: Payer: Self-pay | Admitting: Internal Medicine

## 2023-01-17 VITALS — BP 131/59 | HR 78 | Temp 96.6°F | Ht 68.0 in | Wt 197.8 lb

## 2023-01-17 DIAGNOSIS — N1831 Chronic kidney disease, stage 3a: Secondary | ICD-10-CM | POA: Diagnosis not present

## 2023-01-17 DIAGNOSIS — M25471 Effusion, right ankle: Secondary | ICD-10-CM

## 2023-01-17 DIAGNOSIS — D500 Iron deficiency anemia secondary to blood loss (chronic): Secondary | ICD-10-CM | POA: Diagnosis not present

## 2023-01-17 DIAGNOSIS — I809 Phlebitis and thrombophlebitis of unspecified site: Secondary | ICD-10-CM | POA: Diagnosis not present

## 2023-01-17 DIAGNOSIS — M25472 Effusion, left ankle: Secondary | ICD-10-CM | POA: Diagnosis not present

## 2023-01-17 NOTE — Progress Notes (Signed)
Subjective: CC: Ankle edema PCP: Raliegh Ip, DO OAC:ZYSA ARMETTA Brenda Solis is a 88 y.o. female presenting to clinic today for:  1.  Ankle edema Patient is accompanied today by her daughter-in-law.  She notes that she has had about a week history of a left-sided ankle edema.  The right also swells but not as bad as the left.  Today she notes that the swelling is slightly better than it has been.  Denies any pain.  No preceding injury.  She is actively being treated and will be undergoing surgery soon for colorectal tumor.  She has had a GI bleed with anemia noted.  She also suffers from CKD.  She notes that she has been sitting more than normal.  Not utilizing compression hose.  She does try to avoid excess salt.  2.  Arm lesion Patient reports that she has a knot on the area where she had blood drawn.  Reports mild tenderness but no other concerning features   ROS: Per HPI  Allergies  Allergen Reactions   Gabapentin Other (See Comments)    Dizzy   Oxycodone Itching   Past Medical History:  Diagnosis Date   Anemia    Anxiety    Depression    Heart murmur    Hyperlipidemia    Hypertension    Hypothyroidism    Insomnia, unspecified    Pneumonia    Spondylosis of unspecified site without mention of myelopathy    Thyroid disease    hypothyroidism    Current Outpatient Medications:    buPROPion (WELLBUTRIN XL) 300 MG 24 hr tablet, Take 1 tablet (300 mg total) by mouth daily. For depression, Disp: 90 tablet, Rfl: 3   busPIRone (BUSPAR) 5 MG tablet, TAKE 1 TABLET BY MOUTH 2 TIMES DAILY AS NEEDED., Disp: 180 tablet, Rfl: 3   Cholecalciferol (VITAMIN D3 PO), Take 1 tablet by mouth daily., Disp: , Rfl:    DULoxetine (CYMBALTA) 60 MG capsule, Take 1 capsule (60 mg total) by mouth daily. For depression and chronic pain, Disp: 90 capsule, Rfl: 3   ferrous sulfate 325 (65 FE) MG tablet, TAKE 1 TAB IN THE MORNING AND 1 TAB AT BEDTIME. TAKE WITH VIT C OR A GLASS OF ORANGE JUICE, Disp: 180  tablet, Rfl: 0   HYDROcodone-acetaminophen (NORCO/VICODIN) 5-325 MG tablet, Take 1 tablet 3 times a day by oral route as needed., Disp: , Rfl:    levothyroxine (SYNTHROID) 75 MCG tablet, TAKE 1 TABLET BY MOUTH EVERY DAY BEFORE BREAKFAST for thyroid, Disp: 90 tablet, Rfl: 3   meclizine (ANTIVERT) 25 MG tablet, TAKE ONE TABLET BY MOUTH THREE TIMES DAILY AS NEEDED FOR DIZZINESS, Disp: 30 tablet, Rfl: 2   methocarbamol (ROBAXIN) 500 MG tablet, TAKE 1 TABLET BY MOUTH EVERY 8 HOURS AS NEEDED FOR MUSCLE SPASMS., Disp: 40 tablet, Rfl: 0   Multiple Vitamin (MULTIVITAMIN WITH MINERALS) TABS tablet, Take 1 tablet by mouth daily., Disp: , Rfl:    nystatin cream (MYCOSTATIN), Apply 1 application topically 2 (two) times daily as needed (rash)., Disp: 30 g, Rfl: 2   traZODone (DESYREL) 50 MG tablet, TAKE 1-2 TABLETS (50-100 MG TOTAL) BY MOUTH AT BEDTIME AS NEEDED. FOR SLEEP, Disp: 180 tablet, Rfl: 2   diclofenac Sodium (VOLTAREN) 1 % GEL, Apply 2 g topically 3 (three) times daily as needed (pain). 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  Tobacco Use   Smoking status: Never   Smokeless tobacco: Never  Vaping Use   Vaping status: Never Used  Substance and Sexual Activity   Alcohol use: No    Alcohol/week: 0.0 standard drinks of alcohol   Drug use: No   Sexual activity: Not Currently  Other Topics Concern   Not on file  Social History Narrative   Not on file   Social Determinants of Health   Financial Resource Strain: Low Risk  (01/17/2023)   Overall Financial Resource Strain (CARDIA)    Difficulty of Paying Living Expenses: Not very hard  Food Insecurity: No Food Insecurity (01/17/2023)   Hunger Vital  Sign    Worried About Running Out of Food in the Last Year: Never true    Ran Out of Food in the Last Year: Never true  Transportation Needs: No Transportation Needs (01/17/2023)   PRAPARE - Administrator, Civil Service (Medical): No    Lack of Transportation (Non-Medical): No  Physical Activity: Inactive (01/17/2023)   Exercise Vital Sign    Days of Exercise per Week: 0 days    Minutes of Exercise per Session: 0 min  Stress: No Stress Concern Present (01/17/2023)   [employer] of Occupational Health - Occupational Stress Questionnaire    Feeling of Stress : Only a little  Social Connections: Moderately Integrated (01/17/2023)   Social Connection and Isolation Panel [NHANES]    Frequency of Communication with Friends and Family: More than three times a week    Frequency of Social Gatherings with Friends and Family: Twice a week    Attends Religious Services: More than 4 times per year    Active Member of [employer] or Organizations: Yes    Attends Banker Meetings: 1 to 4 times per year    Marital Status: Widowed  Intimate Partner Violence: Not At Risk (09/02/2022)   Humiliation, Afraid, Rape, and Kick questionnaire    Fear of Current or Ex-Partner: No    Emotionally Abused: No    Physically Abused: No    Sexually Abused: No   Family History  Problem Relation Age of Onset   Breast cancer Mother        breast   Osteoporosis Mother    Heart disease Mother    Heart disease Father    Stroke Father    Coronary artery disease Brother        65s   Heart disease Brother    Esophageal varices Brother    Osteoporosis Maternal Aunt    Stroke Other        FAMILY   Esophageal varices Other        FAMILY   Colon cancer Neg Hx    Esophageal cancer Neg Hx    Pancreatic cancer Neg Hx    Stomach cancer Neg Hx     Objective: Office vital signs reviewed. BP (!) 131/59   Pulse 78   Temp (!) 96.6 F (35.9 C) (Temporal)   Ht 5\' 8"  (1.727 m)   Wt 197 lb 12.8 oz  (89.7 kg)   SpO2 92%   BMI 30.08 kg/m   Physical Examination:  General: Awake, alert, well nourished, No acute distress Cardio: Regular rate and rhythm.  S1-S2 heard Pulm: Clear  to auscultation bilaterally.  No wheezes, rhonchi or rails Extremities: warm, well perfused, 1+ pitting ankle edema left greater than right, no cyanosis or clubbing; +2 pulses bilaterally Skin: Right cubital fossa with small palpable knot at area of venous puncture MSK: Antalgic gait.  Utilizes cane for ambulation  Assessment/ Plan: 88 y.o. female   Ankle edema, bilateral  Iron deficiency anemia due to chronic blood loss  Stage 3a chronic kidney disease (HCC)  Thrombophlebitis  Suspect that the ankle edema is multifactorial.  Likely secondary to anemia due to chronic blood loss, renal dysfunction and being sedentary.  We discussed elevation of lower extremities, use of compression socks and watching salt intake.  I highly recommend that she totally avoid NSAIDs given CKD and active anemia due to colonic mass.  We discussed that the area of concern on her right elbow is thrombophlebitis secondary to recent venous sticks.  Home care instructions were reviewed and reasons for reevaluation discussed.  Both she and her daughter-in-law voiced good understanding will follow up as needed or as scheduled after surgery  No orders of the defined types were placed in this encounter.    Raliegh Ip, DO [facility] [phone]

## 2023-01-17 NOTE — Telephone Encounter (Signed)
Let them know, no obvious Metastatic disease on CT. Keep plans with surgery and oncology (who will monitor the patient moving forward) Thanks,  Dr. Demetrius Charity

## 2023-01-17 NOTE — Patient Instructions (Signed)
Thrombophlebitis Thrombophlebitis is a condition in which a blood clot and inflammation occur inside a vein. This can happen in the arms, legs, or torso. Thrombophlebitis may involve superficial veins, deep veins, or both. Superficial veins are close to the surface of the body and are part of the superficial venous system. Veins that are deeper inside the body are part of the deep venous system. When this condition happens in a superficial vein (superficial thrombophlebitis), it is usually not serious.However, when the condition happens in a vein that is part of the deep venous system (deep vein thrombosis, DVT), it can cause serious problems. What are the causes? This condition may be caused by: Infection, injury, or trauma to a vein (getting blood drawn). Inflammation of the veins. Medical conditions that can cause blood to clot more easily (hypercoagulable state). Backing up, or reflux, of blood flow through the veins (chronic venous insufficiency or venous stasis). What increases the risk? The following factors may make you more likely to develop this condition: Having a long, thin tube (catheter) put in a vein, such as a central line, port, or IV catheter. Getting certain medicines through a catheter that can irritate the vein. Pregnancy or having recently given birth. Cancer. Obesity. Taking oral contraceptive pills (OCPs) or hormone therapy (HT) medicines. Spasms of veins. Immobilization, or not moving the limbs for prolonged periods. What are the signs or symptoms? The main symptoms of this condition are: Swelling and pain in an arm or leg. If the affected vein is in the leg, you may feel pain while standing or walking. Warmth or redness in an arm or leg. Tenderness in the affected area when it is touched. Other symptoms include: Low-grade fever. Muscle aches. A bulging or hard vein (venous distension). In some cases, there are no symptoms. How is this diagnosed? This condition  may be diagnosed based on: Your symptoms and medical history. A physical exam. Tests, such as a test that uses sound waves to make images (duplex ultrasound). How is this treated? Treatment depends on how severe the condition is and which area of the body is affected. Treatment may include: Applying a warm compress or heating pad to affected areas. This may need to be repeated several times a day. Moving the affected limb. For example, you may need to start doing walking exercises right away. You will also be encouraged to continue your usual daily activities. Raising (elevating) the affected arm or leg above the level of your heart.  Follow these instructions at home: Medicines Take over-the-counter and prescription medicines only as told by your health care provider. If you are taking blood thinners: Talk with your health care provider before you take any medicines that contain aspirin or NSAIDs, such as ibuprofen. These medicines increase your risk for dangerous bleeding. Take your medicine exactly as told, at the same time every day. Avoid activities that could cause injury or bruising, and follow instructions about how to prevent falls. Wear a medical alert bracelet or carry a card that lists what medicines you take. Managing pain, stiffness, and swelling  If directed, apply heat to the affected area as often as told by your health care provider. Use the heat source that your health care provider recommends, such as a moist heat pack or a heating pad. Place a towel between your skin and the heat source. Leave the heat on for 20-30 minutes. Remove the heat if your skin turns bright red. This is especially important if you are unable to feel  pain, heat, or cold. You have a greater risk of getting burned. Elevate the affected area above the level of your heart while you are sitting or lying down. Activity Return to your normal activities as told by your health care provider. Ask your  health care provider what activities are safe for you. Avoid sitting for a long time without moving. Get up to take short walks every 1-2 hours. This is important to improve blood flow and breathing. Ask for help if you feel weak or unsteady. Do exercises as told by your health care provider. Rest as told by your health care provider. General instructions Drink enough fluid to keep your urine pale yellow. Wear compression stockings as told by your health care provider. Do not use any products that contain nicotine or tobacco. These products include cigarettes, chewing tobacco, and vaping devices, such as e-cigarettes. If you need help quitting, ask your health care provider. Keep all follow-up visits. This is important. Contact a health care provider if: You miss a dose of your blood thinner, if applicable. Your symptoms do not improve. You have unusual bruising. You have nausea, vomiting, or diarrhea that lasts for more than a day. Get help right away if: You are breathing fast or have chest pain. You have blood in your vomit, urine, or stool. You have severe pain in your affected arm or leg or new pain in any arm or leg. You have light-headedness, dizziness, a severe headache, or confusion. These symptoms may represent a serious problem that is an emergency. Do not wait to see if the symptoms will go away. Get medical help right away. Call your local emergency services (911 in the [place] Do not drive yourself to the hospital. Summary Thrombophlebitis is a condition in which a blood clot forms in a vein, causing inflammation and often pain. This can happen in both superficial and deep veins. The main symptom of this condition is swelling and pain around the affected vein. Tenderness and redness may also be present. Treatment may include warm compresses, compression stockings, anti-inflammatory medicines, or blood thinners. Make sure you take all medicines, especially blood thinners, as  instructed and keep all follow-up visits to ensure proper healing of the vein. This information is not intended to replace advice given to you by your health care provider. Make sure you discuss any questions you have with your health care provider. Document Revised: 12/08/2020 Document Reviewed: 12/08/2020 Elsevier Patient Education  2024 Elsevier Inc.  Edema Edema is when you have too much fluid in your body or under your skin. Edema may make your legs, feet, and ankles swell. Swelling often happens in looser tissues, such as around your eyes. This is a common condition. It gets more common as you get older. There are many possible causes of edema. These include: Eating too much salt (sodium). Being on your feet or sitting for a long time. Certain medical conditions, such as: Pregnancy. Heart failure. Liver disease. Kidney disease. Cancer. Hot weather may make edema worse. Edema is usually painless. Your skin may look swollen or shiny. Follow these instructions at home: Medicines Take over-the-counter and prescription medicines only as told by your doctor. Your doctor may prescribe a medicine to help your body get rid of extra water (diuretic). Take this medicine if you are told to take it. Eating and drinking Eat a low-salt (low-sodium) diet as told by your doctor. Sometimes, eating less salt may reduce swelling. Depending on the cause of your swelling, you may need  to limit how much fluid you drink (fluid restriction). General instructions Raise the injured area above the level of your heart while you are sitting or lying down. Do not sit still or stand for a long time. Do not wear tight clothes. Do not wear garters on your upper legs. Exercise your legs. This can help the swelling go down. Wear compression stockings as told by your doctor. It is important that these are the right size. These should be prescribed by your doctor to prevent possible injuries. If elastic bandages or  wraps are recommended, use them as told by your doctor. Contact a doctor if: Treatment is not working. You have heart, liver, or kidney disease and have symptoms of edema. You have sudden and unexplained weight gain. Get help right away if: You have shortness of breath or chest pain. You cannot breathe when you lie down. You have pain, redness, or warmth in the swollen areas. You have heart, liver, or kidney disease and get edema all of a sudden. You have a fever and your symptoms get worse all of a sudden. These symptoms may be an emergency. Get help right away. Call 911. Do not wait to see if the symptoms will go away. Do not drive yourself to the hospital. Summary Edema is when you have too much fluid in your body or under your skin. Edema may make your legs, feet, and ankles swell. Swelling often happens in looser tissues, such as around your eyes. Raise the injured area above the level of your heart while you are sitting or lying down. Follow your doctor's instructions about diet and how much fluid you can drink. This information is not intended to replace advice given to you by your health care provider. Make sure you discuss any questions you have with your health care provider. Document Revised: 02/16/2021 Document Reviewed: 02/16/2021 Elsevier Patient Education  2024 [employer].

## 2023-01-18 NOTE — Telephone Encounter (Signed)
See CT result note. Result has been reviewed.

## 2023-01-24 ENCOUNTER — Ambulatory Visit: Payer: Self-pay | Admitting: General Surgery

## 2023-01-24 DIAGNOSIS — C182 Malignant neoplasm of ascending colon: Secondary | ICD-10-CM | POA: Diagnosis not present

## 2023-01-24 NOTE — H&P (Signed)
REFERRING PHYSICIAN:  Roxy Cedar., MD  PROVIDER:  Elenora Gamma, MD  MRN: [id number] DOB: 09-Sep-1934 DATE OF ENCOUNTER: 01/24/2023  Subjective   Chief Complaint: No chief complaint on file.     History of Present Illness: Brenda Solis is a 88 y.o. female who is seen today as an office consultation at the request of Dr. Marina Goodell for evaluation of No chief complaint on file. .  Patient with iron deficiency anemia weight loss and heme positive stools.  She has a personal history of an advanced adenomatous colon polyp this piecemeal resected in 2012.  She was post to follow-up in 6 months but did not.  She underwent a colonoscopy on January 12, 2023.  An ulcerated large nonobstructing mass was found in the cecum.  This was biopsied and tattooed.  Several other polyps were removed as well.  Biopsy showed invasive moderately differentiated adenocarcinoma.  CT scans show a small nodule in the right lower lobe of the lung is stable adrenal nodule and no other evidence of metastatic disease in chest abdomen or pelvis.  CEA is 17.6.  Surgical history is significant for appendectomy, cholecystectomy and C-section.  She has no major medical problems.   Review of Systems: A complete review of systems was obtained from the patient.  I have reviewed this information and discussed as appropriate with the patient.  See HPI as well for other ROS.   Medical History: Past Medical History:  Diagnosis Date   Anemia    Anxiety    Arthritis    Hypertension    Thyroid disease     There is no problem list on file for this patient.   Past Surgical History:  Procedure Laterality Date   CATARACT EXTRACTION     CESAREAN SECTION     CHOLECYSTECTOMY     JOINT REPLACEMENT       Allergies  Allergen Reactions   Gabapentin Other (See Comments)    Dizzy   Oxycodone Itching    Current Outpatient Medications on File Prior to Visit  Medication Sig Dispense Refill   buPROPion (WELLBUTRIN XL)  300 MG XL tablet Take by mouth     busPIRone (BUSPAR) 5 MG tablet Take 1 tablet by mouth 2 (two) times daily as needed     DULoxetine (CYMBALTA) 60 MG DR capsule Take by mouth     ferrous sulfate 325 (65 FE) MG tablet TAKE 1 TAB IN THE MORNING AND 1 TAB AT BEDTIME. TAKE WITH VIT C OR A GLASS OF ORANGE JUICE     HYDROcodone-acetaminophen (NORCO) 5-325 mg tablet Take 1 tablet by mouth 3 (three) times daily as needed     levothyroxine (SYNTHROID) 75 MCG tablet TAKE 1 TABLET BY MOUTH EVERY DAY BEFORE BREAKFAST for thyroid     meclizine (ANTIVERT) 25 mg tablet TAKE ONE TABLET BY MOUTH THREE TIMES DAILY AS NEEDED FOR DIZZINESS     methocarbamoL (ROBAXIN) 500 MG tablet Take 1 tablet by mouth every 8 (eight) hours as needed     traZODone (DESYREL) 50 MG tablet Take 50-100 mg by mouth at bedtime as needed     cholecalciferol (VITAMIN D3) 1000 unit capsule Take 1 tablet by mouth once daily     multivitamin with minerals tablet Take 1 tablet by mouth once daily     No current facility-administered medications on file prior to visit.    Family History  Problem Relation Age of Onset   Breast cancer Mother    Stroke  Father    Coronary Artery Disease (Blocked arteries around heart) Father      Social History   Tobacco Use  Smoking Status Never  Smokeless Tobacco Never     Social History   Socioeconomic History   Marital status: Widowed  Tobacco Use   Smoking status: Never   Smokeless tobacco: Never  Vaping Use   Vaping status: Never Used  Substance and Sexual Activity   Alcohol use: Never   Drug use: Never   Social Determinants of Health   Financial Resource Strain: Low Risk  (01/17/2023)   Received from [facility]   Overall Financial Resource Strain (CARDIA)    Difficulty of Paying Living Expenses: Not very hard  Food Insecurity: No Food Insecurity (01/17/2023)   Received from [facility]   Hunger Vital Sign    Worried About Running Out of Food in the Last Year: Never true    Ran  Out of Food in the Last Year: Never true  Transportation Needs: No Transportation Needs (01/17/2023)   Received from [facility] - Transportation    Lack of Transportation (Medical): No    Lack of Transportation (Non-Medical): No  Physical Activity: Inactive (01/17/2023)   Received [facility]   Exercise Vital Sign    Days of Exercise per Week: 0 days    Minutes of Exercise per Session: 0 min  Stress: No Stress Concern Present (01/17/2023)   Received from [facility]ational [facility] - Occupational Stress Questionnaire    Feeling of Stress : Only a little  Social Connections: Moderately Integrated (01/17/2023)   Received from [facility]   Social Connection and Isolation Panel [NHANES]    Frequency of Communication with Friends and Family: More than three times a week    Frequency of Social Gatherings with Friends and Family: Twice a week    Attends Religious Services: More than 4 times per year    Active Member of [employer] or Organizations: Yes    Attends Banker Meetings: 1 to 4 times per year    Marital Status: Widowed    Objective:    Vitals:   01/24/23 0946 01/24/23 0947  BP: (!) 152/84   Pulse: 86   Temp: 36.7 C (98 F)   SpO2: 93%   Weight: 88.8 kg (195 lb 12.8 oz)   Height: 172.7 cm (5\' 8" )   PainSc:  0-No pain     Exam Gen: NAD CV: RRR Lungs: CTA Abd: soft    Labs, Imaging and Diagnostic Testing: Colonoscopy report reviewed.  CT scan images and report reviewed.  Assessment and Plan:  Diagnoses and all orders for this visit:  Colon cancer, ascending (CMS/HHS-HCC)    88 year old female who presents to the office for evaluation of colon cancer found on recent colonoscopy due to iron deficiency anemia.  On CT scan the colon cancer appears to be resectable.  She does have a history of an open cholecystectomy, which will make things possibly a bit more difficult.  We discussed the added risk of bleeding and  damage to adjacent structures after the surgery.  All questions were answered.  We will proceed with surgery soon as possible. The surgery and anatomy were described to the patient as well as the risks of surgery and the possible complications.  These include: Bleeding, deep abdominal infections and possible wound complications such as hernia and infection, damage to adjacent structures, leak of surgical connections, which can lead  to other surgeries and possibly an ostomy, possible need for other procedures, such as abscess drains in radiology, possible prolonged hospital stay, possible diarrhea from removal of part of the colon, possible constipation from narcotics, possible bowel, bladder or sexual dysfunction if having rectal surgery, prolonged fatigue/weakness or appetite loss, possible early recurrence of of disease, possible complications of their medical problems such as heart disease or arrhythmias or lung problems, death (less than 1%). I believe the patient understands and wishes to proceed with the surgery.   Vanita Panda, MD Colon and Rectal Surgery [facility]

## 2023-01-24 NOTE — H&P (View-Only) (Signed)
REFERRING PHYSICIAN:  Roxy Cedar., MD  PROVIDER:  Elenora Gamma, MD  MRN: [id number] DOB: 09-Sep-1934 DATE OF ENCOUNTER: 01/24/2023  Subjective   Chief Complaint: No chief complaint on file.     History of Present Illness: Brenda Solis is a 87 y.o. female who is seen today as an office consultation at the request of Dr. Marina Goodell for evaluation of No chief complaint on file. .  Patient with iron deficiency anemia weight loss and heme positive stools.  She has a personal history of an advanced adenomatous colon polyp this piecemeal resected in 2012.  She was post to follow-up in 6 months but did not.  She underwent a colonoscopy on January 12, 2023.  An ulcerated large nonobstructing mass was found in the cecum.  This was biopsied and tattooed.  Several other polyps were removed as well.  Biopsy showed invasive moderately differentiated adenocarcinoma.  CT scans show a small nodule in the right lower lobe of the lung is stable adrenal nodule and no other evidence of metastatic disease in chest abdomen or pelvis.  CEA is 17.6.  Surgical history is significant for appendectomy, cholecystectomy and C-section.  She has no major medical problems.   Review of Systems: A complete review of systems was obtained from the patient.  I have reviewed this information and discussed as appropriate with the patient.  See HPI as well for other ROS.   Medical History: Past Medical History:  Diagnosis Date   Anemia    Anxiety    Arthritis    Hypertension    Thyroid disease     There is no problem list on file for this patient.   Past Surgical History:  Procedure Laterality Date   CATARACT EXTRACTION     CESAREAN SECTION     CHOLECYSTECTOMY     JOINT REPLACEMENT       Allergies  Allergen Reactions   Gabapentin Other (See Comments)    Dizzy   Oxycodone Itching    Current Outpatient Medications on File Prior to Visit  Medication Sig Dispense Refill   buPROPion (WELLBUTRIN XL)  300 MG XL tablet Take by mouth     busPIRone (BUSPAR) 5 MG tablet Take 1 tablet by mouth 2 (two) times daily as needed     DULoxetine (CYMBALTA) 60 MG DR capsule Take by mouth     ferrous sulfate 325 (65 FE) MG tablet TAKE 1 TAB IN THE MORNING AND 1 TAB AT BEDTIME. TAKE WITH VIT C OR A GLASS OF ORANGE JUICE     HYDROcodone-acetaminophen (NORCO) 5-325 mg tablet Take 1 tablet by mouth 3 (three) times daily as needed     levothyroxine (SYNTHROID) 75 MCG tablet TAKE 1 TABLET BY MOUTH EVERY DAY BEFORE BREAKFAST for thyroid     meclizine (ANTIVERT) 25 mg tablet TAKE ONE TABLET BY MOUTH THREE TIMES DAILY AS NEEDED FOR DIZZINESS     methocarbamoL (ROBAXIN) 500 MG tablet Take 1 tablet by mouth every 8 (eight) hours as needed     traZODone (DESYREL) 50 MG tablet Take 50-100 mg by mouth at bedtime as needed     cholecalciferol (VITAMIN D3) 1000 unit capsule Take 1 tablet by mouth once daily     multivitamin with minerals tablet Take 1 tablet by mouth once daily     No current facility-administered medications on file prior to visit.    Family History  Problem Relation Age of Onset   Breast cancer Mother    Stroke  Father    Coronary Artery Disease (Blocked arteries around heart) Father      Social History   Tobacco Use  Smoking Status Never  Smokeless Tobacco Never     Social History   Socioeconomic History   Marital status: Widowed  Tobacco Use   Smoking status: Never   Smokeless tobacco: Never  Vaping Use   Vaping status: Never Used  Substance and Sexual Activity   Alcohol use: Never   Drug use: Never   Social Determinants of Health   Financial Resource Strain: Low Risk  (01/17/2023)   Received from [facility]   Overall Financial Resource Strain (CARDIA)    Difficulty of Paying Living Expenses: Not very hard  Food Insecurity: No Food Insecurity (01/17/2023)   Received from [facility]   Hunger Vital Sign    Worried About Running Out of Food in the Last Year: Never true    Ran  Out of Food in the Last Year: Never true  Transportation Needs: No Transportation Needs (01/17/2023)   Received from [facility] - Transportation    Lack of Transportation (Medical): No    Lack of Transportation (Non-Medical): No  Physical Activity: Inactive (01/17/2023)   Received [facility]   Exercise Vital Sign    Days of Exercise per Week: 0 days    Minutes of Exercise per Session: 0 min  Stress: No Stress Concern Present (01/17/2023)   [facility] from [facility] - Occupational Stress Questionnaire    Feeling of Stress : Only a little  Social Connections: Moderately Integrated (01/17/2023)   Received from [facility]   Social Connection and Isolation Panel [NHANES]    Frequency of Communication with Friends and Family: More than three times a week    Frequency of Social Gatherings with Friends and Family: Twice a week    Attends Religious Services: More than 4 times per year    Active Member of [employer] or Organizations: Yes    Attends Banker Meetings: 1 to 4 times per year    Marital Status: Widowed    Objective:    Vitals:   01/24/23 0946 01/24/23 0947  BP: (!) 152/84   Pulse: 86   Temp: 36.7 C (98 F)   SpO2: 93%   Weight: 88.8 kg (195 lb 12.8 oz)   Height: 172.7 cm (5\' 8" )   PainSc:  0-No pain     Exam Gen: NAD CV: RRR Lungs: CTA Abd: soft    Labs, Imaging and Diagnostic Testing: Colonoscopy report reviewed.  CT scan images and report reviewed.  Assessment and Plan:  Diagnoses and all orders for this visit:  Colon cancer, ascending (CMS/HHS-HCC)    88 year old female who presents to the office for evaluation of colon cancer found on recent colonoscopy due to iron deficiency anemia.  On CT scan the colon cancer appears to be resectable.  She does have a history of an open cholecystectomy, which will make things possibly a bit more difficult.  We discussed the added risk of bleeding and  damage to adjacent structures after the surgery.  All questions were answered.  We will proceed with surgery soon as possible. The surgery and anatomy were described to the patient as well as the risks of surgery and the possible complications.  These include: Bleeding, deep abdominal infections and possible wound complications such as hernia and infection, damage to adjacent structures, leak of surgical connections, which can lead  to other surgeries and possibly an ostomy, possible need for other procedures, such as abscess drains in radiology, possible prolonged hospital stay, possible diarrhea from removal of part of the colon, possible constipation from narcotics, possible bowel, bladder or sexual dysfunction if having rectal surgery, prolonged fatigue/weakness or appetite loss, possible early recurrence of of disease, possible complications of their medical problems such as heart disease or arrhythmias or lung problems, death (less than 1%). I believe the patient understands and wishes to proceed with the surgery.   Vanita Panda, MD Colon and Rectal Surgery [facility]

## 2023-01-25 NOTE — Patient Instructions (Signed)
DUE TO COVID-19 ONLY TWO VISITORS  (aged 86 and older)  ARE ALLOWED TO COME WITH YOU AND STAY IN THE WAITING ROOM ONLY DURING PRE OP AND PROCEDURE.   **NO VISITORS ARE ALLOWED IN THE SHORT STAY AREA OR RECOVERY ROOM!!**  IF YOU WILL BE ADMITTED INTO THE HOSPITAL YOU ARE ALLOWED ONLY FOUR SUPPORT PEOPLE DURING VISITATION HOURS ONLY (7 AM -8PM)   The support person(s) must pass our screening, gel in and out, and wear a mask at all times, including in the patient's room. Patients must also wear a mask when staff or their support person are in the room. Visitors GUEST BADGE MUST BE WORN VISIBLY  One adult visitor may remain with you overnight and MUST be in the room by 8 P.M.   02/02/23  Your procedure is scheduled on:    Report to [facility] Main Entrance    Report to admitting at : 12:15 PM   Call this number if you have problems the morning of surgery [phone]    Clear liquids starting the day before surgery until : 11:30 AM DAY OF SURGERY. Drink plenty clear liquids the day of the prep.  Water Black Coffee (sugar ok, NO MILK/CREAM OR CREAMERS)  Tea (sugar ok, NO MILK/CREAM OR CREAMERS) regular and decaf                             Plain Jell-O (NO RED)                                           Fruit ices (not with fruit pulp, NO RED)                                     Popsicles (NO RED)                                                                  Juice: apple, WHITE grape, WHITE cranberry Sports drinks like Gatorade (NO RED)              Drink 2 Ensure/G2 drinks AT 10:00 PM the night before surgery.     The day of surgery:  Drink ONE (1) Pre-Surgery Clear Ensure at : 11:30 AM the morning of surgery. Drink in one sitting. Do not sip.  This drink was given to you during your hospital  pre-op appointment visit. Nothing else to drink after completing the  Pre-Surgery Clear Ensure or G2.          If you have questions, please contact your surgeon's office.  FOLLOW  BOWEL PREP AND ANY ADDITIONAL PRE OP INSTRUCTIONS YOU RECEIVED FROM YOUR SURGEON'S OFFICE!!!   Oral Hygiene is also important to reduce your risk of infection.                                    Remember - BRUSH YOUR TEETH THE MORNING OF SURGERY WITH YOUR REGULAR TOOTHPASTE  DENTURES  WILL BE REMOVED PRIOR TO SURGERY PLEASE DO NOT APPLY "Poly grip" OR ADHESIVES!!!   Do NOT smoke after Midnight   Take these medicines the morning of surgery with A SIP OF WATER: bupropion,duloxetine,levothyroxine.Buspirone,meclizine as needed.                              You may not have any metal on your body including hair pins, jewelry, and body piercing             Do not wear make-up, lotions, powders, perfumes/cologne, or deodorant  Do not wear nail polish including gel and S&S, artificial/acrylic nails, or any other type of covering on natural nails including finger and toenails. If you have artificial nails, gel coating, etc. that needs to be removed by a nail salon please have this removed prior to surgery or surgery may need to be canceled/ delayed if the surgeon/ anesthesia feels like they are unable to be safely monitored.   Do not shave  48 hours prior to surgery.    Do not bring valuables to the hospital. [facility] IS NOT             RESPONSIBLE   FOR VALUABLES.   Contacts, glasses, or bridgework may not be worn into surgery.   Bring small overnight bag day of surgery.   DO NOT BRING YOUR HOME MEDICATIONS TO THE HOSPITAL. PHARMACY WILL DISPENSE MEDICATIONS LISTED ON YOUR MEDICATION LIST TO YOU DURING YOUR ADMISSION IN THE HOSPITAL!    Patients discharged on the day of surgery will not be allowed to drive home.  Someone NEEDS to stay with you for the first 24 hours after anesthesia.   Special Instructions: Bring a copy of your healthcare power of attorney and living will documents         the day of surgery if you haven't scanned them before.              Please read over the following  fact sheets you were given: IF YOU HAVE QUESTIONS ABOUT YOUR PRE-OP INSTRUCTIONS PLEASE CALL [phone]    [facility] - Preparing for Surgery Before surgery, you can play an important role.  Because skin is not sterile, your skin needs to be as free of germs as possible.  You can reduce the number of germs on your skin by washing with CHG (chlorahexidine gluconate) soap before surgery.  CHG is an antiseptic cleaner which kills germs and bonds with the skin to continue killing germs even after washing. Please DO NOT use if you have an allergy to CHG or antibacterial soaps.  If your skin becomes reddened/irritated stop using the CHG and inform your nurse when you arrive at Short Stay. Do not shave (including legs and underarms) for at least 48 hours prior to the first CHG shower.  You may shave your face/neck. Please follow these instructions carefully:  1.  Shower with CHG Soap the night before surgery and the  morning of Surgery.  2.  If you choose to wash your hair, wash your hair first as usual with your  normal  shampoo.  3.  After you shampoo, rinse your hair and body thoroughly to remove the  shampoo.                           4.  Use CHG as you would any other liquid soap.  You can  apply chg directly  to the skin and wash                       Gently with a scrungie or clean washcloth.  5.  Apply the CHG Soap to your body ONLY FROM THE NECK DOWN.   Do not use on face/ open                           Wound or open sores. Avoid contact with eyes, ears mouth and genitals (private parts).                       Wash face,  Genitals (private parts) with your normal soap.             6.  Wash thoroughly, paying special attention to the area where your surgery  will be performed.  7.  Thoroughly rinse your body with warm water from the neck down.  8.  DO NOT shower/wash with your normal soap after using and rinsing off  the CHG Soap.                9.  Pat yourself dry with a clean towel.             10.  Wear clean pajamas.            11.  Place clean sheets on your bed the night of your first shower and do not  sleep with pets. Day of Surgery : Do not apply any lotions/deodorants the morning of surgery.  Please wear clean clothes to the hospital/surgery center.  FAILURE TO FOLLOW THESE INSTRUCTIONS MAY RESULT IN THE CANCELLATION OF YOUR SURGERY PATIENT SIGNATURE_________________________________  NURSE SIGNATURE__________________________________  ________________________________________________________________________ WHAT IS A BLOOD TRANSFUSION? Blood Transfusion Information  A transfusion is the replacement of blood or some of its parts. Blood is made up of multiple cells which provide different functions. Red blood cells carry oxygen and are used for blood loss replacement. White blood cells fight against infection. Platelets control bleeding. Plasma helps clot blood. Other blood products are available for specialized needs, such as hemophilia or other clotting disorders. BEFORE THE TRANSFUSION  Who gives blood for transfusions?  Healthy volunteers who are fully evaluated to make sure their blood is safe. This is blood bank blood. Transfusion therapy is the safest it has ever been in the practice of medicine. Before blood is taken from a donor, a complete history is taken to make sure that person has no history of diseases nor engages in risky social behavior (examples are intravenous drug use or sexual activity with multiple partners). The donor's travel history is screened to minimize risk of transmitting infections, such as malaria. The donated blood is tested for signs of infectious diseases, such as HIV and hepatitis. The blood is then tested to be sure it is compatible with you in order to minimize the chance of a transfusion reaction. If you or a relative donates blood, this is often done in anticipation of surgery and is not appropriate for emergency situations. It takes many  days to process the donated blood. RISKS AND COMPLICATIONS Although transfusion therapy is very safe and saves many lives, the main dangers of transfusion include:  Getting an infectious disease. Developing a transfusion reaction. This is an allergic reaction to something in the blood you were given. Every precaution is taken to prevent this. The decision to  have a blood transfusion has been considered carefully by your caregiver before blood is given. Blood is not given unless the benefits outweigh the risks. AFTER THE TRANSFUSION Right after receiving a blood transfusion, you will usually feel much better and more energetic. This is especially true if your red blood cells have gotten low (anemic). The transfusion raises the level of the red blood cells which carry oxygen, and this usually causes an energy increase. The nurse administering the transfusion will monitor you carefully for complications. HOME CARE INSTRUCTIONS  No special instructions are needed after a transfusion. You may find your energy is better. Speak with your caregiver about any limitations on activity for underlying diseases you may have. SEEK MEDICAL CARE IF:  Your condition is not improving after your transfusion. You develop redness or irritation at the intravenous (IV) site. SEEK IMMEDIATE MEDICAL CARE IF:  Any of the following symptoms occur over the next 12 hours: Shaking chills. You have a temperature by mouth above 102 F (38.9 C), not controlled by medicine. Chest, back, or muscle pain. People around you feel you are not acting correctly or are confused. Shortness of breath or difficulty breathing. Dizziness and fainting. You get a rash or develop hives. You have a decrease in urine output. Your urine turns a dark color or changes to pink, red, or brown. Any of the following symptoms occur over the next 10 days: You have a temperature by mouth above 102 F (38.9 C), not controlled by medicine. Shortness of  breath. Weakness after normal activity. The white part of the eye turns yellow (jaundice). You have a decrease in the amount of urine or are urinating less often. Your urine turns a dark color or changes to pink, red, or brown. Document Released: 06/11/2000 Document Revised: 09/06/2011 Document Reviewed: 01/29/2008 [facility] Patient Information 2014 [place], [place].  _______________________________________________________________________

## 2023-01-27 ENCOUNTER — Other Ambulatory Visit: Payer: Self-pay

## 2023-01-27 ENCOUNTER — Encounter (HOSPITAL_COMMUNITY): Payer: Self-pay

## 2023-01-27 ENCOUNTER — Encounter (HOSPITAL_COMMUNITY)
Admission: RE | Admit: 2023-01-27 | Discharge: 2023-01-27 | Disposition: A | Discharge status: Home / Self Care | Payer: Medicare Other | Source: Ambulatory Visit | Attending: General Surgery | Admitting: General Surgery

## 2023-01-27 VITALS — BP 148/53 | HR 78 | Temp 98.6°F | Wt 191.0 lb

## 2023-01-27 DIAGNOSIS — Z6829 Body mass index (BMI) 29.0-29.9, adult: Secondary | ICD-10-CM | POA: Diagnosis not present

## 2023-01-27 DIAGNOSIS — F32A Depression, unspecified: Secondary | ICD-10-CM | POA: Insufficient documentation

## 2023-01-27 DIAGNOSIS — E039 Hypothyroidism, unspecified: Secondary | ICD-10-CM | POA: Diagnosis not present

## 2023-01-27 DIAGNOSIS — E785 Hyperlipidemia, unspecified: Secondary | ICD-10-CM | POA: Diagnosis not present

## 2023-01-27 DIAGNOSIS — E669 Obesity, unspecified: Secondary | ICD-10-CM | POA: Insufficient documentation

## 2023-01-27 DIAGNOSIS — G4700 Insomnia, unspecified: Secondary | ICD-10-CM | POA: Diagnosis not present

## 2023-01-27 DIAGNOSIS — I10 Essential (primary) hypertension: Secondary | ICD-10-CM

## 2023-01-27 DIAGNOSIS — C189 Malignant neoplasm of colon, unspecified: Secondary | ICD-10-CM | POA: Diagnosis not present

## 2023-01-27 DIAGNOSIS — Z01818 Encounter for other preprocedural examination: Secondary | ICD-10-CM | POA: Diagnosis not present

## 2023-01-27 HISTORY — DX: Malignant (primary) neoplasm, unspecified: C80.1

## 2023-01-27 LAB — TYPE AND SCREEN: ABO/RH(D): A POS

## 2023-01-27 NOTE — Progress Notes (Addendum)
For Short Stay: COVID SWAB appointment date:  Bowel Prep reminder: Reviewed.   For Anesthesia: PCP - DO: Raliegh Ip, DO . LOV: 01/17/23 Cardiologist - N/A CT chest: 01/15/23. Chest x-ray -  EKG - 01/27/23 Stress Test -  ECHO - 2015 Cardiac Cath -  Pacemaker/ICD device last checked: Pacemaker orders received: Device Rep notified:  Spinal Cord Stimulator: Yes  Sleep Study - N/A CPAP -   Fasting Blood Sugar - N/A Checks Blood Sugar _____ times a day Date and result of last Hgb A1c-  Last dose of GLP1 agonist- N/A GLP1 instructions:   Last dose of SGLT-2 inhibitors- N/A SGLT-2 instructions:   Blood Thinner Instructions:N/A Aspirin Instructions: Last Dose:  Activity level: Can go up a flight of stairs and activities of daily living without stopping and without chest pain and/or shortness of breath   Able to exercise without chest pain and/or shortness of breath   Unable to go up a flight of stairs due to weakness on her legs.   Anesthesia review: Hx: HTN,Heart murmur.  Patient denies shortness of breath, fever, cough and chest pain at PAT appointment   Patient verbalized understanding of instructions that were given to them at the PAT appointment. Patient was also instructed that they will need to review over the PAT instructions again at home before surgery.

## 2023-01-28 NOTE — Anesthesia Preprocedure Evaluation (Signed)
Anesthesia Evaluation  Patient identified by MRN, date of birth, ID band Patient awake    Reviewed: Allergy & Precautions, NPO status , Patient's Chart, lab work & pertinent test results  Airway Mallampati: III       Dental no notable dental hx. (+) Teeth Intact, Dental Advisory Given   Pulmonary neg pulmonary ROS   Pulmonary exam normal        Cardiovascular hypertension, Normal cardiovascular exam     Neuro/Psych  PSYCHIATRIC DISORDERS Anxiety Depression       GI/Hepatic negative GI ROS, Neg liver ROS,,,  Endo/Other  Hypothyroidism    Renal/GU negative Renal ROS  negative genitourinary   Musculoskeletal  (+) Arthritis , Osteoarthritis,  Back surgery x2 2014 and 2016, spinal cord stim 2021   Abdominal Normal abdominal exam  (+)   Peds  Hematology hct 38.9, plt 219   Anesthesia Other Findings   Reproductive/Obstetrics negative OB ROS                             Anesthesia Physical Anesthesia Plan  ASA: 2  Anesthesia Plan: General   Post-op Pain Management:    Induction: Intravenous  PONV Risk Score and Plan: 3 and Ondansetron, Dexamethasone, Treatment may vary due to age or medical condition and Scopolamine patch - Pre-op  Airway Management Planned: Oral ETT  Additional Equipment: None  Intra-op Plan:   Post-operative Plan: Extubation in OR  Informed Consent: I have reviewed the patients History and Physical, chart, labs and discussed the procedure including the risks, benefits and alternatives for the proposed anesthesia with the patient or authorized representative who has indicated his/her understanding and acceptance.     Dental advisory given  Plan Discussed with: CRNA  Anesthesia Plan Comments: (See PAT note from 8/1 by Sherlie Ban PA-C )        Anesthesia Quick Evaluation

## 2023-01-28 NOTE — Progress Notes (Signed)
Case: [id number] Date/Time: 02/02/23 1415   Procedure: XI ROBOT ASSISTED LAPAROSCOPIC PARTIAL COLECTOMY   Anesthesia type: General   Pre-op diagnosis: COLON CANCER   Location: [facility] ROOM 02 / [facility]   Surgeons: Romie Levee, MD       DISCUSSION: Brenda Solis is an 88 yo female who presents to PAT prior to surgery above. PMH significant for colon cancer, HTN, HLD, murmur, hypothyroidism, insomnia, depression, right THA (08/19/09), neck surgery (1999), back surgery (L3-5 laminectomy, L4-5 foraminotomies 02/01/13; L4-S1 decompression, in situ arthrodesis L4-5 11/21/14). BMI is consistent with obesity.   No prior anesthesia complications  Patient is followed by her PCP for chronic medical problems. Last seen on 01/17/23. All issues appear to be stable/controlled. IDA is attributed to her colon cancer. BP is controlled. She has seen Cardiology remotely (in 2012) for syncope w/u which was unremarkable.   VS: BP (!) 148/53   Pulse 78   Temp 37 C (Oral)   Wt 86.6 kg   SpO2 100%   BMI 29.04 kg/m   PROVIDERS: Raliegh Ip, DO   LABS: Labs reviewed: Acceptable for surgery. (all labs ordered are listed, but only abnormal results are displayed)  Labs Reviewed  TYPE AND SCREEN     IMAGES:  CT chest/abdomen/pelvis 01/15/23:  IMPRESSION: 1. Irregular cecal wall thickening with adjacent soft tissue stranding, compatible with known cecal malignancy. 2. Prominent ileocolic lymph nodes measure up to 6 mm, likely reflecting local nodal disease involvement. 3. A 4 mm subsolid nodule in the right lower lobe is favored a benign infectious or inflammatory etiology but technically indeterminate. Suggest attention on short-term interval follow-up chest CT 4. A 7 mm left adrenal nodule appears to have been present on CT lumbar spine October 01, 2014 although difficult to definitively assess on the remote examination secondary to technique but this is suggestive of a benign etiology. Suggest  attention on follow-up imaging 5. No convincing evidence of metastatic disease in the chest, abdomen or pelvis. 6. Aortic Atherosclerosis (ICD10-I70.0).   EKG 01/27/23:   NSR, rate 75 LAD LVH Artifact  CV:  Stress echo 11/30/10 (Scan from 11/29/13 is also the 2012 stress echo):  Stress echo results: Normal dobutamine echo. There is no evidence of inducible ischemia.   Cardiac event monitor 11/17/10-12/07/10: Sinus rhythm  Past Medical History:  Diagnosis Date   Anemia    Anxiety    Cancer (HCC)    Depression    Heart murmur    Hyperlipidemia    Hypertension    Hypothyroidism    Insomnia, unspecified    Pneumonia    Spondylosis of unspecified site without mention of myelopathy    Thyroid disease    hypothyroidism    Past Surgical History:  Procedure Laterality Date   APPENDECTOMY     CATARACT EXTRACTION, BILATERAL Bilateral 2006   CERVICAL DISC SURGERY  1999   CESAREAN SECTION  1962 and 1967   CHOLECYSTECTOMY  1990   DECOMPRESSIVE LUMBAR LAMINECTOMY LEVEL 1 N/A 02/01/2013   Procedure: CENTRAL DECOMPRESSIVE LUMBAR LAMINECTOMY LEVEL 1 L4-L5;  Surgeon: Jacki Cones, MD;  Location: [facility];  Service: Orthopedics;  Laterality: N/A;   DECOMPRESSIVE LUMBAR LAMINECTOMY LEVEL 1 N/A 11/20/2014   Procedure: REVISION L4-S1 DECOMPRESSION, L4-L5 IN SITU FUSION (LEVEL 1);  Surgeon: Venita Lick, MD;  Location: [facility];  Service: Orthopedics;  Laterality: N/A;   KNEE SURGERY Right 2009   meniscus repair   SPINAL CORD STIMULATOR INSERTION N/A 06/25/2020   Procedure: SPINAL CORD STIMULATOR  INSERTION;  Surgeon: Venita Lick, MD;  Location: [facility] OR;  Service: Orthopedics;  Laterality: N/A;  3 hrs   TOTAL HIP ARTHROPLASTY  2011   RIGHT   TOTAL KNEE ARTHROPLASTY Right 08/24/2021   Procedure: TOTAL KNEE ARTHROPLASTY;  Surgeon: Ollen Gross, MD;  Location: [facility];  Service: Orthopedics;  Laterality: Right;    MEDICATIONS:  Ascorbic Acid (VITAMIN C PO)   buPROPion (WELLBUTRIN XL) 300 MG  24 hr tablet   busPIRone (BUSPAR) 5 MG tablet   CALCIUM PO   Cholecalciferol (VITAMIN D3 PO)   diclofenac Sodium (VOLTAREN) 1 % GEL   docusate sodium (COLACE) 100 MG capsule   DULoxetine (CYMBALTA) 60 MG capsule   ferrous sulfate 325 (65 FE) MG tablet   HYDROcodone-acetaminophen (NORCO/VICODIN) 5-325 MG tablet   levothyroxine (SYNTHROID) 75 MCG tablet   meclizine (ANTIVERT) 25 MG tablet   methocarbamol (ROBAXIN) 500 MG tablet   Multiple Vitamin (MULTIVITAMIN WITH MINERALS) TABS tablet   nystatin cream (MYCOSTATIN)   traZODone (DESYREL) 50 MG tablet   No current facility-administered medications for this encounter.   Marcille Blanco [facility] Short Stay/Anesthesiology [facility] Phone [phone] 01/28/2023 2:38 PM

## 2023-02-02 ENCOUNTER — Encounter (HOSPITAL_COMMUNITY): Payer: Self-pay | Admitting: General Surgery

## 2023-02-02 ENCOUNTER — Other Ambulatory Visit: Payer: Self-pay

## 2023-02-02 ENCOUNTER — Inpatient Hospital Stay (HOSPITAL_COMMUNITY): Payer: Medicare Other | Admitting: Certified Registered Nurse Anesthetist

## 2023-02-02 ENCOUNTER — Encounter (HOSPITAL_COMMUNITY)
Admission: RE | Disposition: A | Discharge status: Home / Self Care | Payer: Self-pay | Source: Home / Self Care | Attending: General Surgery

## 2023-02-02 ENCOUNTER — Inpatient Hospital Stay (HOSPITAL_COMMUNITY)
Admission: RE | Admit: 2023-02-02 | Discharge: 2023-02-05 | DRG: 331 | Disposition: A | Discharge status: Home / Self Care | Payer: Medicare Other | Attending: General Surgery | Admitting: General Surgery

## 2023-02-02 ENCOUNTER — Inpatient Hospital Stay (HOSPITAL_COMMUNITY): Payer: Medicare Other | Admitting: Physician Assistant

## 2023-02-02 DIAGNOSIS — Z823 Family history of stroke: Secondary | ICD-10-CM | POA: Diagnosis not present

## 2023-02-02 DIAGNOSIS — C189 Malignant neoplasm of colon, unspecified: Secondary | ICD-10-CM | POA: Diagnosis not present

## 2023-02-02 DIAGNOSIS — Z79899 Other long term (current) drug therapy: Secondary | ICD-10-CM

## 2023-02-02 DIAGNOSIS — E039 Hypothyroidism, unspecified: Secondary | ICD-10-CM | POA: Diagnosis present

## 2023-02-02 DIAGNOSIS — C182 Malignant neoplasm of ascending colon: Principal | ICD-10-CM | POA: Diagnosis present

## 2023-02-02 DIAGNOSIS — Z96651 Presence of right artificial knee joint: Secondary | ICD-10-CM | POA: Diagnosis present

## 2023-02-02 DIAGNOSIS — E278 Other specified disorders of adrenal gland: Secondary | ICD-10-CM | POA: Diagnosis not present

## 2023-02-02 DIAGNOSIS — D126 Benign neoplasm of colon, unspecified: Secondary | ICD-10-CM | POA: Diagnosis not present

## 2023-02-02 DIAGNOSIS — Z885 Allergy status to narcotic agent status: Secondary | ICD-10-CM | POA: Diagnosis not present

## 2023-02-02 DIAGNOSIS — Z8249 Family history of ischemic heart disease and other diseases of the circulatory system: Secondary | ICD-10-CM

## 2023-02-02 DIAGNOSIS — I10 Essential (primary) hypertension: Secondary | ICD-10-CM | POA: Diagnosis present

## 2023-02-02 DIAGNOSIS — Z981 Arthrodesis status: Secondary | ICD-10-CM | POA: Diagnosis not present

## 2023-02-02 DIAGNOSIS — R911 Solitary pulmonary nodule: Secondary | ICD-10-CM | POA: Diagnosis present

## 2023-02-02 DIAGNOSIS — E785 Hyperlipidemia, unspecified: Secondary | ICD-10-CM | POA: Diagnosis present

## 2023-02-02 DIAGNOSIS — Z79890 Hormone replacement therapy (postmenopausal): Secondary | ICD-10-CM | POA: Diagnosis not present

## 2023-02-02 DIAGNOSIS — Z96641 Presence of right artificial hip joint: Secondary | ICD-10-CM | POA: Diagnosis present

## 2023-02-02 DIAGNOSIS — Z8262 Family history of osteoporosis: Secondary | ICD-10-CM

## 2023-02-02 DIAGNOSIS — Z803 Family history of malignant neoplasm of breast: Secondary | ICD-10-CM

## 2023-02-02 DIAGNOSIS — Z888 Allergy status to other drugs, medicaments and biological substances status: Secondary | ICD-10-CM

## 2023-02-02 DIAGNOSIS — D509 Iron deficiency anemia, unspecified: Secondary | ICD-10-CM | POA: Diagnosis present

## 2023-02-02 DIAGNOSIS — Z91048 Other nonmedicinal substance allergy status: Secondary | ICD-10-CM | POA: Diagnosis not present

## 2023-02-02 DIAGNOSIS — Z01818 Encounter for other preprocedural examination: Principal | ICD-10-CM

## 2023-02-02 LAB — BPAM RBC
Blood Product Expiration Date: 202408302359
Blood Product Expiration Date: 202409022359
Unit Type and Rh: 6200
Unit Type and Rh: 6200

## 2023-02-02 LAB — TYPE AND SCREEN
ABO/RH(D): A POS
Antibody Screen: NEGATIVE
Unit division: 0
Unit division: 0

## 2023-02-02 SURGERY — COLECTOMY, PARTIAL, ROBOT-ASSISTED, LAPAROSCOPIC
Anesthesia: General | Site: Abdomen

## 2023-02-02 MED ORDER — LIDOCAINE 2% (20 MG/ML) 5 ML SYRINGE
INTRAMUSCULAR | Status: DC | PRN
  Administered 2023-02-02: 100 mg via INTRAVENOUS

## 2023-02-02 MED ORDER — ONDANSETRON HCL 4 MG PO TABS: 4.0000 mg | ORAL_TABLET | Freq: Four times a day (QID) | ORAL | Status: DC | PRN

## 2023-02-02 MED ORDER — ROCURONIUM BROMIDE 10 MG/ML (PF) SYRINGE
PREFILLED_SYRINGE | INTRAVENOUS | Status: DC | PRN
  Administered 2023-02-02: 20 mg via INTRAVENOUS
  Administered 2023-02-02: 50 mg via INTRAVENOUS
  Administered 2023-02-02: 20 mg via INTRAVENOUS

## 2023-02-02 MED ORDER — BUPROPION HCL ER (XL) 150 MG PO TB24
300.0000 mg | ORAL_TABLET | Freq: Every day | ORAL | Status: DC
  Administered 2023-02-03 – 2023-02-05 (×3): 300 mg via ORAL
  Filled 2023-02-02 (×3): qty 2

## 2023-02-02 MED ORDER — SACCHAROMYCES BOULARDII 250 MG PO CAPS
250.0000 mg | ORAL_CAPSULE | Freq: Two times a day (BID) | ORAL | Status: DC
  Administered 2023-02-02 – 2023-02-05 (×6): 250 mg via ORAL
  Filled 2023-02-02 (×6): qty 1

## 2023-02-02 MED ORDER — MECLIZINE HCL 25 MG PO TABS: 25.0000 mg | ORAL_TABLET | Freq: Three times a day (TID) | ORAL | Status: DC | PRN

## 2023-02-02 MED ORDER — HYDROMORPHONE HCL 1 MG/ML IJ SOLN: 0.5000 mg | INTRAMUSCULAR | Status: DC | PRN

## 2023-02-02 MED ORDER — DEXAMETHASONE SODIUM PHOSPHATE 10 MG/ML IJ SOLN
INTRAMUSCULAR | Status: DC | PRN
  Administered 2023-02-02: 10 mg via INTRAVENOUS

## 2023-02-02 MED ORDER — FENTANYL CITRATE (PF) 100 MCG/2ML IJ SOLN
INTRAMUSCULAR | Status: DC | PRN
  Administered 2023-02-02 (×4): 50 ug via INTRAVENOUS

## 2023-02-02 MED ORDER — LACTATED RINGERS IR SOLN
Status: DC | PRN
  Administered 2023-02-02: 1000 mL

## 2023-02-02 MED ORDER — ORAL CARE MOUTH RINSE: 15.0000 mL | Freq: Once | OROMUCOSAL | Status: AC

## 2023-02-02 MED ORDER — KCL IN DEXTROSE-NACL 20-5-0.45 MEQ/L-%-% IV SOLN
INTRAVENOUS | Status: DC
  Filled 2023-02-02 (×2): qty 1000

## 2023-02-02 MED ORDER — ENSURE PRE-SURGERY PO LIQD
592.0000 mL | Freq: Once | ORAL | Status: DC
  Filled 2023-02-02: qty 592

## 2023-02-02 MED ORDER — PROPOFOL 10 MG/ML IV BOLUS
INTRAVENOUS | Status: AC
  Filled 2023-02-02: qty 20

## 2023-02-02 MED ORDER — FENTANYL CITRATE PF 50 MCG/ML IJ SOSY: 25.0000 ug | PREFILLED_SYRINGE | INTRAMUSCULAR | Status: DC | PRN

## 2023-02-02 MED ORDER — 0.9 % SODIUM CHLORIDE (POUR BTL) OPTIME
TOPICAL | Status: DC | PRN
  Administered 2023-02-02: 1000 mL

## 2023-02-02 MED ORDER — SODIUM CHLORIDE (PF) 0.9 % IJ SOLN
INTRAMUSCULAR | Status: AC
  Filled 2023-02-02: qty 20

## 2023-02-02 MED ORDER — SUGAMMADEX SODIUM 200 MG/2ML IV SOLN
INTRAVENOUS | Status: DC | PRN
  Administered 2023-02-02: 200 mg via INTRAVENOUS

## 2023-02-02 MED ORDER — ONDANSETRON HCL 4 MG/2ML IJ SOLN: 4.0000 mg | Freq: Four times a day (QID) | INTRAMUSCULAR | Status: DC | PRN

## 2023-02-02 MED ORDER — ONDANSETRON HCL 4 MG/2ML IJ SOLN
INTRAMUSCULAR | Status: DC | PRN
  Administered 2023-02-02: 4 mg via INTRAVENOUS

## 2023-02-02 MED ORDER — ACETAMINOPHEN 500 MG PO TABS
1000.0000 mg | ORAL_TABLET | Freq: Four times a day (QID) | ORAL | Status: DC
  Administered 2023-02-02 – 2023-02-04 (×10): 1000 mg via ORAL
  Filled 2023-02-02 (×11): qty 2

## 2023-02-02 MED ORDER — DULOXETINE HCL 60 MG PO CPEP
60.0000 mg | ORAL_CAPSULE | Freq: Every day | ORAL | Status: DC
  Administered 2023-02-03 – 2023-02-05 (×3): 60 mg via ORAL
  Filled 2023-02-02 (×3): qty 1

## 2023-02-02 MED ORDER — BISACODYL 5 MG PO TBEC: 20.0000 mg | DELAYED_RELEASE_TABLET | Freq: Once | ORAL | Status: DC

## 2023-02-02 MED ORDER — BUPIVACAINE LIPOSOME 1.3 % IJ SUSP: 20.0000 mL | Freq: Once | INTRAMUSCULAR | Status: DC

## 2023-02-02 MED ORDER — METHOCARBAMOL 500 MG PO TABS
500.0000 mg | ORAL_TABLET | Freq: Three times a day (TID) | ORAL | Status: DC | PRN
  Administered 2023-02-03: 500 mg via ORAL
  Filled 2023-02-02: qty 1

## 2023-02-02 MED ORDER — POLYETHYLENE GLYCOL 3350 17 GM/SCOOP PO POWD
1.0000 | Freq: Once | ORAL | Status: DC
  Filled 2023-02-02: qty 255

## 2023-02-02 MED ORDER — FENTANYL CITRATE (PF) 100 MCG/2ML IJ SOLN
INTRAMUSCULAR | Status: AC
  Filled 2023-02-02: qty 2

## 2023-02-02 MED ORDER — SPY AGENT GREEN - (INDOCYANINE FOR INJECTION)
INTRAMUSCULAR | Status: DC | PRN
  Administered 2023-02-02: 2 mL via INTRAVENOUS

## 2023-02-02 MED ORDER — LACTATED RINGERS IV SOLN: INTRAVENOUS | Status: DC

## 2023-02-02 MED ORDER — ALVIMOPAN 12 MG PO CAPS
12.0000 mg | ORAL_CAPSULE | ORAL | Status: AC
  Administered 2023-02-02: 12 mg via ORAL
  Filled 2023-02-02: qty 1

## 2023-02-02 MED ORDER — ALVIMOPAN 12 MG PO CAPS
12.0000 mg | ORAL_CAPSULE | Freq: Two times a day (BID) | ORAL | Status: DC
  Administered 2023-02-03 – 2023-02-05 (×4): 12 mg via ORAL
  Filled 2023-02-02 (×5): qty 1

## 2023-02-02 MED ORDER — ACETAMINOPHEN 500 MG PO TABS
1000.0000 mg | ORAL_TABLET | ORAL | Status: AC
  Administered 2023-02-02: 1000 mg via ORAL
  Filled 2023-02-02: qty 2

## 2023-02-02 MED ORDER — ONDANSETRON HCL 4 MG/2ML IJ SOLN
INTRAMUSCULAR | Status: AC
  Filled 2023-02-02: qty 2

## 2023-02-02 MED ORDER — BUPIVACAINE-EPINEPHRINE (PF) 0.25% -1:200000 IJ SOLN
INTRAMUSCULAR | Status: DC | PRN
  Administered 2023-02-02: 50 mL

## 2023-02-02 MED ORDER — TRAZODONE HCL 50 MG PO TABS
50.0000 mg | ORAL_TABLET | Freq: Every evening | ORAL | Status: DC | PRN
  Administered 2023-02-02 – 2023-02-04 (×3): 100 mg via ORAL
  Filled 2023-02-02 (×3): qty 2

## 2023-02-02 MED ORDER — DEXAMETHASONE SODIUM PHOSPHATE 10 MG/ML IJ SOLN
INTRAMUSCULAR | Status: AC
  Filled 2023-02-02: qty 1

## 2023-02-02 MED ORDER — SIMETHICONE 80 MG PO CHEW
40.0000 mg | CHEWABLE_TABLET | Freq: Four times a day (QID) | ORAL | Status: DC | PRN
  Administered 2023-02-03 – 2023-02-04 (×3): 40 mg via ORAL
  Filled 2023-02-02 (×3): qty 1

## 2023-02-02 MED ORDER — ENOXAPARIN SODIUM 40 MG/0.4ML IJ SOSY
40.0000 mg | PREFILLED_SYRINGE | INTRAMUSCULAR | Status: DC
  Administered 2023-02-03 – 2023-02-05 (×3): 40 mg via SUBCUTANEOUS
  Filled 2023-02-02 (×3): qty 0.4

## 2023-02-02 MED ORDER — BUPIVACAINE LIPOSOME 1.3 % IJ SUSP
INTRAMUSCULAR | Status: AC
  Filled 2023-02-02: qty 20

## 2023-02-02 MED ORDER — LIDOCAINE HCL (PF) 2 % IJ SOLN
INTRAMUSCULAR | Status: AC
  Filled 2023-02-02: qty 5

## 2023-02-02 MED ORDER — BUPIVACAINE-EPINEPHRINE (PF) 0.5% -1:200000 IJ SOLN
INTRAMUSCULAR | Status: AC
  Filled 2023-02-02: qty 30

## 2023-02-02 MED ORDER — CHLORHEXIDINE GLUCONATE 0.12 % MT SOLN
15.0000 mL | Freq: Once | OROMUCOSAL | Status: AC
  Administered 2023-02-02: 15 mL via OROMUCOSAL

## 2023-02-02 MED ORDER — ROCURONIUM BROMIDE 10 MG/ML (PF) SYRINGE
PREFILLED_SYRINGE | INTRAVENOUS | Status: AC
  Filled 2023-02-02: qty 10

## 2023-02-02 MED ORDER — PHENYLEPHRINE 80 MCG/ML (10ML) SYRINGE FOR IV PUSH (FOR BLOOD PRESSURE SUPPORT)
PREFILLED_SYRINGE | INTRAVENOUS | Status: AC
  Filled 2023-02-02: qty 10

## 2023-02-02 MED ORDER — TRAMADOL HCL 50 MG PO TABS
50.0000 mg | ORAL_TABLET | Freq: Four times a day (QID) | ORAL | Status: DC | PRN
  Administered 2023-02-03: 50 mg via ORAL
  Administered 2023-02-03 – 2023-02-04 (×2): 100 mg via ORAL
  Administered 2023-02-05: 50 mg via ORAL
  Filled 2023-02-02 (×2): qty 1
  Filled 2023-02-02 (×2): qty 2

## 2023-02-02 MED ORDER — PROPOFOL 10 MG/ML IV BOLUS
INTRAVENOUS | Status: DC | PRN
  Administered 2023-02-02: 130 mg via INTRAVENOUS

## 2023-02-02 MED ORDER — ONDANSETRON HCL 4 MG/2ML IJ SOLN: 4.0000 mg | Freq: Once | INTRAMUSCULAR | Status: DC | PRN

## 2023-02-02 MED ORDER — DIPHENHYDRAMINE HCL 50 MG/ML IJ SOLN: 12.5000 mg | Freq: Four times a day (QID) | INTRAMUSCULAR | Status: DC | PRN

## 2023-02-02 MED ORDER — DIPHENHYDRAMINE HCL 12.5 MG/5ML PO ELIX: 12.5000 mg | ORAL_SOLUTION | Freq: Four times a day (QID) | ORAL | Status: DC | PRN

## 2023-02-02 MED ORDER — ENSURE PRE-SURGERY PO LIQD
296.0000 mL | Freq: Once | ORAL | Status: DC
  Filled 2023-02-02: qty 296

## 2023-02-02 MED ORDER — ENSURE SURGERY PO LIQD
237.0000 mL | Freq: Two times a day (BID) | ORAL | Status: DC
  Administered 2023-02-03 – 2023-02-05 (×5): 237 mL via ORAL

## 2023-02-02 MED ORDER — ALUM & MAG HYDROXIDE-SIMETH 200-200-20 MG/5ML PO SUSP: 30.0000 mL | Freq: Four times a day (QID) | ORAL | Status: DC | PRN

## 2023-02-02 MED ORDER — SODIUM CHLORIDE 0.9 % IV SOLN
2.0000 g | INTRAVENOUS | Status: AC
  Administered 2023-02-02: 2 g via INTRAVENOUS
  Filled 2023-02-02: qty 2

## 2023-02-02 SURGICAL SUPPLY — 91 items
BAG COUNTER SPONGE SURGICOUNT (BAG) ×1 IMPLANT
BAG SPNG CNTER NS LX DISP (BAG) ×1
BLADE EXTENDED COATED 6.5IN (ELECTRODE) IMPLANT
CANNULA REDUCER 12-8 DVNC XI (CANNULA) IMPLANT
CELLS DAT CNTRL 66122 CELL SVR (MISCELLANEOUS) IMPLANT
COVER SURGICAL LIGHT HANDLE (MISCELLANEOUS) ×2 IMPLANT
COVER TIP SHEARS 8 DVNC (MISCELLANEOUS) ×1 IMPLANT
DRAIN CHANNEL 19F RND (DRAIN) IMPLANT
DRAIN RELI 100 BL SUC LF ST (DRAIN)
DRAPE ARM DVNC X/XI (DISPOSABLE) ×4 IMPLANT
DRAPE COLUMN DVNC XI (DISPOSABLE) ×1 IMPLANT
DRAPE SURG IRRIG POUCH 19X23 (DRAPES) ×1 IMPLANT
DRIVER NDL LRG 8 DVNC XI (INSTRUMENTS) ×1 IMPLANT
DRIVER NDLE LRG 8 DVNC XI (INSTRUMENTS) IMPLANT
DRSG OPSITE POSTOP 4X10 (GAUZE/BANDAGES/DRESSINGS) IMPLANT
DRSG OPSITE POSTOP 4X6 (GAUZE/BANDAGES/DRESSINGS) IMPLANT
DRSG OPSITE POSTOP 4X8 (GAUZE/BANDAGES/DRESSINGS) IMPLANT
ELECT PENCIL ROCKER SW 15FT (MISCELLANEOUS) ×1 IMPLANT
ELECT REM PT RETURN 15FT ADLT (MISCELLANEOUS) ×1 IMPLANT
ENDOLOOP SUT PDS II 0 18 (SUTURE) IMPLANT
EVACUATOR SILICONE 100CC (DRAIN) IMPLANT
FORCEPS BPLR FENES DVNC XI (FORCEP) IMPLANT
GLOVE BIO SURGEON STRL SZ 6.5 (GLOVE) ×3 IMPLANT
GLOVE INDICATOR 6.5 STRL GRN (GLOVE) ×3 IMPLANT
GOWN SRG XL LVL 4 BRTHBL STRL (GOWNS) ×1 IMPLANT
GOWN STRL NON-REIN XL LVL4 (GOWNS) ×1
GOWN STRL REUS W/ TWL XL LVL3 (GOWN DISPOSABLE) ×3 IMPLANT
GOWN STRL REUS W/TWL XL LVL3 (GOWN DISPOSABLE) ×3
GRASPER SUT TROCAR 14GX15 (MISCELLANEOUS) IMPLANT
GRASPER TIP-UP FEN DVNC XI (INSTRUMENTS) ×1 IMPLANT
HOLDER FOLEY CATH W/STRAP (MISCELLANEOUS) ×1 IMPLANT
IRRIG SUCT STRYKERFLOW 2 WTIP (MISCELLANEOUS) ×1
IRRIGATION SUCT STRKRFLW 2 WTP (MISCELLANEOUS) ×1 IMPLANT
KIT PROCEDURE DVNC SI (MISCELLANEOUS) IMPLANT
KIT TURNOVER KIT A (KITS) IMPLANT
NDL INSUFFLATION 14GA 120MM (NEEDLE) ×1 IMPLANT
NEEDLE INSUFFLATION 14GA 120MM (NEEDLE) ×1 IMPLANT
PACK CARDIOVASCULAR III (CUSTOM PROCEDURE TRAY) ×1 IMPLANT
PACK COLON (CUSTOM PROCEDURE TRAY) ×1 IMPLANT
PAD POSITIONING PINK XL (MISCELLANEOUS) ×1 IMPLANT
RELOAD STAPLE 60 2.5 WHT DVNC (STAPLE) IMPLANT
RELOAD STAPLE 60 3.5 BLU DVNC (STAPLE) IMPLANT
RELOAD STAPLE 60 4.3 GRN DVNC (STAPLE) IMPLANT
RELOAD STAPLER 2.5X60 WHT DVNC (STAPLE) ×1 IMPLANT
RELOAD STAPLER 3.5X60 BLU DVNC (STAPLE) ×1 IMPLANT
RELOAD STAPLER 4.3X60 GRN DVNC (STAPLE) ×2 IMPLANT
RETRACTOR WND ALEXIS 18 MED (MISCELLANEOUS) IMPLANT
RTRCTR WOUND ALEXIS 18CM MED (MISCELLANEOUS)
SCISSORS LAP 5X35 DISP (ENDOMECHANICALS) IMPLANT
SCISSORS MNPLR CVD DVNC XI (INSTRUMENTS) ×1 IMPLANT
SEAL UNIV 5-12 XI (MISCELLANEOUS) ×3 IMPLANT
SEALER VESSEL EXT DVNC XI (MISCELLANEOUS) ×1 IMPLANT
SOL ELECTROSURG ANTI STICK (MISCELLANEOUS) ×1
SOLUTION ELECTROSURG ANTI STCK (MISCELLANEOUS) ×1 IMPLANT
SPIKE FLUID TRANSFER (MISCELLANEOUS) IMPLANT
STAPLER 60 SUREFORM DVNC (STAPLE) IMPLANT
STAPLER ECHELON POWER CIR 29 (STAPLE) IMPLANT
STAPLER ECHELON POWER CIR 31 (STAPLE) IMPLANT
STAPLER RELOAD 2.5X60 WHT DVNC (STAPLE) ×1
STAPLER RELOAD 3.5X60 BLU DVNC (STAPLE) ×1
STAPLER RELOAD 4.3X60 GRN DVNC (STAPLE) ×2
STOPCOCK 4 WAY LG BORE MALE ST (IV SETS) ×2 IMPLANT
SUT ETHILON 2 0 PS N (SUTURE) IMPLANT
SUT NOVA NAB GS-21 1 T12 (SUTURE) ×2 IMPLANT
SUT PROLENE 2 0 KS (SUTURE) IMPLANT
SUT SILK 2 0 (SUTURE) ×1
SUT SILK 2 0 SH CR/8 (SUTURE) IMPLANT
SUT SILK 2-0 18XBRD TIE 12 (SUTURE) ×1 IMPLANT
SUT SILK 3 0 (SUTURE) ×1
SUT SILK 3 0 SH CR/8 (SUTURE) ×1 IMPLANT
SUT SILK 3-0 18XBRD TIE 12 (SUTURE) IMPLANT
SUT V-LOC BARB 180 2/0GR6 GS22 (SUTURE) ×2
SUT VIC AB 2-0 SH 18 (SUTURE) IMPLANT
SUT VIC AB 2-0 SH 27 (SUTURE) ×1
SUT VIC AB 2-0 SH 27X BRD (SUTURE) IMPLANT
SUT VIC AB 3-0 SH 18 (SUTURE) IMPLANT
SUT VIC AB 4-0 PS2 27 (SUTURE) ×2 IMPLANT
SUT VICRYL 0 UR6 27IN ABS (SUTURE) ×1 IMPLANT
SUTURE V-LC BRB 180 2/0GR6GS22 (SUTURE) IMPLANT
SYR 20ML ECCENTRIC (SYRINGE) ×1 IMPLANT
SYS LAPSCP GELPORT 120MM (MISCELLANEOUS)
SYS WOUND ALEXIS 18CM MED (MISCELLANEOUS)
SYSTEM LAPSCP GELPORT 120MM (MISCELLANEOUS) IMPLANT
SYSTEM WOUND ALEXIS 18CM MED (MISCELLANEOUS) IMPLANT
TOWEL OR 17X26 10 PK STRL BLUE (TOWEL DISPOSABLE) IMPLANT
TOWEL OR NON WOVEN STRL DISP B (DISPOSABLE) ×1 IMPLANT
TRAY FOLEY MTR SLVR 14FR STAT (SET/KITS/TRAYS/PACK) IMPLANT
TRAY FOLEY MTR SLVR 16FR STAT (SET/KITS/TRAYS/PACK) ×1 IMPLANT
TROCAR ADV FIXATION 5X100MM (TROCAR) ×1 IMPLANT
TUBING CONNECTING 10 (TUBING) ×2 IMPLANT
TUBING INSUFFLATION 10FT LAP (TUBING) ×1 IMPLANT

## 2023-02-02 NOTE — Transfer of Care (Signed)
Immediate Anesthesia Transfer of Care Note  Patient: Brenda Solis  Procedure(s) Performed: XI ROBOT ASSISTED LAPAROSCOPIC PARTIAL COLECTOMY (Abdomen)  Patient Location: PACU  Anesthesia Type:General  Level of Consciousness: awake, alert , and oriented  Airway & Oxygen Therapy: Patient Spontanous Breathing and Patient connected to face mask oxygen  Post-op Assessment: Report given to RN and Post -op Vital signs reviewed and stable  Post vital signs: Reviewed and stable  Last Vitals:  Vitals Value Taken Time  BP 141/74   Temp    Pulse 95 02/02/23 1632  Resp 16   SpO2 98 % 02/02/23 1632  Vitals shown include unfiled device data.  Last Pain:  Vitals:   02/02/23 1242  TempSrc:   PainSc: 0-No pain      Patients Stated Pain Goal: 5 (02/02/23 1242)  Complications: No notable events documented.

## 2023-02-02 NOTE — Op Note (Signed)
02/02/2023  4:17 PM  PATIENT:  Brenda Solis  88 y.o. female  Patient Care Team: Raliegh Ip, DO as PCP - General (Family Medicine) Ollen Gross, MD as Consulting Physician (Orthopedic Surgery) Delora Fuel, OD (Optometry)  PRE-OPERATIVE DIAGNOSIS:  COLON CANCER  POST-OPERATIVE DIAGNOSIS:  COLON CANCER  PROCEDURE: XI ROBOT ASSISTED RIGHT COLECTOMY   Surgeon(s): Romie Levee, MD Andria Meuse, MD  ASSISTANT: Dr Cliffton Asters   ANESTHESIA:   local and general  EBL:5ml  Total I/O In: 1600 [I.V.:1500; IV Piggyback:100] Out: 200 [Urine:150; Blood:50]  Delay start of Pharmacological VTE agent (>24hrs) due to surgical blood loss or risk of bleeding:  no  DRAINS: none   SPECIMEN:  Source of Specimen:  R colon  DISPOSITION OF SPECIMEN:  PATHOLOGY  COUNTS:  YES  PLAN OF CARE: Admit to inpatient   PATIENT DISPOSITION:  PACU - hemodynamically stable.  INDICATION:    88 y.o. F with ascending colon cancer.  I recommended segmental resection:  The anatomy & physiology of the digestive tract was discussed.  The pathophysiology was discussed.  Natural history risks without surgery was discussed.   I worked to give an overview of the disease and the frequent need to have multispecialty involvement.  I feel the risks of no intervention will lead to serious problems that outweigh the operative risks; therefore, I recommended a partial colectomy to remove the pathology.  Laparoscopic & open techniques were discussed.   Risks such as bleeding, infection, abscess, leak, reoperation, possible ostomy, hernia, heart attack, death, and other risks were discussed.  I noted a good likelihood this will help address the problem.   Goals of post-operative recovery were discussed as well.    The patient expressed understanding & wished to proceed with surgery.  OR FINDINGS:   Patient had mass in her anterior cecum.  No obvious metastatic disease on visceral parietal peritoneum or  liver.  Nodular liver.  DESCRIPTION:   Informed consent was confirmed.  The patient underwent general anaesthesia without difficulty.  The patient was positioned appropriately.  VTE prevention in place.  The patient's abdomen was clipped, prepped, & draped in a sterile fashion.  Surgical timeout confirmed our plan.  The patient was positioned in reverse Trendelenburg.  Abdominal entry was gained using a Varies in the LUQ.  Entry was clean.  I induced carbon dioxide insufflation.  An 8mm robotic port was placed in the LLQ.  Camera inspection revealed no injury.  Extra ports were carefully placed under direct laparoscopic visualization.  I laparoscopically reflected the greater omentum and the upper abdomen the small bowel in the peilvis.  The omentum was adherent to the RLQ and the previous cholecystectomy scar. The patient was appropriately positioned and the robot was docked to the patient's right side.  Instruments were placed under direct visualization.   I began by taking down the omental adhesions and reflecting the omentum to the LUQ.  I elevated the cecum and identified the ileocolic artery and vein within the mesentery. Dissection was bluntly carried around these structures. The duodenum was identified and free from the structures. I then separated the structures bluntly and used the robotic vessel sealer device to transect these.  I developed the retroperitoneal plane bluntly.  I then freed the appendix off its attachments to the pelvic wall. I mobilized the terminal ileum.  I took care to avoid injuring any retroperitoneal structures.  After this I began to mobilize laterally down the white line of Toldt and then took  down the hepatic flexure using the Enseal device. I mobilized the omentum off of the right transverse colon. I divided the hepatocolic ligament carefully, making sure to pre severe the upper GI structures within the scar tissue.  The entire colon was then flipped medially and  mobilized off of the retroperitoneal structures until I could visualize the lateral edge of the duodenum underneath.  I gently freed the duodenal attachments.   I identified a portion of mesentery of the transverse colon just proximal to the right branch of the middle colic.  I injected firefly IV to confirm good blood supply to the remaining colon.  I divided up to the colon from the previous dissection of the mesentery using the robotic vessel sealer.  I then divided the terminal ileal mesentery in similar fashion.  At that point, the terminal ileum was divided with a blue load robotic 60 mm stapler.  The transverse colon was divided with a green load robotic stapler.  The specimen was then completely free and placed in the right upper quadrant.  Hemostasis was good. I then oriented the remaining terminal ileum and transverse colon and a isoperistaltic fashion.  I placed an enterotomy in the small bowel and colon using the robotic scissors.  I then introduced a white load 60 mm robotic stapler into both enterotomies and created an anastomosis between the small bowel and transverse colon.  Hemostasis within the staple line was good.  The common enterotomy channel was closed using 2 running 2-0 V-Loc sutures.  The abdomen was then irrigated with normal saline. The omentum was then brought down over the anastomosis.  At this point the robot was undocked.  The 12 mm suprapubic port was enlarged to a Pfannenstiel incision and an Alexis wound protector was placed.  The specimen was removed from the abdomen and evaluated.  Once the abdomen was inspected for hemostasis, the Alexis wound protector was removed.    The peritoneum of the Pfannenstiel incision was closed using a running 0 Vicryl suture.  The fascia was then closed using #1 Novafil interrupted sutures.  The subcutaneous tissue of the extraction incision was closed using a running 2-0 Vicryl suture. The skin was then closed using running subcuticular 4-0  Vicryl sutures.  A sterile dressing was applied.  The remaining port sites were closed using interrupted 4-0 Vicryl sutures and Dermabond. All counts were correct per operating room staff. The patient was then awakened from anesthesia and sent to the post anesthesia care unit in stable condition.    Vanita Panda, MD  Colorectal and General Surgery [facility] Surgery

## 2023-02-02 NOTE — Anesthesia Postprocedure Evaluation (Signed)
Anesthesia Post Note  Patient: Brenda Solis  Procedure(s) Performed: XI ROBOT ASSISTED LAPAROSCOPIC PARTIAL COLECTOMY (Abdomen)     Patient location during evaluation: PACU Anesthesia Type: General Level of consciousness: awake Pain management: pain level controlled Vital Signs Assessment: post-procedure vital signs reviewed and stable Respiratory status: spontaneous breathing Cardiovascular status: stable Postop Assessment: no apparent nausea or vomiting Anesthetic complications: yes  Encounter Notable Events  Notable Event Outcome Phase Comment  Difficult to intubate - unexpected  <72 hours postprocedure Filed from anesthesia note documentation.    Last Vitals:  Vitals:   02/02/23 1645 02/02/23 1700  BP: (!) 151/49 (!) 152/59  Pulse: 90 85  Resp: 19 14  Temp:    SpO2: 99% 99%    Last Pain:  Vitals:   02/02/23 1700  TempSrc:   PainSc: 3                  [employer]

## 2023-02-02 NOTE — Interval H&P Note (Signed)
History and Physical Interval Note:  02/02/2023 12:43 PM  Brenda Solis  has presented today for surgery, with the diagnosis of COLON CANCER.  The various methods of treatment have been discussed with the patient and family. After consideration of risks, benefits and other options for treatment, the patient has consented to  Procedure(s): XI ROBOT ASSISTED LAPAROSCOPIC PARTIAL COLECTOMY (N/A) as a surgical intervention.  The patient's history has been reviewed, patient examined, no change in status, stable for surgery.  I have reviewed the patient's chart and labs.  Questions were answered to the patient's satisfaction.     Vanita Panda, MD  Colorectal and General Surgery [facility] Surgery

## 2023-02-02 NOTE — Anesthesia Procedure Notes (Signed)
Procedure Name: Intubation Date/Time: 02/02/2023 1:47 PM  Performed by: Pearson Grippe, CRNAPre-anesthesia Checklist: Patient identified, Emergency Drugs available, Suction available and Patient being monitored Patient Re-evaluated:Patient Re-evaluated prior to induction Oxygen Delivery Method: Circle system utilized Preoxygenation: Pre-oxygenation with 100% oxygen Induction Type: IV induction Ventilation: Mask ventilation without difficulty Laryngoscope Size: Glidescope and 3 Tube type: Oral Tube size: 7.0 mm Number of attempts: 1 Airway Equipment and Method: Stylet, Oral airway and Video-laryngoscopy Placement Confirmation: ETT inserted through vocal cords under direct vision, positive ETCO2 and breath sounds checked- equal and bilateral Secured at: 21 cm Tube secured with: Tape Dental Injury: Teeth and Oropharynx as per pre-operative assessment  Difficulty Due To: Difficulty was unanticipated and Difficult Airway- due to anterior larynx Comments: Dlx1 Mil 2 by Merilynn Finland CRNA, grade III view with significant cricoid pressure, unsuccessful intubation attempt. Glidescope #3 x1, grade I view, ATOI.

## 2023-02-03 LAB — GLUCOSE, CAPILLARY
Glucose-Capillary: 111 mg/dL — ABNORMAL HIGH (ref 70–99)
Glucose-Capillary: 143 mg/dL — ABNORMAL HIGH (ref 70–99)
Glucose-Capillary: 103 mg/dL — ABNORMAL HIGH (ref 70–99)

## 2023-02-03 MED ORDER — INSULIN ASPART 100 UNIT/ML IJ SOLN
0.0000 [IU] | Freq: Three times a day (TID) | INTRAMUSCULAR | Status: DC
  Administered 2023-02-03: 3 [IU] via SUBCUTANEOUS

## 2023-02-03 MED ORDER — INSULIN ASPART 100 UNIT/ML IJ SOLN: 0.0000 [IU] | Freq: Every day | INTRAMUSCULAR | Status: DC

## 2023-02-03 NOTE — Progress Notes (Signed)
1 Day Post-Op Robotic R colectomy Subjective: Feeling well, no nausea, passing flatus  Objective: Vital signs in last 24 hours: Temp:  [97.6 F (36.4 C)-98.7 F (37.1 C)] 98 F (36.7 C) (08/08 0537) Pulse Rate:  [81-95] 81 (08/08 0537) Resp:  [14-23] 18 (08/08 0537) BP: (138-161)/(49-74) 138/49 (08/08 0537) SpO2:  [94 %-99 %] 99 % (08/08 0537) Weight:  [86.6 kg] 86.6 kg (08/07 1242)   Intake/Output from previous day: 08/07 0701 - 08/08 0700 In: 2414.1 [P.O.:420; I.V.:1894.1; IV Piggyback:100] Out: 2550 [Urine:2500; Blood:50] Intake/Output this shift: Total I/O In: 120 [P.O.:120] Out: 200 [Urine:200]   General appearance: alert and cooperative GI: soft, non-distended  Incision: no significant drainage  Lab Results:  Recent Labs    02/03/23 0447  WBC 12.7*  HGB 10.0*  HCT 32.5*  PLT 182   BMET Recent Labs    02/03/23 0447  NA 134*  K 4.7  CL 108  CO2 18*  GLUCOSE 276*  BUN 6*  CREATININE 0.94  CALCIUM 8.8*   PT/INR No results for input(s): "LABPROT", "INR" in the last 72 hours. ABG No results for input(s): "PHART", "HCO3" in the last 72 hours.  Invalid input(s): "PCO2", "PO2"  MEDS, Scheduled  acetaminophen  1,000 mg Oral Q6H   alvimopan  12 mg Oral BID   buPROPion  300 mg Oral Daily   DULoxetine  60 mg Oral Daily   enoxaparin (LOVENOX) injection  40 mg Subcutaneous Q24H   feeding supplement  237 mL Oral BID BM   insulin aspart  0-20 Units Subcutaneous TID WC   insulin aspart  0-5 Units Subcutaneous QHS   saccharomyces boulardii  250 mg Oral BID    Studies/Results: No results found.  Assessment: s/p Procedure(s): XI ROBOT ASSISTED LAPAROSCOPIC PARTIAL COLECTOMY Patient Active Problem List   Diagnosis Date Noted   Colon cancer (HCC) 02/02/2023   Primary osteoarthritis of right knee 08/24/2021   Cough 07/06/2021   Osteoarthritis of right knee 05/18/2021   Chronic pain 06/25/2020   Obsessional thoughts    Mood disorder (HCC)     Osteopenia 04/24/2014   Hyperlipidemia with target LDL less than 100 02/01/2014   Depression 02/01/2014   Spinal stenosis, lumbar region, with neurogenic claudication 02/01/2013   Hypertension 11/17/2010   Hypothyroidism 10/14/2010   Insomnia 10/14/2010   Gastritis 10/14/2010    Expected post op course  Plan: d/c foley Advance diet Ambulate in hall SL IVFs   LOS: 1 day     .Vanita Panda, MD [facility] Surgery, [place]    02/03/2023 8:14 AM

## 2023-02-03 NOTE — Progress Notes (Signed)
   02/03/23 1019  TOC Brief Assessment  Insurance and Status Reviewed  Patient has primary care physician Yes  Home environment has been reviewed Resides with spouse and children  Prior level of function: Independent at baseline  Prior/Current Home Services No current home services  Social Determinants of Health Reivew SDOH reviewed no interventions necessary  Readmission risk has been reviewed Yes  Transition of care needs no transition of care needs at this time

## 2023-02-03 NOTE — Plan of Care (Signed)
  Problem: Activity: Goal: Ability to tolerate increased activity will improve Outcome: Progressing   Problem: Respiratory: Goal: Respiratory status will improve Outcome: Progressing   Problem: Activity: Goal: Risk for activity intolerance will decrease Outcome: Progressing

## 2023-02-04 LAB — GLUCOSE, CAPILLARY: Glucose-Capillary: 84 mg/dL (ref 70–99)

## 2023-02-04 NOTE — Progress Notes (Signed)
Mobility Specialist - Progress Note   02/04/23 0959  Mobility  Activity Ambulated with assistance in hallway  Level of Assistance Standby assist, set-up cues, supervision of patient - no hands on  Assistive Device Front wheel walker  Distance Ambulated (ft) 120 ft  Activity Response Tolerated well  Mobility Referral Yes  $Mobility charge 1 Mobility  Mobility Specialist Start Time (ACUTE ONLY) [id number]  Mobility Specialist Stop Time (ACUTE ONLY) 0959  Mobility Specialist Time Calculation (min) (ACUTE ONLY) 8 min   Pt received in recliner and agreeable to mobility. No complaints during session. Pt to recliner after session with all needs met.    [facility]

## 2023-02-04 NOTE — Progress Notes (Signed)
2 Days Post-Op Robotic R colectomy Subjective: Feeling well, no nausea, passing flatus. No BM yet  Objective: Vital signs in last 24 hours: Temp:  [97.7 F (36.5 C)-98.8 F (37.1 C)] 97.8 F (36.6 C) (08/09 0845) Pulse Rate:  [70-87] 72 (08/09 0845) Resp:  [16-18] 16 (08/09 0845) BP: (125-141)/(43-55) 125/53 (08/09 0845) SpO2:  [95 %-100 %] 96 % (08/09 0845)   Intake/Output from previous day: 08/08 0701 - 08/09 0700 In: 1560 [P.O.:1560] Out: 1550 [Urine:1550] Intake/Output this shift: Total I/O In: 120 [P.O.:120] Out: 400 [Urine:400]   General appearance: alert and cooperative GI: soft, mildly distended  Incision: no significant drainage  Lab Results:  Recent Labs    02/03/23 0447 02/04/23 0438  WBC 12.7* 8.9  HGB 10.0* 9.3*  HCT 32.5* 29.6*  PLT 182 173   BMET Recent Labs    02/03/23 0447 02/04/23 0438  NA 134* 136  K 4.7 3.9  CL 108 106  CO2 18* 23  GLUCOSE 276* 104*  BUN 6* 15  CREATININE 0.94 0.93  CALCIUM 8.8* 9.1   PT/INR No results for input(s): "LABPROT", "INR" in the last 72 hours. ABG No results for input(s): "PHART", "HCO3" in the last 72 hours.  Invalid input(s): "PCO2", "PO2"  MEDS, Scheduled  acetaminophen  1,000 mg Oral Q6H   alvimopan  12 mg Oral BID   buPROPion  300 mg Oral Daily   DULoxetine  60 mg Oral Daily   enoxaparin (LOVENOX) injection  40 mg Subcutaneous Q24H   feeding supplement  237 mL Oral BID BM   saccharomyces boulardii  250 mg Oral BID    Studies/Results: No results found.  Assessment: s/p Procedure(s): XI ROBOT ASSISTED LAPAROSCOPIC PARTIAL COLECTOMY Patient Active Problem List   Diagnosis Date Noted   Colon cancer (HCC) 02/02/2023   Primary osteoarthritis of right knee 08/24/2021   Cough 07/06/2021   Osteoarthritis of right knee 05/18/2021   Chronic pain 06/25/2020   Obsessional thoughts    Mood disorder (HCC)    Osteopenia 04/24/2014   Hyperlipidemia with target LDL less than 100 02/01/2014    Depression 02/01/2014   Spinal stenosis, lumbar region, with neurogenic claudication 02/01/2013   Hypertension 11/17/2010   Hypothyroidism 10/14/2010   Insomnia 10/14/2010   Gastritis 10/14/2010    Expected post op course  Plan: Cont diet Ambulate in hall SL IVFs   LOS: 2 days     .Vanita Panda, MD [facility] Surgery, [place]    02/04/2023 10:34 AM

## 2023-02-04 NOTE — Plan of Care (Signed)
Problem: Activity: Goal: Ability to tolerate increased activity will improve Outcome: Completed/Met   

## 2023-02-04 NOTE — Discharge Instructions (Signed)
SURGERY: POST OP INSTRUCTIONS (Surgery for small bowel obstruction, colon resection, etc)   ######################################################################  EAT Gradually transition to a high fiber diet with a fiber supplement over the next few days after discharge  WALK Walk an hour a day.  Control your pain to do that.    CONTROL PAIN Control pain so that you can walk, sleep, tolerate sneezing/coughing, go up/down stairs.  HAVE A BOWEL MOVEMENT DAILY Keep your bowels regular to avoid problems.  OK to try a laxative to override constipation.  OK to use an antidairrheal to slow down diarrhea.  Call if not better after 2 tries  CALL IF YOU HAVE PROBLEMS/CONCERNS Call if you are still struggling despite following these instructions. Call if you have concerns not answered by these instructions  ######################################################################   DIET Follow a light diet the first few days at home.  Start with a bland diet such as soups, liquids, starchy foods, low fat foods, etc.  If you feel full, bloated, or constipated, stay on a ful liquid or pureed/blenderized diet for a few days until you feel better and no longer constipated. Be sure to drink plenty of fluids every day to avoid getting dehydrated (feeling dizzy, not urinating, etc.). Gradually add a fiber supplement to your diet over the next week.  Gradually get back to a regular solid diet.  Avoid fast food or heavy meals the first week as you are more likely to get nauseated. It is expected for your digestive tract to need a few months to get back to normal.  It is common for your bowel movements and stools to be irregular.  You will have occasional bloating and cramping that should eventually fade away.  Until you are eating solid food normally, off all pain medications, and back to regular activities; your bowels will not be normal. Focus on eating a low-fat, high fiber diet the rest of your life  (See Getting to Good Bowel Health, below).  CARE of your INCISION or WOUND  It is good for closed incisions and even open wounds to be washed every day.  Shower every day.  Short baths are fine.  Wash the incisions and wounds clean with soap & water.    You may leave closed incisions open to air if it is dry.   You may cover the incision with clean gauze & replace it after your daily shower for comfort.  STAPLES: You have skin staples.  Leave them in place & set up an appointment for them to be removed by a surgery office nurse ~10 days after surgery. = 1st week of January 2024    ACTIVITIES as tolerated Start light daily activities --- self-care, walking, climbing stairs-- beginning the day after surgery.  Gradually increase activities as tolerated.  Control your pain to be active.  Stop when you are tired.  Ideally, walk several times a day, eventually an hour a day.   Most people are back to most day-to-day activities in a few weeks.  It takes 4-8 weeks to get back to unrestricted, intense activity. If you can walk 30 minutes without difficulty, it is safe to try more intense activity such as jogging, treadmill, bicycling, low-impact aerobics, swimming, etc. Save the most intensive and strenuous activity for last (Usually 4-8 weeks after surgery) such as sit-ups, heavy lifting, contact sports, etc.  Refrain from any intense heavy lifting or straining until you are off narcotics for pain control.  You will have off days, but things should improve   week-by-week. DO NOT PUSH THROUGH PAIN.  Let pain be your guide: If it hurts to do something, don't do it.  Pain is your body warning you to avoid that activity for another week until the pain goes down. You may drive when you are no longer taking narcotic prescription pain medication, you can comfortably wear a seatbelt, and you can safely make sudden turns/stops to protect yourself without hesitating due to pain. You may have sexual intercourse when it  is comfortable. If it hurts to do something, stop.  MEDICATIONS Take your usually prescribed home medications unless otherwise directed.   Blood thinners:  Usually you can restart any strong blood thinners after the second postoperative day.  It is OK to take aspirin right away.     If you are on strong blood thinners (warfarin/Coumadin, Plavix, Xerelto, Eliquis, Pradaxa, etc), discuss with your surgeon, medicine PCP, and/or cardiologist for instructions on when to restart the blood thinner & if blood monitoring is needed (PT/INR blood check, etc).     PAIN CONTROL Pain after surgery or related to activity is often due to strain/injury to muscle, tendon, nerves and/or incisions.  This pain is usually short-term and will improve in a few months.  To help speed the process of healing and to get back to regular activity more quickly, DO THE FOLLOWING THINGS TOGETHER: Increase activity gradually.  DO NOT PUSH THROUGH PAIN Use Ice and/or Heat Try Gentle Massage and/or Stretching Take over the counter pain medication Take Narcotic prescription pain medication for more severe pain  Good pain control = faster recovery.  It is better to take more medicine to be more active than to stay in bed all day to avoid medications.  Increase activity gradually Avoid heavy lifting at first, then increase to lifting as tolerated over the next 6 weeks. Do not "push through" the pain.  Listen to your body and avoid positions and maneuvers than reproduce the pain.  Wait a few days before trying something more intense Walking an hour a day is encouraged to help your body recover faster and more safely.  Start slowly and stop when getting sore.  If you can walk 30 minutes without stopping or pain, you can try more intense activity (running, jogging, aerobics, cycling, swimming, treadmill, sex, sports, weightlifting, etc.) Remember: If it hurts to do it, then don't do it! Use Ice and/or Heat You will have swelling and  bruising around the incisions.  This will take several weeks to resolve. Ice packs or heating pads (6-8 times a day, 30-60 minutes at a time) will help sooth soreness & bruising. Some people prefer to use ice alone, heat alone, or alternate between ice & heat.  Experiment and see what works best for you.  Consider trying ice for the first few days to help decrease swelling and bruising; then, switch to heat to help relax sore spots and speed recovery. Shower every day.  Short baths are fine.  It feels good!  Keep the incisions and wounds clean with soap & water.   Try Gentle Massage and/or Stretching Massage at the area of pain many times a day Stop if you feel pain - do not overdo it Take over the counter pain medication This helps the muscle and nerve tissues become less irritable and calm down faster Choose ONE of the following over-the-counter anti-inflammatory medications: Acetaminophen 500mg tabs (Tylenol) 1-2 pills with every meal and just before bedtime (avoid if you have liver problems or if you have   acetaminophen in you narcotic prescription) Naproxen 220mg tabs (ex. Aleve, Naprosyn) 1-2 pills twice a day (avoid if you have kidney, stomach, IBD, or bleeding problems) Ibuprofen 200mg tabs (ex. Advil, Motrin) 3-4 pills with every meal and just before bedtime (avoid if you have kidney, stomach, IBD, or bleeding problems) Take with food/snack several times a day as directed for at least 2 weeks to help keep pain / soreness down & more manageable. Take Narcotic prescription pain medication for more severe pain A prescription for strong pain control is often given to you upon discharge (for example: oxycodone/Percocet, hydrocodone/Norco/Vicodin, or tramadol/Ultram) Take your pain medication as prescribed. Be mindful that most narcotic prescriptions contain Tylenol (acetaminophen) as well - avoid taking too much Tylenol. If you are having problems/concerns with the prescription medicine (does  not control pain, nausea, vomiting, rash, itching, etc.), please call us [phone] to see if we need to switch you to a different pain medicine that will work better for you and/or control your side effects better. If you need a refill on your pain medication, you must call the office before 4 pm and on weekdays only.  By federal law, prescriptions for narcotics cannot be called into a pharmacy.  They must be filled out on paper & picked up from our office by the patient or authorized caretaker.  Prescriptions cannot be filled after 4 pm nor on weekends.    WHEN TO CALL US [phone] Severe uncontrolled or worsening pain  Fever over 101 F (38.5 C) Concerns with the incision: Worsening pain, redness, rash/hives, swelling, bleeding, or drainage Reactions / problems with new medications (itching, rash, hives, nausea, etc.) Nausea and/or vomiting Difficulty urinating Difficulty breathing Worsening fatigue, dizziness, lightheadedness, blurred vision Other concerns If you are not getting better after two weeks or are noticing you are getting worse, contact our office [phone] for further advice.  We may need to adjust your medications, re-evaluate you in the office, send you to the emergency room, or see what other things we can do to help. The clinic staff is available to answer your questions during regular business hours (8:30am-5pm).  Please don't hesitate to call and ask to speak to one of our nurses for clinical concerns.    A surgeon from [facility] is always on call at the hospitals 24 hours/day If you have a medical emergency, go to the nearest emergency room or call 911.  FOLLOW UP in our office One the day of your discharge from the hospital (or the next business weekday), please call [facility] to set up or confirm an appointment to see your surgeon in the office for a follow-up appointment.  Usually it is 2-3 weeks after your surgery.   If you  have skin staples at your incision(s), let the office know so we can set up a time in the office for the nurse to remove them (usually around 10 days after surgery). Make sure that you call for appointments the day of discharge (or the next business weekday) from the hospital to ensure a convenient appointment time. IF YOU HAVE DISABILITY OR FAMILY LEAVE FORMS, BRING THEM TO THE OFFICE FOR PROCESSING.  DO NOT GIVE THEM TO YOUR DOCTOR.  [facility] [address], [address], [place], [place]  [address] ? [phone] - Main [phone] - Toll Free,  [phone] - Fax [email or web address]    GETTING TO GOOD BOWEL HEALTH. It is expected for your digestive tract to   need a few months to get back to normal.  It is common for your bowel movements and stools to be irregular.  You will have occasional bloating and cramping that should eventually fade away.  Until you are eating solid food normally, off all pain medications, and back to regular activities; your bowels will not be normal.   Avoiding constipation The goal: ONE SOFT BOWEL MOVEMENT A DAY!    Drink plenty of fluids.  Choose water first. TAKE A FIBER SUPPLEMENT EVERY DAY THE REST OF YOUR LIFE During your first week back home, gradually add back a fiber supplement every day Experiment which form you can tolerate.   There are many forms such as powders, tablets, wafers, gummies, etc Psyllium bran (Metamucil), methylcellulose (Citrucel), Miralax or Glycolax, Benefiber, Flax Seed.  Adjust the dose week-by-week (1/2 dose/day to 6 doses a day) until you are moving your bowels 1-2 times a day.  Cut back the dose or try a different fiber product if it is giving you problems such as diarrhea or bloating. Sometimes a laxative is needed to help jump-start bowels if constipated until the fiber supplement can help regulate your bowels.  If you are tolerating eating & you are farting, it is okay to try a gentle  laxative such as double dose MiraLax, prune juice, or Milk of Magnesia.  Avoid using laxatives too often. Stool softeners can sometimes help counteract the constipating effects of narcotic pain medicines.  It can also cause diarrhea, so avoid using for too long. If you are still constipated despite taking fiber daily, eating solids, and a few doses of laxatives, call our office. Controlling diarrhea Try drinking liquids and eating bland foods for a few days to avoid stressing your intestines further. Avoid dairy products (especially milk & ice cream) for a short time.  The intestines often can lose the ability to digest lactose when stressed. Avoid foods that cause gassiness or bloating.  Typical foods include beans and other legumes, cabbage, broccoli, and dairy foods.  Avoid greasy, spicy, fast foods.  Every person has some sensitivity to other foods, so listen to your body and avoid those foods that trigger problems for you. Probiotics (such as active yogurt, Align, etc) may help repopulate the intestines and colon with normal bacteria and calm down a sensitive digestive tract Adding a fiber supplement gradually can help thicken stools by absorbing excess fluid and retrain the intestines to act more normally.  Slowly increase the dose over a few weeks.  Too much fiber too soon can backfire and cause cramping & bloating. It is okay to try and slow down diarrhea with a few doses of antidiarrheal medicines.   Bismuth subsalicylate (ex. Kayopectate, Pepto Bismol) for a few doses can help control diarrhea.  Avoid if pregnant.   Loperamide (Imodium) can slow down diarrhea.  Start with one tablet (2mg) first.  Avoid if you are having fevers or severe pain.  ILEOSTOMY PATIENTS WILL HAVE CHRONIC DIARRHEA since their colon is not in use.    Drink plenty of liquids.  You will need to drink even more glasses of water/liquid a day to avoid getting dehydrated. Record output from your ileostomy.  Expect to empty  the bag every 3-4 hours at first.  Most people with a permanent ileostomy empty their bag 4-6 times at the least.   Use antidiarrheal medicine (especially Imodium) several times a day to avoid getting dehydrated.  Start with a dose at bedtime & breakfast.  Adjust up or   down as needed.  Increase antidiarrheal medications as directed to avoid emptying the bag more than 8 times a day (every 3 hours). Work with your wound ostomy nurse to learn care for your ostomy.  See ostomy care instructions. TROUBLESHOOTING IRREGULAR BOWELS 1) Start with a soft & bland diet. No spicy, greasy, or fried foods.  2) Avoid gluten/wheat or dairy products from diet to see if symptoms improve. 3) Miralax 17gm or flax seed mixed in 8oz. water or juice-daily. May use 2-4 times a day as needed. 4) Gas-X, Phazyme, etc. as needed for gas & bloating.  5) Prilosec (omeprazole) over-the-counter as needed 6)  Consider probiotics (Align, Activa, etc) to help calm the bowels down  Call your doctor if you are getting worse or not getting better.  Sometimes further testing (cultures, endoscopy, X-ray studies, CT scans, bloodwork, etc.) may be needed to help diagnose and treat the cause of the diarrhea. [facility] [address], [address], [place], [place]  [address] [phone] - Main.    [phone]  - Toll Free.   [phone] - Fax [email or web address]   ###############################   #######################################################  Ostomy Support Information  You've heard that people get along just fine with only one of their eyes, or one of their lungs, or one of their kidneys. But you also know that you have only one intestine and only one bladder, and that leaves you feeling awfully empty, both physically and emotionally: You think no other people go around without part of their intestine with the ends of their intestines sticking out through their abdominal walls.    YOU ARE NOT ALONE.  There are nearly three quarters of a million people in the [place] who have an ostomy; people who have had surgery to remove all or part of their colons or bladders.   There is even a national association, the [employer] with over 350 local affiliated support groups that are organized by volunteers who provide peer support and counseling. [employer] has a toll free telephone num-ber, 800-826-0826 and an educational, interactive website, www.ostomy.org   An ostomy is an opening in the belly (abdominal wall) made by surgery. Ostomates are people who have had this procedure. The opening (stoma) allows the kidney or bowel to grdischarge waste. An external pouch covers the stoma to collect waste. Pouches are are a simple bag and are odor free. Different companies have disposable or reusable pouches to fit one's lifestyle. An ostomy can either be temporary or permanent.   THERE ARE THREE MAIN TYPES OF OSTOMIES Colostomy. A colostomy is a surgically created opening in the large intestine (colon). Ileostomy. An ileostomy is a surgically created opening in the small intestine. Urostomy. A urostomy is a surgically created opening to divert urine away from the bladder.  OSTOMY Care  The following guidelines will make care of your colostomy easier. Keep this information close by for quick reference.  Helpful DIET hints Eat a well-balanced diet including vegetables and fresh fruits. Eat on a regular schedule.  Drink at least 6 to 8 glasses of fluids daily. Eat slowly in a relaxed atmosphere. Chew your food thoroughly. Avoid chewing gum, smoking, and drinking from a straw. This will help decrease the amount of air you swallow, which may help reduce gas. Eating yogurt or drinking buttermilk may help reduce gas.  To control gas at night, do not eat after 8 p.m. This will give your bowel time to quiet down before you go   to bed.  If gas is a problem, you can purchase  Beano. Sprinkle Beano on the first bite of food before eating to reduce gas. It has no flavor and should not change the taste of your food. You can buy Beano over the counter at your local drugstore.  Foods like fish, onions, garlic, broccoli, asparagus, and cabbage produce odor. Although your pouch is odor-proof, if you eat these foods you may notice a stronger odor when emptying your pouch. If this is a concern, you may want to limit these foods in your diet.  If you have an ileostomy, you will have chronic diarrhea & need to drink more liquids to avoid getting dehydrated.  Consider antidiarrheal medicine like imodium (loperamide) or Lomotil to help slow down bowel movements / diarrhea into your ileostomy bag.  GETTING TO GOOD BOWEL HEALTH WITH AN ILEOSTOMY    With the colon bypassed & not in use, you will have small bowel diarrhea.   It is important to thicken & slow your bowel movements down.   The goal: 4-6 small BOWEL MOVEMENTS A DAY It is important to drink plenty of liquids to avoid getting dehydrated  CONTROLLING ILEOSTOMY DIARRHEA  TAKE A FIBER SUPPLEMENT (FiberCon or Benefiner soluble fiber) twice a day - to thicken stools by absorbing excess fluid and retrain the intestines to act more normally.  Slowly increase the dose over a few weeks.  Too much fiber too soon can backfire and cause cramping & bloating.  TAKE AN IRON SUPPLEMENT twice a day to naturally constipate your bowels.  Usually ferrous sulfate 325mg twice a day)  TAKE ANTI-DIARRHEAL MEDICINES: Loperamide (Imodium) can slow down diarrhea.  Start with two tablets (= 4mg) first and then try one tablet every 6 hours.  Can go up to 2 pills four times day (8 pills of 2mg max) Avoid if you are having fevers or severe pain.  If you are not better or start feeling worse, stop all medicines and call your doctor for advice LoMotil (Diphenoxylate / Atropine) is another medicine that can constipate & slow down bowel moevements Pepto  Bismol (bismuth) can gently thicken bowels as well  If diarrhea is worse,: drink plenty of liquids and try simpler foods for a few days to avoid stressing your intestines further. Avoid dairy products (especially milk & ice cream) for a short time.  The intestines often can lose the ability to digest lactose when stressed. Avoid foods that cause gassiness or bloating.  Typical foods include beans and other legumes, cabbage, broccoli, and dairy foods.  Every person has some sensitivity to other foods, so listen to our body and avoid those foods that trigger problems for you.Call your doctor if you are getting worse or not better.  Sometimes further testing (cultures, endoscopy, X-ray studies, bloodwork, etc) may be needed to help diagnose and treat the cause of the diarrhea. Take extra anti-diarrheal medicines (maximum is 8 pills of 2mg loperamide a day)   Tips for POUCHING an OSTOMY   Changing Your Pouch The best time to change your pouch is in the morning, before eating or drinking anything. Your stoma can function at any time, but it will function more after eating or drinking.   Applying the pouching system  Place all your equipment close at hand before removing your pouch.  Wash your hands.  Stand or sit in front of a mirror. Use the position that works best for you. Remember that you must keep the skin around the stoma   wrinkle-free for a good seal.  Gently remove the used pouch (1-piece system) or the pouch and old wafer (2-piece system). Empty the pouch into the toilet. Save the closure clip to use again.  Wash the stoma itself and the skin around the stoma. Your stoma may bleed a little when being washed. This is normal. Rinse and pat dry. You may use a wash cloth or soft paper towels (like Bounty), mild soap (like Dial, Safeguard, or Ivory), and water. Avoid soaps that contain perfumes or lotions.  For a new pouch (1-piece system) or a new wafer (2-piece system), measure your  stoma using the stoma guide in each box of supplies.  Trace the shape of your stoma onto the back of the new pouch or the back of the new wafer. Cut out the opening. Remove the paper backing and set it aside.  Optional: Apply a skin barrier powder to surrounding skin if it is irritated (bare or weeping), and dust off the excess. Optional: Apply a skin-prep wipe (such as Skin Prep or All-Kare) to the skin around the stoma, and let it dry. Do not apply this solution if the skin is irritated (red, tender, or broken) or if you have shaved around the stoma. Optional: Apply a skin barrier paste (such as Stomahesive, Coloplast, or Premium) around the opening cut in the back of the pouch or wafer. Allow it to dry for 30 to 60 seconds.  Hold the pouch (1-piece system) or wafer (2-piece system) with the sticky side toward your body. Make sure the skin around the stoma is wrinkle-free. Center the opening on the stoma, then press firmly to your abdomen (Fig. 4). Look in the mirror to check if you are placing the pouch, or wafer, in the right position. For a 2-piece system, snap the pouch onto the wafer. Make sure it snaps into place securely.  Place your hand over the stoma and the pouch or wafer for about 30 seconds. The heat from your hand can help the pouch or wafer stick to your skin.  Add deodorant (such as Super Banish or Nullo) to your pouch. Other options include food extracts such as vanilla oil and peppermint extract. Add about 10 drops of the deodorant to the pouch. Then apply the closure clamp. Note: Do not use toxic  chemicals or commercial cleaning agents in your pouch. These substances may harm the stoma.  Optional: For extra seal, apply tape to all 4 sides around the pouch or wafer, as if you were framing a picture. You may use any brand of medical adhesive tape. Change your pouch every 5 to 7 days. Change it immediately if a leak occurs.  Wash your hands afterwards.  If you are wearing a  2-piece system, you may use 2 new pouches per week and alternate them. Rinse the pouch with mild soap and warm water and hang it to dry for the next day. Apply the fresh pouch. Alternate the 2 pouches like this for a week. After a week, change the wafer and begin with 2 new pouches. Place the old pouches in a plastic bag, and put them in the trash.   LIVING WITH AN OSTOMY  Emptying Your Pouch Empty your pouch when it is one-third full (of urine, stool, and/or gas). If you wait until your pouch is fuller than this, it will be more difficult to empty and more noticeable. When you empty your pouch, either put toilet paper in the toilet bowl first, or flush the   toilet while you empty the pouch. This will reduce splashing. You can empty the pouch between your legs or to one side while sitting, or while standing or stooping. If you have a 2-piece system, you can snap off the pouch to empty it. Remember that your stoma may function during this time. If you wish to rinse your pouch after you empty it, a turkey baster can be helpful. When using a baster, squirt water up into the pouch through the opening at the bottom. With a 2-piece system, you can snap off the pouch to rinse it. After rinsing  your pouch, empty it into the toilet. When rinsing your pouch at home, put a few granules of Dreft soap in the rinse water. This helps lubricate and freshen your pouch. The inside of your pouch can be sprayed with non-stick cooking oil (Pam spray). This may help reduce stool sticking to the inside of the pouch.  Bathing You may shower or bathe with your pouch on or off. Remember that your stoma may function during this time.  The materials you use to wash your stoma and the skin around it should be clean, but they do not need to be sterile.  Wearing Your Pouch During hot weather, or if you perspire a lot in general, wear a cover over your pouch. This may prevent a rash on your skin under the pouch. Pouch covers are  sold at ostomy supply stores. Wear the pouch inside your underwear for better support. Watch your weight. Any gain or loss of 10 to 15 pounds or more can change the way your pouch fits.  Going Away From Home A collapsible cup (like those that come in travel kits) or a soft plastic squirt bottle with a pull-up top (like a travel bottle for shampoo) can be used for rinsing your pouch when you are away from home. Tilt the opening of the pouch at an upward angle when using a cup to rinse.  Carry wet wipes or extra tissues to use in public bathrooms.  Carry an extra pouching system with you at all times.  Never keep ostomy supplies in the glove compartment of your car. Extreme heat or cold can damage the skin barriers and adhesive wafers on the pouch.  When you travel, carry your ostomy supplies with you at all times. Keep them within easy reach. Do not pack ostomy supplies in baggage that will be checked or otherwise separated from you, because your baggage might be lost. If you're traveling out of the country, it is helpful to have a letter stating that you are carrying ostomy supplies as a medical necessity.  If you need ostomy supplies while traveling, look in the yellow pages of the telephone book under "Surgical Supplies." Or call the local ostomy organization to find out where supplies are available.  Do not let your ostomy supplies get low. Always order new pouches before you use the last one.  Reducing Odor Limit foods such as broccoli, cabbage, onions, fish, and garlic in your diet to help reduce odor. Each time you empty your pouch, carefully clean the opening of the pouch, both inside and outside, with toilet paper. Rinse your pouch 1 or 2 times daily after you empty it (see directions for emptying your pouch and going away from home). Add deodorant (such as Super Banish or Nullo) to your pouch. Use air deodorizers in your bathroom. Do not add aspirin to your pouch. Even though  aspirin can help prevent odor, it   could cause ulcers on your stoma.  When to call the doctor Call the doctor if you have any of the following symptoms: Purple, black, or white stoma Severe cramps lasting more than 6 hours Severe watery discharge from the stoma lasting more than 6 hours No output from the colostomy for 3 days Excessive bleeding from your stoma Swelling of your stoma to more than 1/2-inch larger than usual Pulling inward of your stoma below skin level Severe skin irritation or deep ulcers Bulging or other changes in your abdomen  When to call your ostomy nurse Call your ostomy/enterostomal therapy (WOCN) nurse if any of the following occurs: Frequent leaking of your pouching system Change in size or appearance of your stoma, causing discomfort or problems with your pouch Skin rash or rawness Weight gain or loss that causes problems with your pouch     FREQUENTLY ASKED QUESTIONS   Why haven't you met any of these folks who have an ostomy?  Well, maybe you have! You just did not recognize them because an ostomy doesn't show. It can be kept secret if you wish. Why, maybe some of your best friends, office associates or neighbors have an ostomy ... you never can tell. People facing ostomy surgery have many quality-of-life questions like: Will you bulge? Smell? Make noises? Will you feel waste leaving your body? Will you be a captive of the toilet? Will you starve? Be a social outcast? Get/stay married? Have babies? Easily bathe, go swimming, bend over?  OK, let's look at what you can expect:   Will you bulge?  Remember, without part of the intestine or bladder, and its contents, you should have a flatter tummy than before. You can expect to wear, with little exception, what you wore before surgery ... and this in-cludes tight clothing and bathing suits.   Will you smell?  Today, thanks to modern odor proof pouching systems, you can walk into an ostomy support group  meeting and not smell anything that is foul or offensive. And, for those with an ileostomy or colostomy who are concerned about odor when emptying their pouch, there are in-pouch deodorants that can be used to eliminate any waste odors that may exist.   Will you make noises?  Everyone produces gas, especially if they are an air-swallower. But intestinal sounds that occur from time to time are no differ-ent than a gurgling tummy, and quite often your clothing will muffle any sounds.   Will you feel the waste discharges?  For those with a colostomy or ileostomy there might be a slight pressure when waste leaves your body, but understand that the intestines have no nerve endings, so there will be no unpleasant sensations. Those with a urostomy will probably be unaware of any kidney drainage.   Will you be a captive of the toilet?  Immediately post-op you will spend more time in the bathroom than you will after your body recovers from surgery. Every person is different, but on average those with an ileostomy or urostomy may empty their pouches 4 to 6 times a day; a little  less if you have a colostomy. The average wear time between pouch system changes is 3 to 5 days and the changing process should take less than 30 minutes.   Will I need to be on a special diet? Most people return to their normal diet when they have recovered from surgery. Be sure to chew your food well, eat a well-balanced diet and drink plenty of fluids. If   you experience problems with a certain food, wait a couple of weeks and try it again.  Will there be odor and noises? Pouching systems are designed to be odor-proof or odor-resistant. There are deodorants that can be used in the pouch. Medications are also available to help reduce odor. Limit gas-producing foods and carbonated beverages. You will experience less gas and fewer noises as you heal from surgery.  How much time will it take to care for my ostomy? At first, you may  spend a lot of time learning about your ostomy and how to take care of it. As you become more comfortable and skilled at changing the pouching system, it will take very little time to care for it.   Will I be able to return to work? People with ostomies can perform most jobs. As soon as you have healed from surgery, you should be able to return to work. Heavy lifting (more than 10 pounds) may be discouraged.   What about intimacy? Sexual relationships and intimacy are important and fulfilling aspects of your life. They should continue after ostomy surgery. Intimacy-related concerns should be discussed openly between you and your partner.   Can I wear regular clothing? You do not need to wear special clothing. Ostomy pouches are fairly flat and barely noticeable. Elastic undergarments will not hurt the stoma or prevent the ostomy from functioning.   Can I participate in sports? An ostomy should not limit your involvement in sports. Many people with ostomies are runners, skiers, swimmers or participate in other active lifestyles. Talk with your caregiver first before doing heavy physical activity.  Will you starve?  Not if you follow doctor's orders at each stage of your post-op adjustment. There is no such thing as an "ostomy diet". Some people with an ostomy will be able to eat and tolerate anything; others may find diffi-culty with some foods. Each person is an individual and must determine, by trial, what is best for them. A good practice for all is to drink plenty of water.   Will you be a social outcast?  Have you met anyone who has an ostomy and is a social outcast? Why should you be the first? Only your attitude and self image will effect how you are treated. No confi-dent person is an outcast.    PROFESSIONAL HELP   Resources are available if you need help or have questions about your ostomy.   Specially trained nurses called Wound, Ostomy Continence Nurses (WOCN) are available for  consultation in most major medical centers.  Consider getting an ostomy consult at an outpatient ostomy clinic.   [facility] has an [facility] run by an WOCN ostomy nurse at the [facility].  [phone]. [facility] can help set up an appointment   The [employer] (UOA) is a group made up of many local chapters throughout the [place]. These local groups hold meetings and provide support to prospective and existing ostomates. They sponsor educational events and have qualified visitors to make personal or telephone visits. Contact the UOA for the chapter nearest you and for other educational publications.  More detailed information can be found in Colostomy Guide, a publication of the [employer] (UOA). Contact UOA at 1-800-826-0826 or visit their web site at www.uoaa.org. The website contains links to other sites, suppliers and resources.  [facility] Secure Start Services: Start at the website to enlist for support.  Your Wound Ostomy (WOCN) nurse may have started this   process. [email or web address] Secure Start services are designed to support people as they live their lives with an ostomy or neurogenic bladder. Enrolling is easy and at no cost to the patient. We realize that each person's needs and life journey are different. Through Secure Start services, we want to help people live their life, their way.  #######################################################  

## 2023-02-05 ENCOUNTER — Other Ambulatory Visit: Payer: Self-pay | Admitting: Family Medicine

## 2023-02-05 DIAGNOSIS — F32A Depression, unspecified: Secondary | ICD-10-CM

## 2023-02-05 DIAGNOSIS — M48062 Spinal stenosis, lumbar region with neurogenic claudication: Secondary | ICD-10-CM

## 2023-02-05 DIAGNOSIS — F419 Anxiety disorder, unspecified: Secondary | ICD-10-CM

## 2023-02-05 MED ORDER — TRAMADOL HCL 50 MG PO TABS: 50.0000 mg | ORAL_TABLET | Freq: Four times a day (QID) | ORAL | 0 refills | Status: DC | PRN

## 2023-02-05 NOTE — Discharge Summary (Addendum)
Physician Discharge Summary    Patient ID: Brenda Solis MRN: [id number] DOB/AGE: [birth year]  88 y.o.  Patient Care Team: Raliegh Ip, DO as PCP - General (Family Medicine) Ollen , MD as Consulting Physician (Orthopedic Surgery) Delora Fuel, [place] (Optometry) Hilarie Fredrickson, MD as Consulting Physician (Gastroenterology) Romie Levee, MD as Consulting Physician (General Surgery)  Admit date: 02/02/2023  Discharge date: 02/05/2023  Hospital Stay = 3 days    Discharge Diagnoses:  Principal Problem:   Colon cancer (HCC)   3 Days Post-Op  02/02/2023  POST-OPERATIVE DIAGNOSIS:  COLON CANCER   PROCEDURE: XI ROBOT ASSISTED RIGHT COLECTOMY    Surgeon:  Romie Levee, MD  OR FINDINGS:    Patient had mass in her anterior cecum.   No obvious metastatic disease on visceral parietal peritoneum or liver.  Nodular liver.  Consults: Case Management / Social Work and Anesthesia  Hospital Course:   The patient underwent the surgery above.  Postoperatively, the patient gradually mobilized and advanced to a solid diet.  Pain and other symptoms were treated aggressively.    By the time of discharge, the patient was walking well the hallways, eating food, having flatus.  Pain was well-controlled on an oral medications.  Based on meeting discharge criteria and continuing to recover, I felt it was safe for the patient to be discharged from the hospital to further recover with close followup. Postoperative recommendations were discussed in detail.  They are written as well.  Discharged Condition: good  Discharge Exam: Blood pressure (!) 144/59, pulse 72, temperature 97.9 F (36.6 C), temperature source Oral, resp. rate 18, height 5\' 8"  (1.727 m), weight 86.6 kg, SpO2 96%.  General: Pt awake/alert/oriented x4 in No acute distress Eyes: PERRL, normal EOM.  Sclera clear.  No icterus Neuro: CN II-XII intact w/o focal sensory/motor deficits. Lymph: No head/neck/groin  lymphadenopathy Psych:  No delerium/psychosis/paranoia HENT: Normocephalic, Mucus membranes moist.  No thrush Neck: Supple, No tracheal deviation Chest:  No chest wall pain w good excursion CV:  Pulses intact.  Regular rhythm MS: Normal AROM mjr joints.  No obvious deformity Abdomen: Soft.  Nondistended.  Mildly tender at incisions only.  Incisions c/d/I - dressings (aside from Dermabond) off.  No evidence of peritonitis.  No incarcerated hernias. Ext:  SCDs BLE.  No mjr edema.  No cyanosis Skin: No petechiae / purpura   Disposition:    Follow-up Information     Romie Levee, MD. Schedule an appointment as soon as possible for a visit on 03/02/2023.   Specialties: General Surgery, Colon and Rectal Surgery Why: To follow up after your hospital stay, To follow up after your operation Contact information: [address] [place] [address] [phone]                 Discharge disposition: 01-Home or Self Care       Discharge Instructions     Call MD for:   Complete by: As directed    FEVER > 101.5 F  (temperatures < 101.5 F are not significant)   Call MD for:  extreme fatigue   Complete by: As directed    Call MD for:  persistant dizziness or light-headedness   Complete by: As directed    Call MD for:  persistant nausea and vomiting   Complete by: As directed    Call MD for:  redness, tenderness, or signs of infection (pain, swelling, redness, odor or green/yellow discharge around incision site)   Complete by:  As directed    Call MD for:  severe uncontrolled pain   Complete by: As directed    Diet - low sodium heart healthy   Complete by: As directed    Start with a bland diet such as soups, liquids, starchy foods, low fat foods, etc. the first few days at home. Gradually advance to a solid, low-fat, high fiber diet by the end of the first week at home.   Add a fiber supplement to your diet (Metamucil, etc) If you feel full, bloated, or  constipated, stay on a full liquid or pureed/blenderized diet for a few days until you feel better and are no longer constipated.   Discharge instructions   Complete by: As directed    See Discharge Instructions If you are not getting better after two weeks or are noticing you are getting worse, contact our office [phone] for further advice.  We may need to adjust your medications, re-evaluate you in the office, send you to the emergency room, or see what other things we can do to help. The clinic staff is available to answer your questions during regular business hours (8:30am-5pm).  Please don't hesitate to call and ask to speak to one of our nurses for clinical concerns.    A surgeon from [facility] is always on call at the hospitals 24 hours/day If you have a medical emergency, go to the nearest emergency room or call 911.   Discharge wound care:   Complete by: As directed    It is good for closed incisions and even open wounds to be washed every day.  Shower every day.  Short baths are fine.  Wash the incisions and wounds clean with soap & water.     You may leave closed incisions open to air if it is dry.   You may cover the incision with clean gauze & replace it after your daily shower for comfort.   Driving Restrictions   Complete by: As directed    You may drive when: - you are no longer taking narcotic prescription pain medication - you can comfortably wear a seatbelt - you can safely make sudden turns/stops without pain.   Increase activity slowly   Complete by: As directed    Start light daily activities --- self-care, walking, climbing stairs- beginning the day after surgery.  Gradually increase activities as tolerated.  Control your pain to be active.  Stop when you are tired.  Ideally, walk several times a day, eventually an hour a day.   Most people are back to most day-to-day activities in a few weeks.  It takes 4-6 weeks to get back to unrestricted,  intense activity. If you can walk 30 minutes without difficulty, it is safe to try more intense activity such as jogging, treadmill, bicycling, low-impact aerobics, swimming, etc. Save the most intensive and strenuous activity for last (Usually 4-8 weeks after surgery) such as sit-ups, heavy lifting, contact sports, etc.  Refrain from any intense heavy lifting or straining until you are off narcotics for pain control.  You will have off days, but things should improve week-by-week. DO NOT PUSH THROUGH PAIN.  Let pain be your guide: If it hurts to do something, don't do it.   Lifting restrictions   Complete by: As directed    If you can walk 30 minutes without difficulty, it is safe to try more intense activity such as jogging, treadmill, bicycling, low-impact aerobics, swimming, etc. Save the most intensive and  strenuous activity for last (Usually 4-8 weeks after surgery) such as sit-ups, heavy lifting, contact sports, etc.   Refrain from any intense heavy lifting or straining until you are off narcotics for pain control.  You will have off days, but things should improve week-by-week. DO NOT PUSH THROUGH PAIN.  Let pain be your guide: If it hurts to do something, don't do it.  Pain is your body warning you to avoid that activity for another week until the pain goes down.   May shower / Bathe   Complete by: As directed    May walk up steps   Complete by: As directed    Sexual Activity Restrictions   Complete by: As directed    You may have sexual intercourse when it is comfortable. If it hurts to do something, stop.       Allergies as of 02/05/2023       Reactions   Tape Other (See Comments)   Skin is very sensitive to adhesive tape.   Gabapentin Other (See Comments)   Dizzy   Oxycodone Itching        Medication List     TAKE these medications    buPROPion 300 MG 24 hr tablet Commonly known as: WELLBUTRIN XL Take 1 tablet (300 mg total) by mouth daily. For depression    busPIRone 5 MG tablet Commonly known as: BUSPAR TAKE 1 TABLET BY MOUTH 2 TIMES DAILY AS NEEDED.   CALCIUM PO Take 1 tablet by mouth daily.   diclofenac Sodium 1 % Gel Commonly known as: Voltaren Apply 2 g topically 3 (three) times daily as needed (pain).   docusate sodium 100 MG capsule Commonly known as: COLACE Take 100 mg by mouth daily.   DULoxetine 60 MG capsule Commonly known as: Cymbalta Take 1 capsule (60 mg total) by mouth daily. For depression and chronic pain   ferrous sulfate 325 (65 FE) MG tablet TAKE 1 TAB IN THE MORNING AND 1 TAB AT BEDTIME. TAKE WITH VIT C OR A GLASS OF ORANGE JUICE   HYDROcodone-acetaminophen 5-325 MG tablet Commonly known as: NORCO/VICODIN Take 1 tablet by mouth in the morning, at noon, and at bedtime.   levothyroxine 75 MCG tablet Commonly known as: SYNTHROID TAKE 1 TABLET BY MOUTH EVERY DAY BEFORE BREAKFAST for thyroid   meclizine 25 MG tablet Commonly known as: ANTIVERT TAKE ONE TABLET BY MOUTH THREE TIMES DAILY AS NEEDED FOR DIZZINESS   methocarbamol 500 MG tablet Commonly known as: ROBAXIN TAKE 1 TABLET BY MOUTH EVERY 8 HOURS AS NEEDED FOR MUSCLE SPASMS.   multivitamin with minerals Tabs tablet Take 1 tablet by mouth daily.   nystatin cream Commonly known as: MYCOSTATIN Apply 1 application topically 2 (two) times daily as needed (rash).   traMADol 50 MG tablet Commonly known as: ULTRAM Take 1-2 tablets (50-100 mg total) by mouth every 6 (six) hours as needed for moderate pain.   traZODone 50 MG tablet Commonly known as: DESYREL TAKE 1-2 TABLETS (50-100 MG TOTAL) BY MOUTH AT BEDTIME AS NEEDED. FOR SLEEP   VITAMIN C PO Take 1 tablet by mouth daily.   VITAMIN D3 PO Take 1 tablet by mouth daily.               Discharge Care Instructions  (From admission, onward)           Start     Ordered   02/05/23 0000  Discharge wound care:       Comments: It is good  for closed incisions and even open wounds to be  washed every day.  Shower every day.  Short baths are fine.  Wash the incisions and wounds clean with soap & water.     You may leave closed incisions open to air if it is dry.   You may cover the incision with clean gauze & replace it after your daily shower for comfort.   02/05/23 0837            Significant Diagnostic Studies:  Results for orders placed or performed during the hospital encounter of 02/02/23 (from the past 72 hour(s))  Type and screen [facility]     Status: None (Preliminary result)   Collection Time: 02/02/23  1:00 PM  Result Value Ref Range   ABO/RH(D) A POS    Antibody Screen NEG    Sample Expiration      02/05/2023,2359 Performed at [facility], [address], [place], [place] [address]    Unit Number [id number]    Blood Component Type RED CELLS,LR    Unit division 00    Status of Unit ALLOCATED    Transfusion Status OK TO TRANSFUSE    Crossmatch Result COMPATIBLE    Unit Number [id number]    Blood Component Type RED CELLS,LR    Unit division 00    Status of Unit ALLOCATED    Transfusion Status OK TO TRANSFUSE    Crossmatch Result COMPATIBLE   Surgical pathology     Status: None   Collection Time: 02/02/23  3:25 PM  Result Value Ref Range   SURGICAL PATHOLOGY      SURGICAL PATHOLOGY CASE: [id number] PATIENT: Rogelia Mire Surgical Pathology Report     Clinical History: Colon cancer (crm)     FINAL MICROSCOPIC DIAGNOSIS:  A. RIGHT COLON, RESECTION: Invasive moderately differentiated adenocarcinoma with mucinous features Tumor invades into pericolic adipose (pT3) Tumor measures 8.2 cm in greatest dimension Tumor arises within a tubular adenoma with high-grade dysplasia Surgical margins free dysplasia and carcinoma One additional tubular adenoma without high-grade dysplasia Sixteen pericolic lymph nodes negative for carcinoma (0/16, pN0)  ONCOLOGY TABLE:  COLON AND RECTUM, CARCINOMA:   Resection  Procedure: Partial colectomy Tumor Site: Cecum Tumor Size: 8.2 cm in greatest dimension Macroscopic Tumor Perforation: Not identified Macroscopic Evaluation of Mesorectum (required for rectal cancer): Not applicable Histologic Type: Adenocarcinoma with mucinous features Histologic Grade: Moderately differentiated (low-gra de) Multiple Primary Sites: Not applicable Tumor Extension: Tumor invades pericolic adipose Lymphovascular Invasion: Not identified Perineural Invasion: Not identified Treatment Effect: No known presurgical therapy Margins:      Margin Status for Invasive Carcinoma: All margins negative for invasive carcinoma and dysplasia      Distance from Invasive Carcinoma to Radial (Circumferential) Margin: 6.2 cm      Distance from Invasive Carcinoma to Closest Mucosal Margin: 9.5 cm from proximal margin Regional Lymph Nodes:      Number of Lymph Nodes with Tumor: 0      Number of Lymph Nodes Examined: 16 Tumor Deposits: Not identified Distant Metastasis: Not applicable Pathologic Stage Classification (pTNM, AJCC 8th Edition): pT3, pN0 Ancillary Studies: MMR / MSI testing has been ordered; the results will be issued within an addendum. Representative Tumor Block: A4, A5 Comments: None (v4.2.0.1)    DESCRIPTION:  Specimen: Received fresh as "right colon" Specimen  integrity: Intact Specimen length: Terminal ileum = 6.8 cm in length by 1.5 cm in diameter, cecum = 7 cm in length by 3.5 cm in  diameter, large intestine = 12.5 cm in length by 3.5 cm in diameter.  The appendix is notably absent with a fibrotic area at the presumed previous site of appendix. Tumor location: At the level of the cecum Tumor size: 8.2 x 4.1 cm Percent of bowel circumference involved: 100% Tumor distance to margins:                      Proximal: 9.5 cm                      Distal: 14.5 cm                      Radial: 6.2 cm Macroscopic extent of tumor invasion: The tumor  grossly extends to the serosal wall and may involve the pericolic fat the wall over the tumor is inked black. Total presumed lymph nodes: The pericolic fat is palpated and 19 lymph node candidates are identified ranging from 0.1 to 2.0 cm. Extramural satellite tumor nodules: None grossly identified Mucosal polyp(s): Located 1.0 cm away from the tumor is a 0.6 x 0.4 x 0.3 cm pink-tan nod ule that is grossly confined to the mucosa. Additional findings: The remaining mucosa is tan and glistening without further distinct lesions and a thickness of 0.6 cm Block summary: A1 = proximal margin A2 = distal margin A3 = radial margin closest tumor A4-A6 = tumor to deepest extent A7 = tumor to uninvolved A8 = mucosal polypoid nodule A9 = 4 lymph node candidates, whole A10 = 4 lymph node candidates, whole A11 = 3 lymph node candidates, whole A12 = 2 lymph node candidates, whole A13 = 1 lymph node candidate, trisected A14 = 1 lymph node candidate, bisected A15 = 1 lymph node candidate, bisected A16 = 1 lymph node candidate, trisected A17 = 1 lymph node, trisected A18-A19 = 1 lymph node candidate, serially sectioned Renette Butters, 02/03/2023)   Final Diagnosis performed by Jerene Bears, MD.   Electronically signed 02/04/2023 Technical component performed at [facility], [address] [place], [place] [address].  Professional component performed  at [employer]. [facility], [address], [place], [place] [address].  Immunohistochemistry Technical component (if applicable) was performed at [facility]. [address], [place], [place] [address].   IMMUNOHISTOCHEMISTRY DISCLAIMER (if applicable): Some of these immunohistochemical stains may have been developed and the performance characteristics determine by [facility]. Some may not have been cleared or approved by the U.S. Food and Drug Administration. The FDA has determined that  such clearance or approval is not necessary. This test is used for clinical purposes. It should not be regarded as investigational or for research. This laboratory is certified under the Clinical Laboratory Improvement Amendments of 1988 (CLIA-88) as qualified to perform high complexity clinical laboratory testing.  The controls stained appropriately.   IHC stains are performed on formalin fixed, paraffin embedded tissue using a 3,3"diamin obenzidine (DAB) chromogen and Leica Bond Autostainer System. The staining intensity of the nucleus is score manually and is reported as the percentage of tumor cell nuclei demonstrating specific nuclear staining. The specimens are fixed in 10% Neutral Formalin for at least 6 hours and up to 72hrs. These tests are validated on decalcified tissue. Results should be interpreted with caution given the possibility of false negative results on decalcified specimens. Antibody Clones are as follows ER-clone 76F, PR-clone 16, Ki67- clone MM1. Some of these immunohistochemical stains  may have been developed and the performance characteristics determined by [facility].   CBC     Status: Abnormal   Collection Time: 02/03/23  4:47 AM  Result Value Ref Range   WBC 12.7 (H) 4.0 - 10.5 K/uL   RBC 3.50 (L) 3.87 - 5.11 MIL/uL   Hemoglobin 10.0 (L) 12.0 - 15.0 g/dL   HCT 69.6 (L) 29.5 - 28.4 %   MCV 92.9 80.0 - 100.0 fL   MCH 28.6 26.0 - 34.0 pg   MCHC 30.8 30.0 - 36.0 g/dL   RDW 13.2 (H) 44.0 - 10.2 %   Platelets 182 150 - 400 K/uL   nRBC 0.0 0.0 - 0.2 %    Comment: Performed at [facility], [address], [place], [place] [address]  Basic metabolic panel     Status: Abnormal   Collection Time: 02/03/23  4:47 AM  Result Value Ref Range   Sodium 134 (L) 135 - 145 mmol/L   Potassium 4.7 3.5 - 5.1 mmol/L   Chloride 108 98 - 111 mmol/L   CO2 18 (L) 22 - 32 mmol/L   Glucose, Bld 276 (H) 70 - 99 mg/dL    Comment: Glucose reference  range applies only to samples taken after fasting for at least 8 hours.   BUN 6 (L) 8 - 23 mg/dL   Creatinine, Ser 6.44 0.44 - 1.00 mg/dL   Calcium 8.8 (L) 8.9 - 10.3 mg/dL   GFR, Estimated 59 (L) >60 mL/min    Comment: (NOTE) Calculated using the CKD-EPI Creatinine Equation (2021)    Anion gap 8 5 - 15    Comment: Performed at [facility], [address], [place], [place] [address]  Glucose, capillary     Status: Abnormal   Collection Time: 02/03/23 11:45 AM  Result Value Ref Range   Glucose-Capillary 111 (H) 70 - 99 mg/dL    Comment: Glucose reference range applies only to samples taken after fasting for at least 8 hours.  Glucose, capillary     Status: Abnormal   Collection Time: 02/03/23  4:39 PM  Result Value Ref Range   Glucose-Capillary 143 (H) 70 - 99 mg/dL    Comment: Glucose reference range applies only to samples taken after fasting for at least 8 hours.  Glucose, capillary     Status: Abnormal   Collection Time: 02/03/23  8:38 PM  Result Value Ref Range   Glucose-Capillary 103 (H) 70 - 99 mg/dL    Comment: Glucose reference range applies only to samples taken after fasting for at least 8 hours.  CBC     Status: Abnormal   Collection Time: 02/04/23  4:38 AM  Result Value Ref Range   WBC 8.9 4.0 - 10.5 K/uL   RBC 3.26 (L) 3.87 - 5.11 MIL/uL   Hemoglobin 9.3 (L) 12.0 - 15.0 g/dL   HCT 25.9 (L) 56.3 - 87.5 %   MCV 90.8 80.0 - 100.0 fL   MCH 28.5 26.0 - 34.0 pg   MCHC 31.4 30.0 - 36.0 g/dL   RDW 64.3 (H) 32.9 - 51.8 %   Platelets 173 150 - 400 K/uL   nRBC 0.0 0.0 - 0.2 %    Comment: Performed at [facility], [address] [place], [place] [address]  Basic metabolic panel     Status: Abnormal   Collection Time: 02/04/23  4:38 AM  Result Value Ref Range   Sodium 136 135 - 145 mmol/L  Potassium 3.9 3.5 - 5.1 mmol/L   Chloride 106 98 - 111 mmol/L   CO2 23 22 - 32 mmol/L   Glucose, Bld 104 (H) 70 - 99 mg/dL    Comment:  Glucose reference range applies only to samples taken after fasting for at least 8 hours.   BUN 15 8 - 23 mg/dL   Creatinine, Ser 6.29 0.44 - 1.00 mg/dL   Calcium 9.1 8.9 - 52.8 mg/dL   GFR, Estimated 60 (L) >60 mL/min    Comment: (NOTE) Calculated using the CKD-EPI Creatinine Equation (2021)    Anion gap 7 5 - 15    Comment: Performed at [facility], [address] [place], [place] [address]  Glucose, capillary     Status: None   Collection Time: 02/04/23  7:15 AM  Result Value Ref Range   Glucose-Capillary 84 70 - 99 mg/dL    Comment: Glucose reference range applies only to samples taken after fasting for at least 8 hours.    No results found.  Past Medical History:  Diagnosis Date   Anemia    Anxiety    Cancer (HCC)    Depression    Heart murmur    Hyperlipidemia    Hypertension    Hypothyroidism    Insomnia, unspecified    Pneumonia    Spondylosis of unspecified site without mention of myelopathy    Thyroid disease    hypothyroidism    Past Surgical History:  Procedure Laterality Date   APPENDECTOMY     CATARACT EXTRACTION, BILATERAL Bilateral 2006   CERVICAL DISC SURGERY  1999   CESAREAN SECTION  1962 and 1967   CHOLECYSTECTOMY  1990   DECOMPRESSIVE LUMBAR LAMINECTOMY LEVEL 1 N/A 02/01/2013   Procedure: CENTRAL DECOMPRESSIVE LUMBAR LAMINECTOMY LEVEL 1 L4-L5;  Surgeon: Jacki Cones, MD;  Location: [facility];  Service: Orthopedics;  Laterality: N/A;   DECOMPRESSIVE LUMBAR LAMINECTOMY LEVEL 1 N/A 11/20/2014   Procedure: REVISION L4-S1 DECOMPRESSION, L4-L5 IN SITU FUSION (LEVEL 1);  Surgeon: Venita Lick, MD;  Location: [facility] OR;  Service: Orthopedics;  Laterality: N/A;   KNEE SURGERY Right 2009   meniscus repair   SPINAL CORD STIMULATOR INSERTION N/A 06/25/2020   Procedure: SPINAL CORD STIMULATOR INSERTION;  Surgeon: Venita Lick, MD;  Location: [facility] OR;  Service: Orthopedics;  Laterality: N/A;  3 hrs   TOTAL HIP ARTHROPLASTY  2011   RIGHT    TOTAL KNEE ARTHROPLASTY Right 08/24/2021   Procedure: TOTAL KNEE ARTHROPLASTY;  Surgeon: Ollen , MD;  Location: [facility];  Service: Orthopedics;  Laterality: Right;    Social History   Socioeconomic History   Marital status: Widowed    Spouse name: Not on file   Number of children: 3   Years of education: Not on file   Highest education level: 12th grade  Occupational History    Employer: RETIRED    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  Tobacco Use   Smoking status: Never   Smokeless tobacco: Never  Vaping Use   Vaping status: Never Used  Substance and Sexual Activity   Alcohol use: No    Alcohol/week: 0.0 standard drinks of alcohol   Drug use: No   Sexual activity: Not Currently  Other Topics Concern   Not on file  Social History Narrative   Not on file   Social Determinants of Health   Financial Resource Strain: Low Risk  (01/17/2023)   Overall Financial Resource Strain (CARDIA)    Difficulty of Paying Living  Expenses: Not very hard  Food Insecurity: No Food Insecurity (02/02/2023)   Hunger Vital Sign    Worried About Running Out of Food in the Last Year: Never true    Ran Out of Food in the Last Year: Never true  Transportation Needs: No Transportation Needs (02/02/2023)   PRAPARE - Administrator, Civil Service (Medical): No    Lack of Transportation (Non-Medical): No  Physical Activity: Inactive (01/17/2023)   Exercise Vital Sign    Days of Exercise per Week: 0 days    Minutes of Exercise per Session: 0 min  Stress: No Stress Concern Present (01/17/2023)   [employer] of Occupational Health - Occupational Stress Questionnaire    Feeling of Stress : Only a little  Social Connections: Moderately Integrated (01/17/2023)   Social Connection and Isolation Panel [NHANES]    Frequency of Communication with Friends and Family: More than three times a week    Frequency of Social Gatherings with Friends and Family: Twice a week    Attends Religious Services:  More than 4 times per year    Active Member of [employer] or Organizations: Yes    Attends Banker Meetings: 1 to 4 times per year    Marital Status: Widowed  Intimate Partner Violence: Not At Risk (02/02/2023)   Humiliation, Afraid, Rape, and Kick questionnaire    Fear of Current or Ex-Partner: No    Emotionally Abused: No    Physically Abused: No    Sexually Abused: No    Family History  Problem Relation Age of Onset   Breast cancer Mother        breast   Osteoporosis Mother    Heart disease Mother    Heart disease Father    Stroke Father    Coronary artery disease Brother        64s   Heart disease Brother    Esophageal varices Brother    Osteoporosis Maternal Aunt    Stroke Other        FAMILY   Esophageal varices Other        FAMILY   Colon cancer Neg Hx    Esophageal cancer Neg Hx    Pancreatic cancer Neg Hx    Stomach cancer Neg Hx     Current Facility-Administered Medications  Medication Dose Route Frequency Provider Last Rate Last Admin   acetaminophen (TYLENOL) tablet 1,000 mg  1,000 mg Oral Q6H Romie Levee, MD   1,000 mg at 02/04/23 2350   alum & mag hydroxide-simeth (MAALOX/MYLANTA) 200-200-20 MG/5ML suspension 30 mL  30 mL Oral Q6H PRN Romie Levee, MD       alvimopan (ENTEREG) capsule 12 mg  12 mg Oral BID Romie Levee, MD   12 mg at 02/04/23 0935   buPROPion (WELLBUTRIN XL) 24 hr tablet 300 mg  300 mg Oral Daily Romie Levee, MD   300 mg at 02/04/23 0935   diphenhydrAMINE (BENADRYL) 12.5 MG/5ML elixir 12.5 mg  12.5 mg Oral Q6H PRN Romie Levee, MD       Or   diphenhydrAMINE (BENADRYL) injection 12.5 mg  12.5 mg Intravenous Q6H PRN Romie Levee, MD       DULoxetine (CYMBALTA) DR capsule 60 mg  60 mg Oral Daily Romie Levee, MD   60 mg at 02/04/23 0935   enoxaparin (LOVENOX) injection 40 mg  40 mg Subcutaneous Q24H Romie Levee, MD   40 mg at 02/04/23 0935   feeding supplement (ENSURE SURGERY) liquid 237 mL  237 mL Oral BID BM Romie Levee, MD   237 mL at 02/04/23 1413   HYDROmorphone (DILAUDID) injection 0.5 mg  0.5 mg Intravenous Q3H PRN Romie Levee, MD       meclizine (ANTIVERT) tablet 25 mg  25 mg Oral TID PRN Romie Levee, MD       methocarbamol (ROBAXIN) tablet 500 mg  500 mg Oral Q8H PRN Romie Levee, MD   500 mg at 02/03/23 2033   ondansetron (ZOFRAN) tablet 4 mg  4 mg Oral Q6H PRN Romie Levee, MD       Or   ondansetron [facility]) injection 4 mg  4 mg Intravenous Q6H PRN Romie Levee, MD       saccharomyces boulardii (FLORASTOR) capsule 250 mg  250 mg Oral BID Romie Levee, MD   250 mg at 02/04/23 2158   simethicone (MYLICON) chewable tablet 40 mg  40 mg Oral Q6H PRN Romie Levee, MD   40 mg at 02/04/23 1413   traMADol (ULTRAM) tablet 50-100 mg  50-100 mg Oral Q6H PRN Romie Levee, MD   100 mg at 02/04/23 1410   traZODone (DESYREL) tablet 50-100 mg  50-100 mg Oral QHS PRN Romie Levee, MD   100 mg at 02/04/23 2256     Allergies  Allergen Reactions   Tape Other (See Comments)    Skin is very sensitive to adhesive tape.   Gabapentin Other (See Comments)    Dizzy   Oxycodone Itching    Signed:   Ardeth Sportsman, MD, FACS, MASCRS Esophageal, Gastrointestinal & Colorectal Surgery Robotic and Minimally Invasive Surgery  [facility] [address] [place], [place] [address] [phone] Fax [phone] Main  CONTACT INFORMATION: Weekday (9AM-5PM): Call [facility] main office at [phone] Weeknight (5PM-9AM) or Weekend/Holiday: Check EPIC "Web Links" tab & use "AMION" (password " [email or web address]") for General Surgery [facility] coverage  Please, DO NOT use SecureChat  (it is not reliable communication to reach operating surgeons & will lead to a delay in care).   Epic staff messaging available for outptient concerns needing 1-2 business day response.      02/05/2023, 8:41 AM

## 2023-02-05 NOTE — Plan of Care (Signed)
  Problem: Education: Goal: Understanding of discharge needs will improve Outcome: Adequate for Discharge Goal: Verbalization of understanding of the causes of altered bowel function will improve Outcome: Adequate for Discharge   Problem: Bowel/Gastric: Goal: Gastrointestinal status for postoperative course will improve Outcome: Adequate for Discharge   Problem: Health Behavior/Discharge Planning: Goal: Identification of community resources to assist with postoperative recovery needs will improve Outcome: Adequate for Discharge   Problem: Nutritional: Goal: Will attain and maintain optimal nutritional status will improve Outcome: Adequate for Discharge   Problem: Clinical Measurements: Goal: Postoperative complications will be avoided or minimized Outcome: Adequate for Discharge   Problem: Respiratory: Goal: Respiratory status will improve Outcome: Adequate for Discharge   Problem: Skin Integrity: Goal: Will show signs of wound healing Outcome: Adequate for Discharge   Problem: Education: Goal: Knowledge of General Education information will improve Description: Including pain rating scale, medication(s)/side effects and non-pharmacologic comfort measures Outcome: Adequate for Discharge   Problem: Health Behavior/Discharge Planning: Goal: Ability to manage health-related needs will improve Outcome: Adequate for Discharge   Problem: Clinical Measurements: Goal: Ability to maintain clinical measurements within normal limits will improve Outcome: Adequate for Discharge Goal: Will remain free from infection Outcome: Adequate for Discharge Goal: Diagnostic test results will improve Outcome: Adequate for Discharge Goal: Respiratory complications will improve Outcome: Adequate for Discharge Goal: Cardiovascular complication will be avoided Outcome: Adequate for Discharge   Problem: Activity: Goal: Risk for activity intolerance will decrease Outcome: Adequate for  Discharge   Problem: Nutrition: Goal: Adequate nutrition will be maintained Outcome: Adequate for Discharge   Problem: Coping: Goal: Level of anxiety will decrease Outcome: Adequate for Discharge   Problem: Elimination: Goal: Will not experience complications related to bowel motility Outcome: Adequate for Discharge Goal: Will not experience complications related to urinary retention Outcome: Adequate for Discharge   Problem: Pain Managment: Goal: General experience of comfort will improve Outcome: Adequate for Discharge   Problem: Safety: Goal: Ability to remain free from injury will improve Outcome: Adequate for Discharge   Problem: Skin Integrity: Goal: Risk for impaired skin integrity will decrease Outcome: Adequate for Discharge   Problem: Education: Goal: Ability to describe self-care measures that may prevent or decrease complications (Diabetes Survival Skills Education) will improve Outcome: Adequate for Discharge Goal: Individualized Educational Video(s) Outcome: Adequate for Discharge   Problem: Coping: Goal: Ability to adjust to condition or change in health will improve Outcome: Adequate for Discharge   Problem: Fluid Volume: Goal: Ability to maintain a balanced intake and output will improve Outcome: Adequate for Discharge   Problem: Health Behavior/Discharge Planning: Goal: Ability to identify and utilize available resources and services will improve Outcome: Adequate for Discharge Goal: Ability to manage health-related needs will improve Outcome: Adequate for Discharge   Problem: Metabolic: Goal: Ability to maintain appropriate glucose levels will improve Outcome: Adequate for Discharge   Problem: Nutritional: Goal: Maintenance of adequate nutrition will improve Outcome: Adequate for Discharge Goal: Progress toward achieving an optimal weight will improve Outcome: Adequate for Discharge   Problem: Skin Integrity: Goal: Risk for impaired skin  integrity will decrease Outcome: Adequate for Discharge   Problem: Tissue Perfusion: Goal: Adequacy of tissue perfusion will improve Outcome: Adequate for Discharge

## 2023-02-05 NOTE — Progress Notes (Signed)
Reviewed written d/c instructions w pt and her son, all questions answered, they both verbalized understanding. D/C via w/c w all belongings in stable condition.

## 2023-02-05 NOTE — Plan of Care (Signed)
  Problem: Bowel/Gastric: ?Goal: Gastrointestinal status for postoperative course will improve ?Outcome: Progressing ?  ?Problem: Clinical Measurements: ?Goal: Postoperative complications will be avoided or minimized ?Outcome: Progressing ?  ?

## 2023-02-07 ENCOUNTER — Telehealth: Payer: Self-pay

## 2023-02-07 NOTE — Transitions of Care (Post Inpatient/ED Visit) (Signed)
02/07/2023  Name: Brenda Solis MRN: [id number] DOB: October 21, 1934  Today's TOC FU Call Status:    Transition Care Management Follow-up Telephone Call Date of Discharge: 02/05/23 Discharge Facility: [facility]) Type of Discharge: Inpatient Admission Primary Inpatient Discharge Diagnosis:: Colon Cancer How have you been since you were released from the hospital?: Better (Per patients daughter, she is doing well although having a lot of gas.  Encouraged movement  as tolerated.) Any questions or concerns?: No  Items Reviewed: Did you receive and understand the discharge instructions provided?: Yes Medications obtained,verified, and reconciled?: Yes (Medications Reviewed) Any new allergies since your discharge?: No Dietary orders reviewed?: Yes Type of Diet Ordered:: Low Sodium Heart Healthy Do you have support at home?: Yes People in Home: child(ren), adult Name of Support/Comfort Primary Source: Amy  Medications Reviewed Today: Medications Reviewed Today     Reviewed by Jodelle Gross, RN (Case Manager) on 02/07/23 at 1126  Med List Status: <None>   Medication Order Taking? Sig Documenting Provider Last Dose Status Informant  Ascorbic Acid (VITAMIN C PO) [id number] Yes Take 1 tablet by mouth daily. [provider] Taking Active Self  buPROPion (WELLBUTRIN XL) 300 MG 24 hr tablet [id number] Yes Take 1 tablet (300 mg total) by mouth daily. For depression Delynn Flavin M, DO Taking Active Self  busPIRone (BUSPAR) 5 MG tablet [id number] Yes TAKE 1 TABLET BY MOUTH 2 TIMES DAILY AS NEEDED. Raliegh Ip, DO Taking Active Self  CALCIUM PO [id number] Yes Take 1 tablet by mouth daily. [provider] Taking Active Self  Cholecalciferol (VITAMIN D3 PO) [id number] Yes Take 1 tablet by mouth daily. [provider] Taking Active Self  diclofenac Sodium (VOLTAREN) 1 % GEL [id number] No Apply 2 g topically 3 (three) times daily as needed (pain). Delynn Flavin M, DO Unknown Active Self  docusate sodium (COLACE) 100 MG capsule [id number]  Take 100 mg by mouth daily. [provider]  Active Self  DULoxetine (CYMBALTA) 60 MG capsule [id number] Yes TAKE 1 CAPSULE (60 MG TOTAL) BY MOUTH DAILY. FOR DEPRESSION AND CHRONIC PAIN Delynn Flavin M, DO Taking Active   ferrous sulfate 325 (65 FE) MG tablet [id number]  TAKE 1 TAB IN THE MORNING AND 1 TAB AT BEDTIME. TAKE WITH VIT C OR A GLASS OF ORANGE JUICE [place], [place], DO  Active Self  HYDROcodone-acetaminophen (NORCO/VICODIN) 5-325 MG tablet [id number]  Take 1 tablet by mouth in the morning, at noon, and at bedtime. [provider]  Active Self  levothyroxine (SYNTHROID) 75 MCG tablet [id number] Yes TAKE 1 TABLET BY MOUTH EVERY DAY BEFORE BREAKFAST for thyroid Raliegh Ip, DO Taking Active Self  meclizine (ANTIVERT) 25 MG tablet [id number] Yes TAKE ONE TABLET BY MOUTH THREE TIMES DAILY AS NEEDED FOR DIZZINESS Delynn Flavin M, DO Taking Active Self  methocarbamol (ROBAXIN) 500 MG tablet [id number]  TAKE 1 TABLET BY MOUTH EVERY 8 HOURS AS NEEDED FOR MUSCLE SPASMS. Delynn Flavin M, DO  Active Self  Multiple Vitamin (MULTIVITAMIN WITH MINERALS) TABS tablet [id number]  Take 1 tablet by mouth daily. [provider]  Active Self  nystatin cream (MYCOSTATIN) [id number]  Apply 1 application topically 2 (two) times daily as needed (rash). Delynn Flavin M, DO  Active Self  traMADol Janean Sark) 50 MG tablet [id number] Yes Take 1-2 tablets (50-100 mg total) by mouth every 6 (six) hours as needed for moderate pain. Karie Soda, MD Taking Active   traZODone (DESYREL) 50 MG tablet [id number] Yes TAKE 1-2 TABLETS (  50-100 MG TOTAL) BY MOUTH AT BEDTIME AS NEEDED. FOR SLEEP Raliegh Ip, DO Taking Active Self            Home Care and Equipment/Supplies: Were Home Health Services Ordered?: No Any new equipment or medical supplies ordered?: No  Functional Questionnaire: Do  you need assistance with bathing/showering or dressing?: Yes Do you need assistance with meal preparation?: Yes Do you need assistance with eating?: No Do you have difficulty maintaining continence: No Do you need assistance with getting out of bed/getting out of a chair/moving?: No Do you have difficulty managing or taking your medications?: No  Follow up appointments reviewed: PCP Follow-up appointment confirmed?: NA Specialist Hospital Follow-up appointment confirmed?: Yes Date of Specialist follow-up appointment?: 03/01/23 Follow-Up Specialty Provider:: Dr. Maisie Fus Do you need transportation to your follow-up appointment?: No Do you understand care options if your condition(s) worsen?: Yes-patient verbalized understanding  SDOH Interventions Today    Flowsheet Row Most Recent Value  SDOH Interventions   Food Insecurity Interventions Intervention Not Indicated  Housing Interventions Intervention Not Indicated  Transportation Interventions Intervention Not Indicated     Jodelle Gross, RN, BSN, CCM Care Management Coordinator [facility] Phone: [phone]/Fax: [phone]

## 2023-02-11 ENCOUNTER — Telehealth: Payer: Self-pay | Admitting: *Deleted

## 2023-02-11 NOTE — Progress Notes (Signed)
  Care Coordination   Note   02/11/2023 Name: Brenda Solis MRN: [id number] DOB: 02/27/1935  Brenda Solis is a 88 y.o. year old female who sees Raliegh Ip, DO for primary care. I reached out to Brenda Solis by phone today to offer care coordination services.  [name] was given information about Care Coordination services today including:   The Care Coordination services include support from the care team which includes your Nurse Coordinator, Clinical Social Worker, or Pharmacist.  The Care Coordination team is here to help remove barriers to the health concerns and goals most important to you. Care Coordination services are voluntary, and the patient may decline or stop services at any time by request to their care team member.   Care Coordination Consent Status: Patient did not agree to participate in care coordination services at this time.  Follow up plan:  none indicated pt declined   Encounter Outcome:  Pt. Refused  Burman Nieves, CCMA Care Coordination Care Guide Direct Dial: [phone]

## 2023-02-23 ENCOUNTER — Other Ambulatory Visit: Payer: Self-pay

## 2023-02-23 NOTE — Progress Notes (Signed)
The proposed treatment discussed in conference is for discussion purpose only and is not a binding recommendation.  The patients have not been physically examined, or presented with their treatment options.  Therefore, final treatment plans cannot be decided.  

## 2023-02-24 IMAGING — DX DG CHEST 2V
2 series · 2 of 2 positions shown · non-contrast
Comparison: 06/23/2020

CLINICAL DATA: 86-year-old female with cough

EXAM:
CHEST - 2 VIEW

[chest pa]
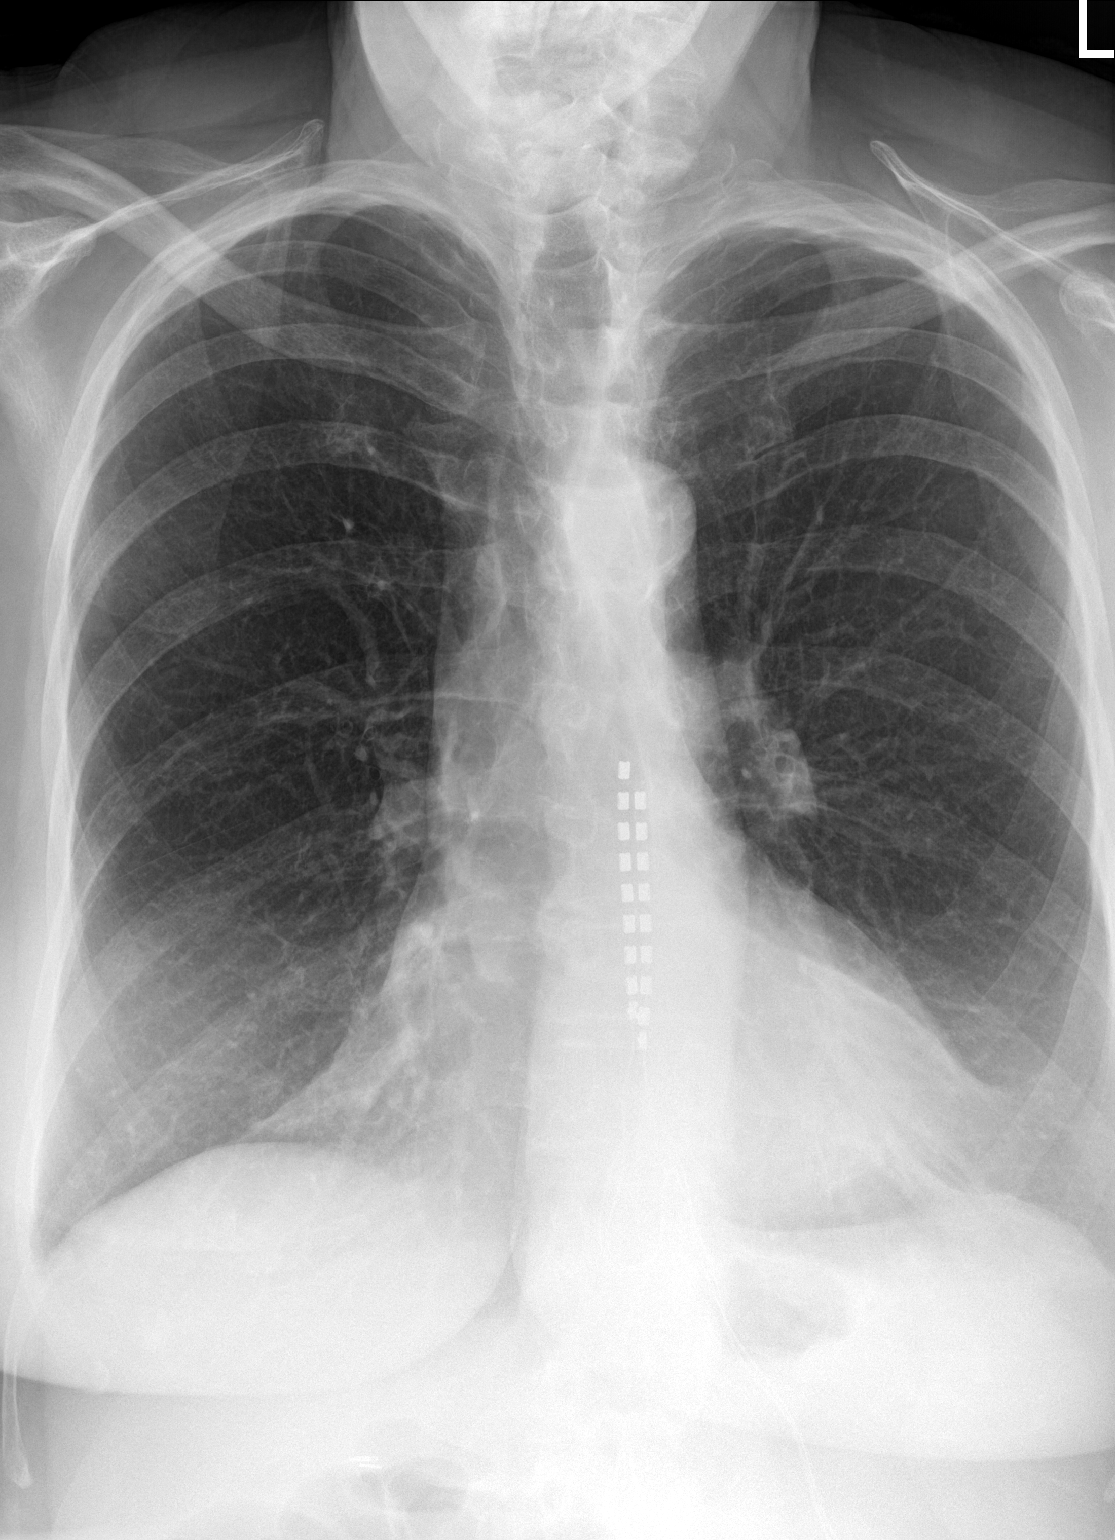

[chest lat]
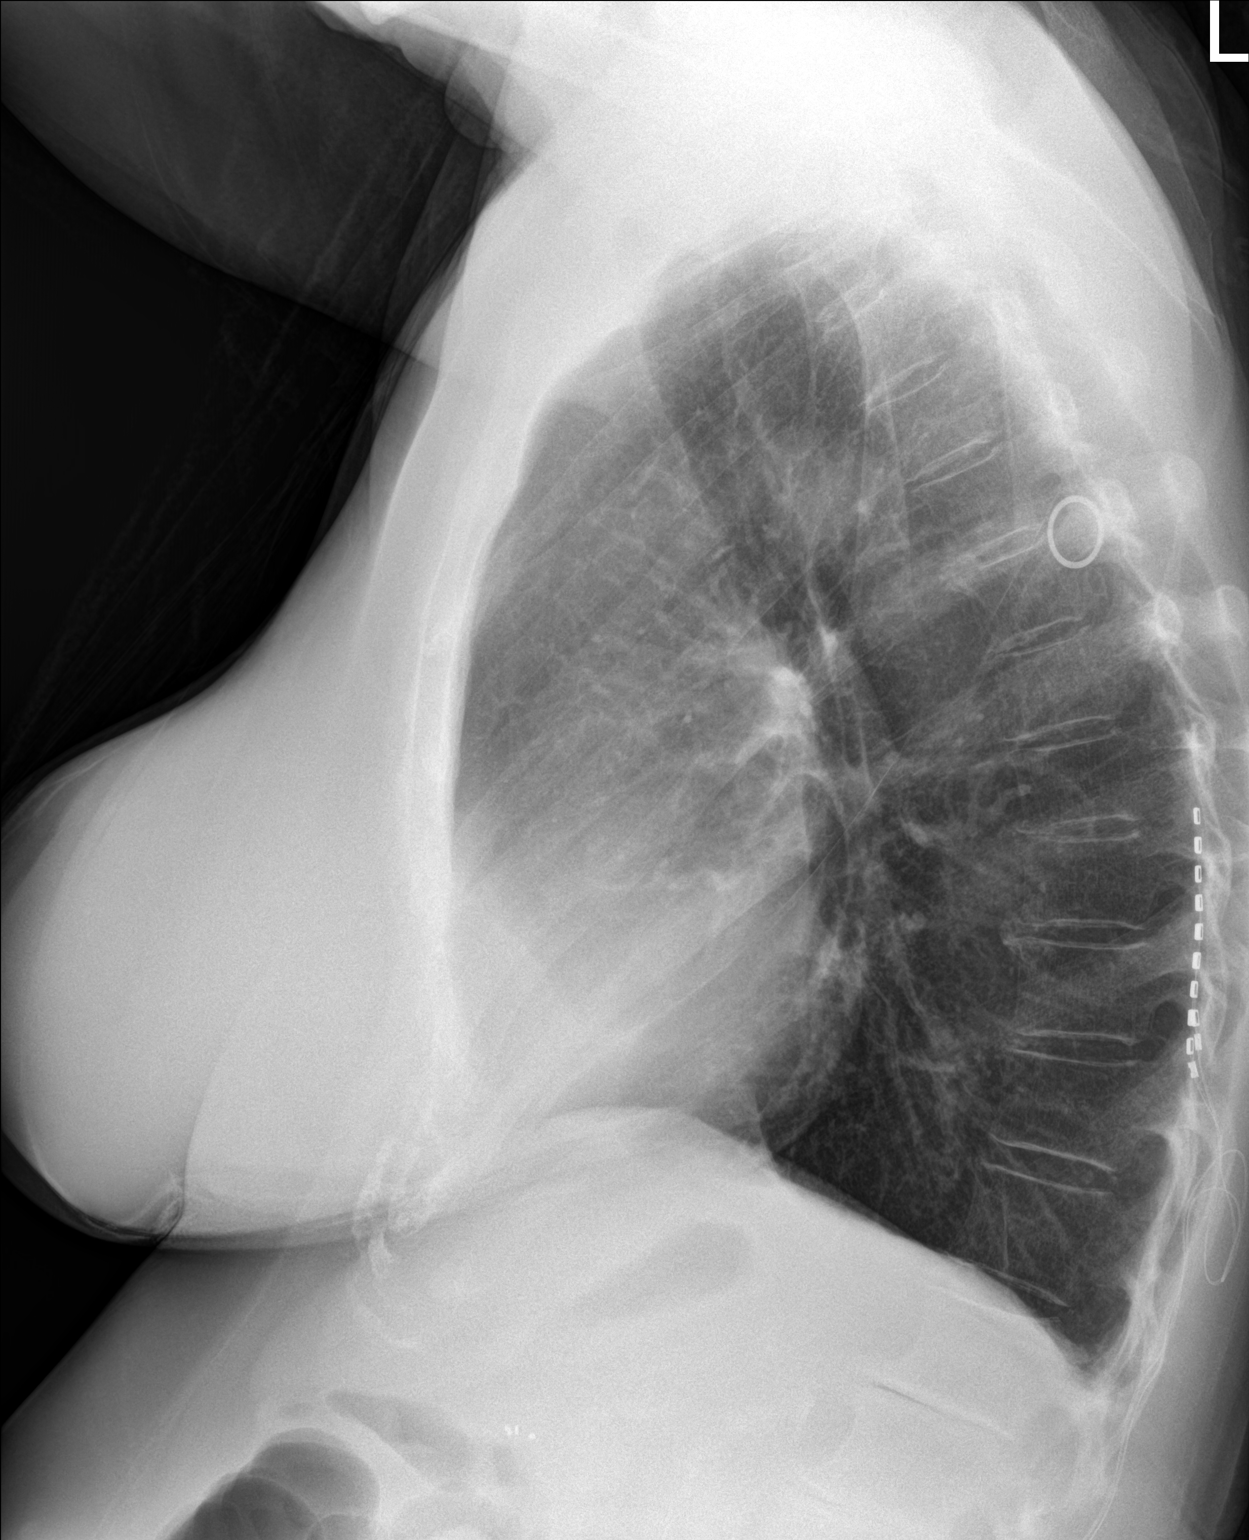

[2 of 2 positions shown; findings below may reference images not displayed]

FINDINGS: Cardiomediastinal silhouette unchanged in size and contour. No
evidence of central vascular congestion. No interlobular septal
thickening.

Stigmata of emphysema, with increased retrosternal airspace,
flattened hemidiaphragms, increased AP diameter, and hyperinflation
on the AP view.

Interval placement of electrodes in the posterior thoracic canal,
midline

No pneumothorax or pleural effusion. Coarsened interstitial
markings, with no confluent airspace disease.

No acute displaced fracture. Degenerative changes of the spine.
IMPRESSION: Negative for acute cardiopulmonary disease

## 2023-03-06 ENCOUNTER — Other Ambulatory Visit: Payer: Self-pay | Admitting: Family Medicine

## 2023-03-06 DIAGNOSIS — F32A Depression, unspecified: Secondary | ICD-10-CM

## 2023-03-08 ENCOUNTER — Encounter: Payer: Self-pay | Admitting: Family Medicine

## 2023-03-08 ENCOUNTER — Ambulatory Visit (INDEPENDENT_AMBULATORY_CARE_PROVIDER_SITE_OTHER): Payer: Medicare Other | Admitting: Family Medicine

## 2023-03-08 ENCOUNTER — Other Ambulatory Visit: Payer: Self-pay | Admitting: Family Medicine

## 2023-03-08 VITALS — BP 136/69 | HR 78 | Temp 98.8°F | Ht 68.0 in | Wt 188.0 lb

## 2023-03-08 DIAGNOSIS — Z23 Encounter for immunization: Secondary | ICD-10-CM

## 2023-03-08 DIAGNOSIS — R630 Anorexia: Secondary | ICD-10-CM

## 2023-03-08 DIAGNOSIS — D509 Iron deficiency anemia, unspecified: Secondary | ICD-10-CM | POA: Diagnosis not present

## 2023-03-08 DIAGNOSIS — C182 Malignant neoplasm of ascending colon: Secondary | ICD-10-CM | POA: Diagnosis not present

## 2023-03-08 DIAGNOSIS — R739 Hyperglycemia, unspecified: Secondary | ICD-10-CM

## 2023-03-08 DIAGNOSIS — E039 Hypothyroidism, unspecified: Secondary | ICD-10-CM | POA: Diagnosis not present

## 2023-03-08 DIAGNOSIS — M779 Enthesopathy, unspecified: Secondary | ICD-10-CM

## 2023-03-08 LAB — BAYER DCA HB A1C WAIVED: HB A1C (BAYER DCA - WAIVED): 5.5 % (ref 4.8–5.6)

## 2023-03-08 MED ORDER — METHYLPREDNISOLONE ACETATE 40 MG/ML IJ SUSP
40.0000 mg | Freq: Once | INTRAMUSCULAR | Status: AC
Start: 2023-03-08 — End: 2023-03-08
  Administered 2023-03-08: 40 mg via INTRAMUSCULAR

## 2023-03-08 NOTE — Progress Notes (Signed)
Subjective: CC: Follow-up colon cancer PCP: Raliegh Ip, DO WUJ:WJXB Brenda Solis is a 88 y.o. female presenting to clinic today for:  1.  Colon cancer Patient is status post resection of colorectal cancer.  She has follow-up in 6 months.  She reports doing well overall.  She is accompanied today by her son.  She is not eating as well as she should be and admits that just because she does not have a taste for most things lately.  She gets a few bites down and then loses interest in the meal.  She is supplementing with boost.  She denies any associated nausea, vomiting, early satiety, blood in stool or trouble with bowel movements.  Nothing specific preventing her from eating except that she just does not want it.  She has not been as active as she wants to be.  She takes her Norco roughly 1-2 times daily at max even though it is prescribed 3 times daily.  Her back pain does prevent her from being more active.  She feels like her mood is well-controlled with current regimen and rarely uses the BuSpar for anxiety   ROS: Per HPI  Allergies  Allergen Reactions   Tape Other (See Comments)    Skin is very sensitive to adhesive tape.   Gabapentin Other (See Comments)    Dizzy   Oxycodone Itching   Past Medical History:  Diagnosis Date   Anemia    Anxiety    Cancer (HCC)    Depression    Heart murmur    Hyperlipidemia    Hypertension    Hypothyroidism    Insomnia, unspecified    Pneumonia    Spondylosis of unspecified site without mention of myelopathy    Thyroid disease    hypothyroidism    Current Outpatient Medications:    Ascorbic Acid (VITAMIN C PO), Take 1 tablet by mouth daily., Disp: , Rfl:    buPROPion (WELLBUTRIN XL) 300 MG 24 hr tablet, TAKE 1 TABLET (300 MG TOTAL) BY MOUTH DAILY. FOR DEPRESSION, Disp: 90 tablet, Rfl: 0   busPIRone (BUSPAR) 5 MG tablet, TAKE 1 TABLET BY MOUTH 2 TIMES DAILY AS NEEDED., Disp: 180 tablet, Rfl: 3   CALCIUM PO, Take 1 tablet by mouth  daily., Disp: , Rfl:    Cholecalciferol (VITAMIN D3 PO), Take 1 tablet by mouth daily., Disp: , Rfl:    diclofenac Sodium (VOLTAREN) 1 % GEL, Apply 2 g topically 3 (three) times daily as needed (pain)., Disp: 200 g, Rfl: PRN   docusate sodium (COLACE) 100 MG capsule, Take 100 mg by mouth daily., Disp: , Rfl:    DULoxetine (CYMBALTA) 60 MG capsule, TAKE 1 CAPSULE (60 MG TOTAL) BY MOUTH DAILY. FOR DEPRESSION AND CHRONIC PAIN, Disp: 90 capsule, Rfl: 1   ferrous sulfate 325 (65 FE) MG tablet, TAKE 1 TAB IN THE MORNING AND 1 TAB AT BEDTIME. TAKE WITH VIT C OR A GLASS OF ORANGE JUICE, Disp: 180 tablet, Rfl: 0   HYDROcodone-acetaminophen (NORCO/VICODIN) 5-325 MG tablet, Take 1 tablet by mouth in the morning, at noon, and at bedtime., Disp: , Rfl:    levothyroxine (SYNTHROID) 75 MCG tablet, TAKE 1 TABLET BY MOUTH EVERY DAY BEFORE BREAKFAST FOR THYROID, Disp: 90 tablet, Rfl: 0   meclizine (ANTIVERT) 25 MG tablet, TAKE ONE TABLET BY MOUTH THREE TIMES DAILY AS NEEDED FOR DIZZINESS, Disp: 30 tablet, Rfl: 2   methocarbamol (ROBAXIN) 500 MG tablet, TAKE 1 TABLET BY MOUTH EVERY 8 HOURS AS NEEDED  FOR MUSCLE SPASMS., Disp: 40 tablet, Rfl: 0   Multiple Vitamin (MULTIVITAMIN WITH MINERALS) TABS tablet, Take 1 tablet by mouth daily., Disp: , Rfl:    nystatin cream (MYCOSTATIN), Apply 1 application topically 2 (two) times daily as needed (rash)., Disp: 30 g, Rfl: 2   traZODone (DESYREL) 50 MG tablet, TAKE 1-2 TABLETS (50-100 MG TOTAL) BY MOUTH AT BEDTIME AS NEEDED. 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  Tobacco Use    Smoking status: Never   Smokeless tobacco: Never  Vaping Use   Vaping status: Never Used  Substance and Sexual Activity   Alcohol use: No    Alcohol/week: 0.0 standard drinks of alcohol   Drug use: No   Sexual activity: Not Currently  Other Topics Concern   Not on file  Social History Narrative   Not on file   Social Determinants of Health   Financial Resource Strain: Low Risk  (01/17/2023)   Overall Financial Resource Strain (CARDIA)    Difficulty of Paying Living Expenses: Not very hard  Food Insecurity: No Food Insecurity (02/07/2023)   Hunger Vital Sign    Worried About Running Out of Food in the Last Year: Never true    Ran Out of Food in the Last Year: Never true  Transportation Needs: No Transportation Needs (02/07/2023)   PRAPARE - Administrator, Civil Service (Medical): No    Lack of Transportation (Non-Medical): No  Physical Activity: Inactive (01/17/2023)   Exercise Vital Sign    Days of Exercise per Week: 0 days    Minutes of Exercise per Session: 0 min  Stress: No Stress Concern Present (01/17/2023)   [employer] of Occupational Health - Occupational Stress Questionnaire    Feeling of Stress : Only a little  Social Connections: Moderately Integrated (01/17/2023)   Social Connection and Isolation Panel [NHANES]    Frequency of Communication with Friends and Family: More than three times a week    Frequency of Social Gatherings with Friends and Family: Twice a week    Attends Religious Services: More than 4 times per year    Active Member of [employer] or Organizations: Yes    Attends Banker Meetings: 1 to 4 times per year    Marital Status: Widowed  Intimate Partner Violence: Not At Risk (02/02/2023)   Humiliation, Afraid, Rape, and Kick questionnaire    Fear of Current or Ex-Partner: No    Emotionally Abused: No    Physically Abused: No    Sexually Abused: No   Family History  Problem Relation Age of Onset   Breast cancer Mother         breast   Osteoporosis Mother    Heart disease Mother    Heart disease Father    Stroke Father    Coronary artery disease Brother        47s   Heart disease Brother    Esophageal varices Brother    Osteoporosis Maternal Aunt    Stroke Other        FAMILY   Esophageal  varices Other        FAMILY   Colon cancer Neg Hx    Esophageal cancer Neg Hx    Pancreatic cancer Neg Hx    Stomach cancer Neg Hx     Objective: Office vital signs reviewed. BP 136/69   Pulse 78   Temp 98.8 F (37.1 C)   Ht 5\' 8"  (1.727 m)   Wt 188 lb (85.3 kg)   SpO2 95%   BMI 28.59 kg/m   Physical Examination:  General: Awake, alert, well nourished, No acute distress HEENT: sclera white, MMM.  No conjunctival pallor appreciated Cardio: regular rate and rhythm, S1S2 heard, no murmurs appreciated Pulm: clear to auscultation bilaterally, no wheezes, rhonchi or rales; normal work of breathing on room air MSK: ambulating with cane.  No gross deformity of the right upper extremity over area of concern     03/08/2023    3:20 PM 11/09/2022    3:29 PM 09/02/2022    9:23 AM  Depression screen PHQ 2/9  Decreased Interest 0 0 0  Down, Depressed, Hopeless 0 0 0  PHQ - 2 Score 0 0 0  Altered sleeping 0 0 0  Tired, decreased energy 0 0 0  Change in appetite 0 0 0  Feeling bad or failure about yourself  0 0 0  Trouble concentrating 0 0 0  Moving slowly or fidgety/restless 0 0 0  Suicidal thoughts 0 0 0  PHQ-9 Score 0 0 0  Difficult doing work/chores Not difficult at all Not difficult at all Not difficult at all      03/08/2023    3:21 PM 11/09/2022    3:29 PM 08/09/2022    3:27 PM 05/05/2022   12:00 PM  GAD 7 : Generalized Anxiety Score  Nervous, Anxious, on Edge 0 0 0 0  Control/stop worrying 0 0 0 0  Worry too much - different things 0 0 0 0  Trouble relaxing 0 0 0 0  Restless 0 0 0 0  Easily annoyed or irritable 0 0 0 0  Afraid - awful might happen 0 0 0 0  Total GAD 7 Score 0 0 0 0  Anxiety  Difficulty Not difficult at all Not difficult at all Not difficult at all Not difficult at all      Assessment/ Plan: 88 y.o. female   Acquired hypothyroidism - Plan: TSH, T4, free  Iron deficiency anemia, unspecified iron deficiency anemia type - Plan: CBC with Differential  Poor appetite - Plan: CBC with Differential  Malignant neoplasm of ascending colon (HCC) - Plan: CBC with Differential  Elevated serum glucose - Plan: Bayer DCA Hb A1c Waived  Tendinitis - Plan: methylPREDNISolone acetate (DEPO-MEDROL) injection 40 mg  Check thyroid levels, CBC given reports of fatigue.  I am also going to collect an A1c given some elevations in serum glucose noted on several panels recently.  I suspect that the generalized fatigue is secondary to advancing age, generalized physical deconditioning in the setting of recent colon cancer that was resected.  We discussed adequate food intake including balanced diet and volume.  Encourage p.o. hydration and exercise as tolerated.  Depo-Medrol administered for right sided tendinitis.  If ongoing, may need to consider referral to orthopedics   Raliegh Ip, DO [facility] [phone]

## 2023-03-09 ENCOUNTER — Other Ambulatory Visit: Payer: Self-pay | Admitting: Family Medicine

## 2023-03-09 DIAGNOSIS — E039 Hypothyroidism, unspecified: Secondary | ICD-10-CM

## 2023-03-09 LAB — CBC WITH DIFFERENTIAL/PLATELET
Basophils Absolute: 0 10*3/uL (ref 0.0–0.2)
Basos: 1 %
EOS (ABSOLUTE): 0.3 10*3/uL (ref 0.0–0.4)
Eos: 3 %
Hematocrit: 38 % (ref 34.0–46.6)
Hemoglobin: 11.8 g/dL (ref 11.1–15.9)
Immature Grans (Abs): 0 10*3/uL (ref 0.0–0.1)
Immature Granulocytes: 0 %
Lymphocytes Absolute: 3.3 10*3/uL — ABNORMAL HIGH (ref 0.7–3.1)
Lymphs: 42 %
MCH: 28.9 pg (ref 26.6–33.0)
MCHC: 31.1 g/dL — ABNORMAL LOW (ref 31.5–35.7)
MCV: 93 fL (ref 79–97)
Monocytes Absolute: 0.9 10*3/uL (ref 0.1–0.9)
Monocytes: 11 %
Neutrophils Absolute: 3.4 10*3/uL (ref 1.4–7.0)
Neutrophils: 43 %
Platelets: 237 10*3/uL (ref 150–450)
RBC: 4.09 x10E6/uL (ref 3.77–5.28)
RDW: 14.6 % (ref 11.7–15.4)
WBC: 7.9 10*3/uL (ref 3.4–10.8)

## 2023-03-09 LAB — TSH: TSH: 4.66 u[IU]/mL — ABNORMAL HIGH (ref 0.450–4.500)

## 2023-03-09 LAB — T4, FREE: Free T4: 1.26 ng/dL (ref 0.82–1.77)

## 2023-03-09 MED ORDER — LEVOTHYROXINE SODIUM 88 MCG PO TABS
ORAL_TABLET | ORAL | 1 refills | Status: DC
Start: 2023-03-09 — End: 2023-06-17

## 2023-03-29 DIAGNOSIS — M5136 Other intervertebral disc degeneration, lumbar region with discogenic back pain only: Secondary | ICD-10-CM | POA: Diagnosis not present

## 2023-03-29 DIAGNOSIS — M961 Postlaminectomy syndrome, not elsewhere classified: Secondary | ICD-10-CM | POA: Diagnosis not present

## 2023-03-29 DIAGNOSIS — Z9689 Presence of other specified functional implants: Secondary | ICD-10-CM | POA: Diagnosis not present

## 2023-03-29 DIAGNOSIS — M5416 Radiculopathy, lumbar region: Secondary | ICD-10-CM | POA: Diagnosis not present

## 2023-03-31 IMAGING — DX DG CHEST 2V
3 series · 3 of 3 positions shown · non-contrast
Comparison: 06/04/2021

CLINICAL DATA: 86-year-old female with cough

EXAM:
CHEST - 2 VIEW

[chest pa (1 of 2)]
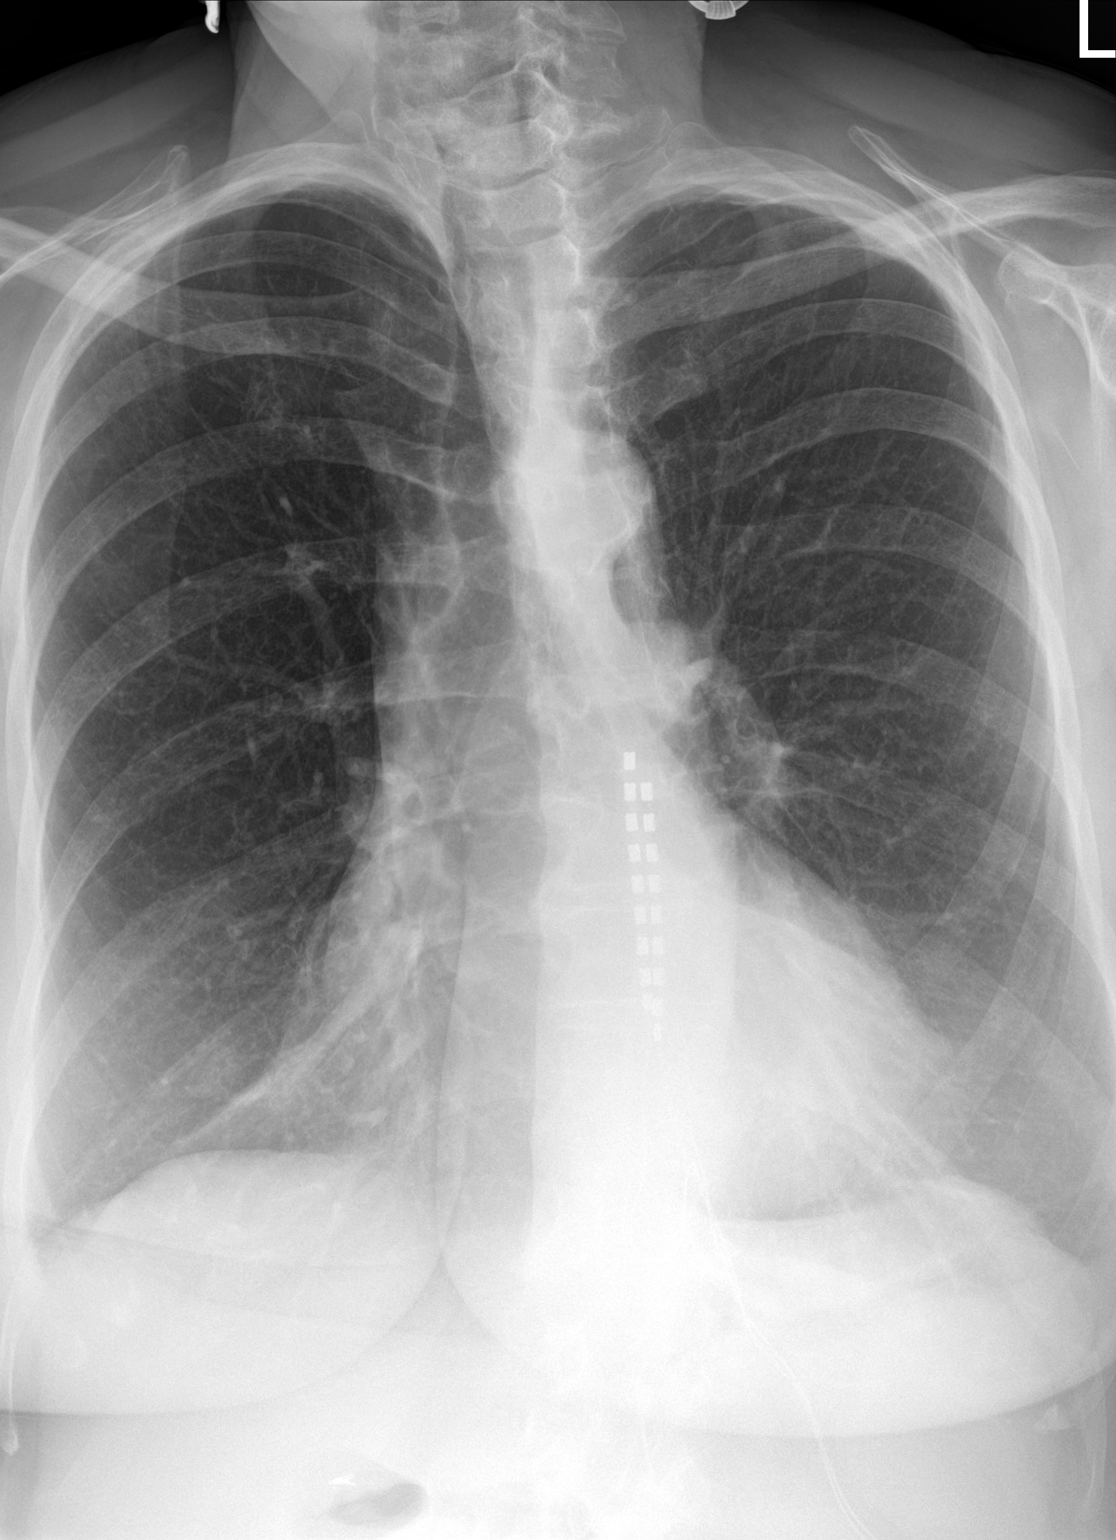

[chest lat]
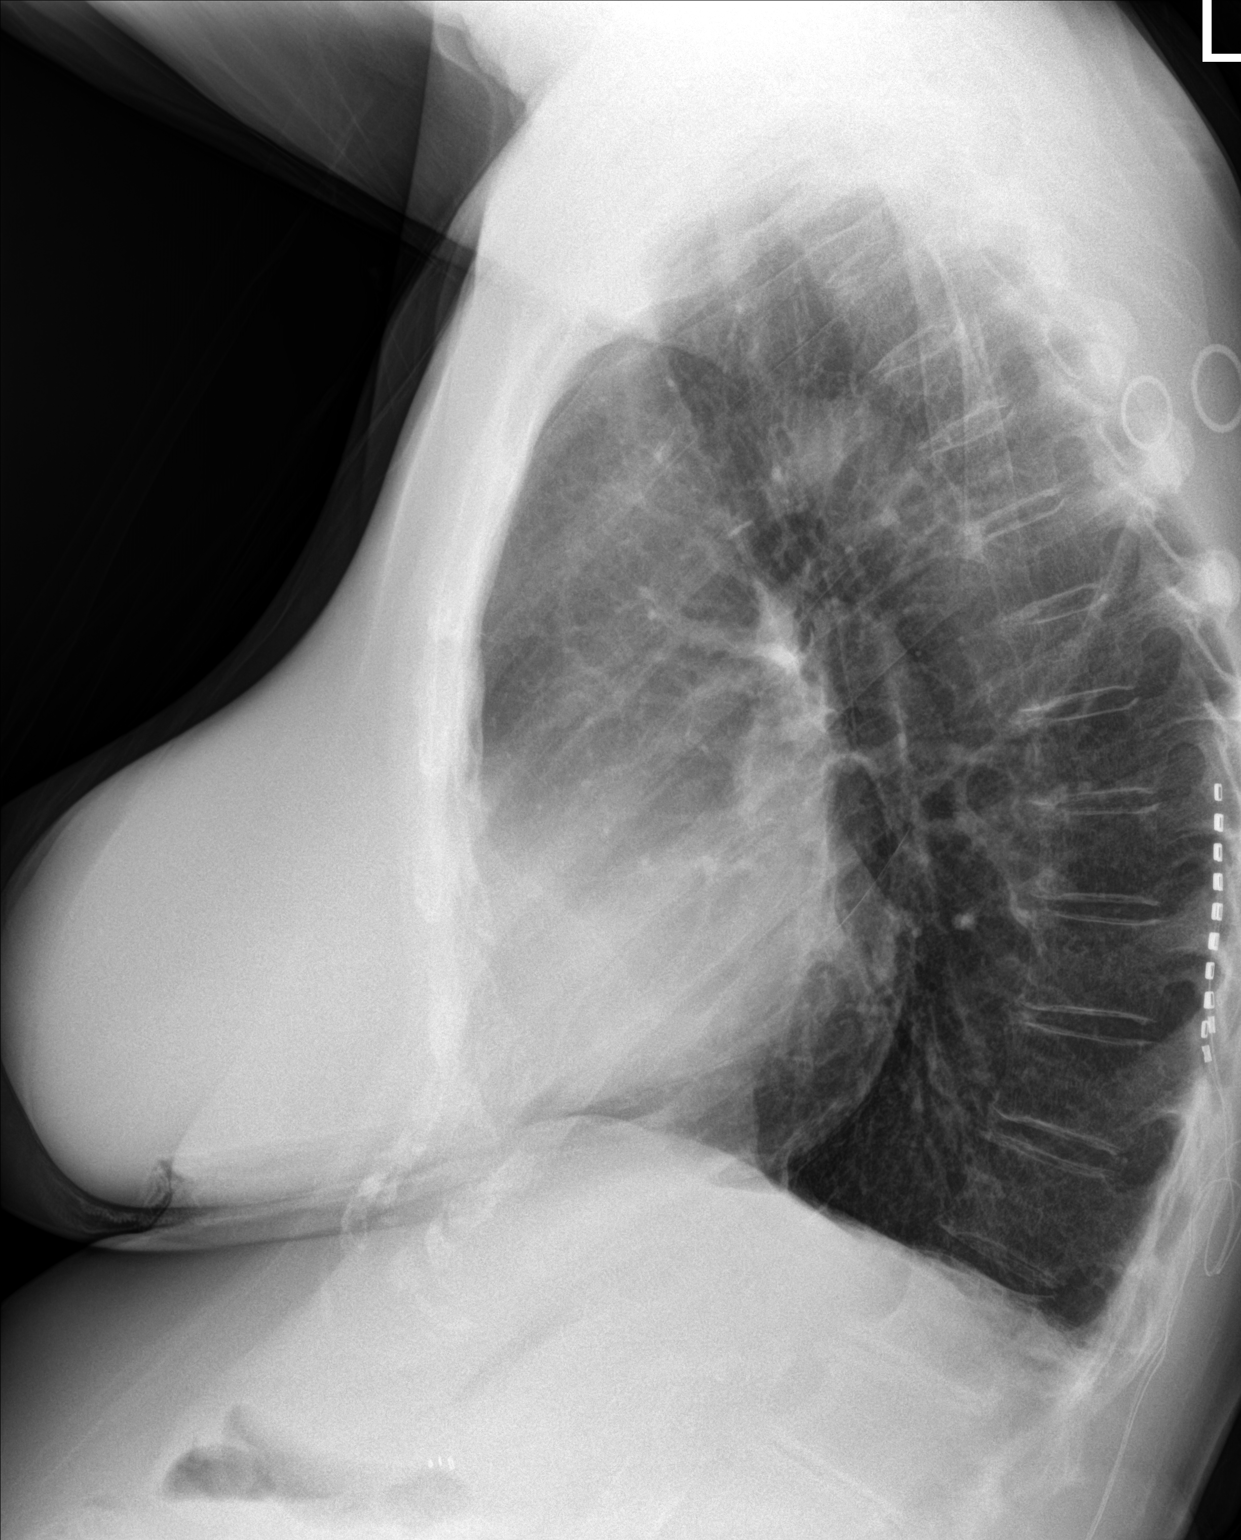

[chest pa (2 of 2)]
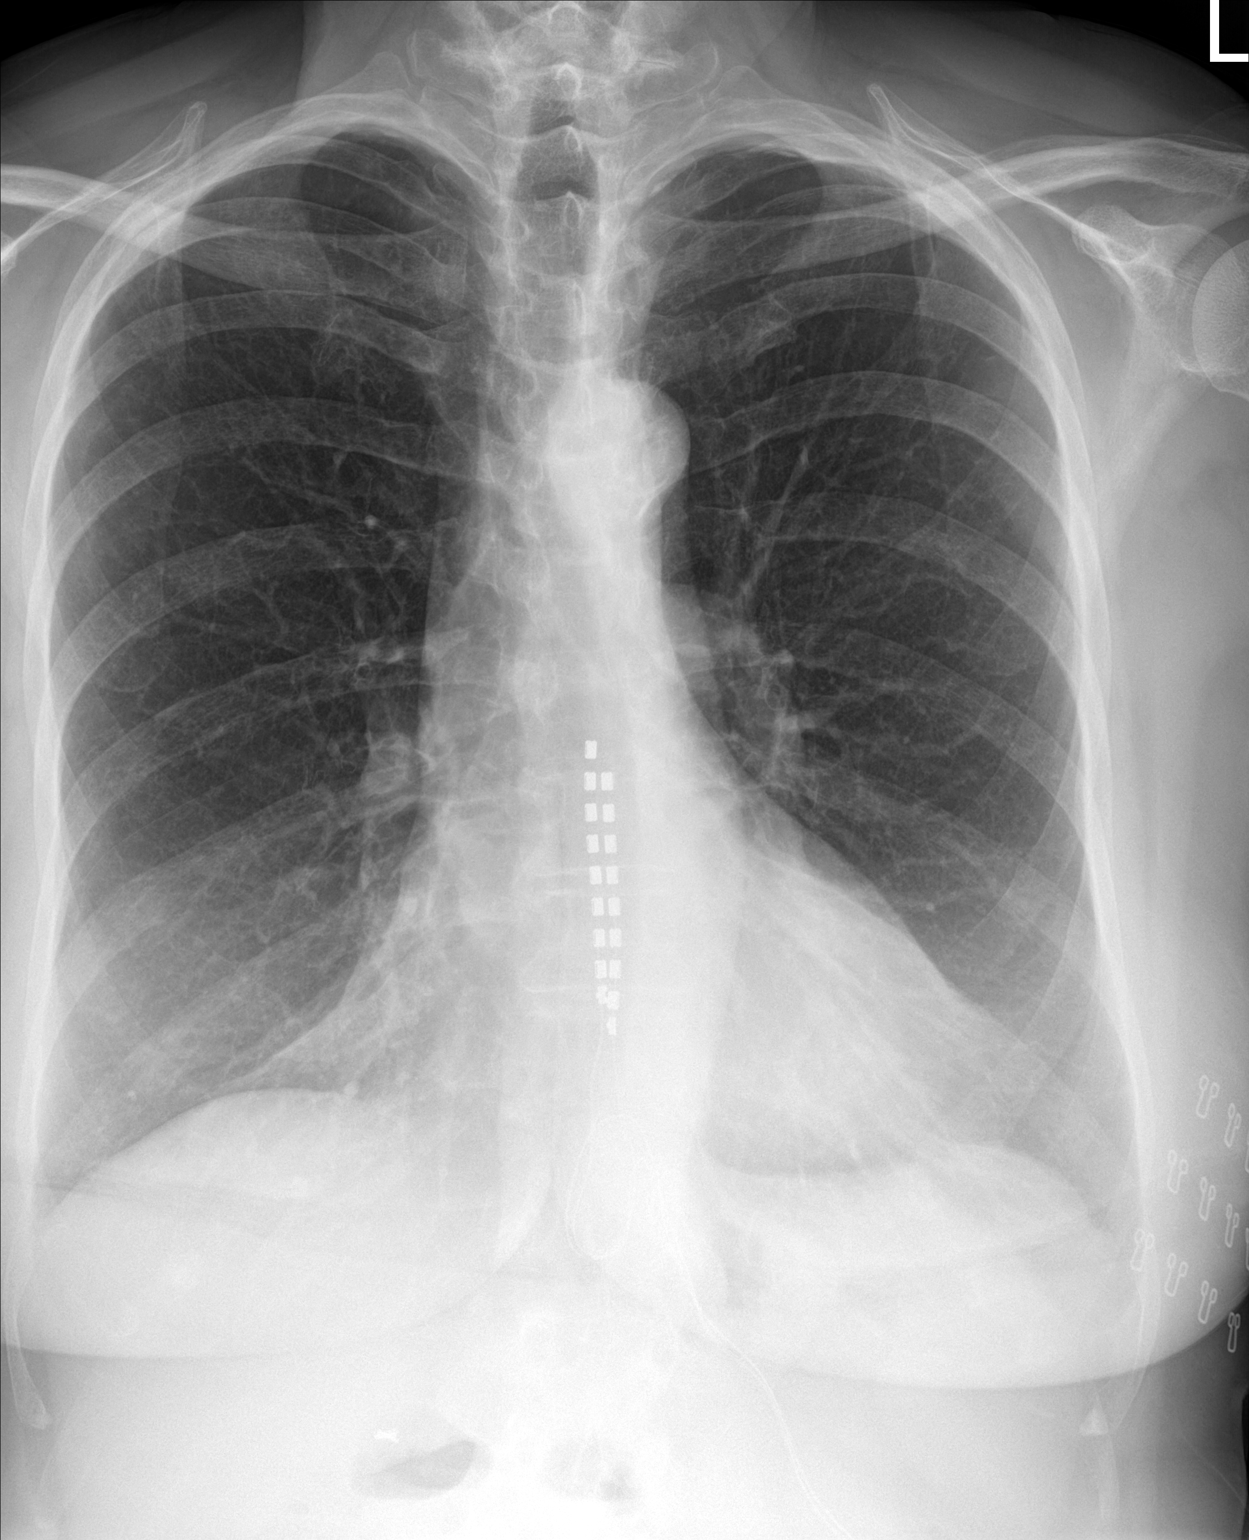

[3 of 3 positions shown; findings below may reference images not displayed]

FINDINGS: Cardiomediastinal silhouette unchanged in size and contour. No
evidence of central vascular congestion. No interlobular septal
thickening.

Stigmata of emphysema, with increased retrosternal airspace,
flattened hemidiaphragms, increased AP diameter, and hyperinflation
on the AP view.

No pneumothorax or pleural effusion. Coarsened interstitial
markings, with no confluent airspace disease.

No acute displaced fracture. Degenerative changes of the spine.

Electrodes project within the midline of the thoracic region of the
posterior canal
IMPRESSION: Emphysema without evidence of acute cardiopulmonary disease

## 2023-05-23 ENCOUNTER — Ambulatory Visit: Payer: Medicare Other | Admitting: Family Medicine

## 2023-05-23 VITALS — BP 119/63 | HR 64 | Temp 98.8°F | Ht 68.0 in | Wt 182.2 lb

## 2023-05-23 DIAGNOSIS — G8929 Other chronic pain: Secondary | ICD-10-CM

## 2023-05-23 DIAGNOSIS — E039 Hypothyroidism, unspecified: Secondary | ICD-10-CM

## 2023-05-23 DIAGNOSIS — M48062 Spinal stenosis, lumbar region with neurogenic claudication: Secondary | ICD-10-CM

## 2023-05-23 DIAGNOSIS — Z9049 Acquired absence of other specified parts of digestive tract: Secondary | ICD-10-CM

## 2023-05-23 DIAGNOSIS — M25511 Pain in right shoulder: Secondary | ICD-10-CM

## 2023-05-23 DIAGNOSIS — Z23 Encounter for immunization: Secondary | ICD-10-CM

## 2023-05-23 DIAGNOSIS — I951 Orthostatic hypotension: Secondary | ICD-10-CM | POA: Diagnosis not present

## 2023-05-23 NOTE — Addendum Note (Signed)
Addended by: Raliegh Ip on: 05/23/2023 04:43 PM   Modules accepted: Level of Service

## 2023-05-23 NOTE — Progress Notes (Signed)
Subjective: Brenda Solis check PCP: Raliegh Ip, DO [id number] Brenda Solis is a 88 y.o. female presenting to clinic today for:  1.  Status post colectomy after colon cancer removal Patient notes her appetite has gotten better.  Stools are normal.  Denies any rectal bleeding, nausea, vomiting or abdominal pain.  She still wants to lose a few more pounds.  2.  Dizziness Reports some dizziness that occurred with positional changes when she bent over.  She admits that she does not hydrate as well as she should.  She is accompanied today by her daughter-in-law.  She utilizes cane for ambulation.  She admits that she does not walk much but when she does she uses a rolling walker and this makes it easier for her.  Not doing any seated exercises.  Primarily sedentary.  3.  Hypothyroidism Patient reports improvement in energy with advanced dose of Synthroid.  He reports of changes in bowel habits, heart palpitations or increased anxiety or tremor.  4.  Shoulder pain Persistent right sided anterior shoulder pain.  She notes this is more prominent with raising her arm above head level.  Had a corticosteroid injection intramuscularly last visit.  ROS: Per HPI  Allergies  Allergen Reactions   Tape Other (See Comments)    Skin is very sensitive to adhesive tape.   Gabapentin Other (See Comments)    Dizzy   Oxycodone Itching   Past Medical History:  Diagnosis Date   Anemia    Anxiety    Cancer (HCC)    Depression    Heart murmur    Hyperlipidemia    Hypertension    Hypothyroidism    Insomnia, unspecified    Pneumonia    Spondylosis of unspecified site without mention of myelopathy    Thyroid disease    hypothyroidism    Current Outpatient Medications:    Ascorbic Acid (VITAMIN C PO), Take 1 tablet by mouth daily., Disp: , Rfl:    buPROPion (WELLBUTRIN XL) 300 MG 24 hr tablet, TAKE 1 TABLET (300 MG TOTAL) BY MOUTH DAILY. FOR DEPRESSION, Disp: 90 tablet, Rfl: 0   busPIRone (BUSPAR)  5 MG tablet, TAKE 1 TABLET BY MOUTH 2 TIMES DAILY AS NEEDED., Disp: 180 tablet, Rfl: 3   CALCIUM PO, Take 1 tablet by mouth daily., Disp: , Rfl:    Cholecalciferol (VITAMIN D3 PO), Take 1 tablet by mouth daily., Disp: , Rfl:    diclofenac Sodium (VOLTAREN) 1 % GEL, Apply 2 g topically 3 (three) times daily as needed (pain)., Disp: 200 g, Rfl: PRN   docusate sodium (COLACE) 100 MG capsule, Take 100 mg by mouth daily., Disp: , Rfl:    DULoxetine (CYMBALTA) 60 MG capsule, TAKE 1 CAPSULE (60 MG TOTAL) BY MOUTH DAILY. FOR DEPRESSION AND CHRONIC PAIN, Disp: 90 capsule, Rfl: 1   ferrous sulfate 325 (65 FE) MG tablet, TAKE 1 TAB IN THE MORNING AND 1 TAB AT BEDTIME. TAKE WITH VIT C OR A GLASS OF ORANGE JUICE, Disp: 180 tablet, Rfl: 0   HYDROcodone-acetaminophen (NORCO/VICODIN) 5-325 MG tablet, Take 1 tablet by mouth in the morning, at noon, and at bedtime., Disp: , Rfl:    levothyroxine (SYNTHROID) 88 MCG tablet, TAKE 1 TABLET BY MOUTH EVERY DAY BEFORE BREAKFAST for thyroid, Disp: 90 tablet, Rfl: 1   meclizine (ANTIVERT) 25 MG tablet, TAKE ONE TABLET BY MOUTH THREE TIMES DAILY AS NEEDED FOR DIZZINESS, Disp: 30 tablet, Rfl: 2   methocarbamol (ROBAXIN) 500 MG tablet, TAKE 1 TABLET BY MOUTH  EVERY 8 HOURS AS NEEDED FOR MUSCLE SPASMS., Disp: 40 tablet, Rfl: 0   Multiple Vitamin (MULTIVITAMIN WITH MINERALS) TABS tablet, Take 1 tablet by mouth daily., Disp: , Rfl:    nystatin cream (MYCOSTATIN), Apply 1 application topically 2 (two) times daily as needed (rash)., Disp: 30 g, Rfl: 2   traZODone (DESYREL) 50 MG tablet, TAKE 1-2 TABLETS (50-100 MG TOTAL) BY MOUTH AT BEDTIME AS NEEDED. 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  Tobacco Use   Smoking  status: Never   Smokeless tobacco: Never  Vaping Use   Vaping status: Never Used  Substance and Sexual Activity   Alcohol use: No    Alcohol/week: 0.0 standard drinks of alcohol   Drug use: No   Sexual activity: Not Currently  Other Topics Concern   Not on file  Social History Narrative   Not on file   Social Determinants of Health   Financial Resource Strain: Low Risk  (05/23/2023)   Overall Financial Resource Strain (CARDIA)    Difficulty of Paying Living Expenses: Not very hard  Food Insecurity: No Food Insecurity (05/23/2023)   Hunger Vital Sign    Worried About Running Out of Food in the Last Year: Never true    Ran Out of Food in the Last Year: Never true  Transportation Needs: No Transportation Needs (05/23/2023)   PRAPARE - Administrator, Civil Service (Medical): No    Lack of Transportation (Non-Medical): No  Physical Activity: Inactive (01/17/2023)   Exercise Vital Sign    Days of Exercise per Week: 0 days    Minutes of Exercise per Session: 0 min  Stress: No Stress Concern Present (01/17/2023)   [employer] of Occupational Health - Occupational Stress Questionnaire    Feeling of Stress : Only a little  Social Connections: Moderately Integrated (05/23/2023)   Social Connection and Isolation Panel [NHANES]    Frequency of Communication with Friends and Family: Once a week    Frequency of Social Gatherings with Friends and Family: Twice a week    Attends Religious Services: More than 4 times per year    Active Member of [employer] or Organizations: Yes    Attends Banker Meetings: 1 to 4 times per year    Marital Status: Widowed  Intimate Partner Violence: Not At Risk (02/02/2023)   Humiliation, Afraid, Rape, and Kick questionnaire    Fear of Current or Ex-Partner: No    Emotionally Abused: No    Physically Abused: No    Sexually Abused: No   Family History  Problem Relation Age of Onset   Breast cancer Mother        breast    Osteoporosis Mother    Heart disease Mother    Heart disease Father    Stroke Father    Coronary artery disease Brother        35s   Heart disease Brother    Esophageal varices Brother    Osteoporosis Maternal Aunt    Stroke Other        FAMILY   Esophageal varices Other        FAMILY   Colon cancer Neg Hx  Esophageal cancer Neg Hx    Pancreatic cancer Neg Hx    Stomach cancer Neg Hx     Objective: Office vital signs reviewed. BP 119/63 (BP Location: Left Arm, Patient Position: Standing, Cuff Size: Normal)   Pulse 64   Temp 98.8 F (37.1 C)   Ht 5\' 8"  (1.727 m)   Wt 182 lb 3.2 oz (82.6 kg)   SpO2 94%   BMI 27.70 kg/m   Physical Examination:  General: Awake, alert, well nourished, No acute distress HEENT: sclera white, MMM Cardio: regular rate and rhythm, S1S2 heard, no murmurs appreciated Pulm: clear to auscultation bilaterally, no wheezes, rhonchi or rales; normal work of breathing on room air Neuro: Has some gait instability and utilizing cane but otherwise no focal neurologic deficits MSK: Anterior tenderness palpation to the shoulder.  No palpable bony abnormalities.  Painful arc sign present Orthostatic Vitals for the past 48 hrs (Last 6 readings):  Patient Position BP Pulse BP Location Cuff Size  05/23/23 1500 Sitting (!) 151/62 64 Left Arm Normal  05/23/23 1514 Supine 126/73 -- Left Arm Normal  05/23/23 1515 Standing 119/63 -- Left Arm Normal     Assessment/ Plan: 88 y.o. female   Acquired hypothyroidism - Plan: TSH, T4, Free  Chronic right shoulder pain - Plan: Ambulatory referral to Orthopedic Surgery  Orthostatic hypotension  Spinal stenosis, lumbar region, with neurogenic claudication  History of colectomy  Check thyroid levels.  Clinically doing well with advanced dose of Synthroid  Referral to orthopedics for consideration of intra-articular injection  Orthostatic hypotension likely causing the dizziness.  We discussed adequate hydration,  changing positions slowly  Do think that balance issues are largely due to underlying low back issues.  Weight down about 6 pounds since colectomy.  Overall seems to be doing well.  Discussed adequate protein intake  Raliegh Ip, DO [facility] [phone]

## 2023-05-23 NOTE — Patient Instructions (Signed)
HYDRATE!!

## 2023-05-24 LAB — T4, FREE: Free T4: 1.45 ng/dL (ref 0.82–1.77)

## 2023-05-24 LAB — TSH: TSH: 3.55 u[IU]/mL (ref 0.450–4.500)

## 2023-05-31 ENCOUNTER — Ambulatory Visit: Payer: Medicare Other | Admitting: Orthopedic Surgery

## 2023-05-31 ENCOUNTER — Other Ambulatory Visit (INDEPENDENT_AMBULATORY_CARE_PROVIDER_SITE_OTHER): Payer: Medicare Other

## 2023-05-31 ENCOUNTER — Encounter: Payer: Self-pay | Admitting: Orthopedic Surgery

## 2023-05-31 VITALS — BP 146/68 | HR 86 | Ht 68.0 in | Wt 183.0 lb

## 2023-05-31 DIAGNOSIS — G8929 Other chronic pain: Secondary | ICD-10-CM

## 2023-05-31 DIAGNOSIS — M25511 Pain in right shoulder: Secondary | ICD-10-CM

## 2023-05-31 NOTE — Progress Notes (Signed)
New Patient Visit  Assessment: Brenda Solis is a 88 y.o. female with the following: 1. Chronic right shoulder pain  Plan: Brenda Solis has had pain in the right shoulder for several months.  No specific injury.  She has difficulty with overhead motion.  Radiographs are without obvious injury.  Minimal to no degenerative changes.  No evidence of a chronic rotator cuff injury.  On exam, she does have some pain with overhead motion, but her range of motion overall is pretty good.  She has pretty good strength.  She has not had a subacromial steroid injection, and I have recommended this in clinic today.  This was completed without issues.  She will follow-up as needed.  Procedure note injection - Right shoulder    Verbal consent was obtained to inject the right shoulder, subacromial space Timeout was completed to confirm the site of injection.   The skin was prepped with alcohol and ethyl chloride was sprayed at the injection site.  A 21-gauge needle was used to inject 40 mg of Depo-Medrol and 1% lidocaine (4 cc) into the subacromial space of the right shoulder using a posterolateral approach.  There were no complications.  A sterile bandage was applied.    Follow-up: Return if symptoms worsen or fail to improve.  Subjective:  Chief Complaint  Patient presents with   Shoulder Pain    R shoulder for awhile. Has received an injection within the last 3 mos.     History of Present Illness: Brenda Solis is a 88 y.o. female who has been referred by Doylene Canard, MD for evaluation of right shoulder pain.  She is right-hand dominant.  She has had pain in the right shoulder for several months.  No specific injury.  She is difficulty with overhead motion.  She has had multiple intramuscular steroid injections, with minimal improvement in her symptoms.  She is provided with hydrocodone for chronic back pain.  This helped her shoulder a little bit.  No prior injuries to the right shoulder.   The pain is most severe when she reaches behind her back.   Review of Systems: No fevers or chills No numbness or tingling No chest pain No shortness of breath No bowel or bladder dysfunction No GI distress No headaches   Medical History:  Past Medical History:  Diagnosis Date   Anemia    Anxiety    Cancer (HCC)    Depression    Heart murmur    Hyperlipidemia    Hypertension    Hypothyroidism    Insomnia, unspecified    Pneumonia    Spondylosis of unspecified site without mention of myelopathy    Thyroid disease    hypothyroidism    Past Surgical History:  Procedure Laterality Date   APPENDECTOMY     CATARACT EXTRACTION, BILATERAL Bilateral 2006   CERVICAL DISC SURGERY  1999   CESAREAN SECTION  1962 and 1967   CHOLECYSTECTOMY  1990   DECOMPRESSIVE LUMBAR LAMINECTOMY LEVEL 1 N/A 02/01/2013   Procedure: CENTRAL DECOMPRESSIVE LUMBAR LAMINECTOMY LEVEL 1 L4-L5;  Surgeon: Jacki Cones, MD;  Location: [facility];  Service: Orthopedics;  Laterality: N/A;   DECOMPRESSIVE LUMBAR LAMINECTOMY LEVEL 1 N/A 11/20/2014   Procedure: REVISION L4-S1 DECOMPRESSION, L4-L5 IN SITU FUSION (LEVEL 1);  Surgeon: Venita Lick, MD;  Location: [facility] OR;  Service: Orthopedics;  Laterality: N/A;   KNEE SURGERY Right 2009   meniscus repair   SPINAL CORD STIMULATOR INSERTION N/A 06/25/2020   Procedure: SPINAL  CORD STIMULATOR INSERTION;  Surgeon: Venita Lick, MD;  Location: [facility];  Service: Orthopedics;  Laterality: N/A;  3 hrs   TOTAL HIP ARTHROPLASTY  2011   RIGHT   TOTAL KNEE ARTHROPLASTY Right 08/24/2021   Procedure: TOTAL KNEE ARTHROPLASTY;  Surgeon: Ollen Gross, MD;  Location: [facility];  Service: Orthopedics;  Laterality: Right;    Family History  Problem Relation Age of Onset   Breast cancer Mother        breast   Osteoporosis Mother    Heart disease Mother    Heart disease Father    Stroke Father    Coronary artery disease Brother        70s   Heart disease Brother    Esophageal  varices Brother    Osteoporosis Maternal Aunt    Stroke Other        FAMILY   Esophageal varices Other        FAMILY   Colon cancer Neg Hx    Esophageal cancer Neg Hx    Pancreatic cancer Neg Hx    Stomach cancer Neg Hx    Social History   Tobacco Use   Smoking status: Never   Smokeless tobacco: Never  Vaping Use   Vaping status: Never Used  Substance Use Topics   Alcohol use: No    Alcohol/week: 0.0 standard drinks of alcohol   Drug use: No    Allergies  Allergen Reactions   Tape Other (See Comments)    Skin is very sensitive to adhesive tape.   Gabapentin Other (See Comments)    Dizzy   Oxycodone Itching    Current Meds  Medication Sig   Ascorbic Acid (VITAMIN C PO) Take 1 tablet by mouth daily.   buPROPion (WELLBUTRIN XL) 300 MG 24 hr tablet TAKE 1 TABLET (300 MG TOTAL) BY MOUTH DAILY. FOR DEPRESSION   busPIRone (BUSPAR) 5 MG tablet TAKE 1 TABLET BY MOUTH 2 TIMES DAILY AS NEEDED.   CALCIUM PO Take 1 tablet by mouth daily.   Cholecalciferol (VITAMIN D3 PO) Take 1 tablet by mouth daily.   diclofenac Sodium (VOLTAREN) 1 % GEL Apply 2 g topically 3 (three) times daily as needed (pain).   docusate sodium (COLACE) 100 MG capsule Take 100 mg by mouth daily.   DULoxetine (CYMBALTA) 60 MG capsule TAKE 1 CAPSULE (60 MG TOTAL) BY MOUTH DAILY. FOR DEPRESSION AND CHRONIC PAIN   ferrous sulfate 325 (65 FE) MG tablet TAKE 1 TAB IN THE MORNING AND 1 TAB AT BEDTIME. TAKE WITH VIT C OR A GLASS OF ORANGE JUICE   HYDROcodone-acetaminophen (NORCO/VICODIN) 5-325 MG tablet Take 1 tablet by mouth in the morning, at noon, and at bedtime.   levothyroxine (SYNTHROID) 88 MCG tablet TAKE 1 TABLET BY MOUTH EVERY DAY BEFORE BREAKFAST for thyroid   meclizine (ANTIVERT) 25 MG tablet TAKE ONE TABLET BY MOUTH THREE TIMES DAILY AS NEEDED FOR DIZZINESS   methocarbamol (ROBAXIN) 500 MG tablet TAKE 1 TABLET BY MOUTH EVERY 8 HOURS AS NEEDED FOR MUSCLE SPASMS.   Multiple Vitamin (MULTIVITAMIN WITH  MINERALS) TABS tablet Take 1 tablet by mouth daily.   nystatin cream (MYCOSTATIN) Apply 1 application topically 2 (two) times daily as needed (rash).   traZODone (DESYREL) 50 MG tablet TAKE 1-2 TABLETS (50-100 MG TOTAL) BY MOUTH AT BEDTIME AS NEEDED. FOR SLEEP    Objective: BP (!) 146/68   Pulse 86   Ht 5\' 8"  (1.727 m)   Wt 183 lb (83 kg)  BMI 27.83 kg/m   Physical Exam:  General: Alert and oriented. and No acute distress. Gait: Ambulates with the assistance of a walker.  Right shoulder without deformity.  No bruising.  Internal no redness.  110 degrees of forward flexion before it becomes too painful.  Internal rotation to her side.  External rotation is similar to the contralateral side.  Negative belly press.  IMAGING: I personally ordered and reviewed the following images  X-rays of the right shoulder were obtained in clinic today.  No acute injuries noted.  Well-positioned glenohumeral joint.  Well-maintained joint space.  Minimal osteophytes.  No evidence of proximal humeral migration.  No bony lesions.  Impression: Right shoulder x-rays without acute injury, minimal degenerative changes   New Medications:  No orders of the defined types were placed in this encounter.     Oliver Barre, MD  05/31/2023 2:30 PM

## 2023-06-01 ENCOUNTER — Other Ambulatory Visit: Payer: Medicare Other

## 2023-06-01 DIAGNOSIS — C182 Malignant neoplasm of ascending colon: Secondary | ICD-10-CM | POA: Diagnosis not present

## 2023-06-03 ENCOUNTER — Other Ambulatory Visit: Payer: Self-pay | Admitting: Family Medicine

## 2023-06-03 DIAGNOSIS — F32A Depression, unspecified: Secondary | ICD-10-CM

## 2023-06-16 ENCOUNTER — Telehealth: Payer: Medicare Other | Admitting: Family

## 2023-06-16 ENCOUNTER — Other Ambulatory Visit: Payer: Self-pay | Admitting: Family Medicine

## 2023-06-16 ENCOUNTER — Encounter: Payer: Self-pay | Admitting: Family

## 2023-06-16 DIAGNOSIS — J208 Acute bronchitis due to other specified organisms: Secondary | ICD-10-CM

## 2023-06-16 DIAGNOSIS — B9689 Other specified bacterial agents as the cause of diseases classified elsewhere: Secondary | ICD-10-CM | POA: Diagnosis not present

## 2023-06-16 DIAGNOSIS — E039 Hypothyroidism, unspecified: Secondary | ICD-10-CM

## 2023-06-16 MED ORDER — CETIRIZINE HCL 10 MG PO TABS
10.0000 mg | ORAL_TABLET | Freq: Every day | ORAL | 1 refills | Status: DC
Start: 2023-06-16 — End: 2024-03-14

## 2023-06-16 MED ORDER — BENZONATATE 200 MG PO CAPS
200.0000 mg | ORAL_CAPSULE | Freq: Three times a day (TID) | ORAL | 1 refills | Status: DC | PRN
Start: 2023-06-16 — End: 2024-03-14

## 2023-06-16 MED ORDER — FLUTICASONE PROPIONATE 50 MCG/ACT NA SUSP: 2.0000 | Freq: Every day | NASAL | 6 refills | Status: DC

## 2023-06-16 MED ORDER — AZITHROMYCIN 250 MG PO TABS
ORAL_TABLET | ORAL | 0 refills | Status: DC
Start: 2023-06-16 — End: 2024-03-02

## 2023-06-16 NOTE — Progress Notes (Signed)
Virtual Visit Consent   Brenda Solis, you are scheduled for a virtual visit with a [facility] provider today. Just as with appointments in the office, your consent must be obtained to participate. Your consent will be active for this visit and any virtual visit you may have with one of our providers in the next 365 days. If you have a MyChart account, a copy of this consent can be sent to you electronically.  As this is a virtual visit, video technology does not allow for your provider to perform a traditional examination. This may limit your provider's ability to fully assess your condition. If your provider identifies any concerns that need to be evaluated in person or the need to arrange testing (such as labs, EKG, etc.), we will make arrangements to do so. Although advances in technology are sophisticated, we cannot ensure that it will always work on either your end or our end. If the connection with a video visit is poor, the visit may have to be switched to a telephone visit. With either a video or telephone visit, we are not always able to ensure that we have a secure connection.  By engaging in this virtual visit, you consent to the provision of healthcare and authorize for your insurance to be billed (if applicable) for the services provided during this visit. Depending on your insurance coverage, you may receive a charge related to this service.  I need to obtain your verbal consent now. Are you willing to proceed with your visit today? Brenda Solis has provided verbal consent on 06/16/2023 for a virtual visit (video or telephone). Jannifer Rodney, FNP  Date: 06/16/2023 3:33 PM  Virtual Visit via Video Note   I, Jannifer Rodney, connected with  Brenda Solis  ([id number], 31-Oct-1934) on 06/16/23 at  3:10 PM EST by a video-enabled telemedicine application and verified that I am speaking with the correct person using two identifiers.  Location: Patient: Virtual Visit Location Patient:  Home Provider: Virtual Visit Location Provider: Home Office   I discussed the limitations of evaluation and management by telemedicine and the availability of in person appointments. The patient expressed understanding and agreed to proceed.    History of Present Illness: Brenda Solis is a 88 y.o. who identifies as a female who was assigned female at birth, and is being seen today for cough.  HPI: Cough This is a new problem. The current episode started in the past 7 days. The problem has been gradually worsening. The problem occurs every few minutes. Associated symptoms include rhinorrhea and shortness of breath. Pertinent negatives include no chills, ear congestion, ear pain, fever, headaches, myalgias, nasal congestion, postnasal drip or wheezing. She has tried rest for the symptoms. The treatment provided mild relief.    Problems:  Patient Active Problem List   Diagnosis Date Noted   Colon cancer (HCC) 02/02/2023   Primary osteoarthritis of right knee 08/24/2021   Cough 07/06/2021   Osteoarthritis of right knee 05/18/2021   Chronic pain 06/25/2020   Obsessional thoughts    Mood disorder (HCC)    Osteopenia 04/24/2014   Hyperlipidemia with target LDL less than 100 02/01/2014   Depression 02/01/2014   Spinal stenosis, lumbar region, with neurogenic claudication 02/01/2013   Hypertension 11/17/2010   Hypothyroidism 10/14/2010   Insomnia 10/14/2010   Gastritis 10/14/2010    Allergies:  Allergies  Allergen Reactions   Tape Other (See Comments)    Skin is very sensitive to adhesive tape.  Gabapentin Other (See Comments)    Dizzy   Oxycodone Itching   Medications:  Current Outpatient Medications:    azithromycin (ZITHROMAX) 250 MG tablet, Take 500 mg once, then 250 mg for four days, Disp: 6 tablet, Rfl: 0   benzonatate (TESSALON) 200 MG capsule, Take 1 capsule (200 mg total) by mouth 3 (three) times daily as needed., Disp: 30 capsule, Rfl: 1   cetirizine (ZYRTEC ALLERGY) 10  MG tablet, Take 1 tablet (10 mg total) by mouth daily., Disp: 90 tablet, Rfl: 1   fluticasone (FLONASE) 50 MCG/ACT nasal spray, Place 2 sprays into both nostrils daily., Disp: 16 g, Rfl: 6   Ascorbic Acid (VITAMIN C PO), Take 1 tablet by mouth daily., Disp: , Rfl:    buPROPion (WELLBUTRIN XL) 300 MG 24 hr tablet, TAKE 1 TABLET (300 MG TOTAL) BY MOUTH DAILY. FOR DEPRESSION, Disp: 90 tablet, Rfl: 1   busPIRone (BUSPAR) 5 MG tablet, TAKE 1 TABLET BY MOUTH 2 TIMES DAILY AS NEEDED., Disp: 180 tablet, Rfl: 3   CALCIUM PO, Take 1 tablet by mouth daily., Disp: , Rfl:    Cholecalciferol (VITAMIN D3 PO), Take 1 tablet by mouth daily., Disp: , Rfl:    diclofenac Sodium (VOLTAREN) 1 % GEL, Apply 2 g topically 3 (three) times daily as needed (pain)., Disp: 200 g, Rfl: PRN   docusate sodium (COLACE) 100 MG capsule, Take 100 mg by mouth daily., Disp: , Rfl:    DULoxetine (CYMBALTA) 60 MG capsule, TAKE 1 CAPSULE (60 MG TOTAL) BY MOUTH DAILY. FOR DEPRESSION AND CHRONIC PAIN, Disp: 90 capsule, Rfl: 1   ferrous sulfate 325 (65 FE) MG tablet, TAKE 1 TAB IN THE MORNING AND 1 TAB AT BEDTIME. TAKE WITH VIT C OR A GLASS OF ORANGE JUICE, Disp: 180 tablet, Rfl: 0   HYDROcodone-acetaminophen (NORCO/VICODIN) 5-325 MG tablet, Take 1 tablet by mouth in the morning, at noon, and at bedtime., Disp: , Rfl:    levothyroxine (SYNTHROID) 88 MCG tablet, TAKE 1 TABLET BY MOUTH EVERY DAY BEFORE BREAKFAST for thyroid, Disp: 90 tablet, Rfl: 1   meclizine (ANTIVERT) 25 MG tablet, TAKE ONE TABLET BY MOUTH THREE TIMES DAILY AS NEEDED FOR DIZZINESS, Disp: 30 tablet, Rfl: 2   methocarbamol (ROBAXIN) 500 MG tablet, TAKE 1 TABLET BY MOUTH EVERY 8 HOURS AS NEEDED FOR MUSCLE SPASMS., Disp: 40 tablet, Rfl: 0   Multiple Vitamin (MULTIVITAMIN WITH MINERALS) TABS tablet, Take 1 tablet by mouth daily., Disp: , Rfl:    nystatin cream (MYCOSTATIN), Apply 1 application topically 2 (two) times daily as needed (rash)., Disp: 30 g, Rfl: 2   traZODone  (DESYREL) 50 MG tablet, TAKE 1-2 TABLETS (50-100 MG TOTAL) BY MOUTH AT BEDTIME AS NEEDED. FOR SLEEP, Disp: 180 tablet, Rfl: 2  Observations/Objective: Patient is well-developed, well-nourished in no acute distress.  Resting comfortably  at home.  Head is normocephalic, atraumatic.  No labored breathing.  Speech is clear and coherent with logical content.  Patient is alert and oriented at baseline.  Coarse productive cough  Assessment and Plan: 1. Acute bacterial bronchitis (Primary) - fluticasone (FLONASE) 50 MCG/ACT nasal spray; Place 2 sprays into both nostrils daily.  Dispense: 16 g; Refill: 6 - cetirizine (ZYRTEC ALLERGY) 10 MG tablet; Take 1 tablet (10 mg total) by mouth daily.  Dispense: 90 tablet; Refill: 1 - benzonatate (TESSALON) 200 MG capsule; Take 1 capsule (200 mg total) by mouth 3 (three) times daily as needed.  Dispense: 30 capsule; Refill: 1 - azithromycin (ZITHROMAX) 250 MG  tablet; Take 500 mg once, then 250 mg for four days  Dispense: 6 tablet; Refill: 0  - Take meds as prescribed - Use a cool mist humidifier  -Use saline nose sprays frequently -Force fluids -For any cough or congestion  Use plain Mucinex- regular strength or max strength is fine -For fever or aces or pains- take tylenol or ibuprofen. -Throat lozenges if help -Follow up if symptoms worsen or do not improve   Follow Up Instructions: I discussed the assessment and treatment plan with the patient. The patient was provided an opportunity to ask questions and all were answered. The patient agreed with the plan and demonstrated an understanding of the instructions.  A copy of instructions were sent to the patient via MyChart unless otherwise noted below.     The patient was advised to call back or seek an in-person evaluation if the symptoms worsen or if the condition fails to improve as anticipated.    Jannifer Rodney, FNP

## 2023-06-18 ENCOUNTER — Other Ambulatory Visit: Payer: Self-pay | Admitting: Family Medicine

## 2023-08-11 DIAGNOSIS — Z5181 Encounter for therapeutic drug level monitoring: Secondary | ICD-10-CM | POA: Diagnosis not present

## 2023-08-11 DIAGNOSIS — M961 Postlaminectomy syndrome, not elsewhere classified: Secondary | ICD-10-CM | POA: Diagnosis not present

## 2023-08-11 DIAGNOSIS — Z79899 Other long term (current) drug therapy: Secondary | ICD-10-CM | POA: Diagnosis not present

## 2023-08-11 DIAGNOSIS — M48062 Spinal stenosis, lumbar region with neurogenic claudication: Secondary | ICD-10-CM | POA: Diagnosis not present

## 2023-08-11 DIAGNOSIS — M51369 Other intervertebral disc degeneration, lumbar region without mention of lumbar back pain or lower extremity pain: Secondary | ICD-10-CM | POA: Diagnosis not present

## 2023-08-14 ENCOUNTER — Other Ambulatory Visit: Payer: Self-pay | Admitting: Family Medicine

## 2023-08-14 DIAGNOSIS — M48062 Spinal stenosis, lumbar region with neurogenic claudication: Secondary | ICD-10-CM

## 2023-08-14 DIAGNOSIS — F419 Anxiety disorder, unspecified: Secondary | ICD-10-CM

## 2023-08-14 DIAGNOSIS — F32A Depression, unspecified: Secondary | ICD-10-CM

## 2023-09-05 ENCOUNTER — Ambulatory Visit (INDEPENDENT_AMBULATORY_CARE_PROVIDER_SITE_OTHER): Payer: Medicare Other

## 2023-09-05 VITALS — Ht 68.0 in | Wt 183.0 lb

## 2023-09-05 DIAGNOSIS — Z0000 Encounter for general adult medical examination without abnormal findings: Secondary | ICD-10-CM

## 2023-09-05 NOTE — Progress Notes (Signed)
 Subjective:   Brenda Solis is a 89 y.o. who presents for a Medicare Wellness preventive visit.  Visit Complete: Virtual I connected with  Brenda Solis on 09/05/23 by a audio enabled telemedicine application and verified that I am speaking with the correct person using two identifiers.  Patient Location: Home  Provider Location: Home Office  I discussed the limitations of evaluation and management by telemedicine. The patient expressed understanding and agreed to proceed.  Vital Signs: Because this visit was a virtual/telehealth visit, some criteria may be missing or patient reported. Any vitals not documented were not able to be obtained and vitals that have been documented are patient reported.  VideoDeclined- This patient declined Librarian, academic. Therefore the visit was completed with audio only.  AWV Questionnaire: No: Patient Medicare AWV questionnaire was not completed prior to this visit.  Cardiac Risk Factors include: advanced age (>40men, >92 women);hypertension;dyslipidemia     Objective:    Today's Vitals   09/05/23 0930  Weight: 183 lb (83 kg)  Height: 5\' 8"  (1.727 m)   Body mass index is 27.83 kg/m.     09/05/2023   10:35 AM 02/02/2023   12:38 PM 01/27/2023    8:45 AM 09/02/2022    9:24 AM 08/31/2021    9:08 AM 08/24/2021    1:40 PM 08/11/2021    2:04 PM  Advanced Directives  Does Patient Have a Medical Advance Directive? No No No No No No No  Would patient like information on creating a medical advance directive? Yes (MAU/Ambulatory/Procedural Areas - Information given) No - Patient declined  Yes (MAU/Ambulatory/Procedural Areas - Information given) No - Patient declined No - Guardian declined No - Patient declined    Current Medications (verified) Outpatient Encounter Medications as of 09/05/2023  Medication Sig   Ascorbic Acid (VITAMIN C PO) Take 1 tablet by mouth daily.   buPROPion (WELLBUTRIN XL) 300 MG 24 hr tablet TAKE 1  TABLET (300 MG TOTAL) BY MOUTH DAILY. FOR DEPRESSION   busPIRone (BUSPAR) 5 MG tablet TAKE 1 TABLET BY MOUTH 2 TIMES DAILY AS NEEDED.   CALCIUM PO Take 1 tablet by mouth daily.   cetirizine (ZYRTEC ALLERGY) 10 MG tablet Take 1 tablet (10 mg total) by mouth daily.   Cholecalciferol (VITAMIN D3 PO) Take 1 tablet by mouth daily.   diclofenac Sodium (VOLTAREN) 1 % GEL Apply 2 g topically 3 (three) times daily as needed (pain).   docusate sodium (COLACE) 100 MG capsule Take 100 mg by mouth daily.   DULoxetine (CYMBALTA) 60 MG capsule TAKE 1 CAPSULE (60 MG TOTAL) BY MOUTH DAILY. FOR DEPRESSION AND CHRONIC PAIN   ferrous sulfate 325 (65 FE) MG tablet TAKE 1 TAB IN THE MORNING AND 1 TAB AT BEDTIME. TAKE WITH VIT C OR A GLASS OF ORANGE JUICE   fluticasone (FLONASE) 50 MCG/ACT nasal spray Place 2 sprays into both nostrils daily.   HYDROcodone-acetaminophen (NORCO/VICODIN) 5-325 MG tablet Take 1 tablet by mouth in the morning, at noon, and at bedtime.   levothyroxine (SYNTHROID) 88 MCG tablet TAKE 1 TABLET BY MOUTH EVERY DAY BEFORE BREAKFAST FOR THYROID   meclizine (ANTIVERT) 25 MG tablet TAKE ONE TABLET BY MOUTH THREE TIMES DAILY AS NEEDED FOR DIZZINESS   methocarbamol (ROBAXIN) 500 MG tablet TAKE 1 TABLET BY MOUTH EVERY 8 HOURS AS NEEDED FOR MUSCLE SPASMS.   Multiple Vitamin (MULTIVITAMIN WITH MINERALS) TABS tablet Take 1 tablet by mouth daily.   nystatin cream (MYCOSTATIN) Apply 1 application  topically 2 (two) times daily as needed (rash).   traZODone (DESYREL) 50 MG tablet TAKE 1-2 TABLETS (50-100 MG TOTAL) BY MOUTH AT BEDTIME AS NEEDED. FOR SLEEP   azithromycin (ZITHROMAX) 250 MG tablet Take 500 mg once, then 250 mg for four days (Patient not taking: Reported on 09/05/2023)   benzonatate (TESSALON) 200 MG capsule Take 1 capsule (200 mg total) by mouth 3 (three) times daily as needed. (Patient not taking: Reported on 09/05/2023)   No facility-administered encounter medications on file as of 09/05/2023.     Allergies (verified) Tape, Gabapentin, and Oxycodone   History: Past Medical History:  Diagnosis Date   Anemia    Anxiety    Cancer (HCC)    Depression    Heart murmur    Hyperlipidemia    Hypertension    Hypothyroidism    Insomnia, unspecified    Pneumonia    Spondylosis of unspecified site without mention of myelopathy    Thyroid disease    hypothyroidism   Past Surgical History:  Procedure Laterality Date   APPENDECTOMY     CATARACT EXTRACTION, BILATERAL Bilateral 2006   CERVICAL DISC SURGERY  1999   CESAREAN SECTION  1962 and 1967   CHOLECYSTECTOMY  1990   DECOMPRESSIVE LUMBAR LAMINECTOMY LEVEL 1 N/A 02/01/2013   Procedure: CENTRAL DECOMPRESSIVE LUMBAR LAMINECTOMY LEVEL 1 L4-L5;  Surgeon: Jacki Cones, MD;  Location: [facility];  Service: Orthopedics;  Laterality: N/A;   DECOMPRESSIVE LUMBAR LAMINECTOMY LEVEL 1 N/A 11/20/2014   Procedure: REVISION L4-S1 DECOMPRESSION, L4-L5 IN SITU FUSION (LEVEL 1);  Surgeon: Venita Lick, MD;  Location: [facility];  Service: Orthopedics;  Laterality: N/A;   KNEE SURGERY Right 2009   meniscus repair   SPINAL CORD STIMULATOR INSERTION N/A 06/25/2020   Procedure: SPINAL CORD STIMULATOR INSERTION;  Surgeon: Venita Lick, MD;  Location: [facility];  Service: Orthopedics;  Laterality: N/A;  3 hrs   TOTAL HIP ARTHROPLASTY  2011   RIGHT   TOTAL KNEE ARTHROPLASTY Right 08/24/2021   Procedure: TOTAL KNEE ARTHROPLASTY;  Surgeon: Ollen Gross, MD;  Location: [facility];  Service: Orthopedics;  Laterality: Right;   Family History  Problem Relation Age of Onset   Breast cancer Mother        breast   Osteoporosis Mother    Heart disease Mother    Heart disease Father    Stroke Father    Coronary artery disease Brother        37s   Heart disease Brother    Esophageal varices Brother    Osteoporosis Maternal Aunt    Stroke Other        FAMILY   Esophageal varices Other        FAMILY   Colon cancer Neg Hx    Esophageal cancer Neg Hx     Pancreatic cancer Neg Hx    Stomach cancer Neg Hx    Social History   Socioeconomic History   Marital status: Widowed    Spouse name: Not on file   Number of children: 3   Years of education: Not on file   Highest education level: 12th grade  Occupational History    Employer: RETIRED    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  Tobacco Use   Smoking status: Never   Smokeless tobacco: Never  Vaping Use   Vaping status: Never Used  Substance and Sexual Activity   Alcohol use: No    Alcohol/week: 0.0 standard drinks of alcohol   Drug use: No  Sexual activity: Not Currently  Other Topics Concern   Not on file  Social History Narrative   Not on file   Social Drivers of Health   Financial Resource Strain: Low Risk  (09/05/2023)   Overall Financial Resource Strain (CARDIA)    Difficulty of Paying Living Expenses: Not very hard  Food Insecurity: No Food Insecurity (09/05/2023)   Hunger Vital Sign    Worried About Running Out of Food in the Last Year: Never true    Ran Out of Food in the Last Year: Never true  Transportation Needs: No Transportation Needs (09/05/2023)   PRAPARE - Administrator, Civil Service (Medical): No    Lack of Transportation (Non-Medical): No  Physical Activity: Inactive (09/05/2023)   Exercise Vital Sign    Days of Exercise per Week: 0 days    Minutes of Exercise per Session: 0 min  Stress: No Stress Concern Present (09/05/2023)   [employer] of Occupational Health - Occupational Stress Questionnaire    Feeling of Stress : Not at all  Social Connections: Moderately Integrated (09/05/2023)   Social Connection and Isolation Panel [NHANES]    Frequency of Communication with Friends and Family: Once a week    Frequency of Social Gatherings with Friends and Family: Twice a week    Attends Religious Services: More than 4 times per year    Active Member of [employer] or Organizations: Yes    Attends Banker Meetings: 1 to 4 times per year     Marital Status: Widowed    Tobacco Counseling Counseling given: Not Answered    Clinical Intake:  Pre-visit preparation completed: Yes  Pain : No/denies pain     Diabetes: No  How often do you need to have someone help you when you read instructions, pamphlets, or other written materials from your doctor or pharmacy?: 1 - Never  Interpreter Needed?: No  Information entered by :: Kandis Fantasia LPN   Activities of Daily Living     09/05/2023   10:35 AM 02/02/2023    6:00 PM  In your present state of health, do you have any difficulty performing the following activities:  Hearing? 0 0  Vision? 0 0  Difficulty concentrating or making decisions? 0 0  Walking or climbing stairs? 1 0  Dressing or bathing? 0 0  Doing errands, shopping? 0 0  Preparing Food and eating ? N   Using the Toilet? N   In the past six months, have you accidently leaked urine? N   Do you have problems with loss of bowel control? N   Managing your Medications? N   Managing your Finances? N   Housekeeping or managing your Housekeeping? N     Patient Care Team: Raliegh Ip, DO as PCP - General (Family Medicine) Ollen Gross, MD as Consulting Physician (Orthopedic Surgery) Delora Fuel, [place] (Optometry) Hilarie Fredrickson, MD as Consulting Physician (Gastroenterology) Romie Levee, MD as Consulting Physician (General Surgery)  Indicate any recent Medical Services you may have received from other than [facility] providers in the past year (date may be approximate).     Assessment:   This is a routine wellness examination for Brenda Solis.  Hearing/Vision screen Hearing Screening - Comments:: Denies hearing difficulties   Vision Screening - Comments:: Wears rx glasses - up to date with routine eye exams with [facility]. Wyn Forster     Goals Addressed             This Visit's Progress  COMPLETED: Patient Stated       08/21/2019 AWV Goal: Fall Prevention  Over the next year, patient will decrease  their risk for falls by: Using assistive devices, such as a cane or walker, as needed Identifying fall risks within their home and correcting them by: Removing throw rugs Adding handrails to stairs or ramps Removing clutter and keeping a clear pathway throughout the home Increasing light, especially at night Adding shower handles/bars Raising toilet seat Identifying potential personal risk factors for falls: Medication side effects Incontinence/urgency Vestibular dysfunction Hearing loss Musculoskeletal disorders Neurological disorders Orthostatic hypotension         Depression Screen     09/05/2023   10:31 AM 05/23/2023    3:01 PM 03/08/2023    3:20 PM 11/09/2022    3:29 PM 09/02/2022    9:23 AM 08/09/2022    3:27 PM 05/19/2022    1:56 PM  PHQ 2/9 Scores  PHQ - 2 Score 1 1 0 0 0 0   PHQ- 9 Score 6 7 0 0 0 0   Exception Documentation       Patient refusal    Fall Risk     09/05/2023   10:35 AM 05/23/2023    3:04 PM 05/23/2023    3:01 PM 03/08/2023    3:20 PM 11/09/2022    3:29 PM  Fall Risk   Falls in the past year? 0 1 0 0 0  Number falls in past yr: 0 0 0 0 0  Injury with Fall? 0 0 0 0 0  Risk for fall due to : No Fall Risks Impaired mobility No Fall Risks No Fall Risks No Fall Risks  Follow up Falls prevention discussed;Education provided;Falls evaluation completed Education provided Education provided Education provided Education provided    MEDICARE RISK AT HOME:  Medicare Risk at Home Any stairs in or around the home?: No If so, are there any without handrails?: No Home free of loose throw rugs in walkways, pet beds, electrical cords, etc?: Yes Adequate lighting in your home to reduce risk of falls?: Yes Life alert?: No Use of a cane, walker or w/c?: Yes Grab bars in the bathroom?: Yes Shower chair or bench in shower?: No Elevated toilet seat or a handicapped toilet?: Yes  TIMED UP AND GO:  Was the test performed?  No  Cognitive Function: 6CIT  completed    03/03/2018    3:27 PM 10/17/2014    8:42 AM  MMSE - Mini Mental State Exam  Orientation to time 5 5  Orientation to Place  5  Registration 3 3  Attention/ Calculation 4 5  Recall 2 3  Language- name 2 objects 2 2  Language- repeat 1 1  Language- follow 3 step command 3 3  Language- read & follow direction 1 1  Write a sentence 1 1  Copy design 0 1  Total score  30        09/05/2023   10:35 AM 09/02/2022    9:25 AM 08/29/2020    8:24 AM 08/21/2019    2:45 PM  6CIT Screen  What Year? 0 points 0 points 0 points 0 points  What month? 0 points 0 points 0 points 0 points  What time? 0 points 0 points 0 points 0 points  Count back from 20 0 points 0 points 0 points 0 points  Months in reverse 0 points 0 points 0 points 2 points  Repeat phrase 0 points 0 points 0 points 2 points  Total Score 0 points 0 points 0 points 4 points    Immunizations Immunization History  Administered Date(s) Administered   Fluad Quad(high Dose 65+) 05/04/2019, 05/27/2020, 04/20/2021   Fluad Trivalent(High Dose 65+) 03/08/2023   Influenza, High Dose Seasonal PF 05/06/2016, 05/06/2016, 04/14/2017, 04/14/2017, 03/30/2018, 03/30/2018   Influenza,inj,Quad PF,6+ Mos 03/28/2013, 04/24/2014, 06/20/2015   Moderna Sars-Covid-2 Vaccination 08/07/2019, 09/04/2019, 07/09/2020   Pneumococcal Conjugate-13 10/16/2014   Pneumococcal Polysaccharide-23 05/28/2006   Td 06/29/1995, 05/26/2011   Td (Adult), 2 Lf Tetanus Toxid, Preservative Free 06/29/1995, 05/26/2011   Tdap 05/26/2011, 05/23/2023   Zoster Recombinant(Shingrix) 05/05/2022, 05/23/2023    Screening Tests Health Maintenance  Topic Date Due   COVID-19 Vaccine (4 - 2024-25 season) 02/27/2023   Colonoscopy  01/12/2024   Medicare Annual Wellness (AWV)  09/04/2024   DEXA SCAN  11/09/2024   DTaP/Tdap/Td (6 - Td or Tdap) 05/22/2033   Pneumonia Vaccine 73+ Years old  Completed   INFLUENZA VACCINE  Completed   Zoster Vaccines- Shingrix  Completed    HPV VACCINES  Aged Out    Health Maintenance  Health Maintenance Due  Topic Date Due   COVID-19 Vaccine (4 - 2024-25 season) 02/27/2023    Additional Screening:  Vision Screening: Recommended annual ophthalmology exams for early detection of glaucoma and other disorders of the eye.  Dental Screening: Recommended annual dental exams for proper oral hygiene  Community Resource Referral / Chronic Care Management: CRR required this visit?  No   CCM required this visit?  No     Plan:     I have personally reviewed and noted the following in the patient's chart:   Medical and social history Use of alcohol, tobacco or illicit drugs  Current medications and supplements including opioid prescriptions. Patient is currently taking opioid prescriptions. Information provided to patient regarding non-opioid alternatives. Patient advised to discuss non-opioid treatment plan with their provider.Functional ability and status Nutritional status Physical activity Advanced directives List of other physicians Hospitalizations, surgeries, and ER visits in previous 12 months Vitals Screenings to include cognitive, depression, and falls Referrals and appointments  In addition, I have reviewed and discussed with patient certain preventive protocols, quality metrics, and best practice recommendations. A written personalized care plan for preventive services as well as general preventive health recommendations were provided to patient.     Kandis Fantasia [place], [place]   [id number]   After Visit Summary: (MyChart) Due to this being a telephonic visit, the after visit summary with patients personalized plan was offered to patient via MyChart   Notes: Nothing significant to report at this time.

## 2023-09-05 NOTE — Patient Instructions (Signed)
 Ms. Fonseca , Thank you for taking time to come for your Medicare Wellness Visit. I appreciate your ongoing commitment to your health goals. Please review the following plan we discussed and let me know if I can assist you in the future.   Referrals/Orders/Follow-Ups/Clinician Recommendations: Aim for 30 minutes of exercise or brisk walking, 6-8 glasses of water, and 5 servings of fruits and vegetables each day.  This is a list of the screening recommended for you and due dates:  Health Maintenance  Topic Date Due   COVID-19 Vaccine (4 - 2024-25 season) 02/27/2023   Colon Cancer Screening  01/12/2024   Medicare Annual Wellness Visit  09/04/2024   DEXA scan (bone density measurement)  11/09/2024   DTaP/Tdap/Td vaccine (6 - Td or Tdap) 05/22/2033   Pneumonia Vaccine  Completed   Flu Shot  Completed   Zoster (Shingles) Vaccine  Completed   HPV Vaccine  Aged Out    Advanced directives: (ACP Link)Information on Advanced Care Planning can be found at [facility] Advance Health Care Directives Advance Health Care Directives. [email or web address]   Next Medicare Annual Wellness Visit scheduled for next year: Yes

## 2023-09-09 ENCOUNTER — Other Ambulatory Visit: Payer: Self-pay | Admitting: Family Medicine

## 2023-09-13 ENCOUNTER — Other Ambulatory Visit: Payer: Self-pay | Admitting: Family Medicine

## 2023-09-13 DIAGNOSIS — F419 Anxiety disorder, unspecified: Secondary | ICD-10-CM

## 2023-09-15 ENCOUNTER — Other Ambulatory Visit: Payer: Self-pay | Admitting: Family Medicine

## 2023-09-15 DIAGNOSIS — E039 Hypothyroidism, unspecified: Secondary | ICD-10-CM

## 2023-09-19 DIAGNOSIS — C182 Malignant neoplasm of ascending colon: Secondary | ICD-10-CM | POA: Diagnosis not present

## 2023-10-10 ENCOUNTER — Other Ambulatory Visit: Payer: Self-pay | Admitting: Family Medicine

## 2023-10-10 DIAGNOSIS — F32A Depression, unspecified: Secondary | ICD-10-CM

## 2023-11-22 NOTE — Progress Notes (Unsigned)
 Brenda Solis is a 89 y.o. female presents to office today for annual physical exam examination.    Concerns today include: 1. ***  Occupation: ***, Marital status: ***, Substance use: *** Health Maintenance Due  Topic Date Due   COVID-19 Vaccine (4 - 2024-25 season) 02/27/2023   Colonoscopy  01/12/2024   Refills needed today: ***  Immunization History  Administered Date(s) Administered   Fluad Quad(high Dose 65+) 05/04/2019, 05/27/2020, 04/20/2021   Fluad Trivalent(High Dose 65+) 03/08/2023   Influenza, High Dose Seasonal PF 05/06/2016, 05/06/2016, 04/14/2017, 04/14/2017, 03/30/2018, 03/30/2018   Influenza,inj,Quad PF,6+ Mos 03/28/2013, 04/24/2014, 06/20/2015   Moderna Sars-Covid-2 Vaccination 08/07/2019, 09/04/2019, 07/09/2020   Pneumococcal Conjugate-13 10/16/2014   Pneumococcal Polysaccharide-23 05/28/2006   Td 06/29/1995, 05/26/2011   Td (Adult), 2 Lf Tetanus Toxid, Preservative Free 06/29/1995, 05/26/2011   Tdap 05/26/2011, 05/23/2023   Zoster Recombinant(Shingrix ) 05/05/2022, 05/23/2023   Past Medical History:  Diagnosis Date   Anemia    Anxiety    Cancer (HCC)    Depression    Heart murmur    Hyperlipidemia    Hypertension    Hypothyroidism    Insomnia, unspecified    Pneumonia    Spondylosis of unspecified site without mention of myelopathy    Thyroid  disease    hypothyroidism   Social History   Socioeconomic History   Marital status: Widowed    Spouse name: Not on file   Number of children: 3   Years of education: Not on file   Highest education level: 12th grade  Occupational History    Employer: RETIRED    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  Tobacco Use   Smoking status: Never   Smokeless tobacco: Never  Vaping Use   Vaping status: Never Used  Substance and Sexual Activity   Alcohol use: No    Alcohol/week: 0.0 standard drinks of alcohol   Drug use: No   Sexual activity: Not Currently  Other Topics Concern   Not on file  Social History  Narrative   Not on file   Social Drivers of Health   Financial Resource Strain: Low Risk  (09/05/2023)   Overall Financial Resource Strain (CARDIA)    Difficulty of Paying Living Expenses: Not very hard  Food Insecurity: No Food Insecurity (09/05/2023)   Hunger Vital Sign    Worried About Running Out of Food in the Last Year: Never true    Ran Out of Food in the Last Year: Never true  Transportation Needs: No Transportation Needs (09/05/2023)   PRAPARE - Administrator, Civil Service (Medical): No    Lack of Transportation (Non-Medical): No  Physical Activity: Inactive (09/05/2023)   Exercise Vital Sign    Days of Exercise per Week: 0 days    Minutes of Exercise per Session: 0 min  Stress: No Stress Concern Present (09/05/2023)   [employer] of Occupational Health - Occupational Stress Questionnaire    Feeling of Stress : Not at all  Social Connections: Moderately Integrated (09/05/2023)   Social Connection and Isolation Panel [NHANES]    Frequency of Communication with Friends and Family: Once a week    Frequency of Social Gatherings with Friends and Family: Twice a week    Attends Religious Services: More than 4 times per year    Active Member of [employer] or Organizations: Yes    Attends Banker Meetings: 1 to 4 times per year    Marital Status: Widowed  Intimate Partner Violence: Not At Risk (  09/05/2023)   Humiliation, Afraid, Rape, and Kick questionnaire    Fear of Current or Ex-Partner: No    Emotionally Abused: No    Physically Abused: No    Sexually Abused: No   Past Surgical History:  Procedure Laterality Date   APPENDECTOMY     CATARACT EXTRACTION, BILATERAL Bilateral 2006   CERVICAL DISC SURGERY  1999   CESAREAN SECTION  1962 and 1967   CHOLECYSTECTOMY  1990   DECOMPRESSIVE LUMBAR LAMINECTOMY LEVEL 1 N/A 02/01/2013   Procedure: CENTRAL DECOMPRESSIVE LUMBAR LAMINECTOMY LEVEL 1 L4-L5;  Surgeon: Florencia Hunter, MD;  Location: [facility];  Service:  Orthopedics;  Laterality: N/A;   DECOMPRESSIVE LUMBAR LAMINECTOMY LEVEL 1 N/A 11/20/2014   Procedure: REVISION L4-S1 DECOMPRESSION, L4-L5 IN SITU FUSION (LEVEL 1);  Surgeon: Mort Ards, MD;  Location: [facility];  Service: Orthopedics;  Laterality: N/A;   KNEE SURGERY Right 2009   meniscus repair   SPINAL CORD STIMULATOR INSERTION N/A 06/25/2020   Procedure: SPINAL CORD STIMULATOR INSERTION;  Surgeon: Mort Ards, MD;  Location: [facility];  Service: Orthopedics;  Laterality: N/A;  3 hrs   TOTAL HIP ARTHROPLASTY  2011   RIGHT   TOTAL KNEE ARTHROPLASTY Right 08/24/2021   Procedure: TOTAL KNEE ARTHROPLASTY;  Surgeon: Liliane Rei, MD;  Location: [facility];  Service: Orthopedics;  Laterality: Right;   Family History  Problem Relation Age of Onset   Breast cancer Mother        breast   Osteoporosis Mother    Heart disease Mother    Heart disease Father    Stroke Father    Coronary artery disease Brother        80s   Heart disease Brother    Esophageal varices Brother    Osteoporosis Maternal Aunt    Stroke Other        FAMILY   Esophageal varices Other        FAMILY   Colon cancer Neg Hx    Esophageal cancer Neg Hx    Pancreatic cancer Neg Hx    Stomach cancer Neg Hx     Current Outpatient Medications:    Ascorbic Acid (VITAMIN C PO), Take 1 tablet by mouth daily., Disp: , Rfl:    azithromycin  (ZITHROMAX ) 250 MG tablet, Take 500 mg once, then 250 mg for four days (Patient not taking: Reported on 09/05/2023), Disp: 6 tablet, Rfl: 0   benzonatate  (TESSALON ) 200 MG capsule, Take 1 capsule (200 mg total) by mouth 3 (three) times daily as needed. (Patient not taking: Reported on 09/05/2023), Disp: 30 capsule, Rfl: 1   buPROPion  (WELLBUTRIN  XL) 300 MG 24 hr tablet, TAKE 1 TABLET (300 MG TOTAL) BY MOUTH DAILY. FOR DEPRESSION, Disp: 90 tablet, Rfl: 0   busPIRone  (BUSPAR ) 5 MG tablet, TAKE 1 TABLET BY MOUTH TWICE A DAY AS NEEDED, Disp: 180 tablet, Rfl: 0   CALCIUM  PO, Take 1 tablet by mouth  daily., Disp: , Rfl:    cetirizine  (ZYRTEC  ALLERGY) 10 MG tablet, Take 1 tablet (10 mg total) by mouth daily., Disp: 90 tablet, Rfl: 1   Cholecalciferol  (VITAMIN D3 PO), Take 1 tablet by mouth daily., Disp: , Rfl:    diclofenac  Sodium (VOLTAREN ) 1 % GEL, Apply 2 g topically 3 (three) times daily as needed (pain)., Disp: 200 g, Rfl: PRN   docusate sodium  (COLACE) 100 MG capsule, Take 100 mg by mouth daily., Disp: , Rfl:    DULoxetine  (CYMBALTA ) 60 MG capsule, TAKE 1 CAPSULE (60 MG TOTAL)  BY MOUTH DAILY. FOR DEPRESSION AND CHRONIC PAIN, Disp: 90 capsule, Rfl: 1   ferrous sulfate  325 (65 FE) MG tablet, TAKE 1 TAB IN THE MORNING AND 1 TAB AT BEDTIME. TAKE WITH VIT C OR A GLASS OF ORANGE JUICE, Disp: 180 tablet, Rfl: 0   fluticasone  (FLONASE ) 50 MCG/ACT nasal spray, Place 2 sprays into both nostrils daily., Disp: 16 g, Rfl: 6   HYDROcodone -acetaminophen  (NORCO/VICODIN) 5-325 MG tablet, Take 1 tablet by mouth in the morning, at noon, and at bedtime., Disp: , Rfl:    levothyroxine  (SYNTHROID ) 88 MCG tablet, TAKE 1 TABLET BY MOUTH EVERY DAY BEFORE BREAKFAST FOR THYROID , Disp: 90 tablet, Rfl: 0   meclizine  (ANTIVERT ) 25 MG tablet, TAKE ONE TABLET BY MOUTH THREE TIMES DAILY AS NEEDED FOR DIZZINESS, Disp: 30 tablet, Rfl: 2   methocarbamol  (ROBAXIN ) 500 MG tablet, TAKE 1 TABLET BY MOUTH EVERY 8 HOURS AS NEEDED FOR MUSCLE SPASM, Disp: 40 tablet, Rfl: 0   Multiple Vitamin (MULTIVITAMIN WITH MINERALS) TABS tablet, Take 1 tablet by mouth daily., Disp: , Rfl:    nystatin  cream (MYCOSTATIN ), Apply 1 application topically 2 (two) times daily as needed (rash)., Disp: 30 g, Rfl: 2   traZODone  (DESYREL ) 50 MG tablet, TAKE 1-2 TABLETS (50-100 MG TOTAL) BY MOUTH AT BEDTIME AS NEEDED. FOR SLEEP, Disp: 180 tablet, Rfl: 0  Allergies  Allergen Reactions   Tape Other (See Comments)    Skin is very sensitive to adhesive tape.   Gabapentin  Other (See Comments)    Dizzy   Oxycodone  Itching     ROS: Review of  Systems {ros; complete:30496}    Physical exam {Exam, Complete:[phone]}      09/05/2023   10:31 AM 05/23/2023    3:01 PM 03/08/2023    3:20 PM  Depression screen PHQ 2/9  Decreased Interest 0 0 0  Down, Depressed, Hopeless 1 1 0  PHQ - 2 Score 1 1 0  Altered sleeping 0 0 0  Tired, decreased energy 3 3 0  Change in appetite 0 0 0  Feeling bad or failure about yourself  1 1 0  Trouble concentrating 1 1 0  Moving slowly or fidgety/restless 0 1 0  Suicidal thoughts 0 0 0  PHQ-9 Score 6 7 0  Difficult doing work/chores  Not difficult at all Not difficult at all      05/23/2023    3:09 PM 05/23/2023    3:01 PM 03/08/2023    3:21 PM 11/09/2022    3:29 PM  GAD 7 : Generalized Anxiety Score  Nervous, Anxious, on Edge 1 0 0 0  Control/stop worrying 1 0 0 0  Worry too much - different things 1 0 0 0  Trouble relaxing 0 0 0 0  Restless 0 0 0 0  Easily annoyed or irritable 0 0 0 0  Afraid - awful might happen 0 0 0 0  Total GAD 7 Score 3 0 0 0  Anxiety Difficulty Not difficult at all Not difficult at all Not difficult at all Not difficult at all     Assessment/ Plan: Adam Adam here for annual physical exam.   Annual physical exam  Acquired hypothyroidism  Stage 3a chronic kidney disease (HCC)  Iron  deficiency anemia, unspecified iron  deficiency anemia type  Malignant neoplasm of ascending colon (HCC)  History of colectomy  ***  Counseled on healthy lifestyle choices, including diet (rich in fruits, vegetables and lean meats and low in salt and simple carbohydrates) and exercise (at  least 30 minutes of moderate physical activity daily).  Patient to follow up ***  Sencere Symonette M. Bonnell Butcher, DO

## 2023-11-23 ENCOUNTER — Ambulatory Visit (INDEPENDENT_AMBULATORY_CARE_PROVIDER_SITE_OTHER): Payer: Medicare Other | Admitting: Family Medicine

## 2023-11-23 ENCOUNTER — Encounter: Payer: Self-pay | Admitting: Family Medicine

## 2023-11-23 VITALS — BP 123/65 | HR 75 | Temp 98.6°F | Ht 68.0 in | Wt 185.8 lb

## 2023-11-23 DIAGNOSIS — D509 Iron deficiency anemia, unspecified: Secondary | ICD-10-CM | POA: Diagnosis not present

## 2023-11-23 DIAGNOSIS — Z974 Presence of external hearing-aid: Secondary | ICD-10-CM | POA: Diagnosis not present

## 2023-11-23 DIAGNOSIS — L853 Xerosis cutis: Secondary | ICD-10-CM | POA: Diagnosis not present

## 2023-11-23 DIAGNOSIS — M48062 Spinal stenosis, lumbar region with neurogenic claudication: Secondary | ICD-10-CM | POA: Diagnosis not present

## 2023-11-23 DIAGNOSIS — Z0001 Encounter for general adult medical examination with abnormal findings: Secondary | ICD-10-CM

## 2023-11-23 DIAGNOSIS — N1831 Chronic kidney disease, stage 3a: Secondary | ICD-10-CM

## 2023-11-23 DIAGNOSIS — E039 Hypothyroidism, unspecified: Secondary | ICD-10-CM | POA: Diagnosis not present

## 2023-11-23 DIAGNOSIS — Z9049 Acquired absence of other specified parts of digestive tract: Secondary | ICD-10-CM

## 2023-11-23 DIAGNOSIS — C182 Malignant neoplasm of ascending colon: Secondary | ICD-10-CM

## 2023-11-23 DIAGNOSIS — Z0000 Encounter for general adult medical examination without abnormal findings: Secondary | ICD-10-CM

## 2023-11-23 LAB — LIPID PANEL

## 2023-11-23 NOTE — Patient Instructions (Signed)
Basic Skin Care Your skin plays an important role in keeping the entire body healthy.  Below are some tips on how to try and maximize skin health from the outside in.  Bathe in mildly warm water every 1 to 3 days, followed by light drying and an application of a thick moisturizer cream or ointment, preferably one that comes in a tub. Fragrance free moisturizing bars or body washes are preferred such as Purpose, Cetaphil, Dove sensitive skin, Aveeno, [place] Baby or Vanicream products. Use a fragrance free cream or ointment, not a lotion, such as plain petroleum jelly or Vaseline ointment, Aquaphor, Vanicream, Eucerin cream or a generic version, CeraVe Cream, Cetaphil Restoraderm, Aveeno Eczema Therapy and [place] Baby Calming, among others. Children with very dry skin often need to put on these creams two, three or four times a day.  As much as possible, use these creams enough to keep the skin from looking dry. Consider using fragrance free/dye free detergent, such as Arm and Hammer for sensitive skin, Tide Free or All Free.   If I am prescribing a medication to go on the skin, the medicine goes on first to the areas that need it, followed by a thick cream as above to the entire body.  Sun is a major cause of damage to the skin. I recommend sun protection for all of my patients. I prefer physical barriers such as hats with wide brims that cover the ears, long sleeve clothing with SPF protection including rash guards for swimming. These can be found seasonally at outdoor clothing companies, [employer] and [employer] and online at www.coolibar.com, www.uvskinz.com and www.sunprecautions.com. Avoid peak sun between the hours of 10am to 3pm to minimize sun exposure.  I recommend sunscreen for all of my patients older than 6 months of age when in the sun, preferably with broad spectrum coverage and SPF 30 or higher.  For children, I recommend sunscreens that only contain titanium dioxide and/or zinc oxide  in the active ingredients. These do not burn the eyes and appear to be safer than chemical sunscreens. These sunscreens include zinc oxide paste found in the diaper section, Vanicream Broad Spectrum 50+, Aveeno Natural Mineral Protection, Neutrogena Pure and Free Baby, Johnson [employer] Johnson Baby Daily face and body lotion, [place] Baby products, among others. There is no such thing as waterproof sunscreen. All sunscreens should be reapplied after 60-80 minutes of wear.  Spray on sunscreens often use chemical sunscreens which do protect against the sun. However, these can be difficult to apply correctly, especially if wind is present, and can be more likely to irritate the skin.  Long term effects of chemical sunscreens are also not fully known.     

## 2023-11-24 LAB — CMP14+EGFR
ALT: 17 IU/L (ref 0–32)
AST: 23 IU/L (ref 0–40)
Albumin: 4.6 g/dL (ref 3.7–4.7)
Alkaline Phosphatase: 65 IU/L (ref 44–121)
BUN/Creatinine Ratio: 14 (ref 12–28)
BUN: 16 mg/dL (ref 8–27)
Bilirubin Total: 0.4 mg/dL (ref 0.0–1.2)
CO2: 18 mmol/L — ABNORMAL LOW (ref 20–29)
Calcium: 10.2 mg/dL (ref 8.7–10.3)
Chloride: 103 mmol/L (ref 96–106)
Creatinine, Ser: 1.14 mg/dL — ABNORMAL HIGH (ref 0.57–1.00)
Globulin, Total: 2.1 g/dL (ref 1.5–4.5)
Glucose: 103 mg/dL — ABNORMAL HIGH (ref 70–99)
Potassium: 4.9 mmol/L (ref 3.5–5.2)
Sodium: 138 mmol/L (ref 134–144)
Total Protein: 6.7 g/dL (ref 6.0–8.5)
eGFR: 47 mL/min/{1.73_m2} — ABNORMAL LOW (ref >59)

## 2023-11-24 LAB — CBC
Hematocrit: 38.6 % (ref 34.0–46.6)
Hemoglobin: 12.9 g/dL (ref 11.1–15.9)
MCH: 30.9 pg (ref 26.6–33.0)
MCHC: 33.4 g/dL (ref 31.5–35.7)
MCV: 92 fL (ref 79–97)
Platelets: 209 10*3/uL (ref 150–450)
RBC: 4.18 x10E6/uL (ref 3.77–5.28)
RDW: 13.3 % (ref 11.7–15.4)
WBC: 7.5 10*3/uL (ref 3.4–10.8)

## 2023-11-24 LAB — IRON,TIBC AND FERRITIN PANEL
Ferritin: 33 ng/mL (ref 15–150)
Iron Saturation: 20 % (ref 15–55)
Iron: 79 ug/dL (ref 27–139)
Total Iron Binding Capacity: 386 ug/dL (ref 250–450)
UIBC: 307 ug/dL (ref 118–369)

## 2023-11-24 LAB — VITAMIN D 25 HYDROXY (VIT D DEFICIENCY, FRACTURES): Vit D, 25-Hydroxy: 44.4 ng/mL (ref 30.0–100.0)

## 2023-11-24 LAB — LIPID PANEL
Cholesterol, Total: 165 mg/dL (ref 100–199)
HDL: 45 mg/dL (ref >39)
LDL CALC COMMENT:: 3.7 ratio (ref 0.0–4.4)
LDL Chol Calc (NIH): 88 mg/dL (ref 0–99)
Triglycerides: 185 mg/dL — ABNORMAL HIGH (ref 0–149)
VLDL Cholesterol Cal: 32 mg/dL (ref 5–40)

## 2023-11-24 LAB — TSH+FREE T4
Free T4: 1.46 ng/dL (ref 0.82–1.77)
TSH: 3.01 u[IU]/mL (ref 0.450–4.500)

## 2023-11-25 ENCOUNTER — Ambulatory Visit: Payer: Self-pay | Admitting: Family Medicine

## 2023-12-14 ENCOUNTER — Other Ambulatory Visit: Payer: Self-pay | Admitting: Family Medicine

## 2023-12-14 DIAGNOSIS — E039 Hypothyroidism, unspecified: Secondary | ICD-10-CM

## 2023-12-19 ENCOUNTER — Other Ambulatory Visit (HOSPITAL_COMMUNITY): Payer: Self-pay | Admitting: General Surgery

## 2023-12-19 DIAGNOSIS — C182 Malignant neoplasm of ascending colon: Secondary | ICD-10-CM

## 2023-12-26 DIAGNOSIS — M961 Postlaminectomy syndrome, not elsewhere classified: Secondary | ICD-10-CM | POA: Diagnosis not present

## 2023-12-26 DIAGNOSIS — M48061 Spinal stenosis, lumbar region without neurogenic claudication: Secondary | ICD-10-CM | POA: Diagnosis not present

## 2023-12-26 DIAGNOSIS — M51369 Other intervertebral disc degeneration, lumbar region without mention of lumbar back pain or lower extremity pain: Secondary | ICD-10-CM | POA: Diagnosis not present

## 2024-01-16 ENCOUNTER — Ambulatory Visit (HOSPITAL_COMMUNITY)
Admission: RE | Admit: 2024-01-16 | Discharge: 2024-01-16 | Disposition: A | Discharge status: Home / Self Care | Source: Ambulatory Visit | Attending: General Surgery | Admitting: General Surgery

## 2024-01-16 DIAGNOSIS — Z9049 Acquired absence of other specified parts of digestive tract: Secondary | ICD-10-CM | POA: Diagnosis not present

## 2024-01-16 DIAGNOSIS — I700 Atherosclerosis of aorta: Secondary | ICD-10-CM | POA: Diagnosis not present

## 2024-01-16 DIAGNOSIS — C182 Malignant neoplasm of ascending colon: Secondary | ICD-10-CM | POA: Diagnosis not present

## 2024-01-16 MED ORDER — IOHEXOL 300 MG/ML  SOLN
100.0000 mL | Freq: Once | INTRAMUSCULAR | Status: AC | PRN
  Administered 2024-01-16: 100 mL via INTRAVENOUS

## 2024-02-03 ENCOUNTER — Other Ambulatory Visit

## 2024-02-03 DIAGNOSIS — C182 Malignant neoplasm of ascending colon: Secondary | ICD-10-CM | POA: Diagnosis not present

## 2024-02-07 ENCOUNTER — Other Ambulatory Visit: Payer: Self-pay | Admitting: Family Medicine

## 2024-02-07 DIAGNOSIS — F419 Anxiety disorder, unspecified: Secondary | ICD-10-CM

## 2024-02-07 DIAGNOSIS — F32A Depression, unspecified: Secondary | ICD-10-CM

## 2024-02-07 DIAGNOSIS — M48062 Spinal stenosis, lumbar region with neurogenic claudication: Secondary | ICD-10-CM

## 2024-02-08 ENCOUNTER — Telehealth: Payer: Self-pay | Admitting: *Deleted

## 2024-02-08 DIAGNOSIS — C182 Malignant neoplasm of ascending colon: Secondary | ICD-10-CM | POA: Diagnosis not present

## 2024-02-08 NOTE — Telephone Encounter (Signed)
-----   Message from [name] sent at 02/08/2024  3:16 PM EDT ----- Regarding: RE: colonoscopy Brenda Solis, I will get her into the office to discuss. Thanks, John  Dottie, Please get Ms. Brenda Solis an office appointment with myself.  Reason history of colon cancer, consider colonoscopy. Thanks, Dr. SHAUNNA ----- Message ----- From: Debby Bernarda, MD Sent: 02/08/2024   2:18 PM EDT To: [name], MD Subject: colonoscopy                                    John,  We treated her for a right sided colon cancer last August.  She is a rather robust 89 year old.  Do you think we should perform 1 last post surgery colonoscopy?  [name]

## 2024-02-08 NOTE — Telephone Encounter (Signed)
 Patient advised of appointment with Dr Abran 03/14/24 to discuss colonoscopy.

## 2024-02-08 NOTE — Telephone Encounter (Signed)
 Patient has been scheduled to see Dr Abran in the office on Wednesday 03/14/24 at 340 pm.  Left message for patient to call back.

## 2024-03-02 ENCOUNTER — Encounter: Payer: Self-pay | Admitting: Family Medicine

## 2024-03-02 ENCOUNTER — Telehealth: Admitting: Family Medicine

## 2024-03-02 DIAGNOSIS — J209 Acute bronchitis, unspecified: Secondary | ICD-10-CM

## 2024-03-02 MED ORDER — AZITHROMYCIN 250 MG PO TABS: ORAL_TABLET | ORAL | 0 refills | Status: DC

## 2024-03-02 MED ORDER — PREDNISONE 10 MG (21) PO TBPK: ORAL_TABLET | ORAL | 0 refills | Status: DC

## 2024-03-02 NOTE — Progress Notes (Signed)
 Virtual Visit via video Note   Due to COVID-19 pandemic this visit was conducted virtually. This visit type was conducted due to national recommendations for restrictions regarding the COVID-19 Pandemic (e.g. social distancing, sheltering in place) in an effort to limit this patient's exposure and mitigate transmission in our community. All issues noted in this document were discussed and addressed.  A physical exam was not performed with this format.  I connected with  Brenda Solis  on 03/02/24 at 1432 by video and verified that I am speaking with the correct person using two identifiers. Brenda Solis is currently located at home and no one is currently with her during the visit. The provider, Annabella CHRISTELLA Search, FNP is located in their office at time of visit.  I discussed the limitations, risks, security and privacy concerns of performing an evaluation and management service by video  and the availability of in person appointments. I also discussed with the patient that there may be a patient responsible charge related to this service. The patient expressed understanding and agreed to proceed.  CC: cough  History and Present Illness:  History of Present Illness   Brenda Solis is an 89 year old female who presents with bronchitis-like symptoms.  Cough - Coughing fits without sputum production - No associated chest pain, shortness of breath, or wheezing - Feels like she has bronchitis  Constitutional and upper respiratory symptoms - No fever, chills, sore throat, nasal congestion, runny nose, or headache  Gastrointestinal symptoms - No nausea, vomiting, or diarrhea  Exposure history - No recent contact with sick individuals  Pulmonary history - No history of smoking, asthma, COPD, or pneumonia - Prior history of bronchitis multiple times  Response to prior treatment - Previous episodes of bronchitis have responded well to azithromycin  (Z-Pak) and prednisone          ROS As per HPI.   Observations/Objective: Alert and oriented. Respirations unlabored. No cyanosis. Non toxic appearing. Normal mood and behavior.    Assessment and Plan: Audrinna was seen today for cough.  Diagnoses and all orders for this visit:  Acute bronchitis, unspecified organism -     predniSONE  (STERAPRED UNI-PAK 21 TAB) 10 MG (21) TBPK tablet; Use as directed on back of pill pack -     azithromycin  (ZITHROMAX ) 250 MG tablet; Take 500 mg once, then 250 mg for four days      Acute bronchitis Acute bronchitis without sputum, fever, or respiratory distress. No asthma, COPD, or pneumonia. Previous episodes responded to azithromycin  and prednisone . - Prescribed prednisone  dose pack. - Prescribed azithromycin  (Z-Pak). - Sent prescriptions to [facility] in [place]. - Advised to contact clinic if symptoms do not improve.         Follow Up Instructions: As needed.     I discussed the assessment and treatment plan with the patient. The patient was provided an opportunity to ask questions and all were answered. The patient agreed with the plan and demonstrated an understanding of the instructions.   The patient was advised to call back or seek an in-person evaluation if the symptoms worsen or if the condition fails to improve as anticipated.  The above assessment and management plan was discussed with the patient. The patient verbalized understanding of and has agreed to the management plan. Patient is aware to call the clinic if symptoms persist or worsen. Patient is aware when to return to the clinic for a follow-up visit. Patient educated on when it is appropriate to  go to the emergency department.   Time call ended: 1438  I provided 6 minutes of face-to-face time during this encounter.    Annabella CHRISTELLA Search, FNP

## 2024-03-10 ENCOUNTER — Other Ambulatory Visit: Payer: Self-pay | Admitting: Family Medicine

## 2024-03-10 DIAGNOSIS — F32A Depression, unspecified: Secondary | ICD-10-CM

## 2024-03-14 ENCOUNTER — Encounter: Payer: Self-pay | Admitting: Internal Medicine

## 2024-03-14 ENCOUNTER — Ambulatory Visit: Admitting: Internal Medicine

## 2024-03-14 VITALS — BP 122/64 | HR 81 | Ht 66.75 in | Wt 190.0 lb

## 2024-03-14 DIAGNOSIS — Z85038 Personal history of other malignant neoplasm of large intestine: Secondary | ICD-10-CM

## 2024-03-14 DIAGNOSIS — Z860101 Personal history of adenomatous and serrated colon polyps: Secondary | ICD-10-CM

## 2024-03-14 MED ORDER — NA SULFATE-K SULFATE-MG SULF 17.5-3.13-1.6 GM/177ML PO SOLN: 1.0000 | Freq: Once | ORAL | 0 refills | Status: AC

## 2024-03-14 NOTE — Patient Instructions (Signed)
 You have been scheduled for a colonoscopy. Please follow written instructions given to you at your visit today.   If you use inhalers (even only as needed), please bring them with you on the day of your procedure.  DO NOT TAKE 7 DAYS PRIOR TO TEST- Trulicity (dulaglutide) Ozempic, Wegovy (semaglutide) Mounjaro (tirzepatide) Bydureon Bcise (exanatide extended release)  DO NOT TAKE 1 DAY PRIOR TO YOUR TEST Rybelsus (semaglutide) Adlyxin (lixisenatide) Victoza (liraglutide) Byetta (exanatide) ___________________________________________________________________________  _______________________________________________________  If your blood pressure at your visit was 140/90 or greater, please contact your primary care physician to follow up on this.  _______________________________________________________  If you are age 39 or older, your body mass index should be between 23-30. Your Body mass index is 29.98 kg/m. If this is out of the aforementioned range listed, please consider follow up with your Primary Care Provider.  If you are age 71 or younger, your body mass index should be between 19-25. Your Body mass index is 29.98 kg/m. If this is out of the aformentioned range listed, please consider follow up with your Primary Care Provider.   ________________________________________________________  The [facility] providers would like to encourage you to use MYCHART to communicate with providers for non-urgent requests or questions.  Due to long hold times on the telephone, sending your provider a message by [facility] may be a faster and more efficient way to get a response.  Please allow 48 business hours for a response.  Please remember that this is for non-urgent requests.  _______________________________________________________  [facility] is using a team-based approach to care.  Your team is made up of your doctor and two to three APPS. Our APPS (Nurse Practitioners and  Physician Assistants) work with your physician to ensure care continuity for you. They are fully qualified to address your health concerns and develop a treatment plan. They communicate directly with your gastroenterologist to care for you. Seeing the Advanced Practice Practitioners on your physician's team can help you by facilitating care more promptly, often allowing for earlier appointments, access to diagnostic testing, procedures, and other specialty referrals.

## 2024-03-14 NOTE — Progress Notes (Signed)
 HISTORY OF PRESENT ILLNESS:  Brenda Solis is a 89 y.o. female with past medical history as listed below.  She has a history of multiple advanced adenomatous colon polyps as well as colon cancer of the cecum diagnosed July 2024.  She subsequently underwent right hemicolectomy July 2025.  She was recently seen in follow-up by her general surgeon.  The patient is in excellent physical health, and she wondered about repeat surveillance colonoscopy.  The patient is accompanied today by her daughter.  Patient has no complaints aside from back pain from spinal stenosis.  REVIEW OF SYSTEMS:  All non-GI ROS negative.  Past Medical History:  Diagnosis Date   Anemia    Anxiety    Cancer (HCC)    Depression    Heart murmur    Hyperlipidemia    Hypertension    Hypothyroidism    Insomnia, unspecified    Pneumonia    Spondylosis of unspecified site without mention of myelopathy    Thyroid  disease    hypothyroidism    Past Surgical History:  Procedure Laterality Date   APPENDECTOMY     CATARACT EXTRACTION, BILATERAL Bilateral 2006   CERVICAL DISC SURGERY  1999   CESAREAN SECTION  1962 and 1967   CHOLECYSTECTOMY  1990   DECOMPRESSIVE LUMBAR LAMINECTOMY LEVEL 1 N/A 02/01/2013   Procedure: CENTRAL DECOMPRESSIVE LUMBAR LAMINECTOMY LEVEL 1 L4-L5;  Surgeon: Tanda DELENA Heading, MD;  Location: [facility];  Service: Orthopedics;  Laterality: N/A;   DECOMPRESSIVE LUMBAR LAMINECTOMY LEVEL 1 N/A 11/20/2014   Procedure: REVISION L4-S1 DECOMPRESSION, L4-L5 IN SITU FUSION (LEVEL 1);  Surgeon: Donaciano Sprang, MD;  Location: [facility];  Service: Orthopedics;  Laterality: N/A;   KNEE SURGERY Right 2009   meniscus repair   SPINAL CORD STIMULATOR INSERTION N/A 06/25/2020   Procedure: SPINAL CORD STIMULATOR INSERTION;  Surgeon: Sprang Donaciano, MD;  Location: [facility];  Service: Orthopedics;  Laterality: N/A;  3 hrs   TOTAL HIP ARTHROPLASTY  2011   RIGHT   TOTAL KNEE ARTHROPLASTY Right 08/24/2021   Procedure: TOTAL KNEE  ARTHROPLASTY;  Surgeon: Melodi Lerner, MD;  Location: [facility];  Service: Orthopedics;  Laterality: Right;    Social History Brenda Solis  reports that she has never smoked. She has never used smokeless tobacco. She reports that she does not drink alcohol and does not use drugs.  family history includes Breast cancer in her mother; Coronary artery disease in her brother; Esophageal varices in her brother and another family member; Heart disease in her brother, father, and mother; Osteoporosis in her maternal aunt and mother; Stroke in her father and another family member.  Allergies  Allergen Reactions   Tape Other (See Comments)    Skin is very sensitive to adhesive tape.   Gabapentin  Other (See Comments)    Dizzy   Oxycodone  Itching       PHYSICAL EXAMINATION: Vital signs: BP 122/64 (BP Location: Left Arm, Patient Position: Sitting, Cuff Size: Normal)   Pulse 81   Ht 5' 6.75 (1.695 m) Comment: height measured without shoes  Wt 190 lb (86.2 kg)   BMI 29.98 kg/m   Constitutional: generally well-appearing, no acute distress Psychiatric: alert and oriented x3, cooperative Eyes: extraocular movements intact, anicteric, conjunctiva pink Mouth: oral pharynx moist, no lesions Neck: supple no lymphadenopathy Cardiovascular: heart regular rate and rhythm, no murmur Lungs: clear to auscultation bilaterally Abdomen: soft, nontender, nondistended, no obvious ascites, no peritoneal signs, normal bowel sounds, no organomegaly Rectal: Deferred until colonoscopy Extremities: no  clubbing, cyanosis, or lower extremity edema bilaterally Skin: no lesions on visible extremities Neuro: No focal deficits.  Cranial nerves intact  ASSESSMENT:  1.  Personal history of multiple advanced adenomatous colon polyps as well as cecal cancer status post right hemicolectomy August 2024. 2.  She is in excellent physical condition except for chronic back pain.  We discussed the pros and cons of follow-up  surveillance   PLAN:  1.  Patient is interested in surveillance colonoscopy.  She is an appropriate candidate without contraindication.The nature of the procedure, as well as the risks, benefits, and alternatives were carefully and thoroughly reviewed with the patient. Ample time for discussion and questions allowed. The patient understood, was satisfied, and agreed to proceed.

## 2024-03-27 ENCOUNTER — Ambulatory Visit (AMBULATORY_SURGERY_CENTER): Admitting: Internal Medicine

## 2024-03-27 ENCOUNTER — Encounter: Payer: Self-pay | Admitting: Internal Medicine

## 2024-03-27 VITALS — BP 134/62 | HR 70 | Temp 97.5°F | Resp 21 | Ht 66.0 in | Wt 190.0 lb

## 2024-03-27 DIAGNOSIS — Z1211 Encounter for screening for malignant neoplasm of colon: Secondary | ICD-10-CM | POA: Diagnosis not present

## 2024-03-27 DIAGNOSIS — E785 Hyperlipidemia, unspecified: Secondary | ICD-10-CM | POA: Diagnosis not present

## 2024-03-27 DIAGNOSIS — Z9049 Acquired absence of other specified parts of digestive tract: Secondary | ICD-10-CM

## 2024-03-27 DIAGNOSIS — Z85038 Personal history of other malignant neoplasm of large intestine: Secondary | ICD-10-CM

## 2024-03-27 DIAGNOSIS — Z860101 Personal history of adenomatous and serrated colon polyps: Secondary | ICD-10-CM | POA: Diagnosis not present

## 2024-03-27 DIAGNOSIS — Z980 Intestinal bypass and anastomosis status: Secondary | ICD-10-CM | POA: Diagnosis not present

## 2024-03-27 DIAGNOSIS — I10 Essential (primary) hypertension: Secondary | ICD-10-CM | POA: Diagnosis not present

## 2024-03-27 DIAGNOSIS — D122 Benign neoplasm of ascending colon: Secondary | ICD-10-CM | POA: Diagnosis not present

## 2024-03-27 MED ORDER — SODIUM CHLORIDE 0.9 % IV SOLN: 500.0000 mL | Freq: Once | INTRAVENOUS | Status: DC

## 2024-03-27 NOTE — Op Note (Signed)
 [facility] Patient Name: Brenda Solis Procedure Date: 03/27/2024 2:34 PM MRN: [id number] Endoscopist: Norleen SAILOR. Abran , MD, [id number] Age: 89 Referring MD:  Date of Birth: 12-09-1934 Gender: Female Account #: [id number] Procedure:                Colonoscopy with cold snare polypectomy x 2 Indications:              High risk colon cancer surveillance: Personal                            history of colon cancer. Multiple prior                            colonoscopies. Colon cancer diagnosed July 2024.                            Status post laparoscopic right hemicolectomy August                            2024. Now for surveillance Medicines:                Monitored Anesthesia Care Procedure:                Pre-Anesthesia Assessment:                           - Prior to the procedure, a History and Physical                            was performed, and patient medications and                            allergies were reviewed. The patient's tolerance of                            previous anesthesia was also reviewed. The risks                            and benefits of the procedure and the sedation                            options and risks were discussed with the patient.                            All questions were answered, and informed consent                            was obtained. Prior Anticoagulants: The patient has                            taken no anticoagulant or antiplatelet agents. ASA                            Grade Assessment: II - A patient with mild systemic  disease. After reviewing the risks and benefits,                            the patient was deemed in satisfactory condition to                            undergo the procedure.                           After obtaining informed consent, the colonoscope                            was passed under direct vision. Throughout the                            procedure, the  patient's blood pressure, pulse, and                            oxygen saturations were monitored continuously. The                            Olympus Scope [id number] was introduced through the                            anus and advanced to the the ileocolonic                            anastomosis. The terminal ileum and the rectum were                            photographed. The quality of the bowel preparation                            was excellent. The colonoscopy was performed                            without difficulty. The patient tolerated the                            procedure well. The bowel preparation used was                            SUPREP via split dose instruction. Scope In: 2:49:51 PM Scope Out: 3:04:14 PM Scope Withdrawal Time: 0 hours 10 minutes 5 seconds  Total Procedure Duration: 0 hours 14 minutes 23 seconds  Findings:                 Two polyps were found in the ascending colon. The                            polyps were 3 to 4 mm in size. These polyps were                            removed with a cold snare. Resection  and retrieval                            were complete.                           There was evidence of prior right sided colectomy                            with unremarkable ileocolonic anastomosis. The                            ileum was intubated and appeared normal. The exam                            of the colon was otherwise without abnormality on                            direct and retroflexion views. Complications:            No immediate complications. Estimated blood loss:                            None. Estimated Blood Loss:     Estimated blood loss: none. Impression:               - Two 3 to 4 mm polyps in the ascending colon,                            removed with a cold snare. Resected and retrieved.                           - Normal neoileum                           - Status post right hemicolectomy                            - The examination was otherwise normal on direct                            and retroflexion views. Recommendation:           - Repeat colonoscopy is not recommended for                            surveillance.                           - Patient has a contact number available for                            emergencies. The signs and symptoms of potential                            delayed complications were discussed with the  patient. Return to normal activities tomorrow.                            Written discharge instructions were provided to the                            patient.                           - Resume previous diet.                           - Continue present medications.                           - Await pathology results. Norleen SAILOR. Abran, MD 03/27/2024 3:11:38 PM This report has been signed electronically.

## 2024-03-27 NOTE — Patient Instructions (Addendum)
 Resume previous diet Continue present medications Await pathology results You will NOT need another screening colonoscopy! HOWEVER,  Please call DR PERRY at [phone] if you have a change in bowel habits, change in family history of colo-rectal cancer, rectal bleeding or other GI concern IN THE FUTURE!!  Handouts/information given for polyps  YOU HAD AN ENDOSCOPIC PROCEDURE TODAY AT THE [facility]:   Refer to the procedure report that was given to you for any specific questions about what was found during the examination.  If the procedure report does not answer your questions, please call your gastroenterologist to clarify.  If you requested that your care partner not be given the details of your procedure findings, then the procedure report has been included in a sealed envelope for you to review at your convenience later.  YOU SHOULD EXPECT: Some feelings of bloating in the abdomen. Passage of more gas than usual.  Walking can help get rid of the air that was put into your GI tract during the procedure and reduce the bloating. If you had a lower endoscopy (such as a colonoscopy or flexible sigmoidoscopy) you may notice spotting of blood in your stool or on the toilet paper. If you underwent a bowel prep for your procedure, you may not have a normal bowel movement for a few days.  Please Note:  You might notice some irritation and congestion in your nose or some drainage.  This is from the oxygen used during your procedure.  There is no need for concern and it should clear up in a day or so.  SYMPTOMS TO REPORT IMMEDIATELY:  Following lower endoscopy (colonoscopy):  Excessive amounts of blood in the stool  Significant tenderness or worsening of abdominal pains  Swelling of the abdomen that is new, acute  Fever of 100F or higher For urgent or emergent issues, a gastroenterologist can be reached at any hour by calling [phone]. Do not use MyChart messaging for urgent  concerns.   DIET:  We do recommend a small meal at first, but then you may proceed to your regular diet.  Drink plenty of fluids but you should avoid alcoholic beverages for 24 hours.  ACTIVITY:  You should plan to take it easy for the rest of today and you should NOT DRIVE or use heavy machinery until tomorrow (because of the sedation medicines used during the test).    FOLLOW UP: Our staff will call the number listed on your records the next business day following your procedure.  We will call around 7:15- 8:00 am to check on you and address any questions or concerns that you may have regarding the information given to you following your procedure. If we do not reach you, we will leave a message.     If any biopsies were taken you will be contacted by phone or by letter within the next 1-3 weeks.  Please call us  at [phone] if you have not heard about the biopsies in 3 weeks.   SIGNATURES/CONFIDENTIALITY: You and/or your care partner have signed paperwork which will be entered into your electronic medical record.  These signatures attest to the fact that that the information above on your After Visit Summary has been reviewed and is understood.  Full responsibility of the confidentiality of this discharge information lies with you and/or your care-partner.

## 2024-03-27 NOTE — Progress Notes (Signed)
 Sedate, gd SR, tolerated procedure well, VSS, report to RN

## 2024-03-27 NOTE — Progress Notes (Signed)
 Expand All Collapse All HISTORY OF PRESENT ILLNESS:   Brenda Solis is a 89 y.o. female with past medical history as listed below.  She has a history of multiple advanced adenomatous colon polyps as well as colon cancer of the cecum diagnosed July 2024.  She subsequently underwent right hemicolectomy July 2025.  She was recently seen in follow-up by her general surgeon.  The patient is in excellent physical health, and she wondered about repeat surveillance colonoscopy.   The patient is accompanied today by her daughter.  Patient has no complaints aside from back pain from spinal stenosis.   REVIEW OF SYSTEMS:   All non-GI ROS negative.       Past Medical History:  Diagnosis Date   Anemia     Anxiety     Cancer (HCC)     Depression     Heart murmur     Hyperlipidemia     Hypertension     Hypothyroidism     Insomnia, unspecified     Pneumonia     Spondylosis of unspecified site without mention of myelopathy     Thyroid  disease      hypothyroidism               Past Surgical History:  Procedure Laterality Date   APPENDECTOMY       CATARACT EXTRACTION, BILATERAL Bilateral 2006   CERVICAL DISC SURGERY   1999   CESAREAN SECTION   1962 and 1967   CHOLECYSTECTOMY   1990   DECOMPRESSIVE LUMBAR LAMINECTOMY LEVEL 1 N/A 02/01/2013    Procedure: CENTRAL DECOMPRESSIVE LUMBAR LAMINECTOMY LEVEL 1 L4-L5;  Surgeon: Tanda DELENA Heading, MD;  Location: [facility];  Service: Orthopedics;  Laterality: N/A;   DECOMPRESSIVE LUMBAR LAMINECTOMY LEVEL 1 N/A 11/20/2014    Procedure: REVISION L4-S1 DECOMPRESSION, L4-L5 IN SITU FUSION (LEVEL 1);  Surgeon: Donaciano Sprang, MD;  Location: [facility] OR;  Service: Orthopedics;  Laterality: N/A;   KNEE SURGERY Right 2009    meniscus repair   SPINAL CORD STIMULATOR INSERTION N/A 06/25/2020    Procedure: SPINAL CORD STIMULATOR INSERTION;  Surgeon: Sprang Donaciano, MD;  Location: [facility] OR;  Service: Orthopedics;  Laterality: N/A;  3 hrs   TOTAL HIP ARTHROPLASTY   2011    RIGHT    TOTAL KNEE ARTHROPLASTY Right 08/24/2021    Procedure: TOTAL KNEE ARTHROPLASTY;  Surgeon: Melodi Lerner, MD;  Location: [facility];  Service: Orthopedics;  Laterality: Right;          Social History SCHERYL SANBORN  reports that she has never smoked. She has never used smokeless tobacco. She reports that she does not drink alcohol and does not use drugs.   family history includes Breast cancer in her mother; Coronary artery disease in her brother; Esophageal varices in her brother and another family member; Heart disease in her brother, father, and mother; Osteoporosis in her maternal aunt and mother; Stroke in her father and another family member.   Allergies       Allergies  Allergen Reactions   Tape Other (See Comments)      Skin is very sensitive to adhesive tape.   Gabapentin  Other (See Comments)      Dizzy   Oxycodone  Itching            PHYSICAL EXAMINATION: Vital signs: BP 122/64 (BP Location: Left Arm, Patient Position: Sitting, Cuff Size: Normal)   Pulse 81   Ht 5' 6.75 (1.695 m) Comment: height measured without shoes  Wt 190  lb (86.2 kg)   BMI 29.98 kg/m   Constitutional: generally well-appearing, no acute distress Psychiatric: alert and oriented x3, cooperative Eyes: extraocular movements intact, anicteric, conjunctiva pink Mouth: oral pharynx moist, no lesions Neck: supple no lymphadenopathy Cardiovascular: heart regular rate and rhythm, no murmur Lungs: clear to auscultation bilaterally Abdomen: soft, nontender, nondistended, no obvious ascites, no peritoneal signs, normal bowel sounds, no organomegaly Rectal: Deferred until colonoscopy Extremities: no clubbing, cyanosis, or lower extremity edema bilaterally Skin: no lesions on visible extremities Neuro: No focal deficits.  Cranial nerves intact   ASSESSMENT:   1.  Personal history of multiple advanced adenomatous colon polyps as well as cecal cancer status post right hemicolectomy August 2024. 2.  She is in  excellent physical condition except for chronic back pain.  We discussed the pros and cons of follow-up surveillance     PLAN:   1.  Patient is interested in surveillance colonoscopy.  She is an appropriate candidate without contraindication.The nature of the procedure, as well as the risks, benefits, and alternatives were carefully and thoroughly reviewed with the patient. Ample time for discussion and questions allowed. The patient understood, was satisfied, and agreed to proceed.

## 2024-03-27 NOTE — Progress Notes (Signed)
 Called to room to assist during endoscopic procedure.  Patient ID and intended procedure confirmed with present staff. Received instructions for my participation in the procedure from the performing physician.

## 2024-03-28 ENCOUNTER — Telehealth: Payer: Self-pay | Admitting: *Deleted

## 2024-03-28 NOTE — Telephone Encounter (Signed)
  Follow up Call-     03/27/2024    1:50 PM 01/12/2023   10:50 AM 01/12/2023   10:38 AM  Call back number  Post procedure Call Back phone  # [phone]   Permission to leave phone message Yes  Yes     Patient questions:  Do you have a fever, pain , or abdominal swelling? No. Pain Score  0 *  Have you tolerated food without any problems? Yes.    Have you been able to return to your normal activities? Yes.    Do you have any questions about your discharge instructions: Diet   No. Medications  No. Follow up visit  No.  Do you have questions or concerns about your Care? No.  Actions: * If pain score is 4 or above: No action needed, pain <4.

## 2024-03-28 NOTE — Telephone Encounter (Signed)
 Opened in error

## 2024-03-28 NOTE — Telephone Encounter (Signed)
 No answer on follow up call. Left message.

## 2024-03-30 ENCOUNTER — Ambulatory Visit: Payer: Self-pay | Admitting: Internal Medicine

## 2024-03-30 LAB — SURGICAL PATHOLOGY

## 2024-05-10 ENCOUNTER — Ambulatory Visit (INDEPENDENT_AMBULATORY_CARE_PROVIDER_SITE_OTHER)

## 2024-05-10 DIAGNOSIS — Z23 Encounter for immunization: Secondary | ICD-10-CM

## 2024-05-28 ENCOUNTER — Ambulatory Visit: Payer: Self-pay | Admitting: Family Medicine

## 2024-05-28 VITALS — BP 136/63 | HR 85 | Temp 97.6°F | Ht 67.0 in | Wt 189.1 lb

## 2024-05-28 DIAGNOSIS — E039 Hypothyroidism, unspecified: Secondary | ICD-10-CM

## 2024-05-28 DIAGNOSIS — N1831 Chronic kidney disease, stage 3a: Secondary | ICD-10-CM

## 2024-05-28 DIAGNOSIS — M48062 Spinal stenosis, lumbar region with neurogenic claudication: Secondary | ICD-10-CM

## 2024-05-28 DIAGNOSIS — B372 Candidiasis of skin and nail: Secondary | ICD-10-CM | POA: Diagnosis not present

## 2024-05-28 DIAGNOSIS — F329 Major depressive disorder, single episode, unspecified: Secondary | ICD-10-CM

## 2024-05-28 MED ORDER — NYSTATIN 100000 UNIT/GM EX CREA: 1.0000 | TOPICAL_CREAM | Freq: Two times a day (BID) | CUTANEOUS | 1 refills | Status: AC | PRN

## 2024-05-28 NOTE — Patient Instructions (Addendum)
 Drywick bras and cotton bras can help reduce recurrence of that yeast infection under the breasts.  Intertrigo Intertrigo is skin irritation (inflammation) that happens in warm, moist areas of the body. The irritation can cause a rash and make skin raw and itchy. The rash is usually pink or red. It happens mostly between folds of skin or where skin rubs together, such as: In the armpits. Under the breasts. Under the belly. In the groin area. Around the butt area. Between the toes. This condition is not passed from person to person. What are the causes? Heat, moisture, rubbing, and not enough air movement. The condition can be made worse by: Sweat. Bacteria. A fungus, such as yeast. What increases the risk? Moisture in your skin folds. You are more likely to develop this condition if you: Are not able to move around. Live in a warm and moist climate. Are not able to control your pee (urine) or poop (stool). Wear splints, braces, or other medical devices. Are overweight. Have diabetes. What are the signs or symptoms? A pink or red skin rash in a skin fold or near a skin fold. Raw or scaly skin. Itching. A burning feeling. Bleeding. Leaking fluid. A bad smell. How is this treated? Cleaning and drying your skin. Taking an antibiotic medicine or using an antibiotic skin cream for a bacterial infection. Using an antifungal cream on your skin or taking pills for an infection that was caused by a fungus, such as yeast. Using a steroid ointment to stop the itching and irritation. Separating the skin fold with a clean cotton cloth to absorb moisture and allow air to flow into the area. Follow these instructions at home: Keep the affected area clean and dry. Do not scratch your skin. Stay cool as much as you can. Use an air conditioner or a fan, if you have one. Apply over-the-counter and prescription medicines only as told by your doctor. If you were prescribed antibiotics, use  them as told by your doctor. Do not stop using the antibiotic even if you start to feel better. Keep all follow-up visits. Your doctor may need to check your skin to make sure that the treatment is working. How is this prevented? Shower and dry yourself well after being active. Use a hair dryer on a cool setting to dry between skin folds. Do not wear tight clothes. Wear clothes that: Are loose. Take moisture away from your body. Are made of cotton. Wear a bra that gives good support, if needed. Protect the skin in your groin and butt area as told by your doctor. To do this: Follow a regular cleaning routine. Use creams, powders, or ointments that protect your skin. Change protection pads often. Stay at a healthy weight. Take care of your feet. This is very important if you have diabetes. You should: Wear shoes that fit well. Keep your feet dry. Wear clean cotton or wool socks. Keep your blood sugar under control if you have diabetes. Contact a doctor if: Your symptoms do not get better with treatment. Your symptoms get worse or they spread. You notice more redness and warmth. You have a fever. This information is not intended to replace advice given to you by your health care provider. Make sure you discuss any questions you have with your health care provider. Document Revised: 11/05/2021 Document Reviewed: 11/05/2021 Elsevier Patient Education  2024 [employer].   Chronic Kidney Disease in Adults: What to Know Chronic kidney disease (CKD) is when lasting damage happens  to the kidneys slowly over time. The kidneys are two organs that do many important things in the body. These include: Taking waste and extra fluid out of the blood to make pee (urine). Making hormones. Keeping the right amount of fluids and chemicals in the body. A small amount of kidney damage may not cause problems. You must take steps to help keep the kidney damage from getting worse. A lot of damage may cause  kidney failure. Kidney failure means the kidneys can no longer work right. What are the causes? Diabetes. High blood pressure. Diseases that affect the heart and blood vessels. Other kidney diseases. Diseases that affect the body's defense system (immune system). A problem with the flow of pee. This may be caused by: Kidney stones. Cancer. An enlarged prostate, in males. A kidney infection or urinary tract infection (UTI) that keeps coming back. What increases the risk? Getting older. The chances of having CKD increase with age. A family history of kidney disease or kidney failure. Having a disease caused by genes. Taking medicines that can harm the kidneys. Being near or having contact with harmful substances. Being very overweight. Using tobacco now or in the past. What are the signs or symptoms? Common symptoms of CKD include: Feeling very tired and having less energy. Swelling of the face, legs, ankles, or feet. Throwing up or feeling like you may throw up. Not wanting to eat as much as normal. Being confused or not able to focus. Twitches and cramps in the leg muscles or other muscles. Dry, itchy skin. Other symptoms may include: Shortness of breath. Trouble sleeping. Making less pee, or making more pee, especially at night. A taste of metal in your mouth. You may also become anemic. Anemia means there's not enough red blood cells in your blood. You may get symptoms slowly. You may not notice them until the kidney damage gets very bad. How is this diagnosed? CKD may be diagnosed based on: Tests on your blood or pee. Imaging tests, like an ultrasound or a CT scan. A kidney biopsy. For this test, a sample of kidney tissue is removed to be looked at under a microscope. These tests will help to find out how serious the CKD is. How is this treated? Often, there's no cure for CKD. Treatment can help with symptoms and help keep the disease from getting worse. Treatment may  include: Treating other problems that are causing your CKD or making it worse. Diet changes. You may need to: Avoid alcohol. Avoid foods that are high in salt, potassium, phosphorous, and protein. Taking medicines for symptoms and to help control other conditions. Dialysis. This treatment gets harmful waste out of your body. It may be needed if you have kidney failure. Follow these instructions at home: Medicines Take your medicines only as told. The amount of some medicines you take may need to be changed. Do not take any new medicines, vitamins, or supplements unless your health care provider says it's okay. These may make kidney damage worse. Lifestyle Do not smoke, vape, or use nicotine or tobacco. If you drink alcohol: Limit how much you have to: 0-1 drink a day if you're female. 0-2 drinks a day if you're female. Know how much alcohol is in your drink. In the [place] one drink is one 12 oz bottle of beer (355 mL), one 5 oz glass of wine (148 mL), or one 1 oz glass of hard liquor (44 mL). Stay at a healthy weight. If you need help, ask  your provider. General instructions  Eat and drink as told. Track your blood pressure at home. Tell your provider about any changes. If you have diabetes, track your blood sugar as told. Exercise at least 30 minutes a day, 5 days a week. Keep your shots (vaccinations) up to date. Keep all follow-up visits. Your provider may need to change your treatments over time. Where to find support American Kid[employer] Fund: [place].com.au Kidney School: kidneyschool.org American Association of Kidney Patients: https://www.miller-montoya.com/ Where to find more information National Kidney Foundation: kidney.org Centers for Disease Control and Prevention. To learn more: Go to Diningcalendar.de. Click Search. Type chronic kidney disease in the search box. Contact a health care provider if: You have new symptoms. You get symptoms of end-stage kidney disease. These include: Headaches. Numbness  in your hands or feet. Leg cramps. Easy bruising. Get help right away if: You have a fever. You make less pee than usual. You have pain or bleeding when you pee or poop. You have chest pain. You have shortness of breath. These symptoms may be an emergency. Call 911 right away. Do not wait to see if the symptoms will go away. Do not drive yourself to the hospital. This information is not intended to replace advice given to you by your health care provider. Make sure you discuss any questions you have with your health care provider. Document Revised: 04/26/2023 Document Reviewed: 12/17/2022 Elsevier Patient Education  2024 [employer].

## 2024-05-28 NOTE — Progress Notes (Signed)
 Subjective: CC: Follow-up hypothyroidism, CKD PCP: Jolinda Norene HERO, DO YEP:Brenda Solis is a 89 y.o. female presenting to clinic today for:  Compliant with all medications.  Reports no changes in bowel habits.  She is on Norco 5 milligrams prescribed 3 times daily but only tries to take it twice daily.  She is wanting to know what she can take midday.  She reports no constipation and has been very compliant with Colace to prevent constipation associated with opioid.  She does report that she is very concerned about becoming dependent on an opioid but is trying to utilize it to keep quality of life.  She continues to see her specialist for spinal stenosis intermittently with last office visit April 25, 2024.  She is compliant with her antidepressant and antianxiety medications and continues to use the 100 mg of trazodone  at bedtime.  She denies any excessive daytime sedation.  She continues to have issues with balance and weakness in the lower extremity but she just notes that comes with the territory referring to her spinal stenosis.  She reports some fluctuations in her mood but overall feels like things are stable with meds.  Her only concern today is that she has got a rash underneath her breast that been present for about 2 weeks now.  She had been utilizing cornstarch to keep the area dry but switched over to cortisone cream and then developed the rash.  She does report some weeping of the area and tenderness.  ROS: Per HPI  Allergies  Allergen Reactions   Gabapentin  Other (See Comments)    Dizzy   Oxycodone  Itching   Tape Other (See Comments)    Skin is very sensitive to adhesive tape.   Past Medical History:  Diagnosis Date   Anemia    Anxiety    Cancer (HCC)    Depression    Heart murmur    Hyperlipidemia    Hypertension    Hypothyroidism    Insomnia, unspecified    Pneumonia    Spondylosis of unspecified site without mention of myelopathy    Thyroid  disease     hypothyroidism    Current Outpatient Medications:    Ascorbic Acid (VITAMIN C PO), Take 1 tablet by mouth daily., Disp: , Rfl:    buPROPion  (WELLBUTRIN  XL) 300 MG 24 hr tablet, TAKE 1 TABLET (300 MG TOTAL) BY MOUTH DAILY. FOR DEPRESSION, Disp: 90 tablet, Rfl: 0   busPIRone  (BUSPAR ) 5 MG tablet, TAKE 1 TABLET BY MOUTH TWICE A DAY AS NEEDED, Disp: 180 tablet, Rfl: 0   CALCIUM  PO, Take 1 tablet by mouth daily., Disp: , Rfl:    Cholecalciferol  (VITAMIN D3 PO), Take 1 tablet by mouth daily., Disp: , Rfl:    docusate sodium  (STOOL SOFTENER) 100 MG capsule, Take 100 mg by mouth 2 (two) times daily., Disp: , Rfl:    DULoxetine  (CYMBALTA ) 60 MG capsule, TAKE 1 CAPSULE (60 MG TOTAL) BY MOUTH DAILY. FOR DEPRESSION AND CHRONIC PAIN, Disp: 90 capsule, Rfl: 1   HYDROcodone -acetaminophen  (NORCO/VICODIN) 5-325 MG tablet, Take 1 tablet by mouth in the morning, at noon, and at bedtime., Disp: , Rfl:    levothyroxine  (SYNTHROID ) 88 MCG tablet, TAKE 1 TABLET EVERY DAY BEFORE BREAKFAST FOR THYROID -, Disp: 90 tablet, Rfl: 3   meclizine  (ANTIVERT ) 25 MG tablet, TAKE ONE TABLET BY MOUTH THREE TIMES DAILY AS NEEDED FOR DIZZINESS, Disp: 30 tablet, Rfl: 2   methocarbamol  (ROBAXIN ) 500 MG tablet, TAKE 1 TABLET BY MOUTH EVERY 8  HOURS AS NEEDED FOR MUSCLE SPASM, Disp: 40 tablet, Rfl: 0   Multiple Vitamin (MULTIVITAMIN WITH MINERALS) TABS tablet, Take 1 tablet by mouth daily., Disp: , Rfl:    traZODone  (DESYREL ) 50 MG tablet, TAKE 1-2 TABLETS (50-100 MG TOTAL) BY MOUTH AT BEDTIME AS NEEDED. 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  Tobacco Use   Smoking status: Never   Smokeless tobacco: Never  Vaping Use   Vaping status: Never Used  Substance and Sexual  Activity   Alcohol use: No    Alcohol/week: 0.0 standard drinks of alcohol   Drug use: No   Sexual activity: Not Currently  Other Topics Concern   Not on file  Social History Narrative   Not on file   Social Drivers of Health   Financial Resource Strain: Low Risk  (11/22/2023)   Overall Financial Resource Strain (CARDIA)    Difficulty of Paying Living Expenses: Not very hard  Food Insecurity: No Food Insecurity (11/22/2023)   Hunger Vital Sign    Worried About Running Out of Food in the Last Year: Never true    Ran Out of Food in the Last Year: Never true  Transportation Needs: No Transportation Needs (11/22/2023)   PRAPARE - Administrator, Civil Service (Medical): No    Lack of Transportation (Non-Medical): No  Physical Activity: Inactive (11/22/2023)   Exercise Vital Sign    Days of Exercise per Week: 0 days    Minutes of Exercise per Session: 0 min  Stress: Stress Concern Present (11/22/2023)   [employer] of Occupational Health - Occupational Stress Questionnaire    Feeling of Stress : To some extent  Social Connections: Moderately Integrated (11/22/2023)   Social Connection and Isolation Panel    Frequency of Communication with Friends and Family: More than three times a week    Frequency of Social Gatherings with Friends and Family: Three times a week    Attends Religious Services: More than 4 times per year    Active Member of Clubs or Organizations: Yes    Attends Banker Meetings: 1 to 4 times per year    Marital Status: Widowed  Intimate Partner Violence: Not At Risk (09/05/2023)   Humiliation, Afraid, Rape, and Kick questionnaire    Fear of Current or Ex-Partner: No    Emotionally Abused: No    Physically Abused: No    Sexually Abused: No   Family History  Problem Relation Age of Onset   Breast cancer Mother        breast   Osteoporosis Mother    Heart disease Mother    Heart disease Father    Stroke Father    Coronary artery  disease Brother        41s   Heart disease Brother    Esophageal varices Brother    Osteoporosis Maternal Aunt    Stroke Other        FAMILY   Esophageal varices Other        FAMILY   Colon cancer Neg Hx    Esophageal cancer Neg Hx    Pancreatic cancer Neg Hx    Stomach cancer Neg Hx  Objective: Office vital signs reviewed. BP 136/63   Pulse 85   Temp 97.6 F (36.4 C)   Ht 5' 7 (1.702 m)   Wt 189 lb 2 oz (85.8 kg)   SpO2 95%   BMI 29.62 kg/m   Physical Examination:  General: Awake, alert, well nourished, No acute distress HEENT: No exophthalmos.  No goiter Cardio: regular rate and rhythm, S1S2 heard, no murmurs appreciated Pulm: clear to auscultation bilaterally, no wheezes, rhonchi or rales; normal work of breathing on room air Breasts: Pendulous.  She has a macerated, hyperemic rash underneath bilateral breasts.  No vesicles or exudates appreciated MSK: Slow, antalgic gait that is unsteady.  Utilizing cane for ambulation     05/28/2024    1:40 PM 11/23/2023   10:34 AM 09/05/2023   10:31 AM  Depression screen PHQ 2/9  Decreased Interest 0 0 0  Down, Depressed, Hopeless 0 0 1  PHQ - 2 Score 0 0 1  Altered sleeping 0 0 0  Tired, decreased energy 0 1 3  Change in appetite 0 0 0  Feeling bad or failure about yourself  0 0 1  Trouble concentrating 0 0 1  Moving slowly or fidgety/restless 3 0 0  Suicidal thoughts 0 0 0  PHQ-9 Score 3 1  6    Difficult doing work/chores Very difficult Somewhat difficult      Data saved with a previous flowsheet row definition      05/28/2024    1:40 PM 11/23/2023   10:34 AM 05/23/2023    3:09 PM 05/23/2023    3:01 PM  GAD 7 : Generalized Anxiety Score  Nervous, Anxious, on Edge 0 0 1 0  Control/stop worrying 0 0 1 0  Worry too much - different things 0 0 1 0  Trouble relaxing 0 0 0 0  Restless 0 0 0 0  Easily annoyed or irritable 0 0 0 0  Afraid - awful might happen 0 0 0 0  Total GAD 7 Score 0 0 3 0  Anxiety Difficulty  Not difficult at all Not difficult at all Not difficult at all Not difficult at all    Assessment/ Plan: 89 y.o. female   Acquired hypothyroidism - Plan: TSH + free T4  Stage 3a chronic kidney disease (HCC) - Plan: Renal Function Panel  Candidal intertrigo - Plan: nystatin  cream (MYCOSTATIN )  Spinal stenosis, lumbar region, with neurogenic claudication  Reactive depression   Clinically euthyroid.  Check thyroid  levels  Check renal function.  Continue to avoid NSAIDs.  Okay to use midday high-dose Tylenol  in place of Norco if she desires  Rash consistent with candidal intertrigo.  Nystatin  cream ordered.  Discussed utilization of dry wick bras versus cotton bras and keeping the area as dry as possible.  Avoid use of topical corticosteroids  Sadly, continues to have quite a bit of issues with spinal stenosis of the lumbar spine.  Treated with opioid.  Right now no red flag signs or symptoms.  Discussed some strengthening and balance exercises that she can do at home.  Glad to place referral to physical therapy should she desire it in the future  Depression reactive but stable with current meds.  Offered counseling services but she declined   Norene CHRISTELLA Fielding, DO [facility] [phone]

## 2024-05-29 ENCOUNTER — Ambulatory Visit: Payer: Self-pay | Admitting: Family Medicine

## 2024-05-29 LAB — RENAL FUNCTION PANEL
Albumin: 4.5 g/dL (ref 3.7–4.7)
BUN/Creatinine Ratio: 18 (ref 12–28)
BUN: 20 mg/dL (ref 8–27)
CO2: 22 mmol/L (ref 20–29)
Calcium: 10 mg/dL (ref 8.7–10.3)
Chloride: 103 mmol/L (ref 96–106)
Creatinine, Ser: 1.12 mg/dL — ABNORMAL HIGH (ref 0.57–1.00)
Glucose: 103 mg/dL — ABNORMAL HIGH (ref 70–99)
Phosphorus: 2.9 mg/dL — ABNORMAL LOW (ref 3.0–4.3)
Potassium: 5.1 mmol/L (ref 3.5–5.2)
Sodium: 140 mmol/L (ref 134–144)
eGFR: 48 mL/min/1.73 — ABNORMAL LOW (ref >59)

## 2024-05-29 LAB — TSH+FREE T4
Free T4: 1.3 ng/dL (ref 0.82–1.77)
TSH: 2.61 u[IU]/mL (ref 0.450–4.500)

## 2024-06-05 ENCOUNTER — Other Ambulatory Visit: Payer: Self-pay | Admitting: Family Medicine

## 2024-06-07 ENCOUNTER — Other Ambulatory Visit

## 2024-06-09 ENCOUNTER — Other Ambulatory Visit: Payer: Self-pay | Admitting: Family Medicine

## 2024-06-09 DIAGNOSIS — F32A Depression, unspecified: Secondary | ICD-10-CM

## 2024-07-17 ENCOUNTER — Telehealth: Payer: Self-pay | Admitting: Family Medicine

## 2024-07-17 DIAGNOSIS — C182 Malignant neoplasm of ascending colon: Secondary | ICD-10-CM

## 2024-07-17 NOTE — Telephone Encounter (Signed)
 Copied from CRM [id number]. Topic: Referral - Request for Referral >> Jul 17, 2024 11:09 AM Brenda Solis wrote: Did the patient discuss referral with their provider in the last year? No (If No - schedule appointment) (If Yes - send message)  Appointment offered? No- office calling  Type of order/referral and detailed reason for visit: follow up visit with [facility]  Preference of office, provider, location: [facility]- DR Bernarda Ned NPR- [phone]   diagnosis- [phone]  If referral order, have you been seen by this specialty before? Yes (If Yes, this issue or another issue? When? Where?  Can we respond through MyChart? No >> Jul 17, 2024 11:15 AM Brenda Solis wrote: Please fax them the referral - from [facility] website  [phone]. Attention to incoming referrals.

## 2024-07-17 NOTE — Telephone Encounter (Signed)
 Referral placed for [facility] for [employer]

## 2024-08-01 ENCOUNTER — Other Ambulatory Visit: Payer: Self-pay | Admitting: Family Medicine

## 2024-08-02 ENCOUNTER — Other Ambulatory Visit: Payer: Self-pay | Admitting: Family Medicine

## 2024-08-02 DIAGNOSIS — F419 Anxiety disorder, unspecified: Secondary | ICD-10-CM

## 2024-08-02 DIAGNOSIS — M48062 Spinal stenosis, lumbar region with neurogenic claudication: Secondary | ICD-10-CM

## 2024-08-02 DIAGNOSIS — F32A Depression, unspecified: Secondary | ICD-10-CM

## 2024-09-05 ENCOUNTER — Ambulatory Visit: Payer: Self-pay

## 2024-12-04 ENCOUNTER — Encounter: Admitting: Family Medicine
